# Patient Record
Sex: Male | Born: 1960 | Race: White | Hispanic: No | Smoking: Never smoker
Health system: Southern US, Community
[De-identification: ages and names within clinical notes are randomized; demographics above are authoritative.]

## PROBLEM LIST (undated history)

## (undated) DIAGNOSIS — Z6841 Body Mass Index (BMI) 40.0 and over, adult: Secondary | ICD-10-CM

## (undated) DIAGNOSIS — Z8489 Family history of other specified conditions: Secondary | ICD-10-CM

## (undated) DIAGNOSIS — Z87442 Personal history of urinary calculi: Secondary | ICD-10-CM

## (undated) DIAGNOSIS — M1990 Unspecified osteoarthritis, unspecified site: Secondary | ICD-10-CM

## (undated) DIAGNOSIS — L0390 Cellulitis, unspecified: Secondary | ICD-10-CM

## (undated) DIAGNOSIS — E119 Type 2 diabetes mellitus without complications: Secondary | ICD-10-CM

## (undated) DIAGNOSIS — I82409 Acute embolism and thrombosis of unspecified deep veins of unspecified lower extremity: Secondary | ICD-10-CM

## (undated) DIAGNOSIS — R202 Paresthesia of skin: Secondary | ICD-10-CM

## (undated) DIAGNOSIS — R011 Cardiac murmur, unspecified: Secondary | ICD-10-CM

## (undated) DIAGNOSIS — G4730 Sleep apnea, unspecified: Secondary | ICD-10-CM

## (undated) DIAGNOSIS — R200 Anesthesia of skin: Secondary | ICD-10-CM

## (undated) DIAGNOSIS — D649 Anemia, unspecified: Secondary | ICD-10-CM

## (undated) DIAGNOSIS — C801 Malignant (primary) neoplasm, unspecified: Secondary | ICD-10-CM

## (undated) DIAGNOSIS — J189 Pneumonia, unspecified organism: Secondary | ICD-10-CM

## (undated) DIAGNOSIS — M2550 Pain in unspecified joint: Secondary | ICD-10-CM

## (undated) HISTORY — PX: TONSILLECTOMY: SUR1361

## (undated) HISTORY — PX: JOINT REPLACEMENT: SHX530

## (undated) HISTORY — DX: Malignant (primary) neoplasm, unspecified: C80.1

## (undated) HISTORY — PX: BACK SURGERY: SHX140

## (undated) HISTORY — PX: INGUINAL HERNIA REPAIR: SUR1180

## (undated) HISTORY — PX: TOTAL HIP ARTHROPLASTY: SHX124

## (undated) HISTORY — PX: HERNIA REPAIR: SHX51

## (undated) HISTORY — DX: Type 2 diabetes mellitus without complications: E11.9

## (undated) HISTORY — PX: SPINE SURGERY: SHX786

## (undated) HISTORY — PX: COLONOSCOPY: SHX174

---
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
## 1998-05-01 HISTORY — PX: OTHER SURGICAL HISTORY: SHX169

## 2003-05-02 HISTORY — PX: LUMBAR LAMINECTOMY: SHX95

## 2006-11-04 ENCOUNTER — Inpatient Hospital Stay (HOSPITAL_COMMUNITY): Admission: EM | Admit: 2006-11-04 | Discharge: 2006-11-07 | Payer: Self-pay | Admitting: Emergency Medicine

## 2010-09-13 NOTE — Discharge Summary (Signed)
Henry Hinton, Henry Hinton                ACCOUNT NO.:  [id number]   MEDICAL RECORD NO.:  [id number]          PATIENT TYPE:  INP   LOCATION:  5037                         FACILITY:  [facility]   PHYSICIAN:  Hind I Elsaid, MD      DATE OF BIRTH:  01-17-1961   DATE OF ADMISSION:  11/04/2006  DATE OF DISCHARGE:  11/07/2006                               DISCHARGE SUMMARY   DISCHARGE DIAGNOSES:  1. Left leg cellulitis.  2. Status post right hip placement.  3. L4-L5 laminectomy in February of 2005.  4. Sleep apnea on CPAP.   DISCHARGE MEDICATIONS:  1. Keflex 500 mg p.o. q.12 hours.  2. Oxycodone 5 to 10 mg p.o. q.6 hours p.r.n.  3. Ferrous sulfate 325 mg p.o. b.i.d.  4. Aspirin 81 mg p.o. daily.   PROCEDURES:  None.   CONSULTATIONS:  None.   BRIEF HISTORY:  You can review the history done by Dr. Karilyn Cota at the  date of admission on November 04, 2006 where patient admitted for left leg  redness and inflammation.   HOSPITAL COURSE:  1. For left leg cellulitis, patient was admitted, started on IV      antibiotics and Lovenox.  PT was consulted and OT where they      recommend patient to continue his home physical activity.  During      hospitalization, left leg cellulitis improved significantly.  We      think patient can be discharged home with Keflex, and patient was      advised to follow up with his primary care; if further swelling,      erythema, back to the hospital.  2. Sleep apnea.  To continue his sleep CPAP machine.   VITAL SIGNS ON DISCHARGE:  Temperature 97.5, pulse rate 74, blood  pressure 118/61.   Sodium of 141, potassium of 4.3, chloride 108, bicarb of 28, BUN of 12  and creatinine of 0.5.  White blood cells of 5.3, hemoglobin of 9.3,  hematocrit 28 and platelets 232.  Hemoglobin A1c was 5.6.   We think patient is medically stable for discharge home and follow with  his primary care as needed.     [name], MD  Electronically Signed    HIE/MEDQ  D:  11/07/2006  T:   11/07/2006  Job:  [id number]

## 2010-09-13 NOTE — H&P (Signed)
NAME:  Henry Hinton, Henry Hinton NO.:  [id number]   MEDICAL RECORD NO.:  [id number]          PATIENT TYPE:  EMS   LOCATION:  MAJO                         FACILITY:  [facility]   PHYSICIAN:  Wilson Singer, M.D.DATE OF BIRTH:  February 12, 1961   DATE OF ADMISSION:  11/04/2006  DATE OF DISCHARGE:                              HISTORY & PHYSICAL   HISTORY OF PRESENT ILLNESS:  This is a very pleasant 50 year old man who  complains of left leg redness and inflammation which started today.  He  also has had a low-grade fever while he was in the emergency room.  He  denies any insect bite or anything else he noticed.  Apparently he has  had cellulitis previously on his legs.  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   PAST MEDICAL HISTORY:  No serious illnesses.  Sleep apnea for which he  has a CPAP machine.   PAST SURGICAL HISTORY:  Right total hip replacement as mentioned above.  L4, L5 laminectomy in February 2005.  Left wrist tendonectomy in 2001.  Tonsillectomy as a child.   SOCIAL HISTORY:  He is separated and lives alone.  He is currently  living with his parents apparently.  He does not smoke, and he  occasionally drinks alcohol.  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   MEDICATIONS:  Colace 100 mg b.i.d., oxycodone 5 to 10 mg approximately  twice a day as needed, ferrous sulfate 325 mg b.i.d., aspirin 81 mg  daily.   ALLERGIES:  NONE KNOWN.   FAMILY HISTORY:  Noncontributory.   REVIEW OF SYSTEMS:  Apart from the symptoms mentioned above, there are  no other symptoms referable to all systems reviewed.   PHYSICAL EXAMINATION:  VITAL SIGNS:  Temperature 100.5, blood pressure  132/78, pulse 80, respiration rate 12, saturation 100%.  GENERAL:  He is morbidly obese.  He is nontoxic.  CARDIOVASCULAR:  Heart sounds are present and normal without murmurs.  RESPIRATORY:  Lung  fields are clear.  ABDOMEN:  Soft, nontender with no hepatosplenomegaly.  NEUROLOGIC:  He is alert and oriented with no focal neurological signs.  EXTREMITIES:  Left leg appears to show a spreading type of cellulitis on  the left medial aspect of the leg around the left knee joint area.  It  is located above and below the knee joint on the medial aspect.  The  mother, who came with him, was concerned that it was spreading.   INVESTIGATIONS:  Sodium 138, potassium 3.9, chloride 106, BUN 17,  creatinine 0.8, glucose 102.  Hemoglobin 10.3, white blood cell count  7.4, platelets 283.   IMPRESSION:  1. Left leg cellulitis.  2. Status post right total hip replacement.  3. Sleep apnea.   PLAN:  1. Admit.  2. Intravenous antibiotics.  3. Lovenox and physical therapy which he had been having in      rehabilitation after his right hip surgery.  We will continue this.  4. CPAP machine for his sleep  apnea which he will obtain from home and      use while in the hospital.  5. Further recommendations will depend on the patient's hospital      progress.      Wilson Singer, M.D.  Electronically Signed     NCG/MEDQ  D:  11/04/2006  T:  11/04/2006  Job:  [id number]

## 2011-02-14 LAB — I-STAT 8, (EC8 V) (CONVERTED LAB)
BUN: 17
Bicarbonate: 26.2 — ABNORMAL HIGH
Glucose, Bld: 102 — ABNORMAL HIGH
Hemoglobin: 10.9 — ABNORMAL LOW
Sodium: 138
TCO2: 27
pCO2, Ven: 41.8 — ABNORMAL LOW
pH, Ven: 7.405 — ABNORMAL HIGH

## 2011-02-14 LAB — DIFFERENTIAL
Basophils Absolute: 0.1
Basophils Relative: 1
Eosinophils Absolute: 0.1
Eosinophils Relative: 2
Monocytes Absolute: 0.5
Monocytes Relative: 7
Neutro Abs: 4.9

## 2011-02-14 LAB — COMPREHENSIVE METABOLIC PANEL
AST: 16
Albumin: 2.4 — ABNORMAL LOW
Alkaline Phosphatase: 100
BUN: 11
CO2: 29
Chloride: 105
GFR calc non Af Amer: >60
Potassium: 4.2
Total Bilirubin: 0.4

## 2011-02-14 LAB — CBC
HCT: 26.9 — ABNORMAL LOW
HCT: 30.7 — ABNORMAL LOW
Hemoglobin: 10.3 — ABNORMAL LOW
MCHC: 33.4
MCV: 82.8
MCV: 82.8
Platelets: 222
Platelets: 232
RBC: 3.25 — ABNORMAL LOW
RBC: 3.71 — ABNORMAL LOW
RDW: 14.1 — ABNORMAL HIGH
RDW: 14.7 — ABNORMAL HIGH
WBC: 5.3
WBC: 5.4

## 2011-02-14 LAB — BASIC METABOLIC PANEL
BUN: 12
Calcium: 8.2 — ABNORMAL LOW
GFR calc non Af Amer: >60
Glucose, Bld: 111 — ABNORMAL HIGH

## 2011-02-14 LAB — POCT I-STAT CREATININE: Creatinine, Ser: 0.8

## 2011-04-19 ENCOUNTER — Ambulatory Visit (INDEPENDENT_AMBULATORY_CARE_PROVIDER_SITE_OTHER): Payer: BC Managed Care – PPO

## 2011-04-19 DIAGNOSIS — J111 Influenza due to unidentified influenza virus with other respiratory manifestations: Secondary | ICD-10-CM

## 2011-04-19 DIAGNOSIS — D649 Anemia, unspecified: Secondary | ICD-10-CM

## 2012-01-12 ENCOUNTER — Encounter: Payer: Self-pay | Admitting: Family Medicine

## 2012-01-12 ENCOUNTER — Ambulatory Visit (INDEPENDENT_AMBULATORY_CARE_PROVIDER_SITE_OTHER): Payer: BC Managed Care – PPO | Admitting: Family Medicine

## 2012-01-12 VITALS — BP 130/73 | HR 65 | Temp 97.8°F | Resp 16 | Ht 68.0 in | Wt 382.6 lb

## 2012-01-12 DIAGNOSIS — R252 Cramp and spasm: Secondary | ICD-10-CM

## 2012-01-12 DIAGNOSIS — R030 Elevated blood-pressure reading, without diagnosis of hypertension: Secondary | ICD-10-CM

## 2012-01-12 DIAGNOSIS — G4733 Obstructive sleep apnea (adult) (pediatric): Secondary | ICD-10-CM

## 2012-01-12 DIAGNOSIS — R609 Edema, unspecified: Secondary | ICD-10-CM

## 2012-01-12 DIAGNOSIS — R600 Localized edema: Secondary | ICD-10-CM

## 2012-01-12 MED ORDER — POTASSIUM CHLORIDE ER 10 MEQ PO TBCR: 10.0000 meq | EXTENDED_RELEASE_TABLET | Freq: Every day | ORAL | Status: DC

## 2012-01-12 MED ORDER — FUROSEMIDE 40 MG PO TABS: 40.0000 mg | ORAL_TABLET | Freq: Every day | ORAL | Status: DC

## 2012-01-12 NOTE — Progress Notes (Signed)
  Subjective:    Patient ID: Henry Quam., male    DOB: [birth year], 51 y.o.   MRN: [id number]  HPI Henry Hinton. is a 51 y.o. male  Takes Lasix 40mg  qd.  No known hx HTN or CHF. Takes this for swelling in legs, takes potassium due to Lasix. Treated by Dr. Sudie Bailey in [place].  Last ov in April.  Transferring care.  Just filled out release of info today. Unknown why fluid sits in legs.  Has been on lasix for 5 years. No cardiologist.   Obesity - deep water exercise in pool every day.  ATC.  Usually weighs 362.  NO shortness of breath.  Does not feel more swelling than usual.  Cramping at night - few times per week - various areas of legs - notices if not drinking enough water. Legs feel lighter with exercise.   Usually blood pressure runs lower than here today - usually 120's over low 80's.  No hx of HTN.   Hx of OSA - uses CPAP.    Father passed away 10/05/2022- prostate cancer.  ATC for [facility].    Review of Systems  Constitutional: Negative for fever and chills.  Respiratory: Negative for chest tightness and shortness of breath.   Cardiovascular: Positive for leg swelling (chronic - less now than usual.). Negative for chest pain.  Musculoskeletal: Positive for myalgias.       Objective:   Physical Exam  Constitutional: He is oriented to person, place, and time. He appears well-developed and well-nourished.       overweight  HENT:  Head: Normocephalic and atraumatic.  Eyes: EOM are normal. Pupils are equal, round, and reactive to light.  Neck: No JVD present. Carotid bruit is not present.  Cardiovascular: Normal rate, regular rhythm and normal heart sounds.   No murmur heard. Pulmonary/Chest: Effort normal and breath sounds normal. He has no wheezes. He has no rales.  Musculoskeletal: He exhibits edema (le bilaterally - nonpitting. ).  Neurological: He is alert and oriented to person, place, and time.  Skin: Skin is warm and dry.  Psychiatric: He has a normal mood and  affect. His behavior is normal.        Assessment & Plan:  Henry Hinton. is a 51 y.o. male 1. Leg edema  furosemide (LASIX) 40 MG tablet, potassium chloride (K-DUR) 10 MEQ tablet  2. Muscle cramps  Basic metabolic panel  3. OSA (obstructive sleep apnea)      Recheck BP in normal range.  Release of info completed for previous records. Refilled lasix and k dur.  episodic myalgias - check BMP.  Maintain adequate hydration.

## 2012-01-12 NOTE — Patient Instructions (Signed)
Keep a record of your blood pressures outside of the office and bring them to the next office visit. We will try to obtain your prior records to review at the next office visit. Your should receive a call or letter about your lab results within the next week to 10 days.  Follow up in the next 1 month.

## 2012-01-13 LAB — BASIC METABOLIC PANEL
CO2: 26 mEq/L (ref 19–32)
Calcium: 8.8 mg/dL (ref 8.4–10.5)
Chloride: 104 mEq/L (ref 96–112)
Glucose, Bld: 90 mg/dL (ref 70–99)
Potassium: 4.2 mEq/L (ref 3.5–5.3)
Sodium: 138 mEq/L (ref 135–145)

## 2012-02-05 ENCOUNTER — Ambulatory Visit (INDEPENDENT_AMBULATORY_CARE_PROVIDER_SITE_OTHER): Payer: BC Managed Care – PPO | Admitting: Family Medicine

## 2012-02-05 ENCOUNTER — Encounter: Payer: Self-pay | Admitting: Family Medicine

## 2012-02-05 VITALS — BP 120/84 | HR 67 | Temp 98.2°F | Resp 16 | Ht 67.0 in | Wt 378.0 lb

## 2012-02-05 DIAGNOSIS — E669 Obesity, unspecified: Secondary | ICD-10-CM

## 2012-02-05 DIAGNOSIS — G4733 Obstructive sleep apnea (adult) (pediatric): Secondary | ICD-10-CM

## 2012-02-05 DIAGNOSIS — R252 Cramp and spasm: Secondary | ICD-10-CM

## 2012-02-05 NOTE — Progress Notes (Signed)
  Subjective:    Patient ID: Henry Quam., male    DOB: 1960/07/02, 51 y.o.   MRN: [id number]  HPI Henry Kardell. is a 52 y.o. male  New pt as of 01/12/12. Prior pt of Dr. Sudie Bailey in [place]. ATC for [facility].   Hx of leg edema. Takes Lasix 40mg  qd. No known hx HTN or CHF.  takes potassium due to Lasix.  Has been on lasix for 5 years. No cardiologist. cramps in legs at last ov, but bmp in normal range. Cramping at night - few times per week - various areas of legs - notices if not drinking enough water. Legs feel lighter with exercise.  BP still in 120' over 80's.  Doing better - drinking glass of water each night. Not having cramps now.   Obesity - deep water exercise in pool every day. ATC. Usually weighs 362. NO shortness of breath. Does not feel more swelling than usual. Not eating over 1500 calories each day.  Running in pool.  Feels like clothing is looser.  Low 360's at home.  Goal of 300 next summer. Then goal of mid 200's by retirement.  May have had thyroid check few years ago.  Not cold natured.  No change in dryness of skin or hair falling out. Has looked into gastric bypass in past. Rather change eating habits now, may be amenable to this in few years.  Feels like more active now than few years ago.    OSA -- uses cpap.  Initial bp high last ov, but wnl on recheck.    Review of Systems  Constitutional: Negative for fever, chills and unexpected weight change.  Respiratory: Negative for chest tightness and shortness of breath.   Cardiovascular: Negative for chest pain (sore in muscles after workouts only. ).  Skin: Negative for color change.       Objective:   Physical Exam  Constitutional: He is oriented to person, place, and time. He appears well-developed and well-nourished.       Overweight/obese.  Neck: No thyromegaly present.  Cardiovascular: Normal rate, regular rhythm, normal heart sounds and intact distal pulses.   Pulmonary/Chest: Effort normal and breath  sounds normal. No respiratory distress. He has no wheezes. He has no rales.  Neurological: He is alert and oriented to person, place, and time.  Skin: Skin is warm and dry.  Psychiatric: He has a normal mood and affect. His behavior is normal.          Assessment & Plan:  Henry Hinton. is a 51 y.o. male 1. Obesity  TSH  2. Muscle cramps    3. OSA on CPAP      Obesity - working on diet and exercise.  Doubt hypothyroid, but with trouble with weight loss will check tsh.  Discussed nutritionist - declined at present but may be open to this after 1st of the year.   Myalgias - resolved.  Continue to maintain adequate hydration.  OSA - stable.  BP looks ok today.   Patient Instructions  Your should receive a call or letter about your lab results within the next week to 10 days.  Follow up in about 3 months Return to the clinic or go to the nearest emergency room if any of your symptoms worsen or new symptoms occur.

## 2012-02-05 NOTE — Patient Instructions (Signed)
Your should receive a call or letter about your lab results within the next week to 10 days.  Follow up in about 3 months Return to the clinic or go to the nearest emergency room if any of your symptoms worsen or new symptoms occur.

## 2012-02-06 LAB — TSH: TSH: 1.013 u[IU]/mL (ref 0.350–4.500)

## 2012-04-08 ENCOUNTER — Ambulatory Visit (INDEPENDENT_AMBULATORY_CARE_PROVIDER_SITE_OTHER): Payer: BC Managed Care – PPO | Admitting: Internal Medicine

## 2012-04-08 VITALS — BP 124/82 | HR 76 | Temp 97.7°F | Resp 20 | Ht 69.0 in | Wt 379.6 lb

## 2012-04-08 DIAGNOSIS — Z96649 Presence of unspecified artificial hip joint: Secondary | ICD-10-CM

## 2012-04-08 DIAGNOSIS — R609 Edema, unspecified: Secondary | ICD-10-CM

## 2012-04-08 DIAGNOSIS — R300 Dysuria: Secondary | ICD-10-CM

## 2012-04-08 LAB — POCT UA - MICROSCOPIC ONLY: Yeast, UA: NEGATIVE

## 2012-04-08 LAB — POCT URINALYSIS DIPSTICK
Leukocytes, UA: NEGATIVE
Nitrite, UA: NEGATIVE
Protein, UA: NEGATIVE
Urobilinogen, UA: 0.2

## 2012-04-08 MED ORDER — NITROFURANTOIN MONOHYD MACRO 100 MG PO CAPS: 100.0000 mg | ORAL_CAPSULE | Freq: Two times a day (BID) | ORAL | Status: DC

## 2012-04-08 NOTE — Progress Notes (Signed)
  Subjective:    Patient ID: Henry Quam., male    DOB: 1960-08-29, 51 y.o.   MRN: [id number]  HPI having dysuria throughout the stream For the past week/no frequency or urgency/nocturia only once/no trouble starting stream/no incomplete void/no history of prostate problems No hematuria no penile discharge/last sexual activity 3 years ago No skin rashes or penile lesions/  No fever or flank pain  Recently moved here to care for  ailing father Swims daily and runs in the water in an attempt to stay in shape  Review of Systems     Objective:   Physical Exam Morbidly obese Vital signs otherwise stable No flank tenderness No abdominal tenderness No scrotal lesions and no penile lesions/no penile discharge/no irritation at the meatus       Results for orders placed in visit on 04/08/12  POCT URINALYSIS DIPSTICK      Component Value Range   Color, UA yellow     Clarity, UA clear     Glucose, UA neg     Bilirubin, UA neg     Ketones, UA neg     Spec Grav, UA 1.020     Blood, UA neg     pH, UA 7.0     Protein, UA neg     Urobilinogen, UA 0.2     Nitrite, UA neg     Leukocytes, UA Negative    POCT UA - MICROSCOPIC ONLY      Component Value Range   WBC, Ur, HPF, POC 0-1     RBC, urine, microscopic neg     Bacteria, U Microscopic neg     Mucus, UA neg     Epithelial cells, urine per micros 0-1     Crystals, Ur, HPF, POC neg     Casts, Ur, LPF, POC neg     Yeast, UA neg      Assessment & Plan:  Problem #1 dysuria for one week Culture Start Macrobid (he's convinced  there is a low-grade infection) He agrees to further workup if his symptoms do not resolve over the next week

## 2012-04-08 NOTE — Patient Instructions (Signed)
Culture pending

## 2012-04-10 LAB — URINE CULTURE: Colony Count: 5000

## 2012-04-11 ENCOUNTER — Other Ambulatory Visit: Payer: Self-pay | Admitting: Family Medicine

## 2012-07-25 ENCOUNTER — Other Ambulatory Visit: Payer: Self-pay | Admitting: Physician Assistant

## 2012-11-02 ENCOUNTER — Other Ambulatory Visit: Payer: Self-pay | Admitting: Internal Medicine

## 2012-11-05 ENCOUNTER — Other Ambulatory Visit: Payer: Self-pay | Admitting: Internal Medicine

## 2012-11-12 ENCOUNTER — Encounter: Payer: Self-pay | Admitting: Sports Medicine

## 2012-11-12 ENCOUNTER — Ambulatory Visit (INDEPENDENT_AMBULATORY_CARE_PROVIDER_SITE_OTHER): Payer: BC Managed Care – PPO | Admitting: Sports Medicine

## 2012-11-12 ENCOUNTER — Other Ambulatory Visit: Payer: Self-pay | Admitting: Sports Medicine

## 2012-11-12 ENCOUNTER — Ambulatory Visit
Admission: RE | Admit: 2012-11-12 | Discharge: 2012-11-12 | Disposition: A | Discharge status: Home / Self Care | Payer: BC Managed Care – PPO | Source: Ambulatory Visit | Attending: Sports Medicine | Admitting: Sports Medicine

## 2012-11-12 VITALS — BP 144/99 | HR 62 | Ht 69.5 in | Wt 362.0 lb

## 2012-11-12 DIAGNOSIS — M542 Cervicalgia: Secondary | ICD-10-CM

## 2012-11-12 MED ORDER — GABAPENTIN 100 MG PO CAPS: ORAL_CAPSULE | ORAL | Status: DC

## 2012-11-12 MED ORDER — PREDNISONE 10 MG PO TABS: ORAL_TABLET | ORAL | Status: DC

## 2012-11-13 NOTE — Progress Notes (Signed)
  Subjective:    Patient ID: Henry Quam., male    DOB: [birth year], 52 y.o.   MRN: [id number]  HPI chief complaint: Right arm and neck pain  Right-hand-dominant 52 year old male comes in today complaining 2 months of right arm pain and numbness. His symptoms began acutely when while stretching his neck he felt a pop along the right side. Shortly afterwards he began to experience numbness in his fourth and fifth fingers and a diffuse ache in the right arm which he traces down the posterior arm into the forearm and hand. The numbness has now moved and involves most of his fingers. The pain is described as a constant dull ache. He is not noticed any significant weakness but does notice that the arm fatigues rather quickly with activity. He's getting some spasm and stiffness along the right side of his neck. He's tried over-the-counter anti-inflammatories without any relief. He denies problems similar to this in the past. No symptoms in the left arm. He notes that raising the arm directly overhead will help alleviate his symptoms. No prior neck surgeries. No deep-seated shoulder pain.  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 Awake alert and oriented x3. Vital signs are reviewed.  Cervical spine: Good cervical mobility in all planes. No tenderness along cervical midline. There is tenderness as well as some mild spasm of the right paraspinal musculature. Positive Spurling to the right. Patient has 4+/5 strength with resisted triceps, wrist flexion, and inner osseous muscle testing on the right compared to 5/5 on the left. No atrophy. Sensation is decreased to light touch along C7 and C8 dermatomes. Good radial and ulnar pulses bilaterally.  X-rays  of his cervical spine and clicking AP and lateral views are reviewed. There is a small anterior spur off the C2 vertebral body but otherwise there appears to be fairly well-preserved disc space. Nothing acute.       Assessment & Plan:  1. 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 6 day Sterapred Dosepak to take history. Neurontin 100 mg to take each bedtime weaning up to 300 mg each bedtime over a period of one week. I will call him with the MRI once available at which point we will delineate further treatment.

## 2012-11-17 ENCOUNTER — Ambulatory Visit
Admission: RE | Admit: 2012-11-17 | Discharge: 2012-11-17 | Disposition: A | Discharge status: Home / Self Care | Payer: BC Managed Care – PPO | Source: Ambulatory Visit | Attending: Sports Medicine | Admitting: Sports Medicine

## 2012-11-17 DIAGNOSIS — M542 Cervicalgia: Secondary | ICD-10-CM

## 2012-11-18 ENCOUNTER — Telehealth: Payer: Self-pay | Admitting: Sports Medicine

## 2012-11-18 NOTE — Telephone Encounter (Signed)
I spoke with the patient on the phone today regarding MRI findings of the cervical spine. 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 There is a caudally extruded component which also causes some mild spinal stenosis with mild spinal cord mass effect but no spinal cord signal abnormality. Based on these findings I think the patient would be best served by a neurosurgeon. I will try to get him into see Dr. Danielle Dess. I'll defer further treatment to Dr. Verlee Rossetti discretion.

## 2012-12-31 ENCOUNTER — Other Ambulatory Visit: Payer: Self-pay | Admitting: Neurological Surgery

## 2013-01-14 ENCOUNTER — Encounter (HOSPITAL_COMMUNITY): Payer: Self-pay | Admitting: Pharmacy Technician

## 2013-01-15 ENCOUNTER — Encounter (HOSPITAL_COMMUNITY): Payer: Self-pay

## 2013-01-15 ENCOUNTER — Encounter (HOSPITAL_COMMUNITY)
Admission: RE | Admit: 2013-01-15 | Discharge: 2013-01-15 | Disposition: A | Discharge status: Home / Self Care | Payer: BC Managed Care – PPO | Source: Ambulatory Visit | Attending: Neurological Surgery | Admitting: Neurological Surgery

## 2013-01-15 DIAGNOSIS — Z01812 Encounter for preprocedural laboratory examination: Secondary | ICD-10-CM | POA: Insufficient documentation

## 2013-01-15 DIAGNOSIS — Z01818 Encounter for other preprocedural examination: Secondary | ICD-10-CM | POA: Insufficient documentation

## 2013-01-15 HISTORY — DX: Cardiac murmur, unspecified: R01.1

## 2013-01-15 HISTORY — DX: Paresthesia of skin: R20.2

## 2013-01-15 HISTORY — DX: Family history of other specified conditions: Z84.89

## 2013-01-15 HISTORY — DX: Pain in unspecified joint: M25.50

## 2013-01-15 HISTORY — DX: Pneumonia, unspecified organism: J18.9

## 2013-01-15 HISTORY — DX: Anemia, unspecified: D64.9

## 2013-01-15 HISTORY — DX: Sleep apnea, unspecified: G47.30

## 2013-01-15 HISTORY — DX: Unspecified osteoarthritis, unspecified site: M19.90

## 2013-01-15 HISTORY — DX: Paresthesia of skin: R20.0

## 2013-01-15 LAB — BASIC METABOLIC PANEL
BUN: 22 mg/dL (ref 6–23)
Calcium: 9.5 mg/dL (ref 8.4–10.5)
Creatinine, Ser: 0.75 mg/dL (ref 0.50–1.35)
GFR calc Af Amer: >90 mL/min (ref >90)
GFR calc non Af Amer: >90 mL/min (ref >90)
Potassium: 4.9 mEq/L (ref 3.5–5.1)

## 2013-01-15 LAB — SURGICAL PCR SCREEN: Staphylococcus aureus: NEGATIVE

## 2013-01-15 LAB — CBC
HCT: 42.6 % (ref 39.0–52.0)
MCHC: 33.8 g/dL (ref 30.0–36.0)
MCV: 86.4 fL (ref 78.0–100.0)
RDW: 14.2 % (ref 11.5–15.5)

## 2013-01-15 NOTE — Progress Notes (Signed)
Pt denies SOB, chest pain, and being under the care of a cardiologist. Pt denies having a stress test, echo, and cardiac cath. Pt made aware to bring in CPAP mask on DOS along with CPAP settings. Pt denies having a cardiac history and cardiac valve replacement.

## 2013-01-15 NOTE — Pre-Procedure Instructions (Addendum)
Henry Hinton.  01/15/2013   Your procedure is scheduled on: Tuesday, January 21, 2013  Report to [facility] at 7:30 AM.  Call this number if you have problems the morning of surgery: [phone]   Remember:   Do not eat food or drink liquids after midnight.   Take these medicines the morning of surgery with A SIP OF WATER: NONE  Stop taking Aspirin,  Multiple Vitamins-Minerals (MENS 50+ MULTI VITAMIN/MIN) TABS   and herbal medications. Do not take any NSAIDs ie: Ibuprofen, Advil, Motrin Naproxen or any medication containing Aspirin.  Do not wear jewelry, make-up or nail polish.  Do not wear lotions, powders, or perfumes. You may wear deodorant.  Do not shave 48 hours prior to surgery. Men may shave face and neck.  Do not bring valuables to the hospital.  [facility] is not responsible for any belongings or valuables.  Contacts, dentures or bridgework may not be worn into surgery.  Leave suitcase in the car. After surgery it may be brought to your room.  For patients admitted to the hospital, checkout time is 11:00 AM the day of discharge.   Patients discharged the day of surgery will not be allowed to drive home.  Name and phone number of your driver:   Special Instructions: Shower using CHG 2 nights before surgery and the night before surgery.  If you shower the day of surgery use CHG.  Use special wash - you have one bottle of CHG for all showers.  You should use approximately 1/3 of the bottle for each shower.   Please read over the following fact sheets that you were given: Pain Booklet, Coughing and Deep Breathing, MRSA Information and Surgical Site Infection Prevention

## 2013-01-16 NOTE — Progress Notes (Addendum)
Left a message on the voicemail of Shanda Bumps, RN ( surgical scheduler for Dr. Danielle Dess) regarding pt abnormal labs ( WBC 13.4).

## 2013-01-20 MED ORDER — DEXTROSE 5 % IV SOLN
3.0000 g | INTRAVENOUS | Status: AC
  Administered 2013-01-21: 3 g via INTRAVENOUS
  Filled 2013-01-20: qty 3000

## 2013-01-21 ENCOUNTER — Encounter (HOSPITAL_COMMUNITY): Payer: Self-pay | Admitting: Anesthesiology

## 2013-01-21 ENCOUNTER — Ambulatory Visit (HOSPITAL_COMMUNITY)
Admission: RE | Admit: 2013-01-21 | Discharge: 2013-01-21 | Disposition: A | Discharge status: Home / Self Care | Payer: BC Managed Care – PPO | Source: Ambulatory Visit | Attending: Neurological Surgery | Admitting: Neurological Surgery

## 2013-01-21 ENCOUNTER — Ambulatory Visit (HOSPITAL_COMMUNITY): Payer: BC Managed Care – PPO

## 2013-01-21 ENCOUNTER — Encounter (HOSPITAL_COMMUNITY)
Admission: RE | Disposition: A | Discharge status: Home / Self Care | Payer: Self-pay | Source: Ambulatory Visit | Attending: Neurological Surgery

## 2013-01-21 ENCOUNTER — Ambulatory Visit (HOSPITAL_COMMUNITY): Payer: BC Managed Care – PPO | Admitting: Anesthesiology

## 2013-01-21 ENCOUNTER — Encounter (HOSPITAL_COMMUNITY): Payer: Self-pay | Admitting: *Deleted

## 2013-01-21 DIAGNOSIS — Z96649 Presence of unspecified artificial hip joint: Secondary | ICD-10-CM | POA: Insufficient documentation

## 2013-01-21 DIAGNOSIS — M5020 Other cervical disc displacement, unspecified cervical region: Secondary | ICD-10-CM | POA: Insufficient documentation

## 2013-01-21 HISTORY — PX: POSTERIOR CERVICAL LAMINECTOMY WITH MET- RX: SHX6035

## 2013-01-21 SURGERY — POSTERIOR CERVICAL LAMINECTOMY WITH MET- RX
Anesthesia: General | Site: Neck | Laterality: Right | Wound class: Clean

## 2013-01-21 MED ORDER — PROPOFOL 10 MG/ML IV BOLUS
INTRAVENOUS | Status: DC | PRN
  Administered 2013-01-21: 200 mg via INTRAVENOUS

## 2013-01-21 MED ORDER — PHENOL 1.4 % MT LIQD: 1.0000 | OROMUCOSAL | Status: DC | PRN

## 2013-01-21 MED ORDER — GLYCOPYRROLATE 0.2 MG/ML IJ SOLN
INTRAMUSCULAR | Status: DC | PRN
  Administered 2013-01-21: .8 mg via INTRAVENOUS

## 2013-01-21 MED ORDER — OXYCODONE-ACETAMINOPHEN 5-325 MG PO TABS: 1.0000 | ORAL_TABLET | ORAL | Status: DC | PRN

## 2013-01-21 MED ORDER — LIDOCAINE HCL (CARDIAC) 20 MG/ML IV SOLN
INTRAVENOUS | Status: DC | PRN
  Administered 2013-01-21: 100 mg via INTRAVENOUS

## 2013-01-21 MED ORDER — LIDOCAINE-EPINEPHRINE 1 %-1:100000 IJ SOLN
INTRAMUSCULAR | Status: DC | PRN
  Administered 2013-01-21: 5 mL

## 2013-01-21 MED ORDER — BUPIVACAINE HCL (PF) 0.5 % IJ SOLN
INTRAMUSCULAR | Status: DC | PRN
  Administered 2013-01-21: 5 mL

## 2013-01-21 MED ORDER — HEMOSTATIC AGENTS (NO CHARGE) OPTIME
TOPICAL | Status: DC | PRN
  Administered 2013-01-21: 1 via TOPICAL

## 2013-01-21 MED ORDER — BISACODYL 10 MG RE SUPP: 10.0000 mg | Freq: Every day | RECTAL | Status: DC | PRN

## 2013-01-21 MED ORDER — MIDAZOLAM HCL 5 MG/5ML IJ SOLN
INTRAMUSCULAR | Status: DC | PRN
  Administered 2013-01-21: 2 mg via INTRAVENOUS

## 2013-01-21 MED ORDER — THROMBIN 5000 UNITS EX SOLR
CUTANEOUS | Status: DC | PRN
  Administered 2013-01-21 (×2): 5000 [IU] via TOPICAL

## 2013-01-21 MED ORDER — NEOSTIGMINE METHYLSULFATE 1 MG/ML IJ SOLN
INTRAMUSCULAR | Status: DC | PRN
  Administered 2013-01-21: 5 mg via INTRAVENOUS

## 2013-01-21 MED ORDER — SENNA 8.6 MG PO TABS
1.0000 | ORAL_TABLET | Freq: Two times a day (BID) | ORAL | Status: DC
  Administered 2013-01-21: 8.6 mg via ORAL
  Filled 2013-01-21: qty 1

## 2013-01-21 MED ORDER — POTASSIUM CHLORIDE ER 10 MEQ PO TBCR
10.0000 meq | EXTENDED_RELEASE_TABLET | Freq: Every day | ORAL | Status: DC
Start: 2013-01-21 — End: 2013-01-21
  Administered 2013-01-21: 10 meq via ORAL
  Filled 2013-01-21: qty 1

## 2013-01-21 MED ORDER — ALUM & MAG HYDROXIDE-SIMETH 200-200-20 MG/5ML PO SUSP: 30.0000 mL | Freq: Four times a day (QID) | ORAL | Status: DC | PRN

## 2013-01-21 MED ORDER — 0.9 % SODIUM CHLORIDE (POUR BTL) OPTIME
TOPICAL | Status: DC | PRN
  Administered 2013-01-21: 1000 mL

## 2013-01-21 MED ORDER — THROMBIN 5000 UNITS EX SOLR
OROMUCOSAL | Status: DC | PRN
  Administered 2013-01-21: 09:00:00 via TOPICAL

## 2013-01-21 MED ORDER — OXYCODONE HCL 5 MG PO TABS
5.0000 mg | ORAL_TABLET | Freq: Once | ORAL | Status: AC | PRN
  Administered 2013-01-21: 5 mg via ORAL

## 2013-01-21 MED ORDER — SODIUM CHLORIDE 0.9 % IV SOLN
10.0000 mg | INTRAVENOUS | Status: DC | PRN
  Administered 2013-01-21: 30 ug/min via INTRAVENOUS

## 2013-01-21 MED ORDER — HYDROMORPHONE HCL PF 1 MG/ML IJ SOLN
INTRAMUSCULAR | Status: AC
  Filled 2013-01-21: qty 1

## 2013-01-21 MED ORDER — POLYETHYLENE GLYCOL 3350 17 G PO PACK
17.0000 g | PACK | Freq: Every day | ORAL | Status: DC | PRN
  Filled 2013-01-21: qty 1

## 2013-01-21 MED ORDER — ARTIFICIAL TEARS OP OINT
TOPICAL_OINTMENT | OPHTHALMIC | Status: DC | PRN
  Administered 2013-01-21: 1 via OPHTHALMIC

## 2013-01-21 MED ORDER — METHOCARBAMOL 500 MG PO TABS
500.0000 mg | ORAL_TABLET | Freq: Four times a day (QID) | ORAL | Status: DC | PRN
  Administered 2013-01-21: 500 mg via ORAL

## 2013-01-21 MED ORDER — PHENYLEPHRINE HCL 10 MG/ML IJ SOLN
INTRAMUSCULAR | Status: DC | PRN
  Administered 2013-01-21 (×5): 80 ug via INTRAVENOUS
  Administered 2013-01-21: 120 ug via INTRAVENOUS
  Administered 2013-01-21 (×2): 80 ug via INTRAVENOUS

## 2013-01-21 MED ORDER — MORPHINE SULFATE 2 MG/ML IJ SOLN
1.0000 mg | INTRAMUSCULAR | Status: DC | PRN
  Administered 2013-01-21: 4 mg via INTRAVENOUS
  Filled 2013-01-21: qty 2

## 2013-01-21 MED ORDER — MENTHOL 3 MG MT LOZG: 1.0000 | LOZENGE | OROMUCOSAL | Status: DC | PRN

## 2013-01-21 MED ORDER — METHOCARBAMOL 500 MG PO TABS
ORAL_TABLET | ORAL | Status: AC
  Filled 2013-01-21: qty 1

## 2013-01-21 MED ORDER — LACTATED RINGERS IV SOLN: INTRAVENOUS | Status: DC

## 2013-01-21 MED ORDER — KETOROLAC TROMETHAMINE 15 MG/ML IJ SOLN
15.0000 mg | Freq: Four times a day (QID) | INTRAMUSCULAR | Status: DC
  Administered 2013-01-21: 15 mg via INTRAVENOUS
  Filled 2013-01-21: qty 1

## 2013-01-21 MED ORDER — LACTATED RINGERS IV SOLN
INTRAVENOUS | Status: DC | PRN
  Administered 2013-01-21: 07:00:00 via INTRAVENOUS

## 2013-01-21 MED ORDER — FUROSEMIDE 40 MG PO TABS
40.0000 mg | ORAL_TABLET | Freq: Every day | ORAL | Status: DC
  Filled 2013-01-21: qty 1

## 2013-01-21 MED ORDER — FENTANYL CITRATE 0.05 MG/ML IJ SOLN
INTRAMUSCULAR | Status: DC | PRN
  Administered 2013-01-21: 100 ug via INTRAVENOUS
  Administered 2013-01-21: 50 ug via INTRAVENOUS
  Administered 2013-01-21: 100 ug via INTRAVENOUS
  Administered 2013-01-21: 50 ug via INTRAVENOUS

## 2013-01-21 MED ORDER — HYDROMORPHONE HCL PF 1 MG/ML IJ SOLN
0.2500 mg | INTRAMUSCULAR | Status: DC | PRN
  Administered 2013-01-21 (×2): 0.5 mg via INTRAVENOUS

## 2013-01-21 MED ORDER — MAGNESIUM CITRATE PO SOLN
1.0000 | Freq: Once | ORAL | Status: DC | PRN
  Filled 2013-01-21: qty 296

## 2013-01-21 MED ORDER — METHOCARBAMOL 100 MG/ML IJ SOLN
500.0000 mg | Freq: Four times a day (QID) | INTRAVENOUS | Status: DC | PRN
  Filled 2013-01-21: qty 5

## 2013-01-21 MED ORDER — SUCCINYLCHOLINE CHLORIDE 20 MG/ML IJ SOLN
INTRAMUSCULAR | Status: DC | PRN
  Administered 2013-01-21: 100 mg via INTRAVENOUS

## 2013-01-21 MED ORDER — DOCUSATE SODIUM 100 MG PO CAPS: 100.0000 mg | ORAL_CAPSULE | Freq: Every day | ORAL | Status: DC

## 2013-01-21 MED ORDER — ACETAMINOPHEN 325 MG PO TABS: 650.0000 mg | ORAL_TABLET | ORAL | Status: DC | PRN

## 2013-01-21 MED ORDER — OXYCODONE HCL 5 MG/5ML PO SOLN: 5.0000 mg | Freq: Once | ORAL | Status: AC | PRN

## 2013-01-21 MED ORDER — SODIUM CHLORIDE 0.9 % IJ SOLN: 3.0000 mL | Freq: Two times a day (BID) | INTRAMUSCULAR | Status: DC

## 2013-01-21 MED ORDER — ACETAMINOPHEN 650 MG RE SUPP: 650.0000 mg | RECTAL | Status: DC | PRN

## 2013-01-21 MED ORDER — DOCUSATE SODIUM 100 MG PO CAPS
100.0000 mg | ORAL_CAPSULE | Freq: Two times a day (BID) | ORAL | Status: DC
  Administered 2013-01-21: 100 mg via ORAL
  Filled 2013-01-21: qty 1

## 2013-01-21 MED ORDER — DEXAMETHASONE SODIUM PHOSPHATE 4 MG/ML IJ SOLN
INTRAMUSCULAR | Status: DC | PRN
  Administered 2013-01-21: 8 mg via INTRAVENOUS

## 2013-01-21 MED ORDER — SODIUM CHLORIDE 0.9 % IR SOLN
Status: DC | PRN
  Administered 2013-01-21: 09:00:00

## 2013-01-21 MED ORDER — PROMETHAZINE HCL 25 MG/ML IJ SOLN: 6.2500 mg | INTRAMUSCULAR | Status: DC | PRN

## 2013-01-21 MED ORDER — OXYCODONE HCL 5 MG PO TABS
ORAL_TABLET | ORAL | Status: AC
  Filled 2013-01-21: qty 1

## 2013-01-21 MED ORDER — SODIUM CHLORIDE 0.9 % IJ SOLN: 3.0000 mL | INTRAMUSCULAR | Status: DC | PRN

## 2013-01-21 MED ORDER — ONDANSETRON HCL 4 MG/2ML IJ SOLN
INTRAMUSCULAR | Status: DC | PRN
  Administered 2013-01-21: 4 mg via INTRAVENOUS

## 2013-01-21 MED ORDER — DIAZEPAM 5 MG PO TABS: 5.0000 mg | ORAL_TABLET | Freq: Four times a day (QID) | ORAL | Status: DC | PRN

## 2013-01-21 MED ORDER — SODIUM CHLORIDE 0.9 % IV SOLN: 250.0000 mL | INTRAVENOUS | Status: DC

## 2013-01-21 MED ORDER — ONDANSETRON HCL 4 MG/2ML IJ SOLN
4.0000 mg | INTRAMUSCULAR | Status: DC | PRN
  Administered 2013-01-21: 4 mg via INTRAVENOUS
  Filled 2013-01-21: qty 2

## 2013-01-21 MED ORDER — ROCURONIUM BROMIDE 100 MG/10ML IV SOLN
INTRAVENOUS | Status: DC | PRN
  Administered 2013-01-21: 50 mg via INTRAVENOUS
  Administered 2013-01-21: 30 mg via INTRAVENOUS

## 2013-01-21 SURGICAL SUPPLY — 50 items
BAG DECANTER FOR FLEXI CONT (MISCELLANEOUS) ×2 IMPLANT
BLADE SURG 15 STRL LF DISP TIS (BLADE) ×1 IMPLANT
BLADE SURG 15 STRL SS (BLADE) ×1
BLADE SURG ROTATE 9660 (MISCELLANEOUS) IMPLANT
CANISTER SUCTION 2500CC (MISCELLANEOUS) ×2 IMPLANT
CLOTH BEACON ORANGE TIMEOUT ST (SAFETY) ×2 IMPLANT
CONT SPEC 4OZ CLIKSEAL STRL BL (MISCELLANEOUS) ×2 IMPLANT
CORDS BIPOLAR (ELECTRODE) ×2 IMPLANT
DECANTER SPIKE VIAL GLASS SM (MISCELLANEOUS) ×2 IMPLANT
DERMABOND ADHESIVE PROPEN (GAUZE/BANDAGES/DRESSINGS) ×2
DERMABOND ADVANCED (GAUZE/BANDAGES/DRESSINGS) ×2
DERMABOND ADVANCED .7 DNX12 (GAUZE/BANDAGES/DRESSINGS) ×2 IMPLANT
DERMABOND ADVANCED .7 DNX6 (GAUZE/BANDAGES/DRESSINGS) ×2 IMPLANT
DRAPE C-ARM 42X72 X-RAY (DRAPES) IMPLANT
DRAPE LAPAROTOMY 100X72 PEDS (DRAPES) ×2 IMPLANT
DRAPE MICROSCOPE LEICA (MISCELLANEOUS) ×2 IMPLANT
DRAPE POUCH INSTRU U-SHP 10X18 (DRAPES) ×2 IMPLANT
DRAPE SURG IRRIG POUCH 19X23 (DRAPES) ×2 IMPLANT
DURAPREP 6ML APPLICATOR 50/CS (WOUND CARE) ×2 IMPLANT
ELECT BLADE 6.5 EXT (BLADE) ×2 IMPLANT
ELECT REM PT RETURN 9FT ADLT (ELECTROSURGICAL) ×2
ELECTRODE REM PT RTRN 9FT ADLT (ELECTROSURGICAL) ×1 IMPLANT
GAUZE SPONGE 4X4 16PLY XRAY LF (GAUZE/BANDAGES/DRESSINGS) IMPLANT
GLOVE BIOGEL PI IND STRL 8.5 (GLOVE) ×2 IMPLANT
GLOVE BIOGEL PI INDICATOR 8.5 (GLOVE) ×2
GLOVE ECLIPSE 8.5 STRL (GLOVE) ×2 IMPLANT
GLOVE EXAM NITRILE LRG STRL (GLOVE) IMPLANT
GLOVE EXAM NITRILE MD LF STRL (GLOVE) IMPLANT
GLOVE EXAM NITRILE XL STR (GLOVE) IMPLANT
GLOVE EXAM NITRILE XS STR PU (GLOVE) IMPLANT
GOWN BRE IMP SLV AUR LG STRL (GOWN DISPOSABLE) IMPLANT
GOWN BRE IMP SLV AUR XL STRL (GOWN DISPOSABLE) ×2 IMPLANT
GOWN STRL REIN 2XL LVL4 (GOWN DISPOSABLE) ×2 IMPLANT
KIT BASIN OR (CUSTOM PROCEDURE TRAY) ×2 IMPLANT
KIT ROOM TURNOVER OR (KITS) ×2 IMPLANT
NEEDLE HYPO 18GX1.5 BLUNT FILL (NEEDLE) IMPLANT
NEEDLE HYPO 22GX1.5 SAFETY (NEEDLE) ×2 IMPLANT
NEEDLE SPNL 20GX3.5 QUINCKE YW (NEEDLE) ×2 IMPLANT
NS IRRIG 1000ML POUR BTL (IV SOLUTION) ×2 IMPLANT
PACK LAMINECTOMY NEURO (CUSTOM PROCEDURE TRAY) ×2 IMPLANT
PAD ARMBOARD 7.5X6 YLW CONV (MISCELLANEOUS) ×6 IMPLANT
RUBBERBAND STERILE (MISCELLANEOUS) IMPLANT
SPONGE SURGIFOAM ABS GEL SZ50 (HEMOSTASIS) ×2 IMPLANT
SUT VIC AB 3-0 SH 8-18 (SUTURE) ×2 IMPLANT
SYR 20ML ECCENTRIC (SYRINGE) ×2 IMPLANT
SYR 5ML LL (SYRINGE) IMPLANT
TOWEL OR 17X24 6PK STRL BLUE (TOWEL DISPOSABLE) ×2 IMPLANT
TOWEL OR 17X26 10 PK STRL BLUE (TOWEL DISPOSABLE) ×2 IMPLANT
WATER STERILE IRR 1000ML POUR (IV SOLUTION) ×2 IMPLANT
WIRE TIP MIS 2.5MM NEURO (BURR) ×2 IMPLANT

## 2013-01-21 NOTE — Op Note (Signed)
Date of operation: September 20 13,014  Preoperative diagnosis: Herniated nucleus pulposus C6-C7 right with right cervical radiculopathy Post operative diagnosis: Herniated nucleus pulposus C6-C7 right with right cervical radiculopathy Procedure: Microdiscectomy C6-C7 right with operating microscope, Metrix operating system Surgeon: Barnett Abu M.D. Assistant: Maeola Harman M.D. Anesthesia: Gen. endotracheal Indications: Patient is a 52 year old white male whose had significant neck shoulder and right arm pain for the last several months. He was found to have a large extruded fragment of disc on the right side at C6-C7 was initially planned to do an arthroplasty however when this was approved he is now to undergo surgical discectomy via a Metrix procedure and a posterior approach in the sitting position.   Procedure: Patient was brought to the operating room supine on a stretcher having had central venous monitoring catheter in appropriate other lines placed in the preoperative holding area. After the smooth induction of general endotracheal anesthesia the patient was placed in a 3 pin headrest and then carefully placed into the seated position the back of the neck exposed. Positioning was challenging because of the patient's large size being 370 pounds and 5 foot 9 inches in height. He also had bilateral hip arthroplasties and care was taken to place him in the seated position. The head frame was placed across the chest with the arms outside frame appropriate padding was provided for all the bony prominences. Fluoroscopy imaging was brought into the cross table lateral position to view the vertebrae of the cervical spine. Back of the neck was cleansed with alcohol and DuraPrep and draped in a sterile fashion. Localizing radiographs with a small 22-gauge needle identifying the surface anatomy was used to localize the appropriate level. C6-C7 Was identified positively on the radiograph and correlated with  fluoroscopy. Skin in this area was infiltrated with 10 cc of 1% lidocaine mixed 50-50 with half percent Marcaine and 1-100,000 epinephrine. A small vertical incision was created over this area and a K wire was then passed to the interlaminar space of C6-C7. A wanting technique a series of dilators was used to create an opening down to the interlaminar space. Ultimately a 7 centimeter by 20 mm diameter cannula was placed in the wound and fixed to the operating table with a clamp. The operating microscope was brought into the field and through the aperture soft tissues above the interlaminar space of C6-C7 were clean. The inferior margin of the lamina of C6 and the superior margin of the lamina and facet complex of C7 were then removed with 2.2 mm high-speed bur. Soft tissues in this area were then cauterized and divided and this identified the path of the C7 nerve root on the right. This was carefully dissected and by working from underneath the nerve root a significant mass of tissue was identified. When this was incised with a #11 blade under moderate pressure some fragments of this extruded themselves. These were removed with a micropituitary rongeur. Further dissection yielded other fragments of disc which were similarly removed. Dissection over the shoulder of the nerve was then performed and this yielded further fragments of disc. This procedure continued from above and below the exiting nerve root until all fragments were removed. The stasis was then carefully obtained with some small pledgets of Gelfoam soaked in thrombin which were then tear gated away. When adequate decompression was completed the area was inspected for hemostasis yet again and then the endoscopic cannula was removed and the fascia and the lung was closed with 3-0 Vicryl in  interrupted fashion and the skin was closed with 3-0 Vicryl in an inverted interrupted fashion. Dermabond was placed on the skin blood loss was 50 cc.

## 2013-01-21 NOTE — Progress Notes (Signed)
Cline dced cdi, Pcxr done

## 2013-01-21 NOTE — Transfer of Care (Signed)
Immediate Anesthesia Transfer of Care Note  Patient: Henry Hinton.  Procedure(s) Performed: Procedure(s) with comments: Right Sitting C6-7 Microdiskectomy with Met-rex (Right) - Right Sitting C6-7 Microdiskectomy with Met-rex  Patient Location: PACU  Anesthesia Type:General  Level of Consciousness: awake, alert , oriented and patient cooperative  Airway & Oxygen Therapy: Patient Spontanous Breathing and Patient connected to nasal cannula oxygen  Post-op Assessment: Report given to PACU RN, Post -op Vital signs reviewed and stable and Patient moving all extremities X 4  Post vital signs: Reviewed and stable  Complications: No apparent anesthesia complications

## 2013-01-21 NOTE — Anesthesia Preprocedure Evaluation (Addendum)
Anesthesia Evaluation  Patient identified by MRN, date of birth, ID band Patient awake    Reviewed: Allergy & Precautions, H&P , NPO status , Patient's Chart, lab work & pertinent test results  History of Anesthesia Complications Negative for: history of anesthetic complications  Airway Mallampati: II TM Distance: >3 FB Neck ROM: Full    Dental  (+) Teeth Intact and Dental Advisory Given   Pulmonary sleep apnea and Continuous Positive Airway Pressure Ventilation ,    Pulmonary exam normal       Cardiovascular negative cardio ROS      Neuro/Psych negative psych ROS   GI/Hepatic negative GI ROS, Neg liver ROS,   Endo/Other  Morbid obesity  Renal/GU negative Renal ROS     Musculoskeletal   Abdominal   Peds  Hematology negative hematology ROS (+)   Anesthesia Other Findings   Reproductive/Obstetrics                          Anesthesia Physical Anesthesia Plan  ASA: III  Anesthesia Plan: General   Post-op Pain Management:    Induction: Intravenous  Airway Management Planned: Oral ETT  Additional Equipment: CVP  Intra-op Plan:   Post-operative Plan: Extubation in OR  Informed Consent: I have reviewed the patients History and Physical, chart, labs and discussed the procedure including the risks, benefits and alternatives for the proposed anesthesia with the patient or authorized representative who has indicated his/her understanding and acceptance.   Dental advisory given  Plan Discussed with: CRNA, Anesthesiologist and Surgeon  Anesthesia Plan Comments:        Anesthesia Quick Evaluation

## 2013-01-21 NOTE — Preoperative (Signed)
Beta Blockers   Reason not to administer Beta Blockers:Not Applicable 

## 2013-01-21 NOTE — Progress Notes (Signed)
Patient ID: Henry Hinton., male   DOB: August 17, 1960, 52 y.o.   MRN: [id number] Patient is awake and alert. No numbness in the right arm. He feels well.  Is been ambulatory.   incision is clean and dry the back of his neck.  Stable postop

## 2013-01-21 NOTE — Anesthesia Procedure Notes (Signed)
Procedures The patient was identified and consent obtained.  TO was performed, and full barrier precautions were used.  The skin was anesthetized with lidocaine.  Once the vein was located with the 22 ga., the wire was inserted into the vein. The insertion site was dilated and the CVL was carefully inserted and sutured in place. The patient tolerated the procedure well.  CXR was ordered for PACU.Ultrasound guidance: relevant anatomy identified, needle position confirmed, vessel patent, wire seen inside the vessel.  Images printed for the medical record.  Start: 0709 End: 0719 J. Claybon Jabs, MD

## 2013-01-21 NOTE — Anesthesia Postprocedure Evaluation (Signed)
Anesthesia Post Note  Patient: Henry Hinton.  Procedure(s) Performed: Procedure(s) (LRB): Right Sitting C6-7 Microdiskectomy with Met-rex (Right)  Anesthesia type: general  Patient location: PACU  Post pain: Pain level controlled  Post assessment: Patient's Cardiovascular Status Stable  Last Vitals:  Filed Vitals:   01/21/13 1030  BP:   Pulse: 50  Temp:   Resp: 20    Post vital signs: Reviewed and stable  Level of consciousness: sedated  Complications: No apparent anesthesia complications

## 2013-01-21 NOTE — Progress Notes (Signed)
Pt doing very well. Pt given D/C instructions with Rx's, verbal understanding given. Pt D/C'd home via walking @ 1655 per MD order. Rema Fendt, RN

## 2013-01-21 NOTE — H&P (Signed)
Henry Hinton. is an 52 y.o. male.   Chief Complaint: neck and right arm and shoulder pain and weakness HPI: 52 yo with several month hx of persistent pain and weakness of the right upper extremity. Mri shows hnp at C6-7  On right. He has failed efforts at conservative management and despite significant passage of time symptoms persist and seem to be worsening.He has been advised about surgery.  Past Medical History  Diagnosis Date  . Family history of anesthesia complication     " father having delirium after anesthesia"  . Arthritis   . Numbness and tingling     Hx: of in right arm  . Heart murmur     Hx; of as a child  . Joint pain     Hx: of periodically  . Sleep apnea   . Pneumonia   . Kidney stones     Hx; of as a child  . Anemia     Past Surgical History  Procedure Laterality Date  . Lumbar laminectomy      Hx: of 2005  . Joint replacement    . Total hip arthroplasty      Hx: of B/L hips  . Tenosynovitis      Hx: of thumb  . Tonsillectomy    . Colonoscopy      Hx: of    Family History  Problem Relation Age of Onset  . COPD Maternal Grandmother   . Heart disease Maternal Grandfather   . Cancer Paternal Grandfather    Social History:  reports that he has never smoked. He quit smokeless tobacco use about 16 years ago. His smokeless tobacco use included Chew. He reports that  drinks alcohol. He reports that he does not use illicit drugs.  Allergies: No Known Allergies  Medications Prior to Admission  Medication Sig Dispense Refill  . aspirin 325 MG tablet Take 325 mg by mouth daily.      Marland Kitchen docusate sodium (COLACE) 100 MG capsule Take 100 mg by mouth daily.      . ferrous sulfate (IRON SUPPLEMENT) 325 (65 FE) MG tablet Take 325 mg by mouth daily with breakfast.      . furosemide (LASIX) 40 MG tablet Take 40 mg by mouth daily.      Marland Kitchen ibuprofen (ADVIL,MOTRIN) 200 MG tablet Take 400 mg by mouth every 6 (six) hours as needed for pain.      . Multiple  Vitamins-Minerals (MENS 50+ MULTI VITAMIN/MIN) TABS Take 1 tablet by mouth daily.       . potassium chloride (K-DUR) 10 MEQ tablet Take 10 mEq by mouth daily.        No results found for this or any previous visit (from the past 48 hour(s)). No results found.  Review of Systems  Constitutional: Negative.   HENT: Positive for neck pain.   Eyes: Negative.   Respiratory: Negative.   Cardiovascular: Negative.   Gastrointestinal: Negative.   Genitourinary: Negative.   Skin: Negative.   Neurological: Positive for tingling and focal weakness.  Endo/Heme/Allergies: Negative.   Psychiatric/Behavioral: Negative.     Blood pressure 123/60, pulse 65, temperature 97.2 F (36.2 C), temperature source Oral, resp. rate 20, SpO2 97.00%. Physical Exam  Constitutional: He is oriented to person, place, and time. He appears well-developed and well-nourished.  HENT:  Head: Normocephalic and atraumatic.  Eyes: Conjunctivae and EOM are normal. Pupils are equal, round, and reactive to light.  Neck: Normal range of motion. Neck supple.  Cardiovascular:  Normal rate and regular rhythm.   Respiratory: Effort normal and breath sounds normal.  GI: Soft. Bowel sounds are normal.  Musculoskeletal:  Tender in scapula on right  Neurological: He is alert and oriented to person, place, and time.  Weak in tricep, w.e. And grip on right.  Skin: Skin is warm and dry.  Psychiatric: He has a normal mood and affect. His behavior is normal. Judgment and thought content normal.     Assessment/Plan HNP C6-7 on right for sitting cervical laminotomy and discectomy with met- rx.  Sonam Huelsmann J 01/21/2013, 7:41 AM

## 2013-01-23 ENCOUNTER — Encounter (HOSPITAL_COMMUNITY): Payer: Self-pay | Admitting: Neurological Surgery

## 2013-02-15 ENCOUNTER — Ambulatory Visit (INDEPENDENT_AMBULATORY_CARE_PROVIDER_SITE_OTHER): Payer: BC Managed Care – PPO | Admitting: Family Medicine

## 2013-02-15 VITALS — BP 126/80 | HR 70 | Temp 97.5°F | Resp 18 | Ht 68.0 in | Wt 384.0 lb

## 2013-02-15 DIAGNOSIS — M5010 Cervical disc disorder with radiculopathy, unspecified cervical region: Secondary | ICD-10-CM

## 2013-02-15 DIAGNOSIS — J209 Acute bronchitis, unspecified: Secondary | ICD-10-CM

## 2013-02-15 DIAGNOSIS — R490 Dysphonia: Secondary | ICD-10-CM

## 2013-02-15 DIAGNOSIS — M729 Fibroblastic disorder, unspecified: Secondary | ICD-10-CM

## 2013-02-15 MED ORDER — METHYLPREDNISOLONE (PAK) 4 MG PO TABS: ORAL_TABLET | ORAL | Status: DC

## 2013-02-15 MED ORDER — AZITHROMYCIN 250 MG PO TABS: ORAL_TABLET | ORAL | Status: DC

## 2013-02-15 NOTE — Progress Notes (Signed)
Subjective:  This chart was scribed for Elvina Sidle, MD by Arlan Organ, ED Scribe. This patient was seen in room Room 12 and the patient's care was started 1:04 PM.   Patient ID: Henry Frame., male    DOB: 03-Nov-1960, 52 y.o.   MRN: [id number]  HPI HPI Comments: Henry Anacker. is a 52 y.o. male who presents to [facility] complaining of chest congestrion that started about 3 weeks ago. Pt also reports associated wheezing and chest tightness. He states bending over worsens the chest tightness and pain. Pt denies sinus congestion or fever. He says he had surgery 4 weeks ago for a laminectomy on c6-c7. Pt plans to follow up with Dr. Meredith Staggers in 2 weeks. Pt is currently a Runner, broadcasting/film/video and an Event organiser. Pt is currently divorced, and now lives with his Mother.  Pt denies any discomfort from his varicose veins.   Review of Systems  Constitutional: Negative for fever.  HENT: Positive for congestion.   Respiratory: Positive for chest tightness. Negative for cough.     Past Medical History  Diagnosis Date   Family history of anesthesia complication     " father having delirium after anesthesia"   Arthritis    Numbness and tingling     Hx: of in right arm   Heart murmur     Hx; of as a child   Joint pain     Hx: of periodically   Sleep apnea    Pneumonia    Kidney stones     Hx; of as a child   Anemia     Past Surgical History  Procedure Laterality Date   Lumbar laminectomy      Hx: of 2005   Joint replacement     Total hip arthroplasty      Hx: of B/L hips   Tenosynovitis      Hx: of thumb   Tonsillectomy     Colonoscopy      Hx: of   Posterior cervical laminectomy with met- rx Right 01/21/2013    Procedure: Right Sitting C6-7 Microdiskectomy with Met-rex;  Surgeon: Barnett Abu, MD;  Location: [facility];  Service: Neurosurgery;  Laterality: Right;  Right Sitting C6-7 Microdiskectomy with Met-rex    History   Social History   Marital  Status: Divorced    Spouse Name: N/A    Number of Children: N/A   Years of Education: N/A   Occupational History   Not on file.   Social History Main Topics   Smoking status: Never Smoker    Smokeless tobacco: Former Neurosurgeon    Types: Chew    Quit date: 05/01/1996   Alcohol Use: Yes     Comment: [employer]QU[employer]   Drug Use: No   Sexual Activity: Not on file   Other Topics Concern   Not on file   Social History Narrative   No narrative on file       Objective:   Physical Exam  Nursing note and vitals reviewed. Constitutional: He is oriented to person, place, and time. He appears well-developed and well-nourished.  HENT:  Head: Normocephalic and atraumatic.  Eyes: EOM are normal.  Neck: Normal range of motion.  Cardiovascular: Normal rate.   Pulmonary/Chest: Effort normal. He has wheezes.  Musculoskeletal: Normal range of motion.  Neurological: He is alert and oriented to person, place, and time.  Skin: Skin is warm and dry.  Psychiatric: He has a normal mood and affect. His  behavior is normal.          Assessment & Plan:  Hoarseness - Plan: methylPREDNIsolone (MEDROL DOSPACK) 4 MG tablet, azithromycin (ZITHROMAX Z-PAK) 250 MG tablet  Acute bronchitis - Plan: methylPREDNIsolone (MEDROL DOSPACK) 4 MG tablet, azithromycin (ZITHROMAX Z-PAK) 250 MG tablet  Cervical disc disorder with radiculopathy of cervical region  Morbid obesity  Signed, Elvina Sidle, MD

## 2013-02-15 NOTE — Patient Instructions (Signed)
If not better in 48 hours, let me know

## 2013-02-18 ENCOUNTER — Ambulatory Visit (INDEPENDENT_AMBULATORY_CARE_PROVIDER_SITE_OTHER): Payer: BC Managed Care – PPO | Admitting: Family Medicine

## 2013-02-18 ENCOUNTER — Telehealth: Payer: Self-pay

## 2013-02-18 ENCOUNTER — Ambulatory Visit: Payer: BC Managed Care – PPO

## 2013-02-18 VITALS — BP 142/96 | HR 72 | Temp 98.5°F | Resp 18 | Ht 68.0 in | Wt 379.0 lb

## 2013-02-18 DIAGNOSIS — R079 Chest pain, unspecified: Secondary | ICD-10-CM

## 2013-02-18 DIAGNOSIS — R0602 Shortness of breath: Secondary | ICD-10-CM

## 2013-02-18 DIAGNOSIS — M791 Myalgia, unspecified site: Secondary | ICD-10-CM

## 2013-02-18 DIAGNOSIS — D649 Anemia, unspecified: Secondary | ICD-10-CM

## 2013-02-18 LAB — COMPREHENSIVE METABOLIC PANEL WITH GFR
ALT: 20 U/L (ref 0–53)
Albumin: 4.2 g/dL (ref 3.5–5.2)
CO2: 27 meq/L (ref 19–32)
Calcium: 9.1 mg/dL (ref 8.4–10.5)
Chloride: 102 meq/L (ref 96–112)
Potassium: 4.3 meq/L (ref 3.5–5.3)
Sodium: 138 meq/L (ref 135–145)
Total Protein: 7.1 g/dL (ref 6.0–8.3)

## 2013-02-18 LAB — POCT CBC
Granulocyte percent: 45.8 % (ref 37–80)
HCT, POC: 34.2 % — AB (ref 43.5–53.7)
Hemoglobin: 10.6 g/dL — AB (ref 14.1–18.1)
Lymph, poc: 7.1 — AB (ref 0.6–3.4)
MCH, POC: 28 pg (ref 27–31.2)
MCHC: 31 g/dL — AB (ref 31.8–35.4)
MCV: 90.3 fL (ref 80–97)
MID (cbc): 0.6 (ref 0–0.9)
MPV: 9.4 fL (ref 0–99.8)
POC Granulocyte: 6.5 (ref 2–6.9)
POC LYMPH PERCENT: 49.9 %L (ref 10–50)
POC MID %: 4.3 %M (ref 0–12)
Platelet Count, POC: 182 10*3/uL (ref 142–424)
RBC: 3.79 M/uL — AB (ref 4.69–6.13)
RDW, POC: 14.7 %
WBC: 14.2 10*3/uL — AB (ref 4.6–10.2)

## 2013-02-18 LAB — D-DIMER, QUANTITATIVE: D-Dimer, Quant: 0.42 ug{FEU}/mL (ref 0.00–0.48)

## 2013-02-18 LAB — COMPREHENSIVE METABOLIC PANEL
AST: 15 U/L (ref 0–37)
Alkaline Phosphatase: 84 U/L (ref 39–117)
BUN: 21 mg/dL (ref 6–23)
Creat: 0.73 mg/dL (ref 0.50–1.35)
Glucose, Bld: 90 mg/dL (ref 70–99)
Total Bilirubin: 0.4 mg/dL (ref 0.3–1.2)

## 2013-02-18 LAB — IFOBT (OCCULT BLOOD): IFOBT: NEGATIVE

## 2013-02-18 MED ORDER — HYDROCODONE-ACETAMINOPHEN 5-325 MG PO TABS: 1.0000 | ORAL_TABLET | Freq: Four times a day (QID) | ORAL | Status: DC | PRN

## 2013-02-18 MED ORDER — LEVOFLOXACIN 500 MG PO TABS: 500.0000 mg | ORAL_TABLET | Freq: Every day | ORAL | Status: DC

## 2013-02-18 NOTE — Telephone Encounter (Signed)
Called left message for him to call me back.  

## 2013-02-18 NOTE — Telephone Encounter (Signed)
PT STATES HE WAS SEEN AND TOLD TO CALL BACK ON AN UPDATE, IS STILL HAVING CHEST PAINS AND PT WAS STRONGLY ADVISED TO COME BACK IN, BUT STATED HE HAD TO WORK SINCE  BEING OUT A WHILE AGO FOR SURGERY. PLEASE CALL [phone]

## 2013-02-18 NOTE — Progress Notes (Signed)
[facility]:  Office Visit  Chief Complaint:  Chief Complaint  Patient presents with  . Chest Pain    HPI: Henry Hinton. is a 52 y.o. male who is here for  Recheck of sxs from Saturday OV.  Neck pain with bialteral arm paresthesia resolved with prednisone, still has some CP.  He continues to have CP with bending over in the AM. 4 weeks ago has neck surgery, c6-7 posterior laminectomy and decompression without fusion  by Dr Danielle Dess 2 weeks after that started coughing and wheezing, no prior h/o asthma or allergies. He still has Chest pain , not a sharp pain, just a spasm pain 7/10 pain when he is bending over and he has soreness when he is just sitting, reaching down and moving makes it worse.  He was coughing for a good week. 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  Denies DM, HTN, CAD, smoker. Last labs done in  [place], [place]  1 year ago were normal He denies any risk factors for DVT/PE except recent syrgery He deneis leg pain but does have  Large varicose venins and tortuous bilateral veins in legs.  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   MRI from 11/11/2012 prior to surgery: IMPRESSION:  1. 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  2. Mild cervical spine degenerative changes elsewhere.   Past Medical History  Diagnosis Date  . Family history of anesthesia complication     " father having delirium after anesthesia"  . Arthritis   . Numbness and tingling     Hx: of in right arm  . Heart murmur     Hx; of as a child  . Joint pain     Hx: of periodically  . Sleep apnea   . Pneumonia   . Kidney stones     Hx; of as a child  . Anemia    Past Surgical History  Procedure Laterality Date  . Lumbar laminectomy      Hx: of 2005  . Joint replacement    . Total hip arthroplasty      Hx: of B/L hips  . Tenosynovitis      Hx: of thumb  . Tonsillectomy    . Colonoscopy      Hx: of  . Posterior cervical laminectomy with met- rx Right 01/21/2013    Procedure: Right Sitting C6-7 Microdiskectomy with  Met-rex;  Surgeon: Barnett Abu, MD;  Location: [facility];  Service: Neurosurgery;  Laterality: Right;  Right Sitting C6-7 Microdiskectomy with Met-rex   History   Social History  . Marital Status: Divorced    Spouse Name: N/A    Number of Children: N/A  . Years of Education: N/A   Social History Main Topics  . Smoking status: Never Smoker   . Smokeless tobacco: Former Neurosurgeon    Types: Chew    Quit date: 05/01/1996  . 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 Drug Use: No  . Sexual Activity: None   Other Topics Concern  . None   Social History Narrative  . None   Family History  Problem Relation Age of Onset  . COPD Maternal Grandmother   . Heart disease Maternal Grandfather   . Cancer Paternal Grandfather   . Cancer Father    No Known Allergies Prior to Admission medications   Medication Sig Start Date End Date Taking? Authorizing Provider   aspirin 325 MG tablet Take 325 mg by mouth daily.    Historical Provider, MD  azithromycin (ZITHROMAX Z-PAK) 250 MG tablet Take as directed on pack 02/15/13   Elvina Sidle, MD  docusate sodium (COLACE) 100 MG capsule Take 100 mg by mouth daily.    Historical Provider, MD  ferrous sulfate (IRON SUPPLEMENT) 325 (65 FE) MG tablet Take 325 mg by mouth daily with breakfast.    Historical Provider, MD  furosemide (LASIX) 40 MG tablet Take 40 mg by mouth daily.    Historical Provider, MD  ibuprofen (ADVIL,MOTRIN) 200 MG tablet Take 400 mg by mouth every 6 (six) hours as needed for pain.    Historical Provider, MD  methylPREDNIsolone (MEDROL DOSPACK) 4 MG tablet follow package directions 02/15/13   Elvina Sidle, MD  Multiple Vitamins-Minerals (MENS 50+ MULTI VITAMIN/MIN) TABS Take 1 tablet by mouth daily.     Historical Provider, MD  potassium chloride (K-DUR) 10 MEQ tablet Take 10 mEq by mouth daily.    Historical Provider, MD     ROS: The patient denies fevers, chills, night sweats, unintentional weight loss, chest pain, palpitations, wheezing, dyspnea on exertion, nausea, vomiting, abdominal pain, dysuria, hematuria, melena, numbness, weakness, or tingling.   All other systems have been reviewed and were otherwise negative with the exception of those mentioned in the HPI and as above.    PHYSICAL EXAM: Filed Vitals:   02/18/13 1531  BP: 142/96  Pulse: 72  Temp: 98.5 F (36.9 C)  Resp: 18  spo2  96% Filed Vitals:   02/18/13 1531  Height: 5\' 8"  (1.727 m)  Weight: 379 lb (171.913 kg)   Body mass index is 57.64 kg/(m^2).  General: Alert, no acute distress, morbidly obese  HEENT:  Normocephalic, atraumatic, oropharynx patent. EOMI, PERRLA Cardiovascular:  Regular rate and rhythm, no rubs murmurs or gallops.  No Carotid bruits, radial pulse intact. No pedal edema. + tender on deep palpation Respiratory: Clear to auscultation bilaterally.  No wheezes, rales, or rhonchi.  No cyanosis,  no use of accessory musculature GI: No organomegaly, abdomen is soft and non-tender, positive bowel sounds.  No masses. Skin: No rashes. Neurologic: Facial musculature symmetric. Psychiatric: Patient is appropriate throughout our interaction. Lymphatic: No cervical lymphadenopathy Musculoskeletal: Gait intact. + engorged veins and varicosities bilaterally, no assymetry or warmth Neck exam-there is no e/o infection at site of c spine surgery, nontender, wound is clean, nondraining  LABS: Results for orders placed  in visit on 02/18/13  POCT CBC      Result Value Range   WBC 14.2 (*) 4.6 - 10.2 K/uL   Lymph, poc 7.1 (*) 0.6 - 3.4   POC LYMPH PERCENT 49.9  10 - 50 %L   MID (cbc) 0.6  0 - 0.9   POC MID % 4.3  0 - 12 %M   POC Granulocyte 6.5  2 - 6.9   Granulocyte percent 45.8  37 - 80 %G   RBC 3.79 (*) 4.69 - 6.13 M/uL   Hemoglobin 10.6 (*) 14.1 - 18.1 g/dL   HCT, POC 41.3 (*) 24.4 - 53.7 %   MCV 90.3  80 - 97 fL   MCH, POC 28.0  27 - 31.2 pg   MCHC 31.0 (*) 31.8 - 35.4 g/dL   RDW, POC 01.0     Platelet Count, POC 182  142 - 424 K/uL   MPV 9.4  0 - 99.8 fL  IFOBT (OCCULT BLOOD)      Result Value Range   IFOBT Negative       EKG/XRAY:   Primary read interpreted by Dr. Conley Rolls at [facility]. ? Left upper bronchial thickening/markings vs infiltrate   ASSESSMENT/PLAN: Encounter Diagnoses  Name Primary?  . Chest pain Yes  . SOB (shortness of breath)   . Anemia    Pleasant morbidly obese white 52 year male with a 4 week history of intermittent CP with movement in Am , especially bednign over. He os 4 weeks post op c6-7 laminectomy with decomrpesson without fusion by Dr Danielle Dess. He is on ASA 325 mg daily.   Leukocytosis from prednisone vs infection, he does have slight lymphocytosis on diff EKG was NSR with ? mutliform P waves related to pulm issues ie OSA CXR mostly likely bronchitic changes, He will be changed from Azithromycin to Levaquin 500 mg daily x 7 days Hemosure negative,  monitor, decrease NSAID use to just ASA , take  NORCO prn for pain. Continue with iron supplement Go to ER prn worsening sxs Labs pending: D dimer and also CMP If sxs are not relieved then will refer to cardiology, Dr Jacinto Halim F/u prn Gross sideeffects, risk and benefits, and alternatives of medications d/w patient. Patient is aware that all medications have potential sideeffects and we are unable to predict every sideeffect or drug-drug interaction that may occur.  Naveena Eyman PHUONG, DO 02/18/2013 5:58 PM   Called patient with negative D Dimer last night 02/18/2013 around 9:15 pm.

## 2013-02-18 NOTE — Telephone Encounter (Signed)
Pt was seen in office.

## 2013-02-19 ENCOUNTER — Telehealth: Payer: Self-pay | Admitting: Family Medicine

## 2013-02-19 NOTE — Telephone Encounter (Signed)
LM about labs and xray

## 2013-03-03 ENCOUNTER — Other Ambulatory Visit: Payer: Self-pay | Admitting: Physician Assistant

## 2013-03-04 ENCOUNTER — Other Ambulatory Visit: Payer: Self-pay | Admitting: Physician Assistant

## 2013-03-07 ENCOUNTER — Telehealth: Payer: Self-pay

## 2013-03-07 MED ORDER — FUROSEMIDE 40 MG PO TABS: 40.0000 mg | ORAL_TABLET | Freq: Every day | ORAL | Status: DC

## 2013-03-07 NOTE — Telephone Encounter (Signed)
Pended please advise.  

## 2013-03-07 NOTE — Telephone Encounter (Signed)
[place] - Pt. Picked up his last refill of lasix, which is a 30-day supply.  He says it usually is a 90-day supply.  He knows he is due for a physical and scheduled this for Jan. 5 with you.  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  He said the Jan. 5 visit would be the best because its a teacher workday.  Can we please refill the lasix until then?  [phone]

## 2013-03-07 NOTE — Telephone Encounter (Signed)
Yes.  Meds ordered this encounter  Medications  . furosemide (LASIX) 40 MG tablet    Sig: Take 1 tablet (40 mg total) by mouth daily. PATIENT NEEDS OFFICE VISIT FOR ADDITIONAL REFILLS    Dispense:  90 tablet    Refill:  0

## 2013-04-08 ENCOUNTER — Telehealth: Payer: Self-pay

## 2013-04-08 NOTE — Telephone Encounter (Signed)
Dr. Conley Rolls,  Patient wants to let you know that left upper chest pain has came back, when breathing deeply also his sternum is very pronounced . Blood pressure normal not ill or no fever,  Returning her phone call if symptoms come back with no cause, its been a month and half ago. Just came on yesterday. Patient advised he may need another office visit and that Dr. Conley Rolls or her assistant will call him back to instruct him on what to do.   [phone]

## 2013-04-09 ENCOUNTER — Other Ambulatory Visit: Payer: Self-pay | Admitting: Family Medicine

## 2013-04-09 DIAGNOSIS — R079 Chest pain, unspecified: Secondary | ICD-10-CM

## 2013-04-09 NOTE — Progress Notes (Signed)
LM for him to return to get evaluated, go to ER for worsening sxs. If it is the same muscle pain  with just bending over like he did last time then can monitor but if anything different then need to go to ER. I have referred him to Dr Verl Dicker office for evaluation ASAP.

## 2013-05-05 ENCOUNTER — Ambulatory Visit (INDEPENDENT_AMBULATORY_CARE_PROVIDER_SITE_OTHER): Payer: BC Managed Care – PPO | Admitting: Family Medicine

## 2013-05-05 ENCOUNTER — Encounter: Payer: Self-pay | Admitting: Family Medicine

## 2013-05-05 VITALS — BP 129/78 | HR 70 | Temp 98.1°F | Resp 18 | Ht 67.75 in | Wt 387.4 lb

## 2013-05-05 DIAGNOSIS — R609 Edema, unspecified: Secondary | ICD-10-CM

## 2013-05-05 DIAGNOSIS — D649 Anemia, unspecified: Secondary | ICD-10-CM

## 2013-05-05 DIAGNOSIS — Z125 Encounter for screening for malignant neoplasm of prostate: Secondary | ICD-10-CM

## 2013-05-05 DIAGNOSIS — Z8042 Family history of malignant neoplasm of prostate: Secondary | ICD-10-CM

## 2013-05-05 DIAGNOSIS — Z0000 Encounter for general adult medical examination without abnormal findings: Secondary | ICD-10-CM

## 2013-05-05 DIAGNOSIS — R490 Dysphonia: Secondary | ICD-10-CM

## 2013-05-05 DIAGNOSIS — Z1211 Encounter for screening for malignant neoplasm of colon: Secondary | ICD-10-CM

## 2013-05-05 DIAGNOSIS — R600 Localized edema: Secondary | ICD-10-CM

## 2013-05-05 DIAGNOSIS — R079 Chest pain, unspecified: Secondary | ICD-10-CM

## 2013-05-05 MED ORDER — FUROSEMIDE 40 MG PO TABS: 40.0000 mg | ORAL_TABLET | Freq: Every day | ORAL | Status: DC

## 2013-05-05 MED ORDER — POTASSIUM CHLORIDE CRYS ER 10 MEQ PO TBCR: EXTENDED_RELEASE_TABLET | ORAL | Status: DC

## 2013-05-05 NOTE — Progress Notes (Signed)
   Subjective:    Patient ID: Henry Hinton., male    DOB: 03-04-1961, 53 y.o.   MRN: [id number]  HPI    Review of Systems  Constitutional: Negative.   HENT: Positive for voice change.   Eyes: Negative.   Respiratory: Positive for chest tightness and shortness of breath.   Cardiovascular: Positive for chest pain.  Endocrine: Negative.   Genitourinary: Negative.   Musculoskeletal: Positive for arthralgias.  Skin: Negative.   Allergic/Immunologic: Negative.   Neurological: Negative.   Hematological: Negative.   Psychiatric/Behavioral: Negative.        Objective:   Physical Exam        Assessment & Plan:

## 2013-05-05 NOTE — Progress Notes (Signed)
Subjective:    Patient ID: Henry Hinton., male    DOB: Jul 11, 1960, 53 y.o.   MRN: [id number]  HPI Henry Hinton. is a 53 y.o. male Here for CPE. Last seen by me in October 2013. Prior pt of Dr. Estell Harpin in [place]. Colonoscopy: 2004.  Prostate cancer screening/last PSA: dec 2013 - normal.  FH of prostate ca - dad diagnosed at 72yo.  Tetanus/Tdap: last 6 years.  Flu vaccine : refused, has not had.  Dentist: last appt in August.  Optho/eye care eval: appt annually.   Hx LE edema - on lasix 34m qd for some time (5-6 years), and kdurr 157m qd. No recent changes in leg swelling.    Chemistry      Component Value Date/Time   NA 138 02/18/2013 1709   K 4.3 02/18/2013 1709   CL 102 02/18/2013 1709   CO2 27 02/18/2013 1709   BUN 21 02/18/2013 1709   CREATININE 0.73 02/18/2013 1709   CREATININE 0.75 01/15/2013 1623      Component Value Date/Time   CALCIUM 9.1 02/18/2013 1709   ALKPHOS 84 02/18/2013 1709   AST 15 02/18/2013 1709   ALT 20 02/18/2013 1709   BILITOT 0.4 02/18/2013 1709      Chest pain - seen by Dr. LeMarin Comment0/21/14 -  Per her note - 4 weeks post op c6-7 laminectomy with decomrpesson without fusion by Dr ElEllene Routet that time. Noted to have leukocytosis from prednisone vs infection, with slight lymphocytosis on diff. Also noted anemia. EKG was NSR with ? mutliform P waves related to pulm issues ie OSA, CXR mostly likely bronchitic changes, changed from Azithromycin to Levaquin 500 mg daily x 7 days, chest pain resolved. D dimer was negative. See 12/10 phone note - had been moving furniture to storage area. Notice pain in chest few days later into next week, then resolved.  Sore with movement. No associated dyspena, n/v/diaphoresis.  referred to cardiologist for eval. Has appt scheduled with Dr. GaEinar Gipn next few weeks.  No recent chest pain. No new calf pain or swelling.  Hx HNP - cervical s/p surgery 12/2012 - Dr. ElEllene RouteHoarseness since surgery - plan on watching this 6  months, then if not improved to see ENT. feels like wheezing in morning "throaty" breathing. Shortness of breath with chest pains in October only.  Last OV with Dr. ElEllene Routen November. Has had hoarseness after intubation for prior knee surgery.no known trouble breathing. Swimming every day - not short of breath with this.   Anemia - Hemosure negative at 10/14 ov.,advised to decrease NSAID use to just ASA , take NORCO prn for pain, and continue with iron supplement. No dark/tarry stools. No hematuria or other known bleeding areas. No heartburn, no abd pain. Anemia in 2004. Thinks has had normal CBC unless infection.  Lab Results  Component Value Date   WBC 14.2* 02/18/2013   HGB 10.6* 02/18/2013   HCT 34.2* 02/18/2013   MCV 90.3 02/18/2013   PLT 245 01/15/2013    Obesity - swimming for exercise each day - 50 minutes. No recent diet changes - ate more at Christmas.    Patient Active Problem List   Diagnosis Date Noted  . HNP (herniated nucleus pulposus), cervical 01/21/2013  . bilat hip replacement 04/08/2012  . Morbid obesity 04/08/2012  . Edema 04/08/2012   Past Medical History  Diagnosis Date  . Family history of anesthesia complication     " father having delirium  after anesthesia"  . Arthritis   . Numbness and tingling     Hx: of in right arm  . Heart murmur     Hx; of as a child  . Joint pain     Hx: of periodically  . Sleep apnea   . Pneumonia   . Kidney stones     Hx; of as a child  . Anemia    Past Surgical History  Procedure Laterality Date  . Lumbar laminectomy      Hx: of 2005  . Joint replacement    . Total hip arthroplasty      Hx: of B/L hips  . Tenosynovitis      Hx: of thumb  . Tonsillectomy    . Colonoscopy      Hx: of  . Posterior cervical laminectomy with met- rx Right 01/21/2013    Procedure: Right Sitting C6-7 Microdiskectomy with Met-rex;  Surgeon: Kristeen Miss, MD;  Location: [facility];  Service: Neurosurgery;  Laterality: Right;  Right  Sitting C6-7 Microdiskectomy with Met-rex  . Spine surgery     No Known Allergies Prior to Admission medications   Medication Sig Start Date End Date Taking? Authorizing Provider  aspirin 325 MG tablet Take 325 mg by mouth daily.   Yes Historical Provider, MD  docusate sodium (COLACE) 100 MG capsule Take 100 mg by mouth daily.   Yes Historical Provider, MD  ferrous sulfate (IRON SUPPLEMENT) 325 (65 FE) MG tablet Take 325 mg by mouth daily with breakfast.   Yes Historical Provider, MD  furosemide (LASIX) 40 MG tablet Take 1 tablet (40 mg total) by mouth daily. PATIENT NEEDS OFFICE VISIT FOR ADDITIONAL REFILLS 03/07/13  Yes Chelle S Jeffery, PA-C  HYDROcodone-acetaminophen (NORCO) 5-325 MG per tablet Take 1 tablet by mouth every 6 (six) hours as needed for pain. 02/18/13  Yes Thao P Le, DO  ibuprofen (ADVIL,MOTRIN) 200 MG tablet Take 400 mg by mouth every 6 (six) hours as needed for pain.   Yes Historical Provider, MD  Multiple Vitamins-Minerals (MENS 50+ MULTI VITAMIN/MIN) TABS Take 1 tablet by mouth daily.    Yes Historical Provider, MD  potassium chloride (K-DUR) 10 MEQ tablet Take 10 mEq by mouth daily.   Yes Historical Provider, MD  levofloxacin (LEVAQUIN) 500 MG tablet Take 1 tablet (500 mg total) by mouth daily. 02/18/13   Thao P Le, DO  methylPREDNIsolone (MEDROL DOSPACK) 4 MG tablet follow package directions 02/15/13   Robyn Haber, MD  potassium chloride (K-DUR,KLOR-CON) 10 MEQ tablet TAKE 1 TABLET BY MOUTH DAILY 03/04/13   Thao P Le, DO   History   Social History  . Marital Status: Divorced    Spouse Name: N/A    Number of Children: N/A  . Years of Education: N/A   Occupational History  . 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 Smoking status: Never Smoker   . Smokeless tobacco: Former Systems developer    Types: [place] date: 05/01/1993  . Alcohol Use: 0.5 oz/week    1 drink(s) per week     Comment: [employer]  . Drug Use: No  .  Sexual Activity: No   Other Topics Concern  . Not on file   Social History Narrative  . No narrative on file      Review of Systems 13 point review of systems per patient health survey noted.  Negative other than as indicated on reviewed nursing note or  above.      Objective:   Physical Exam  Vitals reviewed. Constitutional: He is oriented to person, place, and time. He appears well-developed and well-nourished.  Overweight/obese  HENT:  Head: Normocephalic and atraumatic.  Right Ear: External ear normal.  Left Ear: External ear normal.  Mouth/Throat: Oropharynx is clear and moist.  Eyes: Conjunctivae and EOM are normal. Pupils are equal, round, and reactive to light.  Neck: Normal range of motion. Neck supple. No thyromegaly present.  Cardiovascular: Normal rate, regular rhythm, normal heart sounds and intact distal pulses.  Exam reveals no friction rub.   No murmur heard. Pulmonary/Chest: Effort normal and breath sounds normal. No respiratory distress. He has no wheezes.  Abdominal: Soft. He exhibits no distension. There is no tenderness. Hernia confirmed negative in the right inguinal area and confirmed negative in the left inguinal area.  Genitourinary: Prostate normal.  Musculoskeletal: Normal range of motion. He exhibits edema (le bilaterally, without pitting. ). He exhibits no tenderness.  Lymphadenopathy:    He has no cervical adenopathy.  Neurological: He is alert and oriented to person, place, and time.  Skin: Skin is warm and dry.  Psychiatric: He has a normal mood and affect. His behavior is normal.      Filed Vitals:   05/05/13 1601  BP: 129/78  Pulse: 70  Temp: 98.1 F (36.7 C)  TempSrc: Oral  Resp: 18  Height: 5' 7.75" (1.721 m)  Weight: 387 lb 6.4 oz (175.723 kg)  SpO2: 96%   EKG: sr, nonspecific t wave in V2-3 but no acute findings otherwise.      Assessment & Plan:   Shlok Raz. is a 53 y.o. male Annual physical exam - Plan: EKG  12-Lead, CBC with Differential, PSA, TSH, COMPLETE METABOLIC PANEL WITH GFR, Lipid panel - anticipatory guidance below and labs obtained.   Screening for colon cancer - Plan: Ambulatory referral to Gastroenterology. Prior hemosure negative few months ago, so not repeated today.   Screening for prostate cancer, Family history of prostate cancer - Plan: PSA obtained.   Leg edema - Plan: potassium chloride (K-DUR,KLOR-CON) 10 MEQ tablet, furosemide (LASIX) 40 MG tablet refilled. Stable.   Chest pain - Plan: EKG 12-Lead, COMPLETE METABOLIC PANEL WITH GFR - now resolved. Has appt pending with cardiology, er/911 cp precautions.   Hoarseness of voice - postoperatively.  Hx of same in past postoperatively, but offered ENT eval. He would like to wait for another 2 months for improvement, then see ent if not resolved. rtc precautions discussed.   Anemia - recheck CBC.   Meds ordered this encounter  Medications  . potassium chloride (K-DUR,KLOR-CON) 10 MEQ tablet    Sig: TAKE 1 TABLET BY MOUTH DAILY    Dispense:  90 tablet    Refill:  1  . furosemide (LASIX) 40 MG tablet    Sig: Take 1 tablet (40 mg total) by mouth daily.    Dispense:  90 tablet    Refill:  1   Patient Instructions  You should receive a call or letter about your lab results within the next week to 10 days.  Keep follow up with cardiologist. Return to the clinic or go to the nearest emergency room if any of your symptoms worsen or new symptoms occur. If hoarseness not improving - I can refer you to ENT. We will refer you for colonoscopy.   Keeping you healthy  Get these tests  Blood pressure- Have your blood pressure checked once a year  by your healthcare provider.  Normal blood pressure is 120/80  Weight- Have your body mass index (BMI) calculated to screen for obesity.  BMI is a measure of body fat based on height and weight. You can also calculate your own BMI at [email or web address].  Cholesterol- Have your  cholesterol checked every year.  Diabetes- Have your blood sugar checked regularly if you have high blood pressure, high cholesterol, have a family history of diabetes or if you are overweight.  Screening for Colon Cancer- Colonoscopy starting at age 57.  Screening may begin sooner depending on your family history and other health conditions. Follow up colonoscopy as directed by your Gastroenterologist.  Screening for Prostate Cancer- Both blood work (PSA) and a rectal exam help screen for Prostate Cancer.  Screening begins at age 25 with African-American men and at age 71 with Caucasian men.  Screening may begin sooner depending on your family history.  Take these medicines  Aspirin- One aspirin daily can help prevent Heart disease and Stroke.  Flu shot- Every fall.  Tetanus- Every 10 years.  Zostavax- Once after the age of 7 to prevent Shingles.  Pneumonia shot- Once after the age of 15; if you are younger than 47, ask your healthcare provider if you need a Pneumonia shot.  Take these steps  Don't smoke- If you do smoke, talk to your doctor about quitting.  For tips on how to quit, go to www.smokefree.gov or call 1-800-QUIT-NOW.  Be physically active- Exercise 5 days a week for at least 30 minutes.  If you are not already physically active start slow and gradually work up to 30 minutes of moderate physical activity.  Examples of moderate activity include walking briskly, mowing the yard, dancing, swimming, bicycling, etc.  Eat a healthy diet- Eat a variety of healthy food such as fruits, vegetables, low fat milk, low fat cheese, yogurt, lean meant, poultry, fish, beans, tofu, etc. For more information go to www.thenutritionsource.org  Drink alcohol in moderation- Limit alcohol intake to less than two drinks a day. Never drink and drive.  Dentist- Brush and floss twice daily; visit your dentist twice a year.  Depression- Your emotional health is as important as your physical health.  If you're feeling down, or losing interest in things you would normally enjoy please talk to your healthcare provider.  Eye exam- Visit your eye doctor every year.  Safe sex- If you may be exposed to a sexually transmitted infection, use a condom.  Seat belts- Seat belts can save your life; always wear one.  Smoke/Carbon Monoxide detectors- These detectors need to be installed on the appropriate level of your home.  Replace batteries at least once a year.  Skin cancer- When out in the sun, cover up and use sunscreen 15 SPF or higher.  Violence- If anyone is threatening you, please tell your healthcare provider.  Living Will/ Health care power of attorney- Speak with your healthcare provider and family.

## 2013-05-05 NOTE — Patient Instructions (Signed)
You should receive a call or letter about your lab results within the next week to 10 days.  Keep follow up with cardiologist. Return to the clinic or go to the nearest emergency room if any of your symptoms worsen or new symptoms occur. If hoarseness not improving - I can refer you to ENT. We will refer you for colonoscopy.   Keeping you healthy  Get these tests  Blood pressure- Have your blood pressure checked once a year by your healthcare provider.  Normal blood pressure is 120/80  Weight- Have your body mass index (BMI) calculated to screen for obesity.  BMI is a measure of body fat based on height and weight. You can also calculate your own BMI at [email or web address].  Cholesterol- Have your cholesterol checked every year.  Diabetes- Have your blood sugar checked regularly if you have high blood pressure, high cholesterol, have a family history of diabetes or if you are overweight.  Screening for Colon Cancer- Colonoscopy starting at age 32.  Screening may begin sooner depending on your family history and other health conditions. Follow up colonoscopy as directed by your Gastroenterologist.  Screening for Prostate Cancer- Both blood work (PSA) and a rectal exam help screen for Prostate Cancer.  Screening begins at age 14 with African-American men and at age 52 with Caucasian men.  Screening may begin sooner depending on your family history.  Take these medicines  Aspirin- One aspirin daily can help prevent Heart disease and Stroke.  Flu shot- Every fall.  Tetanus- Every 10 years.  Zostavax- Once after the age of 55 to prevent Shingles.  Pneumonia shot- Once after the age of 65; if you are younger than 64, ask your healthcare provider if you need a Pneumonia shot.  Take these steps  Don't smoke- If you do smoke, talk to your doctor about quitting.  For tips on how to quit, go to www.smokefree.gov or call 1-800-QUIT-NOW.  Be physically active- Exercise 5 days a week for  at least 30 minutes.  If you are not already physically active start slow and gradually work up to 30 minutes of moderate physical activity.  Examples of moderate activity include walking briskly, mowing the yard, dancing, swimming, bicycling, etc.  Eat a healthy diet- Eat a variety of healthy food such as fruits, vegetables, low fat milk, low fat cheese, yogurt, lean meant, poultry, fish, beans, tofu, etc. For more information go to www.thenutritionsource.org  Drink alcohol in moderation- Limit alcohol intake to less than two drinks a day. Never drink and drive.  Dentist- Brush and floss twice daily; visit your dentist twice a year.  Depression- Your emotional health is as important as your physical health. If you're feeling down, or losing interest in things you would normally enjoy please talk to your healthcare provider.  Eye exam- Visit your eye doctor every year.  Safe sex- If you may be exposed to a sexually transmitted infection, use a condom.  Seat belts- Seat belts can save your life; always wear one.  Smoke/Carbon Monoxide detectors- These detectors need to be installed on the appropriate level of your home.  Replace batteries at least once a year.  Skin cancer- When out in the sun, cover up and use sunscreen 15 SPF or higher.  Violence- If anyone is threatening you, please tell your healthcare provider.  Living Will/ Health care power of attorney- Speak with your healthcare provider and family.

## 2013-05-06 LAB — COMPLETE METABOLIC PANEL WITH GFR
ALT: 27 U/L (ref 0–53)
AST: 24 U/L (ref 0–37)
Albumin: 4.1 g/dL (ref 3.5–5.2)
Alkaline Phosphatase: 100 U/L (ref 39–117)
BUN: 17 mg/dL (ref 6–23)
CALCIUM: 8.7 mg/dL (ref 8.4–10.5)
CHLORIDE: 103 meq/L (ref 96–112)
CO2: 22 meq/L (ref 19–32)
CREATININE: 0.63 mg/dL (ref 0.50–1.35)
GFR, Est Non African American: >89 mL/min
Glucose, Bld: 87 mg/dL (ref 70–99)
Potassium: 4 mEq/L (ref 3.5–5.3)
Sodium: 139 mEq/L (ref 135–145)
Total Bilirubin: 0.5 mg/dL (ref 0.3–1.2)
Total Protein: 6.9 g/dL (ref 6.0–8.3)

## 2013-05-06 LAB — LIPID PANEL
CHOLESTEROL: 218 mg/dL — AB (ref 0–200)
HDL: 58 mg/dL (ref >39)
LDL CALC: 138 mg/dL — AB (ref 0–99)
Total CHOL/HDL Ratio: 3.8 Ratio
Triglycerides: 111 mg/dL (ref <150)
VLDL: 22 mg/dL (ref 0–40)

## 2013-05-06 LAB — PSA: PSA: 0.39 ng/mL (ref <=4.00)

## 2013-05-06 LAB — TSH: TSH: 2.414 u[IU]/mL (ref 0.350–4.500)

## 2013-05-07 ENCOUNTER — Telehealth: Payer: Self-pay | Admitting: *Deleted

## 2013-05-07 DIAGNOSIS — Z0000 Encounter for general adult medical examination without abnormal findings: Secondary | ICD-10-CM

## 2013-05-07 LAB — CBC WITH DIFFERENTIAL/PLATELET

## 2013-05-07 NOTE — Telephone Encounter (Signed)
Yes  - if possible I would like CBC redrawn. Thanks.

## 2013-05-07 NOTE — Telephone Encounter (Signed)
Spoke with patient he will return to clinic for redraw.

## 2013-05-07 NOTE — Telephone Encounter (Signed)
Received a phone call from [place]. Order sent for CBC to [facility] but the correct tube was not sent for testing.   Patient was here for a physical 05/05/13. Do you want [place] to have pt rtc to have redrawn?

## 2013-05-07 NOTE — Telephone Encounter (Signed)
Left message on machine for patient to call back.  Explained that there was a lab issue and we need to redraw CBC.  There is no charge for this, it was our error.

## 2013-05-08 ENCOUNTER — Other Ambulatory Visit (INDEPENDENT_AMBULATORY_CARE_PROVIDER_SITE_OTHER): Payer: BC Managed Care – PPO

## 2013-05-08 DIAGNOSIS — Z0000 Encounter for general adult medical examination without abnormal findings: Secondary | ICD-10-CM

## 2013-05-08 LAB — POCT CBC
Granulocyte percent: 34 %G — AB (ref 37–80)
HEMATOCRIT: 43.3 % — AB (ref 43.5–53.7)
HEMOGLOBIN: 13.5 g/dL — AB (ref 14.1–18.1)
LYMPH, POC: 7.8 — AB (ref 0.6–3.4)
MCH: 28.1 pg (ref 27–31.2)
MCHC: 31.2 g/dL — AB (ref 31.8–35.4)
MCV: 90.2 fL (ref 80–97)
MID (cbc): 0.6 (ref 0–0.9)
MPV: 10.5 fL (ref 0–99.8)
POC GRANULOCYTE: 4.4 (ref 2–6.9)
POC LYMPH %: 61.2 % — AB (ref 10–50)
POC MID %: 4.8 %M (ref 0–12)
Platelet Count, POC: 185 10*3/uL (ref 142–424)
RBC: 4.8 M/uL (ref 4.69–6.13)
RDW, POC: 14.7 %
WBC: 12.8 10*3/uL — AB (ref 4.6–10.2)

## 2013-05-08 NOTE — Progress Notes (Signed)
Patient here for labs only.  No charge, patients CBC not sent

## 2013-05-14 ENCOUNTER — Other Ambulatory Visit: Payer: Self-pay | Admitting: Family Medicine

## 2013-05-14 DIAGNOSIS — D72829 Elevated white blood cell count, unspecified: Secondary | ICD-10-CM

## 2013-07-16 ENCOUNTER — Encounter: Payer: Self-pay | Admitting: Family Medicine

## 2013-07-21 ENCOUNTER — Encounter: Payer: Self-pay | Admitting: Family Medicine

## 2013-11-25 ENCOUNTER — Ambulatory Visit (INDEPENDENT_AMBULATORY_CARE_PROVIDER_SITE_OTHER): Payer: BC Managed Care – PPO | Admitting: Family Medicine

## 2013-11-25 VITALS — BP 140/80 | HR 74 | Temp 98.2°F | Resp 20 | Ht 68.0 in | Wt 389.8 lb

## 2013-11-25 DIAGNOSIS — N390 Urinary tract infection, site not specified: Secondary | ICD-10-CM

## 2013-11-25 DIAGNOSIS — R350 Frequency of micturition: Secondary | ICD-10-CM

## 2013-11-25 DIAGNOSIS — R300 Dysuria: Secondary | ICD-10-CM

## 2013-11-25 DIAGNOSIS — G5711 Meralgia paresthetica, right lower limb: Secondary | ICD-10-CM

## 2013-11-25 DIAGNOSIS — R1031 Right lower quadrant pain: Secondary | ICD-10-CM

## 2013-11-25 DIAGNOSIS — R109 Unspecified abdominal pain: Secondary | ICD-10-CM

## 2013-11-25 DIAGNOSIS — G5710 Meralgia paresthetica, unspecified lower limb: Secondary | ICD-10-CM

## 2013-11-25 LAB — POCT URINALYSIS DIPSTICK
Bilirubin, UA: NEGATIVE
GLUCOSE UA: NEGATIVE
KETONES UA: NEGATIVE
Nitrite, UA: NEGATIVE
Protein, UA: NEGATIVE
SPEC GRAV UA: 1.015
Urobilinogen, UA: 0.2
pH, UA: 5.5

## 2013-11-25 LAB — POCT UA - MICROSCOPIC ONLY
Casts, Ur, LPF, POC: NEGATIVE
Crystals, Ur, HPF, POC: NEGATIVE
Mucus, UA: NEGATIVE
Yeast, UA: NEGATIVE

## 2013-11-25 MED ORDER — CIPROFLOXACIN HCL 500 MG PO TABS: 500.0000 mg | ORAL_TABLET | Freq: Two times a day (BID) | ORAL | Status: DC

## 2013-11-25 NOTE — Progress Notes (Addendum)
Subjective:    Patient ID: Henry Miles., male    DOB: 1960-10-25, 53 y.o.   MRN: [id number]  This chart was scribed for [name], MD by Erling Conte, Medical Scribe. This patient was seen in Room 13 and the patient's care was started at 5:42 PM.  Chief Complaint  Patient presents with  . Burning with urination    X 4 days     HPI HPI Comments: Henry Naramore. is a 53 y.o. male with a h/o cervical HNP, morbid obesity and edema who presents to the [facility] complaining of "burning" dysuria for the past 4 days. Patient believes he may have a possible UTI. He states he has been having associated urgency and frequency. States his last UTI was around 30 years ago. Denies any new sexual contacts. Patient denies any h/o of STI's. He denies any penile discharge, penile rash, hematuria, flank pain, fever, chills, or difficulty voiding urine  He also mentions that he has been having a burning pain in groin area. He denies any h/o of inguinal hernias.   Primary patient of Dr. Carlota Raspberry. Last seen for physical in January 2015, per note his PSA was normal at 0.39  Patient Active Problem List   Diagnosis Date Noted  . HNP (herniated nucleus pulposus), cervical 01/21/2013  . bilat hip replacement 04/08/2012  . Morbid obesity 04/08/2012  . Edema 04/08/2012   Past Medical History  Diagnosis Date  . Family history of anesthesia complication     " father having delirium after anesthesia"  . Arthritis   . Numbness and tingling     Hx: of in right arm  . Heart murmur     Hx; of as a child  . Joint pain     Hx: of periodically  . Sleep apnea   . Pneumonia   . Kidney stones     Hx; of as a child  . Anemia    Past Surgical History  Procedure Laterality Date  . Lumbar laminectomy      Hx: of 2005  . Joint replacement    . Total hip arthroplasty      Hx: of B/L hips  . Tenosynovitis      Hx: of thumb  . Tonsillectomy    . Colonoscopy      Hx: of  .  Posterior cervical laminectomy with met- rx Right 01/21/2013    Procedure: Right Sitting C6-7 Microdiskectomy with Met-rex;  Surgeon: Kristeen Miss, MD;  Location: [facility];  Service: Neurosurgery;  Laterality: Right;  Right Sitting C6-7 Microdiskectomy with Met-rex  . Spine surgery     No Known Allergies Prior to Admission medications   Medication Sig Start Date End Date Taking? Authorizing Provider  aspirin 325 MG tablet Take 325 mg by mouth daily.   Yes Historical Provider, MD  docusate sodium (COLACE) 100 MG capsule Take 100 mg by mouth daily.   Yes Historical Provider, MD  ferrous sulfate (IRON SUPPLEMENT) 325 (65 FE) MG tablet Take 325 mg by mouth daily with breakfast.   Yes Historical Provider, MD  furosemide (LASIX) 40 MG tablet Take 1 tablet (40 mg total) by mouth daily. 05/05/13  Yes [name], MD  HYDROcodone-acetaminophen (NORCO) 5-325 MG per tablet Take 1 tablet by mouth every 6 (six) hours as needed for pain. 02/18/13  Yes Thao P Le, DO  ibuprofen (ADVIL,MOTRIN) 200 MG tablet Take 400 mg by mouth every 6 (six) hours as  needed for pain.   Yes Historical Provider, MD  Multiple Vitamins-Minerals (MENS 50+ MULTI VITAMIN/MIN) TABS Take 1 tablet by mouth daily.    Yes Historical Provider, MD  potassium chloride (K-DUR) 10 MEQ tablet Take 10 mEq by mouth daily.   Yes Historical Provider, MD  potassium chloride (K-DUR,KLOR-CON) 10 MEQ tablet TAKE 1 TABLET BY MOUTH DAILY 05/05/13   [name], MD   History   Social History  . Marital Status: Divorced    Spouse Name: N/A    Number of Children: N/A  . Years of Education: N/A   Occupational History  . 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 Smoking status: Never Smoker   . Smokeless tobacco: Former Systems developer    Types: [place] date: 05/01/1993  . Alcohol Use: 0.5 oz/week    1 drink(s) per week     Comment: BEER AND LIQUOR  . Drug Use: No  . Sexual Activity: No   Other Topics  Concern  . Not on file   Social History Narrative  . No narrative on file     Review of Systems  Constitutional: Negative for fever and chills.  Genitourinary: Positive for dysuria (4 days), urgency and frequency. Negative for hematuria, flank pain, decreased urine volume and discharge.       "burning pain in groin area"  All other systems reviewed and are negative.      Objective:   Physical Exam  Nursing note and vitals reviewed. Constitutional: He is oriented to person, place, and time. He appears well-developed and well-nourished. No distress.  HENT:  Head: Normocephalic and atraumatic.  Eyes: Conjunctivae and EOM are normal.  Neck: Neck supple. No tracheal deviation present.  Cardiovascular: Normal rate.   Pulmonary/Chest: Effort normal. No respiratory distress.  Genitourinary:  No external penile rash or discharge No hernia palpated but difficult with body habitus   Musculoskeletal: Normal range of motion.  Neurological: He is alert and oriented to person, place, and time.  Skin: Skin is warm and dry.  Psychiatric: He has a normal mood and affect. His behavior is normal.    Filed Vitals:   11/25/13 1617  BP: 140/80  Pulse: 74  Temp: 98.2 F (36.8 C)  TempSrc: Oral  Resp: 20  Height: _0  (1.727 m)  Weight: 389 lb 12.8 oz (176.812 kg)  SpO2: 94%   Results for orders placed in visit on 11/25/13  POCT UA - MICROSCOPIC ONLY      Result Value Ref Range   WBC, Ur, HPF, POC 20-25     RBC, urine, microscopic 1-3     Bacteria, U Microscopic trace     Mucus, UA neg     Epithelial cells, urine per micros 1-3     Crystals, Ur, HPF, POC neg     Casts, Ur, LPF, POC neg     Yeast, UA neg    POCT URINALYSIS DIPSTICK      Result Value Ref Range   Color, UA yellow     Clarity, UA cloudy     Glucose, UA neg     Bilirubin, UA neg     Ketones, UA neg     Spec Grav, UA 1.015     Blood, UA trace     pH, UA 5.5     Protein, UA neg     Urobilinogen, UA 0.2      Nitrite, UA neg     Leukocytes,  UA small (1+)            Assessment & Plan:   Henry Newsham. is a 53 y.o. male Burning with urination , Increased frequency of urination - Plan: POCT UA - Microscopic Only, POCT urinalysis dipstick, Urine culture, PSA Urinary tract infection, site not specified - Plan: ciprofloxacin (CIPRO) 500 MG tablet  - UTI, complicated as in male. Will check PSA, but no specific prostatic sx's at present.   Urine culture, and psa pending.    Groin pain, right, Meralgia paraesthetica, right  - suspected lateral cutaneous nn. Syndrome., no apparent hernia, but difficult exam. Less likely groin strain.  If not improving with less tight waistband, recheck. rtc precautions.    Meds ordered this encounter  Medications  . ciprofloxacin (CIPRO) 500 MG tablet    Sig: Take 1 tablet (500 mg total) by mouth 2 (two) times daily.    Dispense:  20 tablet    Refill:  0   Patient Instructions  You should receive a call or letter about your lab results within the next week to 10 days. Start antibiotic.  If prostate test elevated, we may need to extend this to two weeks.  Return to the clinic or go to the nearest emergency room if any of your symptoms worsen or new symptoms occur.   Try to avoid tight waistbands, as pain on leg may be "Meralgia Parasthetica" or "lateral femoral cutaneous nerve syndrome".  Recheck for groin pain in next few weeks if not improving - sooner if worse.      Urinary Tract Infection Urinary tract infections (UTIs) can develop anywhere along your urinary tract. Your urinary tract is your body's drainage system for removing wastes and extra water. Your urinary tract includes two kidneys, two ureters, a bladder, and a urethra. Your kidneys are a pair of bean-shaped organs. Each kidney is about the size of your fist. They are located below your ribs, one on each side of your spine. CAUSES Infections are caused by microbes, which are microscopic  organisms, including fungi, viruses, and bacteria. These organisms are so small that they can only be seen through a microscope. Bacteria are the microbes that most commonly cause UTIs. SYMPTOMS  Symptoms of UTIs may vary by age and gender of the patient and by the location of the infection. Symptoms in young women typically include a frequent and intense urge to urinate and a painful, burning feeling in the bladder or urethra during urination. Older women and men are more likely to be tired, shaky, and weak and have muscle aches and abdominal pain. A fever may mean the infection is in your kidneys. Other symptoms of a kidney infection include pain in your back or sides below the ribs, nausea, and vomiting. DIAGNOSIS To diagnose a UTI, your caregiver will ask you about your symptoms. Your caregiver also will ask to provide a urine sample. The urine sample will be tested for bacteria and white blood cells. White blood cells are made by your body to help fight infection. TREATMENT  Typically, UTIs can be treated with medication. Because most UTIs are caused by a bacterial infection, they usually can be treated with the use of antibiotics. The choice of antibiotic and length of treatment depend on your symptoms and the type of bacteria causing your infection. HOME CARE INSTRUCTIONS  If you were prescribed antibiotics, take them exactly as your caregiver instructs you. Finish the medication even if you feel better after you have only  taken some of the medication.  Drink enough water and fluids to keep your urine clear or pale yellow.  Avoid caffeine, tea, and carbonated beverages. They tend to irritate your bladder.  Empty your bladder often. Avoid holding urine for long periods of time.  Empty your bladder before and after sexual intercourse.  After a bowel movement, women should cleanse from front to back. Use each tissue only once. SEEK MEDICAL CARE IF:   You have back pain.  You develop a  fever.  Your symptoms do not begin to resolve within 3 days. SEEK IMMEDIATE MEDICAL CARE IF:   You have severe back pain or lower abdominal pain.  You develop chills.  You have nausea or vomiting.  You have continued burning or discomfort with urination. MAKE SURE YOU:   Understand these instructions.  Will watch your condition.  Will get help right away if you are not doing well or get worse. Document Released: 01/25/2005 Document Revised: 10/17/2011 Document Reviewed: 05/26/2011 [facility] Patient Information 2015 [place], [place]. This information is not intended to replace advice given to you by your health care provider. Make sure you discuss any questions you have with your health care provider.     I personally performed the services described in this documentation, which was scribed in my presence. The recorded information has been reviewed and considered, and addended by me as needed.

## 2013-11-25 NOTE — Patient Instructions (Addendum)
You should receive a call or letter about your lab results within the next week to 10 days. Start antibiotic.  If prostate test elevated, we may need to extend this to two weeks.  Return to the clinic or go to the nearest emergency room if any of your symptoms worsen or new symptoms occur.   Try to avoid tight waistbands, as pain on leg may be "Meralgia Parasthetica" or "lateral femoral cutaneous nerve syndrome".  Recheck for groin pain in next few weeks if not improving - sooner if worse.      Urinary Tract Infection Urinary tract infections (UTIs) can develop anywhere along your urinary tract. Your urinary tract is your body's drainage system for removing wastes and extra water. Your urinary tract includes two kidneys, two ureters, a bladder, and a urethra. Your kidneys are a pair of bean-shaped organs. Each kidney is about the size of your fist. They are located below your ribs, one on each side of your spine. CAUSES Infections are caused by microbes, which are microscopic organisms, including fungi, viruses, and bacteria. These organisms are so small that they can only be seen through a microscope. Bacteria are the microbes that most commonly cause UTIs. SYMPTOMS  Symptoms of UTIs may vary by age and gender of the patient and by the location of the infection. Symptoms in young women typically include a frequent and intense urge to urinate and a painful, burning feeling in the bladder or urethra during urination. Older women and men are more likely to be tired, shaky, and weak and have muscle aches and abdominal pain. A fever may mean the infection is in your kidneys. Other symptoms of a kidney infection include pain in your back or sides below the ribs, nausea, and vomiting. DIAGNOSIS To diagnose a UTI, your caregiver will ask you about your symptoms. Your caregiver also will ask to provide a urine sample. The urine sample will be tested for bacteria and white blood cells. White blood cells are  made by your body to help fight infection. TREATMENT  Typically, UTIs can be treated with medication. Because most UTIs are caused by a bacterial infection, they usually can be treated with the use of antibiotics. The choice of antibiotic and length of treatment depend on your symptoms and the type of bacteria causing your infection. HOME CARE INSTRUCTIONS  If you were prescribed antibiotics, take them exactly as your caregiver instructs you. Finish the medication even if you feel better after you have only taken some of the medication.  Drink enough water and fluids to keep your urine clear or pale yellow.  Avoid caffeine, tea, and carbonated beverages. They tend to irritate your bladder.  Empty your bladder often. Avoid holding urine for long periods of time.  Empty your bladder before and after sexual intercourse.  After a bowel movement, women should cleanse from front to back. Use each tissue only once. SEEK MEDICAL CARE IF:   You have back pain.  You develop a fever.  Your symptoms do not begin to resolve within 3 days. SEEK IMMEDIATE MEDICAL CARE IF:   You have severe back pain or lower abdominal pain.  You develop chills.  You have nausea or vomiting.  You have continued burning or discomfort with urination. MAKE SURE YOU:   Understand these instructions.  Will watch your condition.  Will get help right away if you are not doing well or get worse. Document Released: 01/25/2005 Document Revised: 10/17/2011 Document Reviewed: 05/26/2011 [employer] Patient Information 2015  [employer]. This information is not intended to replace advice given to you by your health care provider. Make sure you discuss any questions you have with your health care provider.

## 2013-11-27 LAB — PSA: PSA: 0.39 ng/mL (ref <=4.00)

## 2013-11-27 LAB — URINE CULTURE

## 2013-12-06 ENCOUNTER — Ambulatory Visit (INDEPENDENT_AMBULATORY_CARE_PROVIDER_SITE_OTHER): Payer: BC Managed Care – PPO | Admitting: Family Medicine

## 2013-12-06 VITALS — BP 125/77 | HR 63 | Temp 98.5°F | Resp 16 | Ht 68.0 in | Wt 392.5 lb

## 2013-12-06 DIAGNOSIS — R300 Dysuria: Secondary | ICD-10-CM

## 2013-12-06 DIAGNOSIS — R1031 Right lower quadrant pain: Secondary | ICD-10-CM

## 2013-12-06 DIAGNOSIS — R109 Unspecified abdominal pain: Secondary | ICD-10-CM

## 2013-12-06 LAB — POCT URINALYSIS DIPSTICK
BILIRUBIN UA: NEGATIVE
Glucose, UA: NEGATIVE
KETONES UA: NEGATIVE
Leukocytes, UA: NEGATIVE
Nitrite, UA: NEGATIVE
Protein, UA: NEGATIVE
Spec Grav, UA: 1.02
UROBILINOGEN UA: 0.2
pH, UA: 5.5

## 2013-12-06 LAB — POCT UA - MICROSCOPIC ONLY
BACTERIA, U MICROSCOPIC: NEGATIVE
CRYSTALS, UR, HPF, POC: NEGATIVE
Casts, Ur, LPF, POC: NEGATIVE
MUCUS UA: NEGATIVE
WBC, UR, HPF, POC: NEGATIVE
Yeast, UA: NEGATIVE

## 2013-12-06 NOTE — Progress Notes (Signed)
Subjective:    Patient ID: Henry Hinton., male    DOB: [birth year], 53 y.o.   MRN: [id number]  HPI Henry Hinton. is a 53 y.o. male  Here for follow up of dysuria.  Seen 11/25/13 with dysuria for 4 days ,with associated frequency/urgency.  Denied any new sexual contacts, nor h/o of STI's. denied any penile discharge, penile rash, hematuria, flank pain, fever, chills, or difficulty voiding urine. Treated with Cipro 523m BID for 10 days.  PSA ok and multiple bacterial types on urine culture. Here for recheck. Last dose of Cipro yesterday.  No side effects on this. Burning with urination resolved about 4-5 days ago.  No fever, no n/v. Denies urgency/frequncy now.   Still some groin pain - R sided.  Suspected Meralgia Parasthetica vs groin strain.  Notes soreness 1st thing in morning when getting up only. Tightness in R groin, feels groin strain in the morning - lower abdomen into groin. Not every day, but off and on over past month.  No pain as day goes on, no pain with swimming or walking. No specific treatments other than stretch occasionally. No tight waistbands. Not sure if sleeping on that side. Has had total hip placements bilaterally - right June '08, left - April '09.   Patient Active Problem List   Diagnosis Date Noted  . HNP (herniated nucleus pulposus), cervical 01/21/2013  . bilat hip replacement 04/08/2012  . Morbid obesity 04/08/2012  . Edema 04/08/2012   Past Medical History  Diagnosis Date  . Family history of anesthesia complication     " father having delirium after anesthesia"  . Arthritis   . Numbness and tingling     Hx: of in right arm  . Heart murmur     Hx; of as a child  . Joint pain     Hx: of periodically  . Sleep apnea   . Pneumonia   . Kidney stones     Hx; of as a child  . Anemia    Past Surgical History  Procedure Laterality Date  . Lumbar laminectomy      Hx: of 2005  . Joint replacement    . Total hip arthroplasty      Hx: of B/L hips    . Tenosynovitis      Hx: of thumb  . Tonsillectomy    . Colonoscopy      Hx: of  . Posterior cervical laminectomy with met- rx Right 01/21/2013    Procedure: Right Sitting C6-7 Microdiskectomy with Met-rex;  Surgeon: HKristeen Miss MD;  [facility];  Service: Neurosurgery;  Laterality: Right;  Right Sitting C6-7 Microdiskectomy with Met-rex  . Spine surgery     No Known Allergies Prior to Admission medications   Medication Sig Start Date End Date Taking? Authorizing Provider  aspirin 325 MG tablet Take 325 mg by mouth daily.   Yes Historical Provider, MD  docusate sodium (COLACE) 100 MG capsule Take 100 mg by mouth daily.   Yes Historical Provider, MD  ferrous sulfate (IRON SUPPLEMENT) 325 (65 FE) MG tablet Take 325 mg by mouth daily with breakfast.   Yes Historical Provider, MD  furosemide (LASIX) 40 MG tablet Take 1 tablet (40 mg total) by mouth daily. 05/05/13  Yes JWendie Agreste MD  HYDROcodone-acetaminophen (NORCO) 5-325 MG per tablet Take 1 tablet by mouth every 6 (six) hours as needed for pain. 02/18/13  Yes Thao P Le, DO  ibuprofen (ADVIL,MOTRIN) 200 MG  tablet Take 400 mg by mouth every 6 (six) hours as needed for pain.   Yes Historical Provider, MD  Multiple Vitamins-Minerals (MENS 50+ MULTI VITAMIN/MIN) TABS Take 1 tablet by mouth daily.    Yes Historical Provider, MD  potassium chloride (K-DUR) 10 MEQ tablet Take 10 mEq by mouth daily.   Yes Historical Provider, MD  potassium chloride (K-DUR,KLOR-CON) 10 MEQ tablet TAKE 1 TABLET BY MOUTH DAILY 05/05/13  Yes [name], MD   History   Social History  . Marital Status: Divorced    Spouse Name: N/A    Number of Children: N/A  . Years of Education: N/A   Occupational History  . 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 Smokeless tobacco: Former Systems developer    Types: [place] date: 05/01/1993  . Alcohol Use: 0.5 oz/week    1 drink(s) per  week     Comment: BE[employer]QU[employer]  . Drug Use: No  . Sexual Activity: No   Other Topics Concern  . Not on file   Social History Narrative  . No narrative on file      Review of Systems  Constitutional: Negative for fever and chills.  Gastrointestinal: Negative for abdominal pain.  Genitourinary: Negative for dysuria, urgency, frequency and difficulty urinating.  Musculoskeletal: Positive for arthralgias.       Objective:   Physical Exam  Vitals reviewed. Constitutional: He is oriented to person, place, and time. He appears well-developed and well-nourished. No distress.  HENT:  Head: Normocephalic and atraumatic.  Pulmonary/Chest: Effort normal.  Abdominal: Soft. Bowel sounds are normal. There is no tenderness.  Musculoskeletal:       Right hip: He exhibits normal range of motion, normal strength, no tenderness (but some soreness/stiffness with resisted flex and abduction. no click. ) and no bony tenderness.  Neurological: He is alert and oriented to person, place, and time.  Skin: Skin is warm and dry.   Filed Vitals:   12/06/13 1426  BP: 125/77  Pulse: 63  Temp: 98.5 F (36.9 C)  TempSrc: Oral  Resp: 16  Height: '5\' 8"'  (1.727 m)  Weight: 392 lb 8 oz (178.037 kg)  SpO2: 97%      Results for orders placed in visit on 12/06/13  POCT URINALYSIS DIPSTICK      Result Value Ref Range   Color, UA yellow     Clarity, UA clear     Glucose, UA neg     Bilirubin, UA neg     Ketones, UA neg     Spec Grav, UA 1.020     Blood, UA trace     pH, UA 5.5     Protein, UA neg     Urobilinogen, UA 0.2     Nitrite, UA neg     Leukocytes, UA Negative    POCT UA - MICROSCOPIC ONLY      Result Value Ref Range   WBC, Ur, HPF, POC neg     RBC, urine, microscopic 0-1     Bacteria, U Microscopic neg     Mucus, UA neg     Epithelial cells, urine per micros 0-2     Crystals, Ur, HPF, POC neg     Casts, Ur, LPF, POC neg     Yeast, UA neg         Assessment & Plan:    Alon Mazor. is  a 53 y.o. male Dysuria - Plan: POCT urinalysis dipstick, POCT UA - Microscopic Only  - resolved. If recurs, recollect U/A and cx and may need urology eval.    Groin pain, right  - groin strain vs less likely meralgia parasthetica. Sx care stretches, rtc precautions.   No orders of the defined types were placed in this encounter.   Patient Instructions  If any return of urinary symptoms, or if groin symptoms not improving in next few weeks - recheck.

## 2013-12-06 NOTE — Patient Instructions (Signed)
If any return of urinary symptoms, or if groin symptoms not improving in next few weeks - recheck.

## 2014-01-12 ENCOUNTER — Other Ambulatory Visit: Payer: Self-pay | Admitting: *Deleted

## 2014-01-12 ENCOUNTER — Telehealth: Payer: Self-pay | Admitting: Sports Medicine

## 2014-01-12 DIAGNOSIS — M25562 Pain in left knee: Principal | ICD-10-CM

## 2014-01-12 DIAGNOSIS — M25561 Pain in right knee: Secondary | ICD-10-CM

## 2014-01-12 NOTE — Telephone Encounter (Signed)
I spoke with the patient over the weekend. He is struggling with bilateral knee pain. Pain is diffuse and worse with activity. I suggested that we start with some plain x-rays of his knees and go from there. I will followup with him after I've  reviewed those x-rays.

## 2014-01-16 ENCOUNTER — Ambulatory Visit
Admission: RE | Admit: 2014-01-16 | Discharge: 2014-01-16 | Disposition: A | Discharge status: Home / Self Care | Payer: BC Managed Care – PPO | Source: Ambulatory Visit | Attending: Sports Medicine | Admitting: Sports Medicine

## 2014-01-16 DIAGNOSIS — M25561 Pain in right knee: Secondary | ICD-10-CM

## 2014-01-16 DIAGNOSIS — M25562 Pain in left knee: Principal | ICD-10-CM

## 2014-01-23 ENCOUNTER — Telehealth: Payer: Self-pay | Admitting: *Deleted

## 2014-01-23 ENCOUNTER — Telehealth: Payer: Self-pay | Admitting: Sports Medicine

## 2014-01-23 NOTE — Telephone Encounter (Signed)
cld pt about referral to Dr. Noemi Chapel for his knee.  The appt is 03/09/14 @ 4pm

## 2014-01-23 NOTE — Telephone Encounter (Signed)
I spoke with the patient after reviewing the x-rays of both knees. He has degenerative changes in both knees particularly in the patellofemoral joint. His main complaint is locking and catching in the left knee. The left knee x-ray shows what may potentially be a loose body. He is getting what sounds like true mechanical symptoms. Therefore, I will refer him to Dr. Noemi Chapel for further evaluation and treatment. If he truly has a loose body he may benefit from arthroscopy.

## 2014-04-13 ENCOUNTER — Ambulatory Visit (INDEPENDENT_AMBULATORY_CARE_PROVIDER_SITE_OTHER): Payer: BC Managed Care – PPO | Admitting: Internal Medicine

## 2014-04-13 ENCOUNTER — Telehealth: Payer: Self-pay

## 2014-04-13 VITALS — BP 142/72 | HR 80 | Temp 98.4°F | Resp 16 | Ht 68.0 in | Wt 388.0 lb

## 2014-04-13 DIAGNOSIS — L03311 Cellulitis of abdominal wall: Secondary | ICD-10-CM

## 2014-04-13 MED ORDER — SULFAMETHOXAZOLE-TRIMETHOPRIM 800-160 MG PO TABS: 1.0000 | ORAL_TABLET | Freq: Two times a day (BID) | ORAL | Status: DC

## 2014-04-13 NOTE — Telephone Encounter (Signed)
Pt of Dr. Carlota Raspberry is havving surgery on Wednesday, and has developed a skin lesion and thinks he may need to be on antibiotic. Please advise

## 2014-04-13 NOTE — Telephone Encounter (Signed)
LM for pt to RTC for abx prescription

## 2014-04-14 NOTE — Progress Notes (Signed)
   Subjective:    Patient ID: Henry Hinton., male    DOB: Dec 27, 1960, 53 y.o.   MRN: [id number]  HPI  Chief Complaint  Patient presents with  . Mass    right hip, x 3 days   tender swelling R lower abdominal wall anteriorly 2-3 days Inflamed Drained pus from this yesterday Past history of various staph infections Schedule for arthroscopy on Wednesday--left knee--Dr. Percell Miller     Review of Systems No fever or chills No other symptoms of illness    Objective:   Physical Exam Morbidly obese BP 142/72 mmHg  Pulse 80  Temp(Src) 98.4 F (36.9 C)  Resp 16  Ht 5\' 8"  (1.727 m)  Wt 388 lb (175.996 kg)  BMI 59.01 kg/m2  SpO2 96% Right lower quadrant area with a 1.5 cm erythematous area lacking induration but with an open sore that is scabbed and has scant pus. This is mildly tender and there is no evidence of a deep abscess.         Assessment & Plan:  Mild cellulitis Cultured  Meds ordered this encounter  Medications  . sulfamethoxazole-trimethoprim (BACTRIM DS,SEPTRA DS) 800-160 MG per tablet    Sig: Take 1 tablet by mouth 2 (two) times daily.    Dispense:  20 tablet    Refill:  0

## 2014-04-25 ENCOUNTER — Ambulatory Visit (INDEPENDENT_AMBULATORY_CARE_PROVIDER_SITE_OTHER): Payer: BC Managed Care – PPO

## 2014-04-25 ENCOUNTER — Ambulatory Visit (INDEPENDENT_AMBULATORY_CARE_PROVIDER_SITE_OTHER): Payer: BC Managed Care – PPO | Admitting: Physician Assistant

## 2014-04-25 VITALS — BP 138/92 | HR 75 | Temp 97.4°F | Resp 18 | Ht 69.0 in | Wt 389.4 lb

## 2014-04-25 DIAGNOSIS — R0989 Other specified symptoms and signs involving the circulatory and respiratory systems: Secondary | ICD-10-CM

## 2014-04-25 DIAGNOSIS — R059 Cough, unspecified: Secondary | ICD-10-CM

## 2014-04-25 DIAGNOSIS — R05 Cough: Secondary | ICD-10-CM

## 2014-04-25 DIAGNOSIS — R079 Chest pain, unspecified: Secondary | ICD-10-CM

## 2014-04-25 DIAGNOSIS — R0981 Nasal congestion: Secondary | ICD-10-CM

## 2014-04-25 MED ORDER — AMOXICILLIN-POT CLAVULANATE 875-125 MG PO TABS: 1.0000 | ORAL_TABLET | Freq: Two times a day (BID) | ORAL | Status: DC

## 2014-04-25 MED ORDER — BENZONATATE 100 MG PO CAPS: 100.0000 mg | ORAL_CAPSULE | Freq: Three times a day (TID) | ORAL | Status: DC | PRN

## 2014-04-25 NOTE — Patient Instructions (Addendum)
Your chest xray looked normal compared to a 2014 chest xray per the radiologist.  Since you had such bad sinus pressure we are treating you for sinusitis. Please take the augmentin twice daily for 10 days.  If you do not feel better in the next 4-5 days please return to clinic.

## 2014-04-25 NOTE — Progress Notes (Signed)
Subjective:    Patient ID: Henry Hinton, male    DOB: 02/14/1961, 53 y.o.   MRN: [id number]  PCP: [name], MD  Chief Complaint  Patient presents with  . Cough    thick yellow sputum x1 day  . Chest Pain  . Headache   Patient Active Problem List   Diagnosis Date Noted  . HNP (herniated nucleus pulposus), cervical 01/21/2013  . bilat hip replacement 04/08/2012  . Morbid obesity 04/08/2012  . Edema 04/08/2012   Prior to Admission medications   Medication Sig Start Date End Date Taking? Authorizing Provider  aspirin 325 MG tablet Take 325 mg by mouth daily.   Yes Historical Provider, MD  ferrous sulfate (IRON SUPPLEMENT) 325 (65 FE) MG tablet Take 325 mg by mouth daily with breakfast.   Yes Historical Provider, MD  ibuprofen (ADVIL,MOTRIN) 200 MG tablet Take 400 mg by mouth every 6 (six) hours as needed for pain.   Yes Historical Provider, MD  Multiple Vitamins-Minerals (MENS 50+ MULTI VITAMIN/MIN) TABS Take 1 tablet by mouth daily.    Yes Historical Provider, MD  potassium chloride (K-DUR,KLOR-CON) 10 MEQ tablet TAKE 1 TABLET BY MOUTH DAILY 05/05/13  Yes [name], MD  docusate sodium (COLACE) 100 MG capsule Take 100 mg by mouth daily.    Historical Provider, MD  furosemide (LASIX) 40 MG tablet Take 1 tablet (40 mg total) by mouth daily. Patient not taking: Reported on 04/25/2014 05/05/13   [name], MD  HYDROcodone-acetaminophen [facility]) 5-325 MG per tablet Take 1 tablet by mouth every 6 (six) hours as needed for pain. Patient not taking: Reported on 04/25/2014 02/18/13   Thao P Le, DO   Medications, allergies, past medical history, surgical history, family history, social history and problem list reviewed and updated.  HPI  53 yo obese male with no pertinent pmh presents with cough, ha, and cp that started yest.   Was around his mother last couple days who was on abx for uri. Yest the pt started having non prod cough along with frontal ha, head and nasal  congestion. The sx persisted through the night and kept him up. He continues to cough today and has been prod with yellow sputum. Head congestion has worsened today and is incredibly painful for him. Denies abd pain, n/v, diarrhea, otalgia, sore throat.   He also mentions CP that started this am after long night of coughing. This is located across his chest and comes on when he coughs. He was seen here 14 months ago for cp. Was referred to Dr Einar Gip and per pt had negative echo and stress test. Pt did not have any more cp after that until it came on again this am after the coughing. He swims daily without cp.   Denies any recent immobilization, leg pain or swelling. No hemoptysis or pleurisy.   He was seen here for abd wall cellulitis recently, tx with bactrim, resolved.   BP slightly elevated today, has been slightly elevated at prior appts. Pt attributes this to sickness.   Review of Systems No fever, chills, dysuria.     Objective:   Physical Exam  Constitutional: He is oriented to person, place, and time. He appears well-developed and well-nourished.  Non-toxic appearance. He does not have a sickly appearance. He appears ill. No distress.  BP 138/92 mmHg  Pulse 75  Temp(Src) 97.4 F (36.3 C) (Oral)  Resp 18  Ht 5\' 9"  (1.753 m)  Wt 389 lb 6.4 oz (  176.631 kg)  BMI 57.48 kg/m2  SpO2 95%   HENT:  Right Ear: Tympanic membrane normal.  Left Ear: Tympanic membrane normal.  Nose: Mucosal edema and rhinorrhea present. Right sinus exhibits maxillary sinus tenderness. Right sinus exhibits no frontal sinus tenderness. Left sinus exhibits maxillary sinus tenderness. Left sinus exhibits no frontal sinus tenderness.  Mouth/Throat: Uvula is midline, oropharynx is clear and moist and mucous membranes are normal. No oropharyngeal exudate, posterior oropharyngeal edema, posterior oropharyngeal erythema or tonsillar abscesses.  Maxillary TTP bilaterally, severe on left.   Cardiovascular: Normal rate,  regular rhythm and normal heart sounds.  Exam reveals no gallop.   No murmur heard. Pulmonary/Chest: Effort normal. No tachypnea. He has no decreased breath sounds. He has wheezes in the left middle field. He has rhonchi in the right upper field, the right lower field, the left upper field and the left middle field. He has no rales.  Lymphadenopathy:       Head (right side): No submental, no submandibular and no tonsillar adenopathy present.       Head (left side): No submental, no submandibular and no tonsillar adenopathy present.    He has no cervical adenopathy.  Neurological: He is alert and oriented to person, place, and time.  Psychiatric: He has a normal mood and affect. His speech is normal.   [facility] reading (PRIMARY) by  Dr. Everlene Farrier. Findings: Right infrahilar infiltrate with elevated right diaphragm.  Stat read requested by Dr Everlene Farrier.   Radiology read: CLINICAL DATA: 53 year old male with abnormal lung sounds, cough, shortness of Breath. Initial encounter.  EXAM: CHEST 2 VIEW  COMPARISON: 02/18/2013 and earlier.  FINDINGS: Stable lung volumes, within normal limits. Normal cardiac size and mediastinal contours. Visualized tracheal air column is within normal limits. No pneumothorax or pulmonary edema. No pleural effusion or consolidation. No acute or confluent pulmonary opacity. Flowing osteophytes or syndesmophytes in the spine. No acute osseous abnormality identified.  IMPRESSION: Stable radiographic appearance of the chest since 2014. No acute cardiopulmonary abnormality.   Electronically Signed  By: Lars Pinks M.D.  On: 04/25/2014 15:56     Assessment & Plan:   53 yo obese male with no pertinent pmh presents with cough, ha, and cp that started yest.   Abnormal lung sounds - Plan: DG Chest 2 View Cough - Plan: DG Chest 2 View --crackles and occasional wheeze on exam --cxr unchanged from 2014 per radiology read --tessalon  Chest pain, unspecified chest  pain type --not likely cardiac related as started this am after coughing last night --no exertional cp past year, no abnormal doe --negative ischemic workup 14 months ago  Head congestion - Plan: amoxicillin-clavulanate (AUGMENTIN) 875-125 MG per tablet --severe ttp over left maxillary sinus --augmentin 10 days --mucinex, fluids, rest  Julieta Gutting, PA-C Physician Assistant-Certified [facility]  04/25/2014 4:08 PM

## 2014-06-03 ENCOUNTER — Other Ambulatory Visit: Payer: Self-pay | Admitting: Family Medicine

## 2014-06-04 ENCOUNTER — Other Ambulatory Visit: Payer: Self-pay

## 2014-07-04 ENCOUNTER — Other Ambulatory Visit: Payer: Self-pay | Admitting: Family Medicine

## 2014-07-19 ENCOUNTER — Other Ambulatory Visit: Payer: Self-pay | Admitting: Family Medicine

## 2014-07-21 HISTORY — PX: KNEE ARTHROSCOPY: SHX127

## 2014-08-27 ENCOUNTER — Ambulatory Visit (INDEPENDENT_AMBULATORY_CARE_PROVIDER_SITE_OTHER): Payer: BC Managed Care – PPO | Admitting: Family Medicine

## 2014-08-27 VITALS — BP 150/80 | HR 62 | Temp 98.0°F | Resp 20 | Ht 69.0 in | Wt 383.0 lb

## 2014-08-27 DIAGNOSIS — D72820 Lymphocytosis (symptomatic): Secondary | ICD-10-CM

## 2014-08-27 DIAGNOSIS — R600 Localized edema: Secondary | ICD-10-CM | POA: Diagnosis not present

## 2014-08-27 DIAGNOSIS — D72829 Elevated white blood cell count, unspecified: Secondary | ICD-10-CM | POA: Diagnosis not present

## 2014-08-27 DIAGNOSIS — Z8042 Family history of malignant neoplasm of prostate: Secondary | ICD-10-CM

## 2014-08-27 DIAGNOSIS — E876 Hypokalemia: Secondary | ICD-10-CM | POA: Diagnosis not present

## 2014-08-27 DIAGNOSIS — E669 Obesity, unspecified: Secondary | ICD-10-CM | POA: Diagnosis not present

## 2014-08-27 DIAGNOSIS — Z125 Encounter for screening for malignant neoplasm of prostate: Secondary | ICD-10-CM | POA: Diagnosis not present

## 2014-08-27 LAB — POCT CBC
Granulocyte percent: 24.7 %G — AB (ref 37–80)
HEMATOCRIT: 41.9 % — AB (ref 43.5–53.7)
HEMOGLOBIN: 13.4 g/dL — AB (ref 14.1–18.1)
LYMPH, POC: 16.3 — AB (ref 0.6–3.4)
MCH, POC: 27.4 pg (ref 27–31.2)
MCHC: 31.9 g/dL (ref 31.8–35.4)
MCV: 85.7 fL (ref 80–97)
MID (CBC): 0.6 (ref 0–0.9)
MPV: 8.8 fL (ref 0–99.8)
POC GRANULOCYTE: 5.6 (ref 2–6.9)
POC LYMPH %: 72.5 % — AB (ref 10–50)
POC MID %: 2.8 %M (ref 0–12)
Platelet Count, POC: 162 10*3/uL (ref 142–424)
RBC: 4.89 M/uL (ref 4.69–6.13)
RDW, POC: 16.8 %
WBC: 22.5 10*3/uL — AB (ref 4.6–10.2)

## 2014-08-27 MED ORDER — FUROSEMIDE 40 MG PO TABS: 40.0000 mg | ORAL_TABLET | Freq: Every day | ORAL | Status: DC

## 2014-08-27 MED ORDER — POTASSIUM CHLORIDE ER 10 MEQ PO TBCR: EXTENDED_RELEASE_TABLET | ORAL | Status: DC

## 2014-08-27 NOTE — Progress Notes (Addendum)
Subjective:    Patient ID: Henry Hinton, male    DOB: 1960-11-16, 53 y.o.   MRN: [id number] This chart was scribed for [name], MD by Zola Button, Medical Scribe. This patient was seen in Room 12 and the patient's care was started at 4:45 PM.    HPI HPI Comments: Henry Hinton is a 54 y.o. male who presents to the [facility] for a medication refill.   Last seen by me in August, 2015. He has a hx of lower extremity edema with overlying obesity. Weight is down from 392 lbs in August, 2015 to 383 lbs today. Takes Lasix 40 mg a day and potassium supplementation due to use of Lasix. Last CMP was in January of 2015, was normal.   Leukocytosis: In January, 2015, WBC count was 12.8, and 13.4 in 12/2012. Had future order for repeat CBC  - has not had this done. Denies fever, no recent illness.  No cough or chest pains. No dysuria or frequency. No difficulty with urinary stream. No new rash - only darkened area on front of left leg, but no redness or warmth.  No unexplained wt loss or night sweats. No FH of blood malignancy/leukemia.  No stool changes, but has BM Q 3 days - constipation at times.   He had a left knee arthroscopy 1 month ago done by Dr. Noemi Chapel for degenerative changes and plica. He came off of Lasix in August last year because it caused leg cramping. He has had more leg swelling since coming off the Lasix, but he restarted Lasix about 1-2 months ago and has been taking it every day. He states he has not had leg cramping since restarting the Lasix. Patient reports having a hardened area of skin on his right leg since this past December, 4 months ago. He denies fever. He changed his diet to include more protein, and he has been monitoring his diet. Patient has been working with a nutritionist on his diet. He has not been to the pool since his surgery 1 month ago, but should be cleared to return soon. FMHx includes prostate cancer in his father.  Patient Active  Problem List   Diagnosis Date Noted  . HNP (herniated nucleus pulposus), cervical 01/21/2013  . bilat hip replacement 04/08/2012  . Morbid obesity 04/08/2012  . Edema 04/08/2012   Past Medical History  Diagnosis Date  . Family history of anesthesia complication     " father having delirium after anesthesia"  . Arthritis   . Numbness and tingling     Hx: of in right arm  . Heart murmur     Hx; of as a child  . Joint pain     Hx: of periodically  . Sleep apnea   . Pneumonia   . Kidney stones     Hx; of as a child  . Anemia    Past Surgical History  Procedure Laterality Date  . Lumbar laminectomy      Hx: of 2005  . Joint replacement    . Total hip arthroplasty      Hx: of B/L hips  . Tenosynovitis      Hx: of thumb  . Tonsillectomy    . Colonoscopy      Hx: of  . Posterior cervical laminectomy with met- rx Right 01/21/2013    Procedure: Right Sitting C6-7 Microdiskectomy with Met-rex;  Surgeon: Kristeen Miss, MD;  Location: [facility];  Service: Neurosurgery;  Laterality:  Right;  Right Sitting C6-7 Microdiskectomy with Met-rex  . Spine surgery    . Knee surgery Left 07/21/2014    Dr. Noemi Chapel   No Known Allergies Prior to Admission medications   Medication Sig Start Date End Date Taking? Authorizing Provider  amoxicillin-clavulanate (AUGMENTIN) 875-125 MG per tablet Take 1 tablet by mouth 2 (two) times daily. 04/25/14   Merlinda Frederick McVeigh, PA  aspirin 325 MG tablet Take 325 mg by mouth daily.    Historical Provider, MD  benzonatate (TESSALON) 100 MG capsule Take 1-2 capsules (100-200 mg total) by mouth 3 (three) times daily as needed for cough. 04/25/14   Merlinda Frederick McVeigh, PA  docusate sodium (COLACE) 100 MG capsule Take 100 mg by mouth daily.    Historical Provider, MD  ferrous sulfate (IRON SUPPLEMENT) 325 (65 FE) MG tablet Take 325 mg by mouth daily with breakfast.    Historical Provider, MD  furosemide (LASIX) 40 MG tablet Take 1 tablet (40 mg total) by mouth daily.  PATIENT NEEDS OFFICE VISIT FOR ADDITIONAL REFILLS 07/21/14   Robyn Haber, MD  HYDROcodone-acetaminophen [facility]) 5-325 MG per tablet Take 1 tablet by mouth every 6 (six) hours as needed for pain. Patient not taking: Reported on 04/25/2014 02/18/13   Thao P Le, DO  ibuprofen (ADVIL,MOTRIN) 200 MG tablet Take 400 mg by mouth every 6 (six) hours as needed for pain.    Historical Provider, MD  Multiple Vitamins-Minerals (MENS 50+ MULTI VITAMIN/MIN) TABS Take 1 tablet by mouth daily.     Historical Provider, MD  potassium chloride (K-DUR,KLOR-CON) 10 MEQ tablet TAKE 1 TABLET BY MOUTH DAILY 05/05/13   [name], MD  potassium chloride (KLOR-CON 10) 10 MEQ tablet TAKE 1 TABLET (10 MEQ TOTAL) BY MOUTH DAILY.  "NO MORE REFILLS WITHOUT OV/LAB" 07/05/14   Fara Chute, PA-C   History   Social History  . Marital Status: Divorced    Spouse Name: N/A  . Number of Children: N/A  . Years of Education: N/A   Occupational History  . Teacher/Athletic Trainer [employer] History Main Topics  . Smoking status: Never Smoker   . Smokeless tobacco: Former Systems developer    Types: [place] date: 05/01/1993  . Alcohol Use: 0.6 oz/week    1 Standard drinks or equivalent per week     Comment: [employer]  . Drug Use: No  . Sexual Activity: No   Other Topics Concern  . Not on file   Social History Narrative     Review of Systems  Constitutional: Negative for fever.       Objective:   Physical Exam  Constitutional: He is oriented to person, place, and time. He appears well-developed and well-nourished. No distress.  HENT:  Head: Normocephalic and atraumatic.  Mouth/Throat: Oropharynx is clear and moist. No oropharyngeal exudate.  Eyes: Pupils are equal, round, and reactive to light.  Neck: Neck supple.  Cardiovascular: Normal rate.   Pulses:      Dorsalis pedis pulses are 2+ on the right side, and 2+ on the left side.  Pulmonary/Chest: Effort normal.  Genitourinary:   Difficult exam due to body habitus. No prostate tenderness or nodules.  Musculoskeletal: He exhibits edema.  2+ edema lower extremities bilaterally with some thickened, hyperpigmented area anterior lower right leg without significant erythema or warmth. Toes are warm.  Neurological: He is alert and oriented to person, place, and time. No cranial nerve deficit.  Skin: Skin is  warm and dry. No rash noted. No erythema.  Psychiatric: He has a normal mood and affect. His behavior is normal.  Vitals reviewed.     Filed Vitals:   08/27/14 1614  BP: 150/80  Pulse: 62  Temp: 98 F (36.7 C)  TempSrc: Oral  Resp: 20  Height: _0  (1.753 m)  Weight: 383 lb (173.728 kg)  SpO2: 96%    Results for orders placed or performed in visit on 08/27/14  POCT CBC  Result Value Ref Range   WBC 22.5 (A) 4.6 - 10.2 K/uL   Lymph, poc 16.3 (A) 0.6 - 3.4   POC LYMPH PERCENT 72.5 (A) 10 - 50 %L   MID (cbc) 0.6 0 - 0.9   POC MID % 2.8 0 - 12 %M   POC Granulocyte 5.6 2 - 6.9   Granulocyte percent 24.7 (A) 37 - 80 %G   RBC 4.89 4.69 - 6.13 M/uL   Hemoglobin 13.4 (A) 14.1 - 18.1 g/dL   HCT, POC 41.9 (A) 43.5 - 53.7 %   MCV 85.7 80 - 97 fL   MCH, POC 27.4 27 - 31.2 pg   MCHC 31.9 31.8 - 35.4 g/dL   RDW, POC 16.8 %   Platelet Count, POC 162 142 - 424 K/uL   MPV 8.8 0 - 99.8 fL      Assessment & Plan:   Henry Hinton is a 54 y.o. male Lower leg edema - Plan: furosemide (LASIX) 40 MG tablet, potassium chloride (KLOR-CON 10) 10 MEQ tablet, COMPLETE METABOLIC PANEL WITH GFR  -cont lasix, elevate legs when seated. Weight loss should help this - continue to work on this.   -CMP pending.   -no apparent sign of cellulitis on exam - suspect statis hyperpigmentation.  If redness, warmth or other worsening - RTC for recheck.   Obesity  -working on wt loss with nutritionist.  Commended on these attempts.   Family history of prostate cancer, Screening for prostate cancer  - no concerns apparent on DRE  (limited some by body habitus), not yet due for repeat PSA. Plan on CPE in few months.   Leukocytosis with Lymphocytosis- Plan: POCT CBC, Sedimentation Rate, Pathologist smear review, Ambulatory referral to Hematology  -afebrile, no apparent infection.  Elevation from prior concerning.  Path review, check ESR, and refer to hematology. rtc if any fever or acute illness.    Hypokalemia - Plan: COMPLETE METABOLIC PANEL WITH GFR pending.    Meds ordered this encounter  Medications  . aspirin 81 MG tablet    Sig: Take 81 mg by mouth daily.  . furosemide (LASIX) 40 MG tablet    Sig: Take 1 tablet (40 mg total) by mouth daily.    Dispense:  90 tablet    Refill:  1  . potassium chloride (KLOR-CON 10) 10 MEQ tablet    Sig: TAKE 1 TABLET (10 MEQ TOTAL) BY MOUTH DAILY.    Dispense:  90 tablet    Refill:  1   Patient Instructions  Too soon for PSA- can chek this at physical in late July or August.  You should receive a call or letter about your lab results within the next week to 10 days.  Elevate legs at night and when seated as able. Low sodium diet as much as possible. If any redness or increased warmth of firm area on front of right leg - return as this may be infection.  Keep up the good work with the weight loss.  I will check a few other tests for the elevated blood count, but will also refer you to hematologist to determine other workup.  If any fever, new rash, cough, or urinary symptoms - return to have other testing sooner.   Return to the clinic or go to the nearest emergency room if any of your symptoms worsen or new symptoms occur.   Leukocytosis Leukocytosis means you have more white blood cells than normal. White blood cells are made in your bone marrow. The main job of white blood cells is to fight infection. Having too many white blood cells is a common condition. It can develop as a result of many types of medical problems. CAUSES  In some cases, your bone marrow may be  normal, but it is still making too many white blood cells. This could be the result of:  Infection.  Injury.  Physical stress.  Emotional stress.  Surgery.  Allergic reactions.  Tumors that do not start in the blood or bone marrow.  An inherited disease.  Certain medicines.  Pregnancy and labor. In other cases, you may have a bone marrow disorder that is causing your body to make too many white blood cells. Bone marrow disorders include:  Leukemia. This is a type of blood cancer.  Myeloproliferative disorders. These disorders cause blood cells to grow abnormally. SYMPTOMS  Some people have no symptoms. Others have symptoms due to the medical problem that is causing their leukocytosis. These symptoms may include:  Bleeding.  Bruising.  Fever.  Night sweats.  Repeated infections.  Weakness.  Weight loss. DIAGNOSIS  Leukocytosis is often found during blood tests that are done as part of a normal physical exam. Your caregiver will probably order other tests to help determine why you have too many white blood cells. These tests may include:  A complete blood count (CBC). This test measures all the types of blood cells in your body.  Chest X-rays, urine tests (urinalysis), or other tests to look for signs of infection.  Bone marrow aspiration. For this test, a needle is put into your bone. Cells from the bone marrow are removed through the needle. The cells are then examined under a microscope. TREATMENT  Treatment is usually not needed for leukocytosis. However, if a disorder is causing your leukocytosis, it will need to be treated. Treatment may include:  Antibiotic medicines if you have a bacterial infection.  Bone marrow transplant. Your diseased bone marrow is replaced with healthy cells that will grow new bone marrow.  Chemotherapy. This is the use of drugs to kill cancer cells. HOME CARE INSTRUCTIONS  Only take over-the-counter or prescription medicines as  directed by your caregiver.  Maintain a healthy weight. Ask your caregiver what weight is best for you.  Eat foods that are low in saturated fats and high in fiber. Eat plenty of fruits and vegetables.  Drink enough fluids to keep your urine clear or pale yellow.  Get 30 minutes of exercise at least 5 times a week. Check with your caregiver before starting a new exercise routine.  Limit caffeine and alcohol.  Do not smoke.  Keep all follow-up appointments as directed by your caregiver. SEEK MEDICAL CARE IF:  You feel weak or more tired than usual.  You develop chills, a cough, or nasal congestion.  You lose weight without trying.  You have night sweats.  You bruise easily. SEEK IMMEDIATE MEDICAL CARE IF:  You bleed more than normal.  You have chest pain.  You  have trouble breathing.  You have a fever.  You have uncontrolled nausea or vomiting.  You feel dizzy or lightheaded. MAKE SURE YOU:  Understand these instructions.  Will watch your condition.  Will get help right away if you are not doing well or get worse. Document Released: 04/06/2011 Document Revised: 07/10/2011 Document Reviewed: 04/06/2011 [facility] Patient Information 2015 [place], [place]. This information is not intended to replace advice given to you by your health care provider. Make sure you discuss any questions you have with your health care provider.     I personally performed the services described in this documentation, which was scribed in my presence. The recorded information has been reviewed and considered, and addended by me as needed.

## 2014-08-27 NOTE — Patient Instructions (Addendum)
Too soon for PSA- can chek this at physical in late July or August.  You should receive a call or letter about your lab results within the next week to 10 days.  Elevate legs at night and when seated as able. Low sodium diet as much as possible. If any redness or increased warmth of firm area on front of right leg - return as this may be infection.  Keep up the good work with the weight loss.   I will check a few other tests for the elevated blood count, but will also refer you to hematologist to determine other workup.  If any fever, new rash, cough, or urinary symptoms - return to have other testing sooner.   Return to the clinic or go to the nearest emergency room if any of your symptoms worsen or new symptoms occur.   Leukocytosis Leukocytosis means you have more white blood cells than normal. White blood cells are made in your bone marrow. The main job of white blood cells is to fight infection. Having too many white blood cells is a common condition. It can develop as a result of many types of medical problems. CAUSES  In some cases, your bone marrow may be normal, but it is still making too many white blood cells. This could be the result of:  Infection.  Injury.  Physical stress.  Emotional stress.  Surgery.  Allergic reactions.  Tumors that do not start in the blood or bone marrow.  An inherited disease.  Certain medicines.  Pregnancy and labor. In other cases, you may have a bone marrow disorder that is causing your body to make too many white blood cells. Bone marrow disorders include:  Leukemia. This is a type of blood cancer.  Myeloproliferative disorders. These disorders cause blood cells to grow abnormally. SYMPTOMS  Some people have no symptoms. Others have symptoms due to the medical problem that is causing their leukocytosis. These symptoms may include:  Bleeding.  Bruising.  Fever.  Night sweats.  Repeated infections.  Weakness.  Weight  loss. DIAGNOSIS  Leukocytosis is often found during blood tests that are done as part of a normal physical exam. Your caregiver will probably order other tests to help determine why you have too many white blood cells. These tests may include:  A complete blood count (CBC). This test measures all the types of blood cells in your body.  Chest X-rays, urine tests (urinalysis), or other tests to look for signs of infection.  Bone marrow aspiration. For this test, a needle is put into your bone. Cells from the bone marrow are removed through the needle. The cells are then examined under a microscope. TREATMENT  Treatment is usually not needed for leukocytosis. However, if a disorder is causing your leukocytosis, it will need to be treated. Treatment may include:  Antibiotic medicines if you have a bacterial infection.  Bone marrow transplant. Your diseased bone marrow is replaced with healthy cells that will grow new bone marrow.  Chemotherapy. This is the use of drugs to kill cancer cells. HOME CARE INSTRUCTIONS  Only take over-the-counter or prescription medicines as directed by your caregiver.  Maintain a healthy weight. Ask your caregiver what weight is best for you.  Eat foods that are low in saturated fats and high in fiber. Eat plenty of fruits and vegetables.  Drink enough fluids to keep your urine clear or pale yellow.  Get 30 minutes of exercise at least 5 times a week. Check with  your caregiver before starting a new exercise routine.  Limit caffeine and alcohol.  Do not smoke.  Keep all follow-up appointments as directed by your caregiver. SEEK MEDICAL CARE IF:  You feel weak or more tired than usual.  You develop chills, a cough, or nasal congestion.  You lose weight without trying.  You have night sweats.  You bruise easily. SEEK IMMEDIATE MEDICAL CARE IF:  You bleed more than normal.  You have chest pain.  You have trouble breathing.  You have a  fever.  You have uncontrolled nausea or vomiting.  You feel dizzy or lightheaded. MAKE SURE YOU:  Understand these instructions.  Will watch your condition.  Will get help right away if you are not doing well or get worse. Document Released: 04/06/2011 Document Revised: 07/10/2011 Document Reviewed: 04/06/2011 [facility] Patient Information 2015 [place], [place]. This information is not intended to replace advice given to you by your health care provider. Make sure you discuss any questions you have with your health care provider.

## 2014-08-28 LAB — COMPLETE METABOLIC PANEL WITH GFR
ALBUMIN: 4.1 g/dL (ref 3.5–5.2)
ALK PHOS: 91 U/L (ref 39–117)
ALT: 32 U/L (ref 0–53)
AST: 24 U/L (ref 0–37)
BILIRUBIN TOTAL: 0.5 mg/dL (ref 0.2–1.2)
BUN: 19 mg/dL (ref 6–23)
CALCIUM: 8.6 mg/dL (ref 8.4–10.5)
CO2: 25 mEq/L (ref 19–32)
CREATININE: 0.69 mg/dL (ref 0.50–1.35)
Chloride: 105 mEq/L (ref 96–112)
GFR, Est African American: >89 mL/min
GLUCOSE: 91 mg/dL (ref 70–99)
Potassium: 4.3 mEq/L (ref 3.5–5.3)
Sodium: 140 mEq/L (ref 135–145)
TOTAL PROTEIN: 6.7 g/dL (ref 6.0–8.3)

## 2014-08-28 LAB — SEDIMENTATION RATE: SED RATE: 7 mm/h (ref 0–20)

## 2014-08-29 DIAGNOSIS — D72829 Elevated white blood cell count, unspecified: Secondary | ICD-10-CM | POA: Insufficient documentation

## 2014-08-31 LAB — PATHOLOGIST SMEAR REVIEW

## 2014-09-30 DIAGNOSIS — I82409 Acute embolism and thrombosis of unspecified deep veins of unspecified lower extremity: Secondary | ICD-10-CM

## 2014-09-30 HISTORY — DX: Acute embolism and thrombosis of unspecified deep veins of unspecified lower extremity: I82.409

## 2014-10-23 HISTORY — PX: STENT PLACEMENT RT URETER (ARMC HX): HXRAD1255

## 2014-10-24 ENCOUNTER — Telehealth: Payer: Self-pay | Admitting: Radiology

## 2014-10-24 NOTE — Telephone Encounter (Signed)
Pt called 6/25. He is in hospital in [place], [place] with 10mm kidney stone. They did surgery, stent. He says there were also other multiple stones in kidneys. They wouldn't let him go from there due to his leukocytosis. Wbc count there was 22. he called here to ask what his last few cbc's and I gave him that info. He states that he never went to see hematology because "they never called him". I read notes in referrals saying that they had tried to contact him, but no answer and no cb. I urged pt it was important for him to see hematology since he has been having this ongoing leukocytosis. Pt expressed understanding. He also wanted me to send you a note letting you know he is in the hospital in [place].

## 2014-10-26 ENCOUNTER — Telehealth: Payer: Self-pay

## 2014-10-26 ENCOUNTER — Ambulatory Visit (HOSPITAL_COMMUNITY)
Admission: RE | Admit: 2014-10-26 | Discharge: 2014-10-26 | Disposition: A | Discharge status: Home / Self Care | Payer: BC Managed Care – PPO | Source: Ambulatory Visit | Attending: Family Medicine | Admitting: Family Medicine

## 2014-10-26 ENCOUNTER — Telehealth: Payer: Self-pay | Admitting: *Deleted

## 2014-10-26 ENCOUNTER — Ambulatory Visit (INDEPENDENT_AMBULATORY_CARE_PROVIDER_SITE_OTHER): Payer: BC Managed Care – PPO | Admitting: Family Medicine

## 2014-10-26 VITALS — BP 156/94 | HR 61 | Temp 98.0°F | Resp 18 | Ht 68.0 in | Wt 380.0 lb

## 2014-10-26 DIAGNOSIS — N10 Acute tubulo-interstitial nephritis: Secondary | ICD-10-CM

## 2014-10-26 DIAGNOSIS — N200 Calculus of kidney: Secondary | ICD-10-CM

## 2014-10-26 DIAGNOSIS — J029 Acute pharyngitis, unspecified: Secondary | ICD-10-CM | POA: Diagnosis not present

## 2014-10-26 DIAGNOSIS — R1011 Right upper quadrant pain: Secondary | ICD-10-CM | POA: Diagnosis present

## 2014-10-26 DIAGNOSIS — D72829 Elevated white blood cell count, unspecified: Secondary | ICD-10-CM | POA: Diagnosis not present

## 2014-10-26 DIAGNOSIS — M25511 Pain in right shoulder: Secondary | ICD-10-CM

## 2014-10-26 DIAGNOSIS — K469 Unspecified abdominal hernia without obstruction or gangrene: Secondary | ICD-10-CM | POA: Diagnosis not present

## 2014-10-26 LAB — POCT CBC
Granulocyte percent: 23.6 %G — AB (ref 37–80)
HCT, POC: 41.8 % — AB (ref 43.5–53.7)
Hemoglobin: 12.7 g/dL — AB (ref 14.1–18.1)
LYMPH, POC: 18.6 — AB (ref 0.6–3.4)
MCH: 26.4 pg — AB (ref 27–31.2)
MCHC: 30.4 g/dL — AB (ref 31.8–35.4)
MCV: 86.6 fL (ref 80–97)
MID (CBC): 0.8 (ref 0–0.9)
MPV: 7.7 fL (ref 0–99.8)
PLATELET COUNT, POC: 187 10*3/uL (ref 142–424)
POC Granulocyte: 6 (ref 2–6.9)
POC LYMPH PERCENT: 73.1 %L — AB (ref 10–50)
POC MID %: 3.3 % (ref 0–12)
RBC: 4.83 M/uL (ref 4.69–6.13)
RDW, POC: 17.1 %
WBC: 25.5 10*3/uL — AB (ref 4.6–10.2)

## 2014-10-26 NOTE — Progress Notes (Addendum)
Subjective:  This chart was scribed for [name] MD, by Tamsen Roers, at [facility].  This patient was seen in room 12 and the patient's care was started at 12:43 PM.    Patient ID: Henry [place], male    DOB: Oct 03, 1960, 54 y.o.   MRN: [id number]  HPI  HPI Comments: Henry [place] is a 54 y.o. male who presents to the [facility] for a hospital follow up, see most recent visit with me in April.   Leukocytosis:  He was noted to have  Persistent elevated WBC count and higher than in the past.  A pathology smear was sent that April 28th indicated absolute leukocytosis and atypical leukocytes. He was referred to hemo-onc but had not had success in scheduling this. See lab notes encouraging follow up with hemo-onc as planned.    Hospital Follow-up:  He was admitted in [place] three days ago with a 7 mm kidney stone and discharged four days ago. He had surgery and a stent placed (three days ago) and there were multiple other stones in his kidneys.  Per telephone on June 25 his WBC in the hospital was 22.  ---- Patient believes that his stones are still present  and was told that he had an infection going up from his ureter to his kidneys.  There was a complete blockage which is the reason his stent was put in.  Patient is able to feel the drainage when he urinates.  Patient was given Septra 400-80 mg and Flomax .4 mg. They told him to follow up with his PCP and a urologist.  They told him that the stent must be removed within three months.  Patient states that he has penile pain and has had pain going up to his shoulder blade the past 12 hours.  He describes the pain as someone "punching" him.     Sore throat : unchanged sore throat with pain during swallowing onset four days after the anesthesia before his surgery.    Patient Active Problem List   Diagnosis Date Noted  . Leukocytosis (leucocytosis) 08/29/2014  . HNP (herniated  nucleus pulposus), cervical 01/21/2013  . bilat hip replacement 04/08/2012  . Morbid obesity 04/08/2012  . Edema 04/08/2012   Past Medical History  Diagnosis Date  . Family history of anesthesia complication     " father having delirium after anesthesia"  . Arthritis   . Numbness and tingling     Hx: of in right arm  . Heart murmur     Hx; of as a child  . Joint pain     Hx: of periodically  . Sleep apnea   . Pneumonia   . Kidney stones     Hx; of as a child  . Anemia    Past Surgical History  Procedure Laterality Date  . Lumbar laminectomy      Hx: of 2005  . Joint replacement    . Total hip arthroplasty      Hx: of B/L hips  . Tenosynovitis      Hx: of thumb  . Tonsillectomy    . Colonoscopy      Hx: of  . Posterior cervical laminectomy with met- rx Right 01/21/2013    Procedure: Right Sitting C6-7 Microdiskectomy with Met-rex;  Surgeon: Kristeen Miss, [place];  Location: [facility];  Service: Neurosurgery;  Laterality: Right;  Right Sitting C6-7 Microdiskectomy with Met-rex  . Spine surgery    .  Knee surgery Left 07/21/2014    Dr. Noemi Chapel   No Known Allergies Prior to Admission medications   Medication Sig Start Date End Date Taking? Authorizing Provider  aspirin 81 MG tablet Take 81 mg by mouth daily.   Yes Historical Provider, MD  docusate sodium (COLACE) 100 MG capsule Take 100 mg by mouth 2 (two) times daily.    Yes Historical Provider, MD  ferrous sulfate (IRON SUPPLEMENT) 325 (65 FE) MG tablet Take 325 mg by mouth daily with breakfast.   Yes Historical Provider, MD  furosemide (LASIX) 40 MG tablet Take 1 tablet (40 mg total) by mouth daily. 08/27/14  Yes [name], MD  HYDROcodone-acetaminophen (NORCO) 5-325 MG per tablet Take 1 tablet by mouth every 6 (six) hours as needed for pain. 02/18/13  Yes Thao P Le, DO  ibuprofen (ADVIL,MOTRIN) 200 MG tablet Take 400 mg by mouth every 6 (six) hours as needed for pain.   Yes Historical Provider, MD  Multiple  Vitamins-Minerals (MENS 50+ MULTI VITAMIN/MIN) TABS Take 1 tablet by mouth daily.    Yes Historical Provider, MD  potassium chloride (KLOR-CON 10) 10 MEQ tablet TAKE 1 TABLET (10 MEQ TOTAL) BY MOUTH DAILY. 08/27/14  Yes [name], MD  sulfamethoxazole-trimethoprim (BACTRIM,SEPTRA) 400-80 MG per tablet Take 1 tablet by mouth 2 (two) times daily.   Yes Historical Provider, MD  tamsulosin (FLOMAX) 0.4 MG CAPS capsule Take 0.4 mg by mouth.   Yes Historical Provider, [place]   History   Social History  . Marital Status: Divorced    Spouse Name: N/A  . Number of Children: N/A  . Years of Education: N/A   Occupational History  . 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 Smoking status: Never Smoker   . Smokeless tobacco: Former Systems developer    Types: [place] date: 05/01/1993  . Alcohol Use: 0.6 oz/week    1 Standard drinks or equivalent per week     Comment: BEER AND LIQUOR  . Drug Use: No  . Sexual Activity: No   Other Topics Concern  . Not on file   Social History Narrative        Review of Systems  Constitutional: Negative for fever and chills.  HENT: Positive for sore throat.   Eyes: Negative for pain, discharge and itching.  Gastrointestinal: Positive for abdominal pain. Negative for nausea and vomiting.  Genitourinary: Positive for difficulty urinating.  Musculoskeletal: Positive for myalgias. Negative for neck pain and neck stiffness.       Objective:   Physical Exam  HENT:  Mouth/Throat: Oropharynx is clear and moist. No oropharyngeal exudate.  Moist oral mucosa, no abrasions or bleeding of posterior oropharynx.   Cardiovascular: Normal rate, regular rhythm and normal heart sounds.  Exam reveals no gallop and no friction rub.   No murmur heard. Abdominal: Soft. Bowel sounds are normal. There is no tenderness.   sensation to urinate He experiences upper abdominal pain with sitting up.  Right upper quadrant tenderness.       Musculoskeletal:  slight discomfort right CVA.     Filed Vitals:   10/26/14 1216  BP: 156/94  Pulse: 61  Temp: 98 F (36.7 C)  TempSrc: Oral  Resp: 18  Height: '5\' 8"'  (1.727 m)  Weight: 380 lb (172.367 kg)  SpO2: 97%   Results for orders placed or performed in visit on 10/26/14  POCT CBC  Result Value Ref Range   WBC 25.5 (  A) 4.6 - 10.2 K/uL   Lymph, poc 18.6 (A) 0.6 - 3.4   POC LYMPH PERCENT 73.1 (A) 10 - 50 %L   MID (cbc) 0.8 0 - 0.9   POC MID % 3.3 0 - 12 %M   POC Granulocyte 6.0 2 - 6.9   Granulocyte percent 23.6 (A) 37 - 80 %G   RBC 4.83 4.69 - 6.13 M/uL   Hemoglobin 12.7 (A) 14.1 - 18.1 g/dL   HCT, POC 41.8 (A) 43.5 - 53.7 %   MCV 86.6 80 - 97 fL   MCH, POC 26.4 (A) 27 - 31.2 pg   MCHC 30.4 (A) 31.8 - 35.4 g/dL   RDW, POC 17.1 %   Platelet Count, POC 187 142 - 424 K/uL   MPV 7.7 0 - 99.8 fL   CT abd pelvis: IMPRESSION: 1. Left double-J internal ureteral stent crosses a 6 mm stone at the right UPJ/proximal right ureter. No evidence for a right perinephric fluid collection to suggest abscess on this uninfused exam. No evidence for hydronephrosis. 2. Bilateral nonobstructing renal stones. 3. Left groin hernia contains a short segment of proximal sigmoid colon without obstruction. 4. Probable venous collateralization in the inguinal regions bilaterally, but this is difficult to assess given the streak artifact and lack of intravenous contrast material. Mild bilateral inguinal lymphadenopathy cannot be entirely excluded.      Assessment & Plan:   Henry [place] is a 54 y.o. male Nephrolithiasis,with infection/Acute pyelonephritis  per history from recent hospitalization and stent placement, Right shoulder pain - Plan: Ambulatory referral to Urology, POCT CBC, CT Abdomen Pelvis Wo Contrast  - no apparent perinephric abscess on CT today.  Initial concern of diaphragm irritation with referred R shoulder pain.   -will continue Septra for now, but urine  cx sent to make sure treating underlying infection.   -referral placed to urologist locally for follow up in next few days.  -repeat exam and CBC in next 48 hours.    L inguinal hernia  -asymptomatic.  Can discuss mgt options at follow up .   Leukocytosis - Plan: POCT CBC, CT Abdomen Pelvis Wo Contrast  - thought to be in part with infection found when stent placed for nephrolith, but noted to be in 20's at last OV along with path review indicating absolute leukocytosis and atypical leukocytes.   - we have been trying to get him into Heme onc, and will coordinate referral there this week if he has not heard from them.   Sore throat  -may be due to irritation/abrasion from intubation at time of surgery. If not improving in next few days, consider ENT eval.  RTC/ER sooner if worse.   Meds ordered this encounter  Medications  . sulfamethoxazole-trimethoprim (BACTRIM,SEPTRA) 400-80 MG per tablet    Sig: Take 1 tablet by mouth 2 (two) times daily.  . tamsulosin (FLOMAX) 0.4 MG CAPS capsule    Sig: Take 0.4 mg by mouth.   Patient Instructions  Go to [facility] and register at radiology for [place].   I will receive results of CT scan today, and repeating urine culture. I referred you to urology locally, but may need inpatient treatment based on CT scan.  I will call you later today with update.     I personally performed the services described in this documentation, which was scribed in my presence. The recorded information has been reviewed and considered, and addended by me as needed.

## 2014-10-26 NOTE — Telephone Encounter (Signed)
Pt states he had an episode this weekend and wanted to discuss with Dr. Carlota Raspberry. Please call [phone]

## 2014-10-26 NOTE — Telephone Encounter (Signed)
Left message for pt to call back  °

## 2014-10-26 NOTE — Patient Instructions (Addendum)
Go to [facility] and register at radiology for [place].   I will receive results of CT scan today, and repeating urine culture. I referred you to urology locally, but may need inpatient treatment based on CT scan.  I will call you later today with update.

## 2014-10-26 NOTE — Telephone Encounter (Signed)
Pt called requesting his CT results.  Per Dr. Carlota Raspberry he was given the message that Dr. Carlota Raspberry left.  Pt understood everything.

## 2014-10-27 ENCOUNTER — Ambulatory Visit (INDEPENDENT_AMBULATORY_CARE_PROVIDER_SITE_OTHER): Payer: BC Managed Care – PPO | Admitting: Family Medicine

## 2014-10-27 VITALS — BP 148/92 | HR 104 | Temp 98.8°F | Resp 20 | Ht 68.0 in | Wt 380.0 lb

## 2014-10-27 DIAGNOSIS — K4090 Unilateral inguinal hernia, without obstruction or gangrene, not specified as recurrent: Secondary | ICD-10-CM

## 2014-10-27 DIAGNOSIS — N200 Calculus of kidney: Secondary | ICD-10-CM | POA: Insufficient documentation

## 2014-10-27 DIAGNOSIS — M79604 Pain in right leg: Secondary | ICD-10-CM | POA: Diagnosis not present

## 2014-10-27 DIAGNOSIS — D72829 Elevated white blood cell count, unspecified: Secondary | ICD-10-CM

## 2014-10-27 DIAGNOSIS — M79661 Pain in right lower leg: Secondary | ICD-10-CM

## 2014-10-27 DIAGNOSIS — N201 Calculus of ureter: Secondary | ICD-10-CM | POA: Insufficient documentation

## 2014-10-27 DIAGNOSIS — E669 Obesity, unspecified: Secondary | ICD-10-CM | POA: Diagnosis not present

## 2014-10-27 DIAGNOSIS — M7989 Other specified soft tissue disorders: Secondary | ICD-10-CM | POA: Diagnosis not present

## 2014-10-27 DIAGNOSIS — L03115 Cellulitis of right lower limb: Secondary | ICD-10-CM | POA: Diagnosis not present

## 2014-10-27 LAB — URINE CULTURE
Colony Count: NO GROWTH
Organism ID, Bacteria: NO GROWTH

## 2014-10-27 NOTE — Patient Instructions (Signed)
[facility] - you may need to be admitted through the emergency room depending on their workup.  Follow up after either the ER or if you are admitted and we can determine next steps.

## 2014-10-27 NOTE — Telephone Encounter (Signed)
White count increased 3000 overnight.   Patient will come in tonight if Dr. Carlota Raspberry wants him to come in.   Scheduled for Thursday with urologist.   Needs urgent referral.    [phone]

## 2014-10-27 NOTE — Progress Notes (Addendum)
Subjective:  This chart was scribed for Henry Ray, MD by Henry [place], medical scribe at [facility].The patient was seen in exam room 13 and the patient's care was started at 5:45 PM.   Patient ID: Henry [place], male    DOB: May 10, 1960, 54 y.o.   MRN: [id number] Chief Complaint  Patient presents with  . Follow-up    elevated WBC--follow up with Dr. Noel Christmas by urology----reddness to right leg--painful in 3 areas.    HPI HPI Comments: Henry H[place] is a 54 y.o. male who presents to [facility] here for follow up for multiple concerns. See notes   Nephrolithiasis,with infection and acute pyelonephritis:  Admitted in [place], [place] for 7 mm kidney stone he had a stent placed. Multiple stones noted and an infection present Started on Septra 400-80 mg and Flomax .4 mg. He complained of shoulder pain prior 12 hours and pain radiating towards the penis as well as persistent hematuria. CBC yesterday WBC 25.5 which is up three from two months ago (see below on persistent leukocytosis) he was afebrile but sent for CT scan to rule out abscess. On CT scan the stent was in place in the right ureter no evidence of a right abscess and no evidence of hydronephrosis. They did note bilateral obstructive renal stones and a left groin hernia without obstruction. Also possible inguinal lymphadenopathy vs venous collateralization. Repeat urine culture was drawn and pending. Urgent referral ordered to Dr. Willaim Bane PA Henry [place] for this morning at 10:00 AM. They are with [facility].  Today, Pt states he feels drained, like "someone has been beating on him". He has an appointment scheduled with Dr. Venia Minks. He will do a laser, remove the stent and place a stent with a string in place to allow the infection to flow out. X-[place] in his office showed multiple kidney stones. Dr. Venia Minks was concerned about elevated lymphocytes. He is trying to get  the patient into hematology this week. Dr. Venia Minks changed him from septra to Levaquin. Three days from now he will have a follow up for repeat CBC to see if the infection has gone down. He has been on antibiotics for five days total, and Septra for three days. History of Anemia in 2009, a blood smear was done because he was going through phlebitis.   Leukocytosis: See prior visits and telephone notes. Pathology smear on April 28 th indicated absolute leukocytosis with atypical leukocytes. Referred to hematology but there has been difficulty with scheduling up to this time. On review of his past few CBC he was borderline anemic two months ago and anemic yesterday with hemoglobin of 12.7. 05/15/2014 WBC was 12.8 07/2014 WBC was 22.5. No fever since yesterday, heart rate increased due to anxiety.   Results for orders placed or performed in visit on 10/26/14  POCT CBC  Result Value Ref Range   WBC 25.5 (A) 4.6 - 10.2 K/uL   Lymph, poc 18.6 (A) 0.6 - 3.4   POC LYMPH PERCENT 73.1 (A) 10 - 50 %L   MID (cbc) 0.8 0 - 0.9   POC MID % 3.3 0 - 12 %M   POC Granulocyte 6.0 2 - 6.9   Granulocyte percent 23.6 (A) 37 - 80 %G   RBC 4.83 4.69 - 6.13 M/uL   Hemoglobin 12.7 (A) 14.1 - 18.1 g/dL   HCT, POC 41.8 (A) 43.5 - 53.7 %   MCV 86.6 80 - 97 fL  MCH, POC 26.4 (A) 27 - 31.2 pg   MCHC 30.4 (A) 31.8 - 35.4 g/dL   RDW, POC 17.1 %   Platelet Count, POC 187 142 - 424 K/uL   MPV 7.7 0 - 99.8 fL   Additionally he complains of redness in his right leg today with pain in three areas: He first noticed some soreness in his lower right leg yesterday afternoon. Over night the redness has traveled up the right leg. He currently has pain in his upper right thigh, and back of his right calf, with associated tightness in his calf. He does have a history cellulitis. No history of DVT but superficial phlebitis in 2004 after back surgery. Recent surgery and [place] visit left him immobile for and extended period. He has  taken pain medicine for pain once every fours as needed. No leg elevation, heat or ice. This is a recurrent issue, 5 th time this has happened. He denies chest pain, shortness of breath, fever, and chills.  Left inguinal hernia:  He is not experiencing pain, see prior CT scan.  Patient Active Problem List   Diagnosis Date Noted  . Leukocytosis (leucocytosis) 08/29/2014  . HNP (herniated nucleus pulposus), cervical 01/21/2013  . bilat hip replacement 04/08/2012  . Morbid obesity 04/08/2012  . Edema 04/08/2012   Past Medical History  Diagnosis Date  . Family history of anesthesia complication     " father having delirium after anesthesia"  . Arthritis   . Numbness and tingling     Hx: of in right arm  . Heart murmur     Hx; of as a child  . Joint pain     Hx: of periodically  . Sleep apnea   . Pneumonia   . Kidney stones     Hx; of as a child  . Anemia    Past Surgical History  Procedure Laterality Date  . Lumbar laminectomy      Hx: of 2005  . Joint replacement    . Total hip arthroplasty      Hx: of B/L hips  . Tenosynovitis      Hx: of thumb  . Tonsillectomy    . Colonoscopy      Hx: of  . Posterior cervical laminectomy with met- rx Right 01/21/2013    Procedure: Right Sitting C6-7 Microdiskectomy with Met-rex;  Surgeon: Kristeen Miss, [place];  Location: [facility];  Service: Neurosurgery;  Laterality: Right;  Right Sitting C6-7 Microdiskectomy with Met-rex  . Spine surgery    . Knee surgery Left 07/21/2014    Dr. Noemi Chapel   No Known Allergies Prior to Admission medications   Medication Sig Start Date End Date Taking? Authorizing Provider  aspirin 81 MG tablet Take 81 mg by mouth daily.   Yes Historical Provider, [place]  docusate sodium (COLACE) 100 MG capsule Take 100 mg by mouth 2 (two) times daily.    Yes Historical Provider, [place]  ferrous sulfate (IRON SUPPLEMENT) 325 (65 FE) MG tablet Take 325 mg by mouth daily with breakfast.   Yes Historical Provider, [place]  furosemide  (LASIX) 40 MG tablet Take 1 tablet (40 mg total) by mouth daily. 08/27/14  Yes [name], MD  HYDROcodone-acetaminophen (NORCO) 5-325 MG per tablet Take 1 tablet by mouth every 6 (six) hours as needed for pain. 02/18/13  Yes Thao P Le, DO  ibuprofen (ADVIL,MOTRIN) 200 MG tablet Take 400 mg by mouth every 6 (six) hours as needed for pain.   Yes Historical  Provider, [place]  Multiple Vitamins-Minerals (MENS 50+ MULTI VITAMIN/MIN) TABS Take 1 tablet by mouth daily.    Yes Historical Provider, MD  potassium chloride (KLOR-CON 10) 10 MEQ tablet TAKE 1 TABLET (10 MEQ TOTAL) BY MOUTH DAILY. 08/27/14  Yes [name], MD  tamsulosin (FLOMAX) 0.4 MG CAPS capsule Take 0.4 mg by mouth.   Yes Historical Provider, MD  sulfamethoxazole-trimethoprim (BACTRIM,SEPTRA) 400-80 MG per tablet Take 1 tablet by mouth 2 (two) times daily.    Historical Provider, MD   History   Social History  . Marital Status: Divorced    Spouse Name: N/A  . Number of Children: N/A  . Years of Education: N/A   Occupational History  . 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 Smokeless tobacco: Former Systems developer    Types: [place] date: 05/01/1993  . Alcohol Use: 0.6 oz/week    1 Standard drinks or equivalent per week     Comment: BEER AND LIQUOR  . Drug Use: No  . Sexual Activity: No   Other Topics Concern  . Not on file   Social History Narrative   Review of Systems  Constitutional: Negative for fever and chills.  Respiratory: Negative for shortness of breath.   Cardiovascular: Positive for leg swelling. Negative for chest pain.  Genitourinary: Positive for penile pain.      Objective:  BP 148/92 mmHg  Pulse 104  Temp(Src) 98.8 F (37.1 C) (Oral)  Resp 20  Ht '5\' 8"'  (1.727 m)  Wt 380 lb (172.367 kg)  BMI 57.79 kg/m2  SpO2 98% Physical Exam  Constitutional: He is oriented to person, place, and time. He appears well-developed and  well-nourished. No distress.  HENT:  Head: Normocephalic and atraumatic.  Eyes: Pupils are equal, round, and reactive to light.  Neck: Normal range of motion.  Cardiovascular: Normal rate, regular rhythm and normal heart sounds.  Exam reveals no gallop and no friction rub.   No murmur heard. Pulmonary/Chest: Effort normal and breath sounds normal. No respiratory distress. He has no wheezes.  Breath sounds are normal but distant.  Abdominal: Soft. He exhibits no distension. There is no tenderness.  Musculoskeletal: Normal range of motion.  Edema bilateral lower extremity, but more predominant with tightness in the right lower extremity. Induration with warmth anterior and medial. Erythema along the saphenous vein. Tender nodular hyperpigmented area just below the medical surface of the knee. Erythema extends up approximately 10 cm proximal the patella on medial leg. Positive Homan's as well as pain on the distal calf to palpation.   Neurological: He is alert and oriented to person, place, and time.  Skin: Skin is warm and dry.  Psychiatric: He has a normal mood and affect. His behavior is normal.  Nursing note and vitals reviewed.     Assessment & Plan:   Rafferty Postlewait is a 54 y.o. male Right nephrolithiasis, with pyelonephritis.   - [place], initially on IV antibiotics 5 days ago, now on day #4 of Septra. Evaluated by urology in [facility] this morning.  WBC had increased from 25 yesterday to 28 this morning. Planned to change from Septra to Levaquin., but has not yet started this.   -no apparent abscess on CT yesterday.  Leukocytosis  - with anemia, concerning for lymphoproliferative process such as CLL, but also in setting of infection may be contributing  to elevation.  Increased WBC overnight concerning for worsening infection - possibly cellulitis of leg contributing to this.   Right leg pain, Pain and swelling of lower leg, right,  Cellulitis of leg, right  -Hx of LE edema with underlying obesity, now with acute onset R medial lower leg pain, redness, pain and swelling that has quickly moved up distribution of saphenous. Concerning for saphenous extension of cellulitis, vs. superficial thrombophlebitis, but with recent surgery, immobilization in [place] - ddx includes DVT.   -will have evaluated in ER at [facility] (ED provider advised), as possible admission, then can be followed by urologist if needed.   Left inguinal hernia  - asymptomatic. Will need eval by surgeon, but can follow up after above acute issues first.  -ER precautions discussed if symptomatic.   Meds ordered this encounter            Patient Instructions  [facility] ER - you may need to be admitted through the emergency room depending on their workup.  Follow up after either the ER or if you are admitted and we can determine next steps.        I personally performed the services described in this documentation, which was scribed in my presence. The recorded information has been reviewed and considered, and addended by me as needed.

## 2014-10-27 NOTE — Telephone Encounter (Signed)
Left message for pt to call back  °

## 2014-10-28 ENCOUNTER — Emergency Department (HOSPITAL_BASED_OUTPATIENT_CLINIC_OR_DEPARTMENT_OTHER)
Admit: 2014-10-28 | Discharge: 2014-10-28 | Disposition: A | Discharge status: Home / Self Care | Payer: BC Managed Care – PPO | Attending: Emergency Medicine | Admitting: Emergency Medicine

## 2014-10-28 ENCOUNTER — Emergency Department (HOSPITAL_COMMUNITY)
Admission: EM | Admit: 2014-10-28 | Discharge: 2014-10-28 | Disposition: A | Discharge status: Home / Self Care | Payer: BC Managed Care – PPO | Attending: Emergency Medicine | Admitting: Emergency Medicine

## 2014-10-28 ENCOUNTER — Encounter (HOSPITAL_COMMUNITY): Payer: Self-pay

## 2014-10-28 ENCOUNTER — Telehealth: Payer: Self-pay

## 2014-10-28 DIAGNOSIS — M1990 Unspecified osteoarthritis, unspecified site: Secondary | ICD-10-CM | POA: Insufficient documentation

## 2014-10-28 DIAGNOSIS — R011 Cardiac murmur, unspecified: Secondary | ICD-10-CM | POA: Insufficient documentation

## 2014-10-28 DIAGNOSIS — R5383 Other fatigue: Secondary | ICD-10-CM | POA: Insufficient documentation

## 2014-10-28 DIAGNOSIS — Z8669 Personal history of other diseases of the nervous system and sense organs: Secondary | ICD-10-CM | POA: Insufficient documentation

## 2014-10-28 DIAGNOSIS — Z7982 Long term (current) use of aspirin: Secondary | ICD-10-CM | POA: Diagnosis not present

## 2014-10-28 DIAGNOSIS — Z79899 Other long term (current) drug therapy: Secondary | ICD-10-CM | POA: Insufficient documentation

## 2014-10-28 DIAGNOSIS — Z87442 Personal history of urinary calculi: Secondary | ICD-10-CM | POA: Diagnosis not present

## 2014-10-28 DIAGNOSIS — M79604 Pain in right leg: Secondary | ICD-10-CM

## 2014-10-28 DIAGNOSIS — R319 Hematuria, unspecified: Secondary | ICD-10-CM | POA: Insufficient documentation

## 2014-10-28 DIAGNOSIS — R300 Dysuria: Secondary | ICD-10-CM | POA: Diagnosis not present

## 2014-10-28 DIAGNOSIS — Z8701 Personal history of pneumonia (recurrent): Secondary | ICD-10-CM | POA: Diagnosis not present

## 2014-10-28 DIAGNOSIS — I824Z1 Acute embolism and thrombosis of unspecified deep veins of right distal lower extremity: Secondary | ICD-10-CM | POA: Diagnosis not present

## 2014-10-28 DIAGNOSIS — D649 Anemia, unspecified: Secondary | ICD-10-CM | POA: Diagnosis not present

## 2014-10-28 DIAGNOSIS — I82401 Acute embolism and thrombosis of unspecified deep veins of right lower extremity: Secondary | ICD-10-CM

## 2014-10-28 LAB — CBC WITH DIFFERENTIAL/PLATELET
BASOS ABS: 0 10*3/uL (ref 0.0–0.1)
Basophils Relative: 0 % (ref 0–1)
Eosinophils Absolute: 0.2 10*3/uL (ref 0.0–0.7)
Eosinophils Relative: 1 % (ref 0–5)
HCT: 39 % (ref 39.0–52.0)
Hemoglobin: 12.4 g/dL — ABNORMAL LOW (ref 13.0–17.0)
LYMPHS ABS: 15.7 10*3/uL — AB (ref 0.7–4.0)
LYMPHS PCT: 69 % — AB (ref 12–46)
MCH: 28.1 pg (ref 26.0–34.0)
MCHC: 31.8 g/dL (ref 30.0–36.0)
MCV: 88.2 fL (ref 78.0–100.0)
MONOS PCT: 3 % (ref 3–12)
Monocytes Absolute: 0.7 10*3/uL (ref 0.1–1.0)
Neutro Abs: 6.2 10*3/uL (ref 1.7–7.7)
Neutrophils Relative %: 27 % — ABNORMAL LOW (ref 43–77)
PLATELETS: 162 10*3/uL (ref 150–400)
RBC: 4.42 MIL/uL (ref 4.22–5.81)
RDW: 14.7 % (ref 11.5–15.5)
WBC: 22.8 10*3/uL — AB (ref 4.0–10.5)

## 2014-10-28 LAB — COMPREHENSIVE METABOLIC PANEL
ALK PHOS: 102 U/L (ref 38–126)
ALT: 42 U/L (ref 17–63)
ANION GAP: 8 (ref 5–15)
AST: 22 U/L (ref 15–41)
Albumin: 3.4 g/dL — ABNORMAL LOW (ref 3.5–5.0)
BILIRUBIN TOTAL: 0.7 mg/dL (ref 0.3–1.2)
BUN: 17 mg/dL (ref 6–20)
CHLORIDE: 105 mmol/L (ref 101–111)
CO2: 27 mmol/L (ref 22–32)
Calcium: 8.4 mg/dL — ABNORMAL LOW (ref 8.9–10.3)
Creatinine, Ser: 0.9 mg/dL (ref 0.61–1.24)
GFR calc non Af Amer: >60 mL/min (ref >60)
GLUCOSE: 139 mg/dL — AB (ref 65–99)
Potassium: 4.7 mmol/L (ref 3.5–5.1)
Sodium: 140 mmol/L (ref 135–145)
TOTAL PROTEIN: 6.8 g/dL (ref 6.5–8.1)

## 2014-10-28 LAB — URINALYSIS, ROUTINE W REFLEX MICROSCOPIC
BILIRUBIN URINE: NEGATIVE
GLUCOSE, UA: NEGATIVE mg/dL
KETONES UR: NEGATIVE mg/dL
NITRITE: NEGATIVE
PH: 6 (ref 5.0–8.0)
Protein, ur: 100 mg/dL — AB
Specific Gravity, Urine: 1.023 (ref 1.005–1.030)
Urobilinogen, UA: 0.2 mg/dL (ref 0.0–1.0)

## 2014-10-28 LAB — URINE MICROSCOPIC-ADD ON

## 2014-10-28 LAB — PATHOLOGIST SMEAR REVIEW

## 2014-10-28 MED ORDER — RIVAROXABAN 15 MG PO TABS
15.0000 mg | ORAL_TABLET | Freq: Once | ORAL | Status: AC
  Administered 2014-10-28: 15 mg via ORAL
  Filled 2014-10-28: qty 1

## 2014-10-28 MED ORDER — XARELTO VTE STARTER PACK 15 & 20 MG PO TBPK: 15.0000 mg | ORAL_TABLET | ORAL | Status: DC

## 2014-10-28 MED ORDER — RIVAROXABAN (XARELTO) EDUCATION KIT FOR DVT/PE PATIENTS
PACK | Freq: Once | Status: AC
  Administered 2014-10-28: 12:00:00
  Filled 2014-10-28: qty 1

## 2014-10-28 MED ORDER — OXYCODONE-ACETAMINOPHEN 5-325 MG PO TABS
1.0000 | ORAL_TABLET | Freq: Once | ORAL | Status: AC
  Administered 2014-10-28: 1 via ORAL
  Filled 2014-10-28: qty 1

## 2014-10-28 NOTE — ED Notes (Signed)
Pt c/o RLE pain, redness, swelling x 3days.  Pt states he was seen by PMD and had elevated WBC 2days ago.  Pt was sent to another ER last night for IV abx but left prior to receiving them. Pt states redness is now streaking up to right knee.

## 2014-10-28 NOTE — Progress Notes (Signed)
*  Preliminary Results* Right lower extremity venous duplex completed. Right lower extremity is positive for superficial and deep vein thrombosis involving the right greater saphenous vein and right gastrocnemius veins. There is no evidence of right Baker's cyst.  Preliminary results discussed with Dr. Aline Brochure.  10/28/2014 9:46 AM  Maudry Mayhew, RVT, RDCS, RDMS

## 2014-10-28 NOTE — ED Provider Notes (Signed)
CSN: [id number]     Arrival date & time 10/28/14  0705 History   First MD Initiated Contact with Patient 10/28/14 0720     Chief Complaint  Patient presents with  . Leg Pain    RLE pain, swelling, redness x several days     (Consider location/radiation/quality/duration/timing/severity/associated sxs/prior Treatment) Patient is a 54 y.o. male presenting with leg pain. The history is provided by the patient.  Leg Pain Location:  Leg Time since incident:  6 days Injury: no   Leg location:  R leg Pain details:    Quality:  Aching   Radiates to:  Does not radiate   Severity:  Moderate   Onset quality:  Gradual   Duration:  6 days   Timing:  Constant   Progression:  Worsening Chronicity:  Recurrent Dislocation: no   Foreign body present:  No foreign bodies Prior injury to area:  No Relieved by:  Nothing Worsened by:  Nothing tried Ineffective treatments:  None tried Associated symptoms: fatigue   Associated symptoms: no fever and no neck pain     Past Medical History  Diagnosis Date  . Family history of anesthesia complication     " father having delirium after anesthesia"  . Arthritis   . Numbness and tingling     Hx: of in right arm  . Heart murmur     Hx; of as a child  . Joint pain     Hx: of periodically  . Sleep apnea   . Pneumonia   . Kidney stones     Hx; of as a child  . Anemia    Past Surgical History  Procedure Laterality Date  . Lumbar laminectomy      Hx: of 2005  . Joint replacement    . Total hip arthroplasty      Hx: of B/L hips  . Tenosynovitis      Hx: of thumb  . Tonsillectomy    . Colonoscopy      Hx: of  . Posterior cervical laminectomy with met- rx Right 01/21/2013    Procedure: Right Sitting C6-7 Microdiskectomy with Met-rex;  Surgeon: Kristeen Miss, MD;  Location: [facility];  Service: Neurosurgery;  Laterality: Right;  Right Sitting C6-7 Microdiskectomy with Met-rex  . Spine surgery    . Knee surgery Left 07/21/2014    Dr. Noemi Chapel    Family History  Problem Relation Age of Onset  . COPD Maternal Grandmother   . Heart disease Maternal Grandfather   . Cancer Paternal Grandfather   . Cancer Father   . Diabetes Father    History  Substance Use Topics  . Smoking status: Never Smoker   . Smokeless tobacco: Former Systems developer    Types: [place] date: 05/01/1993  . Alcohol Use: 0.6 oz/week    1 Standard drinks or equivalent per week     Comment: BEER AND LIQUOR    Review of Systems  Constitutional: Positive for fatigue. Negative for fever.  HENT: Negative for drooling and rhinorrhea.   Eyes: Negative for pain.  Respiratory: Negative for cough and shortness of breath.   Cardiovascular: Negative for chest pain and leg swelling.  Gastrointestinal: Negative for nausea, vomiting, abdominal pain and diarrhea.  Genitourinary: Positive for dysuria and hematuria.  Musculoskeletal: Negative for gait problem and neck pain.  Skin: Negative for color change.  Neurological: Negative for numbness and headaches.  Hematological: Negative for adenopathy.  Psychiatric/Behavioral: Negative for behavioral problems.  All other  systems reviewed and are negative.     Allergies  Review of patient's allergies indicates no known allergies.  Home Medications   Prior to Admission medications   Medication Sig Start Date End Date Taking? Authorizing Provider  aspirin 81 MG tablet Take 81 mg by mouth daily.   Yes Historical Provider, MD  docusate sodium (COLACE) 100 MG capsule Take 100 mg by mouth 2 (two) times daily.    Yes Historical Provider, MD  ferrous sulfate (IRON SUPPLEMENT) 325 (65 FE) MG tablet Take 325 mg by mouth daily with breakfast.   Yes Historical Provider, MD  furosemide (LASIX) 40 MG tablet Take 1 tablet (40 mg total) by mouth daily. 08/27/14  Yes [name], MD  ibuprofen (ADVIL,MOTRIN) 200 MG tablet Take 800-1,000 mg by mouth every 6 (six) hours as needed for moderate pain.    Yes Historical Provider, MD    levofloxacin (LEVAQUIN) 500 MG tablet Take 500 mg by mouth daily. For 5 days 10/27/14 11/01/14 Yes Historical Provider, MD  Multiple Vitamins-Minerals (MENS 50+ MULTI VITAMIN/MIN) TABS Take 1 tablet by mouth daily.    Yes Historical Provider, MD  oxyCODONE-acetaminophen (PERCOCET/ROXICET) 5-325 MG per tablet Take 1 tablet by mouth every 4 (four) hours as needed for severe pain.   Yes Historical Provider, MD  potassium chloride (KLOR-CON 10) 10 MEQ tablet TAKE 1 TABLET (10 MEQ TOTAL) BY MOUTH DAILY. 08/27/14  Yes [name], MD  tamsulosin (FLOMAX) 0.4 MG CAPS capsule Take 0.4 mg by mouth daily.    Yes Historical Provider, MD   BP 134/66 mmHg  Pulse 85  Temp(Src) 99.4 F (37.4 C) (Oral)  Resp 20  Ht '5\' 9"'  (1.753 m)  Wt 374 lb (169.645 kg)  BMI 55.20 kg/m2  SpO2 94% Physical Exam  Constitutional: He is oriented to person, place, and time. He appears well-developed and well-nourished.  HENT:  Head: Normocephalic and atraumatic.  Right Ear: External ear normal.  Left Ear: External ear normal.  Nose: Nose normal.  Mouth/Throat: Oropharynx is clear and moist. No oropharyngeal exudate.  Eyes: Conjunctivae and EOM are normal. Pupils are equal, round, and reactive to light.  Neck: Normal range of motion. Neck supple.  Cardiovascular: Normal rate, regular rhythm, normal heart sounds and intact distal pulses.  Exam reveals no gallop and no friction rub.   No murmur heard. Pulmonary/Chest: Effort normal and breath sounds normal. No respiratory distress. He has no wheezes.  Abdominal: Soft. Bowel sounds are normal. He exhibits no distension. There is no tenderness. There is no rebound and no guarding.  Musculoskeletal: Normal range of motion. He exhibits no edema or tenderness.  Faint erythematous streaking noted up the anterior right lower extremity. Possibly some mild palpable cords are felt which are tender to palpation.  Right lower extremity appears slightly larger than the left.  2+  distal DP pulse on the right lower extremity.  Neurological: He is alert and oriented to person, place, and time.  Skin: Skin is warm and dry.  Psychiatric: He has a normal mood and affect. His behavior is normal.  Nursing note and vitals reviewed.   ED Course  Procedures (including critical care time) Labs Review Labs Reviewed  CBC WITH DIFFERENTIAL/PLATELET  COMPREHENSIVE METABOLIC PANEL  URINALYSIS, ROUTINE W REFLEX MICROSCOPIC (NOT AT [facility])    Imaging Review Ct Abdomen Pelvis Wo Contrast  10/26/2014   CLINICAL DATA:  Initial encounter for radiating right shoulder and right upper quadrant pain. Patient had a right renal stent placed  3 days ago in [place].  EXAM: CT ABDOMEN AND PELVIS WITHOUT CONTRAST  TECHNIQUE: Multidetector CT imaging of the abdomen and pelvis was performed following the standard protocol without IV contrast.  COMPARISON:  None.  FINDINGS: Lower chest:  Unremarkable.  Hepatobiliary: No focal abnormality in the liver on this study without intravenous contrast. No evidence for hepatomegaly. There is no evidence for gallstones, gallbladder wall thickening, or pericholecystic fluid. No intrahepatic or extrahepatic biliary dilation.  Pancreas: No focal mass lesion. No dilatation of the main duct. No intraparenchymal cyst. No peripancreatic edema.  Spleen: No splenomegaly. No focal mass lesion.  Adrenals/Urinary Tract: No adrenal nodule or mass. 1 mm stone is seen in the upper pole of the right kidney. 2 mm stone is seen in the interpolar right kidney. Proximal loop of a double-J internal ureteral stent is formed in the right renal pelvis and crosses a 5-6 mm stone identified at the right UPJ. The distal loop of the right ureteral stent is formed in the posterior bladder lumen.  1 mm nonobstructing stone is seen in the interpolar left kidney. No evidence for left ureteral stones.  Bladder is not well evaluated secondary to the substantial streak artifact emanating from the  bilateral hip replacements.  Stomach/Bowel: Stomach is nondistended. No gastric wall thickening. No evidence of outlet obstruction. Duodenum is normally positioned as is the ligament of Treitz. No small bowel wall thickening. No small bowel dilatation. Terminal ileum is normal. The appendix is normal. Date segment of the proximal sigmoid colon is contained within a left lower quadrant abdominal wall hernia.  Vascular/Lymphatic: There is abdominal aortic atherosclerosis without aneurysm. No lymphadenopathy in the abdomen. No pelvic sidewall lymphadenopathy is evident although streak artifact hinders assessment. Soft tissue attenuating structures are seen in both inguinal regions which may be related to venous anatomy, but mild inguinal lymphadenopathy cannot be excluded.  Reproductive: Obscured by beam hardening artifact.  Other: No gross intraperitoneal free fluid. Small umbilical hernia noted. Small right inguinal hernia contains fat in a trace amount of fluid. The large left groin hernia contains a loop of sigmoid colon.  Musculoskeletal: Status post bilateral hip replacement. Bone windows reveal no worrisome lytic or sclerotic osseous lesions.  IMPRESSION: 1. Left double-J internal ureteral stent crosses a 6 mm stone at the right UPJ/proximal right ureter. No evidence for a right perinephric fluid collection to suggest abscess on this uninfused exam. No evidence for hydronephrosis. 2. Bilateral nonobstructing renal stones. 3. Left groin hernia contains a short segment of proximal sigmoid colon without obstruction. 4. Probable venous collateralization in the inguinal regions bilaterally, but this is difficult to assess given the streak artifact and lack of intravenous contrast material. Mild bilateral inguinal lymphadenopathy cannot be entirely excluded.   Electronically Signed   By: Misty Stanley M.D.   On: 10/26/2014 15:19     EKG Interpretation None        MDM   Final diagnoses:  Right leg pain    DVT (deep venous thrombosis), right    8:16 AM 54 y.o. male w hx of kidney stones s/p recent stent placement, phlebitis/cellulitis of RLE who presents with right leg redness and swelling. He notes that he has had about 4 previous episodes of this in the past. He has no history of DVT and states that in the past has been diagnosed as cellulitis versus phlebitis. He denies any fevers, vomiting, or diarrhea. He has felt fatigued. He is afebrile and vital signs are unremarkable here.  Of note,  he was in [place] last Friday for a conference and developed a kidney stone. He was seen at a hospital in [place] and had a ureteral stent placed. He has followed up with urology at [facility] and has plans for laser treatment of the stone next week. He has been on Bactrim since last Friday (5 days). He was placed on Levaquin by his urologist yesterday. He notes progression of the redness and swelling in the right lower extremity despite taking the Abx.  Plan for [place] to r/o DVT, labs, pain control. Pt well appearing, in no acute distress. Will workup prior to abx administration.   11:11 AM Pt found to have DVT. I tried to call the patient's PCP, Dr. Nyoka Cowden, but he was unavailable. I discussed the case with the patient/wife. Given the fact that he is afebrile, decreasing white blood cell count, and otherwise well-appearing on exam, I think it would be reasonable to treat the patient for the DVT as an outpatient on xarelto.  Family ok with this. Unlikely concurrent cellulitis, I think the redness/swelling is d/t the superficial and dvt. Pt informed to watch out for worsening hematuria. Pt denies sob/cp. VSS, no tachycardia, doubt PE.   11:13 AM:  I have discussed the diagnosis/risks/treatment options with the patient and family and believe the pt to be eligible for discharge home to follow-up with his pcp in 1-2 days. We also discussed returning to the ED immediately if new or worsening sx occur. We discussed the sx  which are most concerning (e.g., fever, spreading redness, worsening pain, sx of PE discussed w/ pt - sob/cp) that necessitate immediate return. Medications administered to the patient during their visit and any new prescriptions provided to the patient are listed below.  Medications given during this visit Medications  Rivaroxaban (XARELTO) tablet 15 mg (not administered)  rivaroxaban (XARELTO) Education Kit for DVT/PE patients (not administered)  oxyCODONE-acetaminophen (PERCOCET/ROXICET) 5-325 MG per tablet 1 tablet (1 tablet Oral Given 10/28/14 0842)    New Prescriptions   XARELTO STARTER PACK 15 & 20 MG TBPK    Take 15-20 mg by mouth as directed. Take as directed on package: Start with one 60m tablet by mouth twice a day with food. On Day 22, switch to one 236mtablet once a day with food.     FoPamella PertMD 10/28/14 1114

## 2014-10-28 NOTE — Progress Notes (Signed)
Pharmacy Education: Xarelto for DVT  Unable to document education for Xarelto, spoke with patient and mother regarding dosing, adverse effects, interacting meds, etc  Delivered patient discharge kit with first dose Xarelto to ED  Patient understands ramifications of Xarelto use  Minda Ditto PharmD Pager [phone] 10/28/2014, 11:18 AM

## 2014-10-28 NOTE — Telephone Encounter (Signed)
Pt wants Dr. Carlota Raspberry to know he was seen in the ER earlier today. He has a stent in his ureter. He is peeing blood. He just wants Dr. Carlota Raspberry to give him a CB. Please advise at  [phone]

## 2014-10-28 NOTE — Discharge Instructions (Signed)
Information on my medicine - XARELTO (rivaroxaban)  This medication education was reviewed with me or my healthcare representative as part of my discharge preparation.  The pharmacist that spoke with me during my hospital stay was:  Minda Ditto, Alicia? Xarelto was prescribed to treat blood clots that may have been found in the veins of your legs (deep vein thrombosis) or in your lungs (pulmonary embolism) and to reduce the risk of them occurring again.  What do you need to know about Xarelto? The starting dose is one 15 mg tablet taken TWICE daily with food for the FIRST 21 DAYS then on (enter date) 7/20  the dose is changed to one 20 mg tablet taken ONCE A DAY with your evening meal.  DO NOT stop taking Xarelto without talking to the health care provider who prescribed the medication.  Refill your prescription for 20 mg tablets before you run out.  After discharge, you should have regular check-up appointments with your healthcare provider that is prescribing your Xarelto.  In the future your dose may need to be changed if your kidney function changes by a significant amount.  What do you do if you miss a dose? If you are taking Xarelto TWICE DAILY and you miss a dose, take it as soon as you remember. You may take two 15 mg tablets (total 30 mg) at the same time then resume your regularly scheduled 15 mg twice daily the next day.  If you are taking Xarelto ONCE DAILY and you miss a dose, take it as soon as you remember on the same day then continue your regularly scheduled once daily regimen the next day. Do not take two doses of Xarelto at the same time.   Important Safety Information Xarelto is a blood thinner medicine that can cause bleeding. You should call your healthcare provider right away if you experience any of the following: ? Bleeding from an injury or your nose that does not stop. ? Unusual colored urine (red or dark brown) or unusual  colored stools (red or black). ? Unusual bruising for unknown reasons. ? A serious fall or if you hit your head (even if there is no bleeding).  Some medicines may interact with Xarelto and might increase your risk of bleeding while on Xarelto. To help avoid this, consult your healthcare provider or pharmacist prior to using any new prescription or non-prescription medications, including herbals, vitamins, non-steroidal anti-inflammatory drugs (NSAIDs) and supplements.  This website has more information on Xarelto: [email or web address]

## 2014-10-29 LAB — URINE CULTURE: Culture: NO GROWTH

## 2014-10-29 NOTE — Telephone Encounter (Signed)
Noted recent development of PE. Xarelto was started yesterday,  R leg is feeling better - less tightness.  Still some bleeding in urine, but no clots. Has follow up with urology tomorrow. I discussed with his urologist Dr. Venia Minks yesterday regarding recent developments. Concern of lithotripsy next week with large DVT and superficial thrombus as will need to be in lithotomy position for this procedure. Call placed to hematology to discuss this with them and to arrange for eval at their office as unsuccessful in prior referral.

## 2014-10-29 NOTE — Addendum Note (Signed)
Addended by: [name] R on: 10/29/2014 03:57 PM   Modules accepted: Orders

## 2014-11-02 ENCOUNTER — Telehealth: Payer: Self-pay | Admitting: Family Medicine

## 2014-11-02 NOTE — Telephone Encounter (Signed)
Called to check status. Leg pain has improved, discolored areas  Still has been having some blood in urine - off and on, but not passing clots. Had blood count drawn at nephrologist office 3 days ago.  Still waiting on results. Did have normal urine culture at their office as well.  Discussed with him my conversation with hematologist, Dr. Alvy Bimler regarding timing of laser lithotripsy. Ideally would recommend against any procedure within fist 30 days of new DVT, but if procedure needed, may need to be changed to Lovenox, or heparin inpatient then held for procedure. Can coordinate this once we discuss recommendations and determine acuity of this procedure with nephrologist. Referral placed to Dr. Alvy Bimler anyway for evaluation of leukocytosis.   August 1st is busy time for him with work, and would ideally need to have procedure done before that time.  Will discuss plan further this week woth Dr. Thomasene Mohair or Dr. Venia Minks, and may need to coordinate their plan with hematology in setting of DVT and anticoagulation.

## 2014-11-04 ENCOUNTER — Telehealth: Payer: Self-pay | Admitting: Family Medicine

## 2014-11-04 NOTE — Telephone Encounter (Signed)
Patient called about about several issues. Did not understand what he was saying although I did catch the words urologist and hematologist. Please call patient back for more information.   [phone]

## 2014-11-05 NOTE — Telephone Encounter (Signed)
Spoke with pt, he states Dr. Carlota Raspberry took care of this already.

## 2014-11-11 ENCOUNTER — Telehealth: Payer: Self-pay

## 2014-11-11 NOTE — Telephone Encounter (Signed)
PATIENT WOULD LIKE DR. GREENE TO KNOW THAT HE SAW HEMATOLOGY ON Tuesday. HIS WHITE BLOOD COUNT WAS 30,000 AND HIS LYMPS. WERE 75%. THEY SENT OFF BLOOD WORK FOR FURTHER CONSULT. BEST PHONE [phone] (CELL) [place]

## 2014-11-17 NOTE — Telephone Encounter (Signed)
Patient states that he has a urology appointment tomorrow and he is having laser surgery next week. Please call him at [phone]

## 2014-11-18 ENCOUNTER — Telehealth: Payer: Self-pay

## 2014-11-18 NOTE — Telephone Encounter (Signed)
Henry Hinton from [facility] in [name] states they need clearance for surgery from Dr. Carlota Raspberry. Does the patient have to be seen or can this be approved over the phone? Henry Hinton states she can send a fax on their letter head as well. Surgery is Monday so they need clearance ASAP. Cb# [phone].

## 2014-11-18 NOTE — Telephone Encounter (Signed)
FYI Dr Greene °

## 2014-11-18 NOTE — Telephone Encounter (Signed)
Pt is returning dr Carlota Raspberry call and wanted to let him know that dr Venia Minks is scheduing for surgery on Monday to remove kidney stone

## 2014-11-19 ENCOUNTER — Ambulatory Visit (INDEPENDENT_AMBULATORY_CARE_PROVIDER_SITE_OTHER): Payer: BC Managed Care – PPO | Admitting: Family Medicine

## 2014-11-19 VITALS — BP 106/71 | HR 92 | Temp 97.8°F | Resp 18 | Ht 69.5 in | Wt 375.2 lb

## 2014-11-19 DIAGNOSIS — D72829 Elevated white blood cell count, unspecified: Secondary | ICD-10-CM | POA: Diagnosis not present

## 2014-11-19 DIAGNOSIS — N200 Calculus of kidney: Secondary | ICD-10-CM

## 2014-11-19 DIAGNOSIS — Z01818 Encounter for other preprocedural examination: Secondary | ICD-10-CM

## 2014-11-19 DIAGNOSIS — R319 Hematuria, unspecified: Secondary | ICD-10-CM

## 2014-11-19 DIAGNOSIS — I82401 Acute embolism and thrombosis of unspecified deep veins of right lower extremity: Secondary | ICD-10-CM

## 2014-11-19 LAB — POCT CBC
Granulocyte percent: 24.9 %G — AB (ref 37–80)
HCT, POC: 38.9 % — AB (ref 43.5–53.7)
Hemoglobin: 12.4 g/dL — AB (ref 14.1–18.1)
LYMPH, POC: 19.6 — AB (ref 0.6–3.4)
MCH, POC: 27.1 pg (ref 27–31.2)
MCHC: 32 g/dL (ref 31.8–35.4)
MCV: 84.7 fL (ref 80–97)
MID (cbc): 0.9 (ref 0–0.9)
MPV: 7.9 fL (ref 0–99.8)
PLATELET COUNT, POC: 230 10*3/uL (ref 142–424)
POC Granulocyte: 6.8 (ref 2–6.9)
POC LYMPH PERCENT: 71.8 %L — AB (ref 10–50)
POC MID %: 3.3 % (ref 0–12)
RBC: 4.59 M/uL — AB (ref 4.69–6.13)
RDW, POC: 15.8 %
WBC: 27.3 10*3/uL — AB (ref 4.6–10.2)

## 2014-11-19 MED ORDER — RIVAROXABAN 20 MG PO TABS: 20.0000 mg | ORAL_TABLET | Freq: Every day | ORAL | Status: DC

## 2014-11-19 NOTE — Progress Notes (Addendum)
Subjective:  This chart was scribed for Corliss Parish, MD by Leandra Kern, Medical Scribe. This patient was seen in Room 3 and the patient's care was started at 8:07 PM.   Patient ID: Henry Hinton, male    DOB: [birth year], 54 y.o.   MRN: [id number]  HPI HPI Comments: Henry Hinton is a 54 y.o. male who presents to [facility] for a consult for surgical release.   He is here for evaluation on surgery on Monday. See previous notes. He is most recently seen June 28th, needed to discuss his kidney stone and travel to [place] last month, with possible secondary infection. He had a 7 mm kidney stone with ureteral stent place. He has followed by Dr. Venia Minks in [facility]. Initially was scheduled to have surgery a few weeks ago with cystoscopic manipulation of stone with laser assist.  He was found to have a DVT on June 29th which involves the right gastrocnemius vein and acute superficial vein thrombosis of right frater saphenous vein. Additionally he has an elevated white blood cell count most recently at 30,000. Seen by hematology 9 days ago, and he has been evaluated for this abnormal blood count and for the work-up of atypical lymphocytes. I discussed possible urologic surgery with Dr. Alvy Bimler on July 4th, he recommended against the procedure with the first 30 days with DVT. Request received from urology office to clear him for surgery 4 days ago.   Today, pt notes that he saw his hematologist Dr. [name] at [facility] last Tuesday. The provider was aware of the elevated white blood count. Pt has an appointment with him again in Aug 1st. Pt notes that he will procedures done in three days where he will have a breakup of the stones, as well as the ones in the kidney, in addition to the removal of other stents and putting a new stent in. Pt will be in general [facility] for the surgery that will probably last for about an hour and a half. Pt notes that he usually gets a hoarse voice  as a side effect from [place], however no other major problems. Pt was last put under [facility] on June 21st. . Most recent hemoglobin was 13.1, WBC was 30, and platelets were 323 on July 12th. Most recent creatinine was normal at 0.7 on July 1st.   Today Pt denies leg pain, shortness of breath, or chest pain with exercise or activity. Pt is requesting a medication refill for xarelto. Pt notes that he has never had any blood sugar or blood pressure problems. Pt reports no history of cardiac complications. He had a Stress Echocardiogram in Jan 2015 with  EF 65%, low risk study, no signs of ischemia. Pt reports that he has not been doing much activity this past month other than swimming about 3-4 times a week. He denies experiencing chest pain or shortness of breath while doing this activity. Additionally, pt reports that he ges ear ache while yawning.     Patient Active Problem List   Diagnosis Date Noted  . Leukocytosis (leucocytosis) 08/29/2014  . HNP (herniated nucleus pulposus), cervical 01/21/2013  . bilat hip replacement 04/08/2012  . Morbid obesity 04/08/2012  . Edema 04/08/2012   Past Medical History  Diagnosis Date  . Family history of anesthesia complication     " father having delirium after anesthesia"  . Arthritis   . Numbness and tingling     Hx: of in right arm  .  Heart murmur     Hx; of as a child  . Joint pain     Hx: of periodically  . Sleep apnea   . Pneumonia   . Kidney stones     Hx; of as a child  . Anemia    Past Surgical History  Procedure Laterality Date  . Lumbar laminectomy      Hx: of 2005  . Joint replacement    . Total hip arthroplasty      Hx: of B/L hips  . Tenosynovitis      Hx: of thumb  . Tonsillectomy    . Colonoscopy      Hx: of  . Posterior cervical laminectomy with met- rx Right 01/21/2013    Procedure: Right Sitting C6-7 Microdiskectomy with Met-rex;  Surgeon: Kristeen Miss, MD;  Location: [place] [facility];  Service: Neurosurgery;   Laterality: Right;  Right Sitting C6-7 Microdiskectomy with Met-rex  . Spine surgery    . Knee surgery Left 07/21/2014    Dr. Noemi Chapel  . Stent placement rt ureter ([facility] hx)  10/23/2014   No Known Allergies Prior to Admission medications   Medication Sig Start Date End Date Taking? Authorizing Provider  docusate sodium (COLACE) 100 MG capsule Take 100 mg by mouth 2 (two) times daily.    Yes Historical Provider, MD  ferrous sulfate (IRON SUPPLEMENT) 325 (65 FE) MG tablet Take 325 mg by mouth daily with breakfast.   Yes Historical Provider, MD  furosemide (LASIX) 40 MG tablet Take 1 tablet (40 mg total) by mouth daily. 08/27/14  Yes [name], MD  oxybutynin (DITROPAN) 5 MG tablet Take 5 mg by mouth 3 (three) times daily.   Yes Historical Provider, MD  oxyCODONE-acetaminophen (PERCOCET/ROXICET) 5-325 MG per tablet Take 1 tablet by mouth every 4 (four) hours as needed for severe pain.   Yes Historical Provider, MD  phenazopyridine (PYRIDIUM) 200 MG tablet Take 200 mg by mouth 3 (three) times daily as needed for pain.   Yes Historical Provider, MD  potassium chloride (KLOR-CON 10) 10 MEQ tablet TAKE 1 TABLET (10 MEQ TOTAL) BY MOUTH DAILY. 08/27/14  Yes [name], MD  tamsulosin (FLOMAX) 0.4 MG CAPS capsule Take 0.4 mg by mouth daily.    Yes Historical Provider, MD  XARELTO STARTER PACK 15 & 20 MG TBPK Take 15-20 mg by mouth as directed. Take as directed on package: Start with one 3m tablet by mouth twice a day with food. On Day 22, switch to one 257mtablet once a day with food. 10/28/14  Yes FoPamella PertMD  aspirin 81 MG tablet Take 81 mg by mouth daily.    Historical Provider, MD  ibuprofen (ADVIL,MOTRIN) 200 MG tablet Take 800-1,000 mg by mouth every 6 (six) hours as needed for moderate pain.     Historical Provider, MD  Multiple Vitamins-Minerals (MENS 50+ MULTI VITAMIN/MIN) TABS Take 1 tablet by mouth daily.     Historical Provider, MD   History   Social History  . Marital  Status: Divorced    Spouse Name: N/A  . Number of Children: N/A  . Years of Education: N/A   Occupational History  . 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 Smoking status: Never Smoker   . Smokeless tobacco: Former [employer] developer  Types: [place]: 05/01/1993  . Alcohol Use: 0.6 oz/week    1 Standard drinks or equivalent per week  Comment: BEER AND LIQUOR  . Drug Use: No  . Sexual Activity: No   Other Topics Concern  . Not on file   Social History Narrative    Review of Systems  HENT: Positive for ear pain.   Respiratory: Negative for shortness of breath.   Cardiovascular: Negative for chest pain.  Musculoskeletal: Negative for myalgias.      Objective:   Physical Exam  Constitutional: He is oriented to person, place, and time. He appears well-developed and well-nourished. No distress.  HENT:  Head: Normocephalic and atraumatic.  Right Ear: External ear normal.  Left Ear: External ear normal.  Eyes: EOM are normal. Pupils are equal, round, and reactive to light.  Neck: Neck supple. No JVD present. Carotid bruit is not present.  Cardiovascular: Normal rate, regular rhythm and normal heart sounds.   No murmur heard. Pulmonary/Chest: Effort normal and breath sounds normal. He has no rales.  Musculoskeletal: He exhibits no edema.  Neurological: He is alert and oriented to person, place, and time. No cranial nerve deficit.  Skin: Skin is warm and dry.  Trace pedal edema with some stasis changes on the skin.   Psychiatric: He has a normal mood and affect. His behavior is normal.  Nursing note and vitals reviewed.  Results for orders placed or performed in visit on 11/19/14  POCT CBC  Result Value Ref Range   WBC 27.3 (A) 4.6 - 10.2 K/uL   Lymph, poc 19.6 (A) 0.6 - 3.4   POC LYMPH PERCENT 71.8 (A) 10 - 50 %L   MID (cbc) 0.9 0 - 0.9   POC MID % 3.3 0 - 12 %M   POC Granulocyte 6.8 2 - 6.9   Granulocyte percent 24.9 (A) 37  - 80 %G   RBC 4.59 (A) 4.69 - 6.13 M/uL   Hemoglobin 12.4 (A) 14.1 - 18.1 g/dL   HCT, POC 38.9 (A) 43.5 - 53.7 %   MCV 84.7 80 - 97 fL   MCH, POC 27.1 27 - 31.2 pg   MCHC 32.0 31.8 - 35.4 g/dL   RDW, POC 15.8 %   Platelet Count, POC 230 142 - 424 K/uL   MPV 7.9 0 - 99.8 fL   EKG: SR, no acute findings.      Assessment & Plan:   Henry Hinton is a 54 y.o. male Preoperative clearance - Plan: EKG 76-HMCN, Basic metabolic panel  -See above and let are provided for his specialist. He appears to be absent note increased risk of major adverse cardiac events from anesthesia. He had similar procedure approximately one month ago and tolerated without difficulty. Only changes new onset of DVT since that time, and now on Xarelto. Discussed increased risk of bleeding perioperatively and noted this on his form. Stress testing last year reassuring and no exertional chest pain or difficulty with swimming for exercise.  Nephrolith - Plan: Basic metabolic panel  -Planning on laser assisted lithotripsy. Will check BMP to make sure renal function is still stable, and proceed with nephrologist  DVT (deep venous thrombosis), right - Plan: rivaroxaban (XARELTO) 20 MG TABS tablet  -Continue on Xarelto now at 20 mg daily. As above discuss bleeding risk for upcoming procedure. Asymptomatic in his lower leg. Could consider repeat ultrasound for resolution of DVT in the next few weeks.  Leukocytosis - Plan: POCT CBC  -Persistent, with atypical lymphocytes.  Now under the care of a hematologist, still considering CLL in differential. Still elevated, but stable with white blood  cells down 3 points from his last test. To follow-up with hematology.  Hematuria - Plan: Basic metabolic panel  -Planning on lithotripsy as above, BMP pending for renal function  Plan on follow-up after he has finished his lithotripsy procedure and evaluation with hematology.   Meds ordered this encounter  Medications  .  phenazopyridine (PYRIDIUM) 200 MG tablet    Sig: Take 200 mg by mouth 3 (three) times daily as needed for pain.  Marland Kitchen oxybutynin (DITROPAN) 5 MG tablet    Sig: Take 5 mg by mouth 3 (three) times daily.  . rivaroxaban (XARELTO) 20 MG TABS tablet    Sig: Take 1 tablet (20 mg total) by mouth daily with supper.    Dispense:  30 tablet    Refill:  1   Patient Instructions  I refilled Xarelto, and provided letter for your surgeon.  If they need other info - let me know.     I personally performed the services described in this documentation, which was scribed in my presence. The recorded information has been reviewed and considered, and addended by me as needed.

## 2014-11-19 NOTE — Telephone Encounter (Signed)
Planning for stone manipulation with laser surgery this Monday.  Recommended he follow up with me in office to discuss clearance for surgery - may need EKG or at least electrolytes to determine stability.

## 2014-11-19 NOTE — Telephone Encounter (Signed)
Courtney at [facility]  Trying to reach Dr. Carlota Raspberry to ok surgery for patient on Monday 11/23/14 with DR Rosario Adie. Their office is needing to speak with Dr Carlota Raspberry to obtain verbal or written authorization to preform surgery. Please contact as soon as possible [phone]

## 2014-11-19 NOTE — Patient Instructions (Signed)
I refilled Xarelto, and provided letter for your surgeon.  If they need other info - let me know.

## 2014-11-20 LAB — BASIC METABOLIC PANEL
BUN: 19 mg/dL (ref 6–23)
CO2: 27 meq/L (ref 19–32)
Calcium: 8.7 mg/dL (ref 8.4–10.5)
Chloride: 106 mEq/L (ref 96–112)
Creat: 0.78 mg/dL (ref 0.50–1.35)
Glucose, Bld: 105 mg/dL — ABNORMAL HIGH (ref 70–99)
Potassium: 4.1 mEq/L (ref 3.5–5.3)
SODIUM: 141 meq/L (ref 135–145)

## 2014-11-20 NOTE — Telephone Encounter (Signed)
Courtney from [facility] was advised of this and will contact patient.

## 2014-11-21 ENCOUNTER — Encounter: Payer: Self-pay | Admitting: Family Medicine

## 2014-11-30 DIAGNOSIS — C801 Malignant (primary) neoplasm, unspecified: Secondary | ICD-10-CM

## 2014-11-30 HISTORY — DX: Malignant (primary) neoplasm, unspecified: C80.1

## 2014-12-03 ENCOUNTER — Telehealth: Payer: Self-pay

## 2014-12-03 DIAGNOSIS — R252 Cramp and spasm: Secondary | ICD-10-CM

## 2014-12-03 NOTE — Telephone Encounter (Signed)
Kidney stones gone,  Still working with Hemotologist blood results.   Needs to converse on side effects of rivaroxaban (XARELTO) 20 MG TABS tablet   Please call back [phone]

## 2014-12-04 NOTE — Telephone Encounter (Signed)
Left message for pt to call back  °

## 2014-12-07 NOTE — Telephone Encounter (Signed)
Spoke with pt, he states the medication is making him cramp. He has even started taking it at night and he still wakes up with the cramps. He is hoping he will get off this medication at the end of this month. He wanted to let Dr. Carlota Raspberry know he had a 88mm kidney stoned lasered and removed 2 weeks ago and he still has a 49mm in his right kidney. He has an appointment with his Hematologist Monday and his last blood tests were WBC 22 thousand and Lymph 70%. He states his diagnosis is either CLL or Mantels Lymphoma. Pt states Dr. Carlota Raspberry can call him if he wants.

## 2014-12-09 NOTE — Telephone Encounter (Signed)
Has been experiencing cramping in feet or legs, throughout day, thinks it is from [place].  tried taking dose in evening - now cramping only at night. Tried otc calcium tablet.   Min relief.  He is still taking Lasix and  Potassium supplement. Will place order for BMP, magnesium level lab only visit.   If cramping persist and these are both normal, will need to look at other medications or possible CPK level.   Sooner if worse.  In regards to his DVT treatment, discussed I would not want to take him off Xarelto just quite yet, we may need to look at a repeat ultrasound to make sure that DVT has cleared, then consider taking him off of Xarelto but would most likely want to coordinate this also with his hematologist.

## 2015-01-17 ENCOUNTER — Other Ambulatory Visit: Payer: Self-pay | Admitting: Family Medicine

## 2015-02-15 ENCOUNTER — Ambulatory Visit (INDEPENDENT_AMBULATORY_CARE_PROVIDER_SITE_OTHER): Payer: BC Managed Care – PPO | Admitting: Family Medicine

## 2015-02-15 ENCOUNTER — Encounter: Payer: Self-pay | Admitting: Family Medicine

## 2015-02-15 VITALS — BP 137/84 | HR 64 | Temp 98.5°F | Resp 16 | Ht 68.0 in | Wt 388.8 lb

## 2015-02-15 DIAGNOSIS — C9110 Chronic lymphocytic leukemia of B-cell type not having achieved remission: Secondary | ICD-10-CM

## 2015-02-15 DIAGNOSIS — R600 Localized edema: Secondary | ICD-10-CM

## 2015-02-15 DIAGNOSIS — K4090 Unilateral inguinal hernia, without obstruction or gangrene, not specified as recurrent: Secondary | ICD-10-CM

## 2015-02-15 DIAGNOSIS — E785 Hyperlipidemia, unspecified: Secondary | ICD-10-CM | POA: Diagnosis not present

## 2015-02-15 DIAGNOSIS — Z86718 Personal history of other venous thrombosis and embolism: Secondary | ICD-10-CM

## 2015-02-15 DIAGNOSIS — E669 Obesity, unspecified: Secondary | ICD-10-CM | POA: Diagnosis not present

## 2015-02-15 DIAGNOSIS — Z0000 Encounter for general adult medical examination without abnormal findings: Secondary | ICD-10-CM

## 2015-02-15 DIAGNOSIS — Z125 Encounter for screening for malignant neoplasm of prostate: Secondary | ICD-10-CM | POA: Diagnosis not present

## 2015-02-15 DIAGNOSIS — N200 Calculus of kidney: Secondary | ICD-10-CM

## 2015-02-15 LAB — COMPLETE METABOLIC PANEL WITH GFR
ALBUMIN: 3.9 g/dL (ref 3.6–5.1)
ALK PHOS: 97 U/L (ref 40–115)
ALT: 19 U/L (ref 9–46)
AST: 19 U/L (ref 10–35)
BUN: 14 mg/dL (ref 7–25)
CALCIUM: 8.5 mg/dL — AB (ref 8.6–10.3)
CO2: 25 mmol/L (ref 20–31)
CREATININE: 0.62 mg/dL — AB (ref 0.70–1.33)
Chloride: 105 mmol/L (ref 98–110)
Glucose, Bld: 91 mg/dL (ref 65–99)
Potassium: 4.1 mmol/L (ref 3.5–5.3)
Sodium: 140 mmol/L (ref 135–146)
TOTAL PROTEIN: 7 g/dL (ref 6.1–8.1)
Total Bilirubin: 0.5 mg/dL (ref 0.2–1.2)

## 2015-02-15 LAB — LIPID PANEL
CHOLESTEROL: 175 mg/dL (ref 125–200)
HDL: 50 mg/dL (ref >=40)
LDL Cholesterol: 99 mg/dL (ref <130)
TRIGLYCERIDES: 128 mg/dL (ref <150)
Total CHOL/HDL Ratio: 3.5 Ratio (ref <=5.0)
VLDL: 26 mg/dL (ref <30)

## 2015-02-15 MED ORDER — FUROSEMIDE 40 MG PO TABS: 40.0000 mg | ORAL_TABLET | Freq: Every day | ORAL | Status: DC

## 2015-02-15 MED ORDER — POTASSIUM CHLORIDE ER 10 MEQ PO TBCR: EXTENDED_RELEASE_TABLET | ORAL | Status: DC

## 2015-02-15 NOTE — Patient Instructions (Addendum)
Decrease portion sizes and sodium as much as possible. Smaller frequent meals throughout the day are better than few large meals.  Avoid late night eating, and liquid calories as much as possible.    I would recommend flu vaccine and pneumonia vaccination with the [facility]. See information below and call me or email if you want to have this done as I can order without an office visit.   No change in meds for now.   Keeping you healthy  Get these tests  Blood pressure- Have your blood pressure checked once a year by your healthcare provider.  Normal blood pressure is 120/80  Weight- Have your body mass index (BMI) calculated to screen for obesity.  BMI is a measure of body fat based on height and weight. You can also calculate your own BMI at [email or web address].  Cholesterol- Have your cholesterol checked every year.  Diabetes- Have your blood sugar checked regularly if you have high blood pressure, high cholesterol, have a family history of diabetes or if you are overweight.  Screening for Colon Cancer- Colonoscopy starting at age 51.  Screening may begin sooner depending on your family history and other health conditions. Follow up colonoscopy as directed by your Gastroenterologist.  Screening for Prostate Cancer- Both blood work (PSA) and a rectal exam help screen for Prostate Cancer.  Screening begins at age 49 with African-American men and at age 1 with Caucasian men.  Screening may begin sooner depending on your family history.  Take these medicines  Aspirin- One aspirin daily can help prevent Heart disease and Stroke.  Flu shot- Every fall.  Tetanus- Every 10 years.  Zostavax- Once after the age of 33 to prevent Shingles.  Pneumonia shot- Once after the age of 36; if you are younger than 88, ask your healthcare provider if you need a Pneumonia shot.  Take these steps  Don't smoke- If you do smoke, talk to your doctor about quitting.  For tips on how to quit, go to  www.smokefree.gov or call 1-800-QUIT-NOW.  Be physically active- Exercise 5 days a week for at least 30 minutes.  If you are not already physically active start slow and gradually work up to 30 minutes of moderate physical activity.  Examples of moderate activity include walking briskly, mowing the yard, dancing, swimming, bicycling, etc.  Eat a healthy diet- Eat a variety of healthy food such as fruits, vegetables, low fat milk, low fat cheese, yogurt, lean meant, poultry, fish, beans, tofu, etc. For more information go to www.thenutritionsource.org  Drink alcohol in moderation- Limit alcohol intake to less than two drinks a day. Never drink and drive.  Dentist- Brush and floss twice daily; visit your dentist twice a year.  Depression- Your emotional health is as important as your physical health. If you're feeling down, or losing interest in things you would normally enjoy please talk to your healthcare provider.  Eye exam- Visit your eye doctor every year.  Safe sex- If you may be exposed to a sexually transmitted infection, use a condom.  Seat belts- Seat belts can save your life; always wear one.  Smoke/Carbon Monoxide detectors- These detectors need to be installed on the appropriate level of your home.  Replace batteries at least once a year.  Skin cancer- When out in the sun, cover up and use sunscreen 15 SPF or higher.  Violence- If anyone is threatening you, please tell your healthcare provider.  Living Will/ Health care power of attorney- Speak with your healthcare provider and  family.  Low-Sodium Eating Plan Sodium raises blood pressure and causes water to be held in the body. Getting less sodium from food will help lower your blood pressure, reduce any swelling, and protect your heart, liver, and kidneys. We get sodium by adding salt (sodium chloride) to food. Most of our sodium comes from canned, boxed, and frozen foods. Restaurant foods, fast foods, and pizza are also very  high in sodium. Even if you take medicine to lower your blood pressure or to reduce fluid in your body, getting less sodium from your food is important. WHAT IS MY PLAN? Most people should limit their sodium intake to 2,300 mg a day. Your health care provider recommends that you limit your sodium intake to __________ a day.  WHAT DO I NEED TO KNOW ABOUT THIS EATING PLAN? For the low-sodium eating plan, you will follow these general guidelines: 7. Choose foods with a % Daily Value for sodium of less than 5% (as listed on the food label).  8. Use salt-free seasonings or herbs instead of table salt or sea salt.  9. Check with your health care provider or pharmacist before using salt substitutes.  10. Eat fresh foods. 11. Eat more vegetables and fruits. 12. Limit canned vegetables. If you do use them, rinse them well to decrease the sodium.  13. Limit cheese to 1 oz (28 g) per day.  14. Eat lower-sodium products, often labeled as "lower sodium" or "no salt added." 15. Avoid foods that contain monosodium glutamate (MSG). MSG is sometimes added to [place] food and some canned foods. 16. Check food labels (Nutrition Facts labels) on foods to learn how much sodium is in one serving. 7. Eat more home-cooked food and less restaurant, buffet, and fast food. 18. When eating at a restaurant, ask that your food be prepared with less salt, or no salt if possible.  HOW DO I READ FOOD LABELS FOR SODIUM INFORMATION? The Nutrition Facts label lists the amount of sodium in one serving of the food. If you eat more than one serving, you must multiply the listed amount of sodium by the number of servings. Food labels may also identify foods as: 6. Sodium free--Less than 5 mg in a serving. 7. Very low sodium--35 mg or less in a serving. 8. Low sodium--140 mg or less in a serving. 9. Light in sodium--50% less sodium in a serving. For example, if a food that usually has 300 mg of sodium is changed to become  light in sodium, it will have 150 mg of sodium. 10. Reduced sodium--25% less sodium in a serving. For example, if a food that usually has 400 mg of sodium is changed to reduced sodium, it will have 300 mg of sodium. WHAT FOODS CAN I EAT? Grains Low-sodium cereals, including oats, puffed wheat and rice, and shredded wheat cereals. Low-sodium crackers. Unsalted rice and pasta. Lower-sodium bread.  Vegetables Frozen or fresh vegetables. Low-sodium or reduced-sodium canned vegetables. Low-sodium or reduced-sodium tomato sauce and paste. Low-sodium or reduced-sodium tomato and vegetable juices.  Fruits Fresh, frozen, and canned fruit. Fruit juice.  Meat and Other Protein Products Low-sodium canned tuna and salmon. Fresh or frozen meat, poultry, seafood, and fish. Lamb. Unsalted nuts. Dried beans, peas, and lentils without added salt. Unsalted canned beans. Homemade soups without salt. Eggs.  Dairy Milk. Soy milk. Ricotta cheese. Low-sodium or reduced-sodium cheeses. Yogurt.  Condiments Fresh and dried herbs and spices. Salt-free seasonings. Onion and garlic powders. Low-sodium varieties of mustard and ketchup. Fresh or  refrigerated horseradish. Lemon juice.  Fats and Oils Reduced-sodium salad dressings. Unsalted butter.  Other Unsalted popcorn and pretzels.  The items listed above may not be a complete list of recommended foods or beverages. Contact your dietitian for more options. WHAT FOODS ARE NOT RECOMMENDED? Grains Instant hot cereals. Bread stuffing, pancake, and biscuit mixes. Croutons. Seasoned rice or pasta mixes. Noodle soup cups. Boxed or frozen macaroni and cheese. Self-rising flour. Regular salted crackers. Vegetables Regular canned vegetables. Regular canned tomato sauce and paste. Regular tomato and vegetable juices. Frozen vegetables in sauces. Salted [place] fries. Olives. Angie Fava. Relishes. Sauerkraut. Salsa. Meat and Other Protein Products Salted, canned, smoked,  spiced, or pickled meats, seafood, or fish. Bacon, ham, sausage, hot dogs, corned beef, chipped beef, and packaged luncheon meats. Salt pork. Jerky. Pickled herring. Anchovies, regular canned tuna, and sardines. Salted nuts. Dairy Processed cheese and cheese spreads. Cheese curds. Blue cheese and cottage cheese. Buttermilk.  Condiments Onion and garlic salt, seasoned salt, table salt, and sea salt. Canned and packaged gravies. Worcestershire sauce. Tartar sauce. Barbecue sauce. Teriyaki sauce. Soy sauce, including reduced sodium. Steak sauce. Fish sauce. Oyster sauce. Cocktail sauce. Horseradish that you find on the shelf. Regular ketchup and mustard. Meat flavorings and tenderizers. Bouillon cubes. Hot sauce. Tabasco sauce. Marinades. Taco seasonings. Relishes. Fats and Oils Regular salad dressings. Salted butter. Margarine. Ghee. Bacon fat.  Other Potato and tortilla chips. Corn chips and puffs. Salted popcorn and pretzels. Canned or dried soups. Pizza. Frozen entrees and pot pies.  The items listed above may not be a complete list of foods and beverages to avoid. Contact your dietitian for more information.   This information is not intended to replace advice given to you by your health care provider. Make sure you discuss any questions you have with your health care provider.   Document Released: 10/07/2001 Document Revised: 05/08/2014 Document Reviewed: 02/19/2013 Elsevier Interactive Patient Education 2016 [place].  Pneumococcal Vaccine, Polyvalent suspension for injection What is this medicine? PNEUMOCOCCAL VACCINE (NEU mo KOK al vak SEEN) is a vaccine used to prevent pneumococcus bacterial infections. These bacteria can cause serious infections like pneumonia, meningitis, and blood infections. This vaccine will lower your chance of getting pneumonia. If you do get pneumonia, it can make your symptoms milder and your illness shorter. This vaccine will not treat an infection and  will not cause infection. This vaccine is recommended for infants and young children, adults with certain medical conditions, and adults 51 years or older. This medicine may be used for other purposes; ask your health care provider or pharmacist if you have questions. What should I tell my health care provider before I take this medicine? They need to know if you have any of these conditions: -bleeding problems -fever -immune system problems -an unusual or allergic reaction to pneumococcal vaccine, diphtheria toxoid, other vaccines, latex, other medicines, foods, dyes, or preservatives -pregnant or trying to get pregnant -breast-feeding How should I use this medicine? This vaccine is for injection into a muscle. It is given by a health care professional. A copy of Vaccine Information Statements will be given before each vaccination. Read this sheet carefully each time. The sheet may change frequently. Talk to your pediatrician regarding the use of this medicine in children. While this drug may be prescribed for children as young as 56 weeks old for selected conditions, precautions do apply. Overdosage: If you think you have taken too much of this medicine contact a poison control center or emergency room at  once. NOTE: This medicine is only for you. Do not share this medicine with others. What if I miss a dose? It is important not to miss your dose. Call your doctor or health care professional if you are unable to keep an appointment. What may interact with this medicine? -medicines for cancer chemotherapy -medicines that suppress your immune function -steroid medicines like prednisone or cortisone This list may not describe all possible interactions. Give your health care provider a list of all the medicines, herbs, non-prescription drugs, or dietary supplements you use. Also tell them if you smoke, drink alcohol, or use illegal drugs. Some items may interact with your medicine. What should I  watch for while using this medicine? Mild fever and pain should go away in 3 days or less. Report any unusual symptoms to your doctor or health care professional. What side effects may I notice from receiving this medicine? Side effects that you should report to your doctor or health care professional as soon as possible: -allergic reactions like skin rash, itching or hives, swelling of the face, lips, or tongue -breathing problems -confused -fast or irregular heartbeat -fever over 102 degrees F -seizures -unusual bleeding or bruising -unusual muscle weakness Side effects that usually do not require medical attention (report to your doctor or health care professional if they continue or are bothersome): -aches and pains -diarrhea -fever of 102 degrees F or less -headache -irritable -loss of appetite -pain, tender at site where injected -trouble sleeping This list may not describe all possible side effects. Call your doctor for medical advice about side effects. You may report side effects to FDA at 1-800-FDA-1088. Where should I keep my medicine? This does not apply. This vaccine is given in a clinic, pharmacy, doctor's office, or other health care setting and will not be stored at home. NOTE: This sheet is a summary. It may not cover all possible information. If you have questions about this medicine, talk to your doctor, pharmacist, or health care provider.    2016, Elsevier/[employer] Standard. (2014-01-22 10:27:27)

## 2015-02-15 NOTE — Progress Notes (Signed)
Subjective:  This chart was scribed for Henry Ray, MD by Henry Hinton, Medical Scribe. This patient was seen in room 24 and the patient's care was started 3:19 PM.    Patient ID: Henry Hinton, male    DOB: 06-01-1960, 54 y.o.   MRN: [id number]  HPI Henry Hinton is a 54 y.o. male Here for physical. H/o obesity, peripheral edema, nephrolithiasis, right leg DVT in June of this year after travel as well as a persisted leukocytosis with suspected CLL.   Nephrolithiasis See prior visits, he's followed by Dr. Venia Minks in [facility]. Procedure to remove kidney stones on Aug 1st with laser assisted lithotripsy. 36m stone removed, multiple stents right kidney, remaining 326mstone. His Ua done there was high in sodium content.   DVT Diagnosed June 28th. Started on xarelto, possible cramps with xarelto for phone message. Plan to coordinate xarelto with his hematologist. I did place a lab on order for BMP and magnesium, but these were not done. He denies anymore cramping from xarelto.   Leukocytosis In Aug, diagnosis was either CLL or mantles lymphoma. [facility] He states he has CLL.   Obesity with lower extremity edema Lasix 40 mg QD and potassium 10 MEQ QD Wt Readings from Last 3 Encounters:  02/15/15 388 lb 12.8 oz (176.359 kg)  11/19/14 375 lb 3.2 oz (170.19 kg)  10/28/14 374 lb (169.645 kg)  He feels the swelling is the same. He can't wear the compressive socks; he's tried in the past, but it becomes too tight. He has a problem controlling his portion intake, and describes himself as a "volume eater". He eats about 4 meals a day consists of: salad, protein shake after swim, protein soy bar. He drinks beer after school games. He has 3-4 beers on weekends and bourbon. He denies drinking carbonated sodas.   Hernia He had CT abdomen pelvis in October 26, 2014.  Left groin hernia: contains short segment of little sigmoid colon and venous  collateralization in the inguinal regions bilaterally  HLD Lab Results  Component Value Date   CHOL 218* 05/05/2013   HDL 58 05/05/2013   LDLCALC 138* 05/05/2013   TRIG 111 05/05/2013   CHOLHDL 3.8 05/05/2013   Recommended weight loss with diet changes and exercises.   Cancer Screening As above, [facility], normal.  Prostate screening: done today. Urologist said prostate was clean.  Lab Results  Component Value Date   PSA 0.39 11/25/2013   PSA 0.39 05/05/2013   Immunizations Immunization History  Administered Date(s) Administered  . Tdap 07/31/2006   Pneumonia: He'll look into it first.  Flu: He was recommended but declined flu shot.   Dep[facility] 02/15/2015 11/19/2014 10/27/2014 10/26/2014 08/27/2014  Decreased Interest 0 0 0 0 0  Down, Depressed, Hopeless 0 0 0 0 0  PHQ - 2 Score 0 0 0 0 0   Vision  Visual Acuity Screening   Right eye Left eye Both eyes  Without correction:     With correction: 20/20 20/20 20/20  He has seen ophthalmologist within past year.   Dentist He has seen a dentist past year.   Exercise/Activity He swims 3-5 times a week, for 45 minutes. He denies smoking.   Family  His mother is in the hospital with pneumonia and he's concerned.    Patient Active Problem List   Diagnosis Date Noted  .  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 Leukocytosis (leucocytosis) 08/29/2014  . HNP (herniated nucleus pulposus), cervical 01/21/2013  . bilat hip replacement 04/08/2012  . Morbid obesity ([place]) 04/08/2012  . Edema 04/08/2012   Past Medical History  Diagnosis Date  . Family history of anesthesia complication     " father having delirium after anesthesia"  . Arthritis   . Numbness and tingling     Hx: of in right arm  . Heart murmur     Hx; of as a child  . Joint pain     Hx: of periodically  . Sleep apnea   . Pneumonia   . Kidney stones     Hx; of as a child  . Anemia   . Cancer  [facility])     CLL - 11/30/2014   Past Surgical History  Procedure Laterality Date  . Lumbar laminectomy      Hx: of 2005  . Joint replacement    . Total hip arthroplasty      Hx: of B/L hips  . Tenosynovitis      Hx: of thumb  . Tonsillectomy    . Colonoscopy      Hx: of  . Posterior cervical laminectomy with met- rx Right 01/21/2013    Procedure: Right Sitting C6-7 Microdiskectomy with Met-rex;  Surgeon: Kristeen Miss, MD;  Location: [place] [facility];  Service: Neurosurgery;  Laterality: Right;  Right Sitting C6-7 Microdiskectomy with Met-rex  . Spine surgery    . Knee surgery Left 07/21/2014    Dr. Noemi Chapel  . Stent placement rt ureter ([facility] hx)  10/23/2014   Allergies  Allergen Reactions  . Adhesive [Tape] Other (See Comments)    Blisters and rash at site   Prior to Admission medications   Medication Sig Start Date End Date Taking? Authorizing Provider  docusate sodium (COLACE) 100 MG capsule Take 100 mg by mouth 2 (two) times daily.     Historical Provider, MD  ferrous sulfate (IRON SUPPLEMENT) 325 (65 FE) MG tablet Take 325 mg by mouth daily with breakfast.    Historical Provider, MD  furosemide (LASIX) 40 MG tablet Take 1 tablet (40 mg total) by mouth daily. 08/27/14   [name], MD  Multiple Vitamins-Minerals (MENS 50+ MULTI VITAMIN/MIN) TABS Take 1 tablet by mouth daily.     Historical Provider, MD  oxybutynin (DITROPAN) 5 MG tablet Take 5 mg by mouth 3 (three) times daily.    Historical Provider, MD  oxyCODONE-acetaminophen (PERCOCET/ROXICET) 5-325 MG per tablet Take 1 tablet by mouth every 4 (four) hours as needed for severe pain.    Historical Provider, MD  phenazopyridine (PYRIDIUM) 200 MG tablet Take 200 mg by mouth 3 (three) times daily as needed for pain.    Historical Provider, MD  potassium chloride (KLOR-CON 10) 10 MEQ tablet TAKE 1 TABLET (10 MEQ TOTAL) BY MOUTH DAILY. 08/27/14   [name], MD  tamsulosin (FLOMAX) 0.4 MG CAPS capsule Take 0.4 mg by mouth daily.      Historical Provider, MD  XARELTO 20 MG TABS tablet TAKE 1 TABLET (20 MG TOTAL) BY MOUTH DAILY WITH SUPPER. 01/18/15   [name], MD   Social History   Social History  . Marital Status: Divorced    Spouse Name: N/A  . Number of Children: N/A  . Years of Education: N/A   Occupational History  . 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 Smoking status: Never Smoker   .  Smokeless tobacco: Former Systems developer    Types: [place] date: 05/01/1993  . Alcohol Use: 0.6 oz/week    1 Standard drinks or equivalent per week     Comment: BE[employer]QU[employer]  . Drug Use: No  . Sexual Activity: No   Other Topics Concern  . Not on file   Social History Narrative   Exercise swimming 3-5 times/week for 45 minutes    Review of Systems  Cardiovascular: Positive for leg swelling.   Positive for leg swelling, joint pain, possible hernias.     Objective:   Physical Exam  Constitutional: He is oriented to person, place, and time. He appears well-developed and well-nourished. No distress.  HENT:  Head: Normocephalic and atraumatic.  Right Ear: External ear normal.  Left Ear: External ear normal.  Mouth/Throat: Oropharynx is clear and moist.  Eyes: Conjunctivae and EOM are normal. Pupils are equal, round, and reactive to light.  Neck: Normal range of motion. Neck supple. No thyromegaly present.  Cardiovascular: Normal rate, regular rhythm, normal heart sounds and intact distal pulses.   Pulmonary/Chest: Effort normal and breath sounds normal. No respiratory distress. He has no wheezes.  Abdominal: Soft. He exhibits no distension. There is no tenderness. Hernia confirmed negative in the right inguinal area and confirmed negative in the left inguinal area.  Genitourinary:  No appreciable hernia, but limited somewhat by body habitus. nontender exam.   Musculoskeletal: Normal range of motion. He exhibits edema. He exhibits no tenderness.  Lower extremities:  lower status changes, non pitting edema.   Lymphadenopathy:    He has no cervical adenopathy.  Neurological: He is alert and oriented to person, place, and time. He has normal reflexes.  Skin: Skin is warm and dry.  Lower leg stasis changes.   Psychiatric: He has a normal mood and affect. His behavior is normal.  Vitals reviewed.   Filed Vitals:   02/15/15 1445  BP: 137/84  Pulse: 64  Temp: 98.5 F (36.9 C)  TempSrc: Oral  Resp: 16  Height: 5' 8" (1.727 m)  Weight: 388 lb 12.8 oz (176.359 kg)       Assessment & Plan:   Tirth Cothron is a 54 y.o. male Annual physical exam  --anticipatory guidance as below in AVS, screening labs above. Health maintenance items as above in HPI discussed/recommended as applicable.   Left inguinal hernia - Plan: Ambulatory referral to General Surgery  -noted by prior CT. Thought to have bowel in hernia at that time. Will have eval by surgeon, as due for colonoscopy, and to determine if repair needed prior to that study.   Obesity  -food choices and portions discussed, and to limit liquid calories. Continue exercise with swimming.   Bilateral edema of lower extremityLower leg edema - Plan: furosemide (LASIX) 40 MG tablet, potassium chloride (KLOR-CON 10) 10 MEQ tablet  -chronic. Continue lasix and potassium at same doses.   CLL (chronic lymphocytic leukemia) ([place])  -stable by report for now.  Keep follow up with hematologist. Denies depressive symptoms.   Nephrolithiasis  -asx presently. RTC precautions, continue routine urology follow up.   Screening for prostate cancer - Plan: PSA  We discussed pros and cons of prostate cancer screening, and after this discussion, he chose to have screening done. PSA obtained, DRE deferred as recently had this done by urology.   Hyperlipidemia - Plan: COMPLETE METABOLIC PANEL WITH GFR, Lipid panel pending.   History of DVT (deep vein thrombosis)  -continue Xarelto, plan  for total 6 months - should be  finishing in December.     Meds ordered this encounter  Medications  . furosemide (LASIX) 40 MG tablet    Sig: Take 1 tablet (40 mg total) by mouth daily.    Dispense:  90 tablet    Refill:  2  . potassium chloride (KLOR-CON 10) 10 MEQ tablet    Sig: TAKE 1 TABLET (10 MEQ TOTAL) BY MOUTH DAILY.    Dispense:  90 tablet    Refill:  2   Patient Instructions   Decrease portion sizes and sodium as much as possible. Smaller frequent meals throughout the day are better than few large meals.  Avoid late night eating, and liquid calories as much as possible.    I would recommend flu vaccine and pneumonia vaccination with the CLL. See information below and call me or email if you want to have this done as I can order without an office visit.   No change in meds for now.   Keeping you healthy  Get these tests  Hinton pressure- Have your Hinton pressure checked once a year by your healthcare provider.  Normal Hinton pressure is 120/80  Weight- Have your body mass index (BMI) calculated to screen for obesity.  BMI is a measure of body fat based on height and weight. You can also calculate your own BMI at ViewBanking.si.  Cholesterol- Have your cholesterol checked every year.  Diabetes- Have your Hinton sugar checked regularly if you have high Hinton pressure, high cholesterol, have a family history of diabetes or if you are overweight.  Screening for Colon Cancer- Colonoscopy starting at age 52.  Screening may begin sooner depending on your family history and other health conditions. Follow up colonoscopy as directed by your Gastroenterologist.  Screening for Prostate Cancer- Both Hinton work (PSA) and a rectal exam help screen for Prostate Cancer.  Screening begins at age 66 with African-American men and at age 36 with Caucasian men.  Screening may begin sooner depending on your family history.  Take these medicines  Aspirin- One aspirin daily can help prevent Heart disease and  Stroke.  Flu shot- Every fall.  Tetanus- Every 10 years.  Zostavax- Once after the age of 65 to prevent Shingles.  Pneumonia shot- Once after the age of 19; if you are younger than 13, ask your healthcare provider if you need a Pneumonia shot.  Take these steps  Don't smoke- If you do smoke, talk to your doctor about quitting.  For tips on how to quit, go to www.smokefree.gov or call 1-800-QUIT-NOW.  Be physically active- Exercise 5 days a week for at least 30 minutes.  If you are not already physically active start slow and gradually work up to 30 minutes of moderate physical activity.  Examples of moderate activity include walking briskly, mowing the yard, dancing, swimming, bicycling, etc.  Eat a healthy diet- Eat a variety of healthy food such as fruits, vegetables, low fat milk, low fat cheese, yogurt, lean meant, poultry, fish, beans, tofu, etc. For more information go to www.thenutritionsource.org  Drink alcohol in moderation- Limit alcohol intake to less than two drinks a day. Never drink and drive.  Dentist- Brush and floss twice daily; visit your dentist twice a year.  Depression- Your emotional health is as important as your physical health. If you're feeling down, or losing interest in things you would normally enjoy please talk to your healthcare provider.  Eye exam- Visit your eye doctor every year.  Safe sex- If you may be exposed to a sexually transmitted infection, use a condom.  Seat belts- Seat belts can save your life; always wear one.  Smoke/Carbon Monoxide detectors- These detectors need to be installed on the appropriate level of your home.  Replace batteries at least once a year.  Skin cancer- When out in the sun, cover up and use sunscreen 15 SPF or higher.  Violence- If anyone is threatening you, please tell your healthcare provider.  Living Will/ Health care power of attorney- Speak with your healthcare provider and family.  Low-Sodium Eating  Plan Sodium raises Hinton pressure and causes water to be held in the body. Getting less sodium from food will help lower your Hinton pressure, reduce any swelling, and protect your heart, liver, and kidneys. We get sodium by adding salt (sodium chloride) to food. Most of our sodium comes from canned, boxed, and frozen foods. Restaurant foods, fast foods, and pizza are also very high in sodium. Even if you take medicine to lower your Hinton pressure or to reduce fluid in your body, getting less sodium from your food is important. WHAT IS MY PLAN? Most people should limit their sodium intake to 2,300 mg a day. Your health care provider recommends that you limit your sodium intake to __________ a day.  WHAT DO I NEED TO KNOW ABOUT THIS EATING PLAN? For the low-sodium eating plan, you will follow these general guidelines: 7. Choose foods with a % Daily Value for sodium of less than 5% (as listed on the food label).  8. Use salt-free seasonings or herbs instead of table salt or sea salt.  9. Check with your health care provider or pharmacist before using salt substitutes.  10. Eat fresh foods. 11. Eat more vegetables and fruits. 12. Limit canned vegetables. If you do use them, rinse them well to decrease the sodium.  13. Limit cheese to 1 oz (28 g) per day.  14. Eat lower-sodium products, often labeled as "lower sodium" or "no salt added." 15. Avoid foods that contain monosodium glutamate (MSG). MSG is sometimes added to [place] food and some canned foods. 16. Check food labels (Nutrition Facts labels) on foods to learn how much sodium is in one serving. 34. Eat more home-cooked food and less restaurant, buffet, and fast food. 18. When eating at a restaurant, ask that your food be prepared with less salt, or no salt if possible.  HOW DO I READ FOOD LABELS FOR SODIUM INFORMATION? The Nutrition Facts label lists the amount of sodium in one serving of the food. If you eat more than one serving,  you must multiply the listed amount of sodium by the number of servings. Food labels may also identify foods as: 6. Sodium free--Less than 5 mg in a serving. 7. Very low sodium--35 mg or less in a serving. 8. Low sodium--140 mg or less in a serving. 9. Light in sodium--50% less sodium in a serving. For example, if a food that usually has 300 mg of sodium is changed to become light in sodium, it will have 150 mg of sodium. 10. Reduced sodium--25% less sodium in a serving. For example, if a food that usually has 400 mg of sodium is changed to reduced sodium, it will have 300 mg of sodium. WHAT FOODS CAN I EAT? Grains Low-sodium cereals, including oats, puffed wheat and rice, and shredded wheat cereals. Low-sodium crackers. Unsalted rice and pasta. Lower-sodium bread.  Vegetables Frozen or fresh vegetables. Low-sodium or reduced-sodium canned vegetables.  Low-sodium or reduced-sodium tomato sauce and paste. Low-sodium or reduced-sodium tomato and vegetable juices.  Fruits Fresh, frozen, and canned fruit. Fruit juice.  Meat and Other Protein Products Low-sodium canned tuna and salmon. Fresh or frozen meat, poultry, seafood, and fish. Lamb. Unsalted nuts. Dried beans, peas, and lentils without added salt. Unsalted canned beans. Homemade soups without salt. Eggs.  Dairy Milk. Soy milk. Ricotta cheese. Low-sodium or reduced-sodium cheeses. Yogurt.  Condiments Fresh and dried herbs and spices. Salt-free seasonings. Onion and garlic powders. Low-sodium varieties of mustard and ketchup. Fresh or refrigerated horseradish. Lemon juice.  Fats and Oils Reduced-sodium salad dressings. Unsalted butter.  Other Unsalted popcorn and pretzels.  The items listed above may not be a complete list of recommended foods or beverages. Contact your dietitian for more options. WHAT FOODS ARE NOT RECOMMENDED? Grains Instant hot cereals. Bread stuffing, pancake, and biscuit mixes. Croutons. Seasoned rice or  pasta mixes. Noodle soup cups. Boxed or frozen macaroni and cheese. Self-rising flour. Regular salted crackers. Vegetables Regular canned vegetables. Regular canned tomato sauce and paste. Regular tomato and vegetable juices. Frozen vegetables in sauces. Salted [place] fries. Olives. Angie Fava. Relishes. Sauerkraut. Salsa. Meat and Other Protein Products Salted, canned, smoked, spiced, or pickled meats, seafood, or fish. Bacon, ham, sausage, hot dogs, corned beef, chipped beef, and packaged luncheon meats. Salt pork. Jerky. Pickled herring. Anchovies, regular canned tuna, and sardines. Salted nuts. Dairy Processed cheese and cheese spreads. Cheese curds. Blue cheese and cottage cheese. Buttermilk.  Condiments Onion and garlic salt, seasoned salt, table salt, and sea salt. Canned and packaged gravies. Worcestershire sauce. Tartar sauce. Barbecue sauce. Teriyaki sauce. Soy sauce, including reduced sodium. Steak sauce. Fish sauce. Oyster sauce. Cocktail sauce. Horseradish that you find on the shelf. Regular ketchup and mustard. Meat flavorings and tenderizers. Bouillon cubes. Hot sauce. Tabasco sauce. Marinades. Taco seasonings. Relishes. Fats and Oils Regular salad dressings. Salted butter. Margarine. Ghee. Bacon fat.  Other Potato and tortilla chips. Corn chips and puffs. Salted popcorn and pretzels. Canned or dried soups. Pizza. Frozen entrees and pot pies.  The items listed above may not be a complete list of foods and beverages to avoid. Contact your dietitian for more information.   This information is not intended to replace advice given to you by your health care provider. Make sure you discuss any questions you have with your health care provider.   Document Released: 10/07/2001 Document Revised: 05/08/2014 Document Reviewed: 02/19/2013 Elsevier Interactive Patient Education 2016 [place].  Pneumococcal Vaccine, Polyvalent suspension for injection What is this  medicine? PNEUMOCOCCAL VACCINE (NEU mo KOK al vak SEEN) is a vaccine used to prevent pneumococcus bacterial infections. These bacteria can cause serious infections like pneumonia, meningitis, and Hinton infections. This vaccine will lower your chance of getting pneumonia. If you do get pneumonia, it can make your symptoms milder and your illness shorter. This vaccine will not treat an infection and will not cause infection. This vaccine is recommended for infants and young children, adults with certain medical conditions, and adults 88 years or older. This medicine may be used for other purposes; ask your health care provider or pharmacist if you have questions. What should I tell my health care provider before I take this medicine? They need to know if you have any of these conditions: -bleeding problems -fever -immune system problems -an unusual or allergic reaction to pneumococcal vaccine, diphtheria toxoid, other vaccines, latex, other medicines, foods, dyes, or preservatives -pregnant or trying to get pregnant -breast-feeding How should I  use this medicine? This vaccine is for injection into a muscle. It is given by a health care professional. A copy of Vaccine Information Statements will be given before each vaccination. Read this sheet carefully each time. The sheet may change frequently. Talk to your pediatrician regarding the use of this medicine in children. While this drug may be prescribed for children as young as 87 weeks old for selected conditions, precautions do apply. Overdosage: If you think you have taken too much of this medicine contact a poison control center or emergency room at once. NOTE: This medicine is only for you. Do not share this medicine with others. What if I miss a dose? It is important not to miss your dose. Call your doctor or health care professional if you are unable to keep an appointment. What may interact with this medicine? -medicines for cancer  chemotherapy -medicines that suppress your immune function -steroid medicines like prednisone or cortisone This list may not describe all possible interactions. Give your health care provider a list of all the medicines, herbs, non-prescription drugs, or dietary supplements you use. Also tell them if you smoke, drink alcohol, or use illegal drugs. Some items may interact with your medicine. What should I watch for while using this medicine? Mild fever and pain should go away in 3 days or less. Report any unusual symptoms to your doctor or health care professional. What side effects may I notice from receiving this medicine? Side effects that you should report to your doctor or health care professional as soon as possible: -allergic reactions like skin rash, itching or hives, swelling of the face, lips, or tongue -breathing problems -confused -fast or irregular heartbeat -fever over 102 degrees F -seizures -unusual bleeding or bruising -unusual muscle weakness Side effects that usually do not require medical attention (report to your doctor or health care professional if they continue or are bothersome): -aches and pains -diarrhea -fever of 102 degrees F or less -headache -irritable -loss of appetite -pain, tender at site where injected -trouble sleeping This list may not describe all possible side effects. Call your doctor for medical advice about side effects. You may report side effects to FDA at 1-800-FDA-1088. Where should I keep my medicine? This does not apply. This vaccine is given in a clinic, pharmacy, doctor's office, or other health care setting and will not be stored at home. NOTE: This sheet is a summary. It may not cover all possible information. If you have questions about this medicine, talk to your doctor, pharmacist, or health care provider.    2016, Elsevier/Gold Standard. (2014-01-22 10:27:27)      By signing my name below, I, Henry Hinton, attest that this  documentation has been prepared under the direction and in the presence of Henry Ray, MD. Electronically Signed: Moises Hinton, Thomas. 02/15/2015 , 3:19 PM .  I personally performed the services described in this documentation, which was scribed in my presence. The recorded information has been reviewed and considered, and addended by me as needed.

## 2015-02-16 LAB — PSA: PSA: 0.38 ng/mL (ref <=4.00)

## 2015-02-20 ENCOUNTER — Other Ambulatory Visit: Payer: Self-pay | Admitting: Family Medicine

## 2015-03-10 ENCOUNTER — Encounter: Payer: Self-pay | Admitting: Family Medicine

## 2015-03-10 DIAGNOSIS — Z1211 Encounter for screening for malignant neoplasm of colon: Secondary | ICD-10-CM

## 2015-03-18 NOTE — Telephone Encounter (Signed)
Will refer for colonoscopy

## 2015-03-19 ENCOUNTER — Encounter: Payer: Self-pay | Admitting: Internal Medicine

## 2015-03-27 ENCOUNTER — Other Ambulatory Visit: Payer: Self-pay | Admitting: Family Medicine

## 2015-04-26 ENCOUNTER — Encounter (HOSPITAL_COMMUNITY): Payer: Self-pay

## 2015-04-26 ENCOUNTER — Emergency Department (HOSPITAL_COMMUNITY): Payer: BC Managed Care – PPO

## 2015-04-26 ENCOUNTER — Emergency Department (HOSPITAL_COMMUNITY)
Admission: EM | Admit: 2015-04-26 | Discharge: 2015-04-26 | Disposition: A | Discharge status: Home / Self Care | Payer: BC Managed Care – PPO | Attending: Emergency Medicine | Admitting: Emergency Medicine

## 2015-04-26 ENCOUNTER — Other Ambulatory Visit: Payer: Self-pay | Admitting: Family Medicine

## 2015-04-26 DIAGNOSIS — Z87442 Personal history of urinary calculi: Secondary | ICD-10-CM | POA: Diagnosis not present

## 2015-04-26 DIAGNOSIS — M1990 Unspecified osteoarthritis, unspecified site: Secondary | ICD-10-CM | POA: Diagnosis not present

## 2015-04-26 DIAGNOSIS — R1084 Generalized abdominal pain: Secondary | ICD-10-CM | POA: Insufficient documentation

## 2015-04-26 DIAGNOSIS — Z79899 Other long term (current) drug therapy: Secondary | ICD-10-CM | POA: Diagnosis not present

## 2015-04-26 DIAGNOSIS — Z859 Personal history of malignant neoplasm, unspecified: Secondary | ICD-10-CM | POA: Insufficient documentation

## 2015-04-26 DIAGNOSIS — D649 Anemia, unspecified: Secondary | ICD-10-CM | POA: Insufficient documentation

## 2015-04-26 DIAGNOSIS — Z8701 Personal history of pneumonia (recurrent): Secondary | ICD-10-CM | POA: Diagnosis not present

## 2015-04-26 DIAGNOSIS — R109 Unspecified abdominal pain: Secondary | ICD-10-CM

## 2015-04-26 DIAGNOSIS — R011 Cardiac murmur, unspecified: Secondary | ICD-10-CM | POA: Insufficient documentation

## 2015-04-26 DIAGNOSIS — R110 Nausea: Secondary | ICD-10-CM | POA: Diagnosis not present

## 2015-04-26 LAB — BASIC METABOLIC PANEL
Anion gap: 8 (ref 5–15)
BUN: 13 mg/dL (ref 6–20)
CHLORIDE: 108 mmol/L (ref 101–111)
CO2: 24 mmol/L (ref 22–32)
Calcium: 8.2 mg/dL — ABNORMAL LOW (ref 8.9–10.3)
Creatinine, Ser: 0.71 mg/dL (ref 0.61–1.24)
GFR calc Af Amer: >60 mL/min (ref >60)
GFR calc non Af Amer: >60 mL/min (ref >60)
GLUCOSE: 141 mg/dL — AB (ref 65–99)
POTASSIUM: 4.4 mmol/L (ref 3.5–5.1)
Sodium: 140 mmol/L (ref 135–145)

## 2015-04-26 LAB — URINALYSIS, ROUTINE W REFLEX MICROSCOPIC
Bilirubin Urine: NEGATIVE
Glucose, UA: NEGATIVE mg/dL
Hgb urine dipstick: NEGATIVE
Ketones, ur: NEGATIVE mg/dL
LEUKOCYTES UA: NEGATIVE
Nitrite: NEGATIVE
PH: 6 (ref 5.0–8.0)
Protein, ur: NEGATIVE mg/dL
Specific Gravity, Urine: 1.02 (ref 1.005–1.030)

## 2015-04-26 LAB — CBC WITH DIFFERENTIAL/PLATELET
Basophils Absolute: 0 10*3/uL (ref 0.0–0.1)
Basophils Relative: 0 %
Eosinophils Absolute: 0.3 10*3/uL (ref 0.0–0.7)
Eosinophils Relative: 1 %
HEMATOCRIT: 38.5 % — AB (ref 39.0–52.0)
HEMOGLOBIN: 12 g/dL — AB (ref 13.0–17.0)
LYMPHS ABS: 20.1 10*3/uL — AB (ref 0.7–4.0)
LYMPHS PCT: 81 %
MCH: 27 pg (ref 26.0–34.0)
MCHC: 31.2 g/dL (ref 30.0–36.0)
MCV: 86.5 fL (ref 78.0–100.0)
MONOS PCT: 2 %
Monocytes Absolute: 0.5 10*3/uL (ref 0.1–1.0)
Neutro Abs: 4 10*3/uL (ref 1.7–7.7)
Neutrophils Relative %: 16 %
Platelets: 155 10*3/uL (ref 150–400)
RBC: 4.45 MIL/uL (ref 4.22–5.81)
RDW: 16 % — ABNORMAL HIGH (ref 11.5–15.5)
WBC: 24.9 10*3/uL — ABNORMAL HIGH (ref 4.0–10.5)

## 2015-04-26 MED ORDER — HYDROCODONE-ACETAMINOPHEN 5-325 MG PO TABS: 1.0000 | ORAL_TABLET | Freq: Four times a day (QID) | ORAL | Status: DC | PRN

## 2015-04-26 MED ORDER — HYDROMORPHONE HCL 1 MG/ML IJ SOLN
1.0000 mg | Freq: Once | INTRAMUSCULAR | Status: AC
  Administered 2015-04-26: 1 mg via INTRAVENOUS
  Filled 2015-04-26: qty 1

## 2015-04-26 MED ORDER — ONDANSETRON HCL 4 MG/2ML IJ SOLN
4.0000 mg | Freq: Once | INTRAMUSCULAR | Status: AC
  Administered 2015-04-26: 4 mg via INTRAVENOUS
  Filled 2015-04-26: qty 2

## 2015-04-26 NOTE — ED Notes (Addendum)
Pt given urinal, attempting to give UA. Informed that this NT and Jarrett Soho, RN will have to in & out cath if not successful.

## 2015-04-26 NOTE — ED Provider Notes (Signed)
CSN: [id number]     Arrival date & time 04/26/15  4196 History   First MD Initiated Contact with Patient 04/26/15 0735     Chief Complaint  Patient presents with  . Flank Pain     (Consider location/radiation/quality/duration/timing/severity/associated sxs/prior Treatment) HPI Comments: Has had some constipation the last 3 days but had a normal bm this morning. He states that he has history of stone and a sigmoid hernia. Denies fever.  Patient is a 54 y.o. male presenting with flank pain. The history is provided by the patient. No language interpreter was used.  Flank Pain This is a recurrent problem. The current episode started today. The problem occurs constantly. The problem has been unchanged. Associated symptoms include abdominal pain and nausea. Pertinent negatives include no congestion, fever or vomiting. Nothing aggravates the symptoms. He has tried nothing for the symptoms.    Past Medical History  Diagnosis Date  . Family history of anesthesia complication     " father having delirium after anesthesia"  . Arthritis   . Numbness and tingling     Hx: of in right arm  . Heart murmur     Hx; of as a child  . Joint pain     Hx: of periodically  . Sleep apnea   . Pneumonia   . Kidney stones     Hx; of as a child  . Anemia   . 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 Lumbar laminectomy      Hx: of 2005  . Joint replacement    . Total hip arthroplasty      Hx: of B/L hips  . Tenosynovitis      Hx: of thumb  . Tonsillectomy    . Colonoscopy      Hx: of  . Posterior cervical laminectomy with met- rx Right 01/21/2013    Procedure: Right Sitting C6-7 Microdiskectomy with Met-rex;  Surgeon: Kristeen Miss, MD;  Location: [place] [facility]URO ORS;  Service: Neurosurgery;  Laterality: Right;  Right Sitting C6-7 Microdiskectomy with Met-rex  . Spine surgery    . Knee surgery Left 07/21/2014    Dr. Noemi Chapel  . Stent placement rt ureter ([facility]  hx)  10/23/2014   Family History  Problem Relation Age of Onset  . COPD Maternal Grandmother   . Heart disease Maternal Grandfather   . Cancer Paternal Grandfather     LUNG  . Diabetes Father   . Cancer Father     PROSTATE   Social History  Substance Use Topics  . Smoking status: Never Smoker   . Smokeless tobacco: Former Systems developer    Types: [place] date: 05/01/1993  . Alcohol Use: 0.6 oz/week    1 Standard drinks or equivalent per week     Comment: BEER AND LIQUOR    Review of Systems  Constitutional: Negative for fever.  HENT: Negative for congestion.   Gastrointestinal: Positive for nausea and abdominal pain. Negative for vomiting.  Genitourinary: Positive for flank pain.  All other systems reviewed and are negative.     Allergies  Adhesive  Home Medications   Prior to Admission medications   Medication Sig Start Date End Date Taking? Authorizing Provider  docusate sodium (COLACE) 100 MG capsule Take 100 mg by mouth 2 (two) times daily.     Historical Provider, MD  ferrous sulfate (IRON SUPPLEMENT) 325 (65 FE) MG tablet Take 325 mg by  mouth daily with breakfast.    Historical Provider, MD  furosemide (LASIX) 40 MG tablet Take 1 tablet (40 mg total) by mouth daily. 02/15/15   [name], MD  Multiple Vitamins-Minerals (MENS 50+ MULTI VITAMIN/MIN) TABS Take 1 tablet by mouth daily.     Historical Provider, MD  potassium chloride (KLOR-CON 10) 10 MEQ tablet TAKE 1 TABLET (10 MEQ TOTAL) BY MOUTH DAILY. 02/15/15   [name], MD  XARELTO 20 MG TABS tablet TAKE 1 TABLET (20 MG TOTAL) BY MOUTH DAILY WITH SUPPER. 01/18/15   [name], MD   BP 132/65 mmHg  Pulse 67  Temp(Src) 98.2 F (36.8 C) (Oral)  Resp 16  Ht '5\' 9"'  (1.753 m)  Wt 174.635 kg  BMI 56.83 kg/m2  SpO2 97% Physical Exam  Constitutional: He is oriented to person, place, and time. He appears well-developed and well-nourished.  Cardiovascular: Normal rate and regular rhythm.    Pulmonary/Chest: Effort normal and breath sounds normal.  Abdominal: Soft. Bowel sounds are normal.  Generalized tenderness to the lower abdomne  Genitourinary: Penis normal.  Musculoskeletal: Normal range of motion.  Neurological: He is oriented to person, place, and time. Coordination normal.  Skin: Skin is warm and dry.  Psychiatric: He has a normal mood and affect.  Nursing note and vitals reviewed.   ED Course  Procedures (including critical care time) Labs Review Labs Reviewed  BASIC METABOLIC PANEL - Abnormal; Notable for the following:    Glucose, Bld 141 (*)    Calcium 8.2 (*)    All other components within normal limits  CBC WITH DIFFERENTIAL/PLATELET - Abnormal; Notable for the following:    WBC 24.9 (*)    Hemoglobin 12.0 (*)    HCT 38.5 (*)    RDW 16.0 (*)    Lymphs Abs 20.1 (*)    All other components within normal limits  URINALYSIS, ROUTINE W REFLEX MICROSCOPIC (NOT AT [facility])    Imaging Review Ct Abdomen Pelvis Wo Contrast  04/26/2015  CLINICAL DATA:  Right flank pain since Friday EXAM: CT ABDOMEN AND PELVIS WITHOUT CONTRAST TECHNIQUE: Multidetector CT imaging of the abdomen and pelvis was performed following the standard protocol without IV contrast. COMPARISON:  October 26, 2014 FINDINGS: The liver, spleen, pancreas, gallbladder, adrenal glands are normal. There is atherosclerosis of the abdominal aorta without aneurysmal dilatation. There is no abdominal lymphadenopathy. There is small umbilical herniation of mesenteric fat. There is nephrolithiasis of bilateral kidneys with largest stone measuring approximately 1-2 mm. There is no hydronephrosis bilaterally. There is no small bowel obstruction or diverticulitis. The appendix is normal. Evaluation of bladder is limited due to metallic artifact from bilateral hip replacements. Stable soft tissue is identified in the right inguinal canal unchanged. The lung bases are clear. Degenerative joint changes of the spine are  noted. IMPRESSION: Bilateral nephrolithiasis without hydronephrosis. No bowel obstruction.  Normal appendix. Electronically Signed   By: Abelardo Diesel M.D.   On: 04/26/2015 08:42   I have personally reviewed and evaluated these images and lab results as part of my medical decision-making.   EKG Interpretation None      MDM   Final diagnoses:  Flank pain    Pt is more comfortable at this time. No acute process noted. Will send pt home with hydrocodone for pain. Discussed return precautions. Labs consistent with previous visit    Glendell Docker, NP 04/26/15 [place], MD 04/26/15 [phone]

## 2015-04-26 NOTE — ED Notes (Signed)
Pt returned from CT °

## 2015-04-26 NOTE — ED Notes (Signed)
Per Pt, Pt has HX of Sepsis, Kidney Stone with Stent placement, and Hernia of the sigmoid colon. 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 Pt reports right sided burning flank pain that has been worsening for three days. Pt reports constipation with bowel movement finally yesterday. Pt reports nausea with no vomiting.

## 2015-04-26 NOTE — ED Notes (Signed)
Phlebotomy at the bedside  

## 2015-04-26 NOTE — ED Notes (Signed)
NP at the bedside

## 2015-04-26 NOTE — Discharge Instructions (Signed)
Flank Pain °Flank pain refers to pain that is located on the side of the body between the upper abdomen and the back. The pain may occur over a short period of time (acute) or may be long-term or reoccurring (chronic). It may be mild or severe. Flank pain can be caused by many things. °CAUSES  °Some of the more common causes of flank pain include: °· Muscle strains.   °· Muscle spasms.   °· A disease of your spine (vertebral disk disease).   °· A lung infection (pneumonia).   °· Fluid around your lungs (pulmonary edema).   °· A kidney infection.   °· Kidney stones.   °· A very painful skin rash caused by the chickenpox virus (shingles).   °· Gallbladder disease.   °HOME CARE INSTRUCTIONS  °Home care will depend on the cause of your pain. In general, °· Rest as directed by your caregiver. °· Drink enough fluids to keep your urine clear or pale yellow. °· Only take over-the-counter or prescription medicines as directed by your caregiver. Some medicines may help relieve the pain. °· Tell your caregiver about any changes in your pain. °· Follow up with your caregiver as directed. °SEEK IMMEDIATE MEDICAL CARE IF:  °· Your pain is not controlled with medicine.   °· You have new or worsening symptoms. °· Your pain increases.   °· You have abdominal pain.   °· You have shortness of breath.   °· You have persistent nausea or vomiting.   °· You have swelling in your abdomen.   °· You feel faint or pass out.   °· You have blood in your urine. °· You have a fever or persistent symptoms for more than 2-3 days. °· You have a fever and your symptoms suddenly get worse. °MAKE SURE YOU:  °· Understand these instructions. °· Will watch your condition. °· Will get help right away if you are not doing well or get worse. °  °This information is not intended to replace advice given to you by your health care provider. Make sure you discuss any questions you have with your health care provider. °  °Document Released: 06/08/2005 Document  Revised: 01/10/2012 Document Reviewed: 11/30/2011 °Elsevier Interactive Patient Education ©2016 Elsevier Inc. ° °

## 2015-04-26 NOTE — ED Notes (Signed)
NP [place] at the bedside

## 2015-04-28 ENCOUNTER — Encounter: Payer: Self-pay | Admitting: Family Medicine

## 2015-04-29 NOTE — Telephone Encounter (Signed)
Plan on 6 month treatment form June. Planning on plane trips next few weeks. He would like to continue one more month, then stop.  Refilled one month.

## 2015-04-29 NOTE — Telephone Encounter (Signed)
Dr Carlota Raspberry, it looks like pt sent you an email about whether he should continue this medication. I will send this to you for review.

## 2015-05-09 ENCOUNTER — Encounter: Payer: Self-pay | Admitting: Family Medicine

## 2015-05-24 ENCOUNTER — Telehealth: Payer: Self-pay

## 2015-05-24 ENCOUNTER — Encounter: Payer: Self-pay | Admitting: Internal Medicine

## 2015-05-24 ENCOUNTER — Ambulatory Visit (INDEPENDENT_AMBULATORY_CARE_PROVIDER_SITE_OTHER): Payer: BC Managed Care – PPO | Admitting: Internal Medicine

## 2015-05-24 ENCOUNTER — Encounter: Payer: Self-pay | Admitting: Family Medicine

## 2015-05-24 VITALS — BP 126/76 | HR 76 | Ht 69.0 in | Wt >= 6400 oz

## 2015-05-24 DIAGNOSIS — Z1211 Encounter for screening for malignant neoplasm of colon: Secondary | ICD-10-CM | POA: Diagnosis not present

## 2015-05-24 DIAGNOSIS — K4001 Bilateral inguinal hernia, with obstruction, without gangrene, recurrent: Secondary | ICD-10-CM

## 2015-05-24 NOTE — Progress Notes (Signed)
HISTORY OF PRESENT ILLNESS:  Henry Hinton is a 55 y.o. male with multiple significant medical problems including morbid obesity with a BMI of almost 60, CLL, symptomatic kidney stones requiring intervention, and history of DVT for which he recently completed Xarelto therapy. Patient is sent today by his primary care provider Henry Hinton regarding routine screening colonoscopy. Patient tells me that he was hospitalized in [place] in 2004 with cellulitis. He was found to be anemic. Thus, he underwent colonoscopy which he reports as unremarkable. He has no lower GI complaints except for inguinal pain on the right secondary to known inguinal hernia. Review of outside CT scan also shows large inguinal hernia on the left with sigmoid colon present without obstruction in June. Patient understands that he needs to see a general surgeon regarding this. Outside blood work reviewed. Most recent hemoglobin 12.0 04/26/2015. No family history of colon cancer  REVIEW OF SYSTEMS:  All non-GI ROS negative except for fatigue, muscle cramps, extremity edema  Past Medical History  Diagnosis Date  . Family history of anesthesia complication     " father having delirium after anesthesia"  . Arthritis   . Numbness and tingling     Hx: of in right arm  . Heart murmur     Hx; of as a child  . Joint pain     Hx: of periodically  . Sleep apnea   . Pneumonia   . Kidney stones     Hx; of as a child  . Anemia   . 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 Lumbar laminectomy      Hx: of 2005  . Joint replacement    . Total hip arthroplasty      Hx: of B/L hips  . Tenosynovitis      Hx: of thumb  . Tonsillectomy    . Colonoscopy      Hx: of  . Posterior cervical laminectomy with met- rx Right 01/21/2013    Procedure: Right Sitting C6-7 Microdiskectomy with Met-rex;  Surgeon: Henry Miss, MD;  Location: [place] [facility] OR[facility];  Service: Neurosurgery;  Laterality: Right;   Right Sitting C6-7 Microdiskectomy with Met-rex  . Spine surgery    . Knee surgery Left 07/21/2014    Dr. Noemi Chapel  . Stent placement rt ureter ([facility] hx)  10/23/2014    Social History Henry Hinton  reports that he has never smoked. He quit smokeless tobacco use about 22 years ago. His smokeless tobacco use included Chew. He reports that he drinks about 0.6 oz of alcohol per week. He reports that he does not use illicit drugs.  family history includes COPD in his maternal grandmother; Diabetes in his father; Heart disease in his maternal grandfather; Lung cancer in his paternal grandfather; Prostate cancer in his father.  Allergies  Allergen Reactions  . Adhesive [Tape] Other (See Comments)    Blisters and rash at site       PHYSICAL EXAMINATION: Vital signs: BP 126/76 mmHg  Pulse 76  Ht '5\' 9"'  (1.753 m)  Wt 401 lb 8 oz (182.119 kg)  BMI 59.26 kg/m2  Constitutional: Markedly obese, unhealthy appearing, no acute distress Psychiatric: alert and oriented x3, cooperative Eyes: extraocular movements intact, anicteric, conjunctiva pink Mouth: oral pharynx moist, no lesions Neck: Thick, supple no lymphadenopathy Cardiovascular: heart regular rate and rhythm, no murmur Lungs: clear to auscultation bilaterally Abdomen: soft, markedly obese, nontender, nondistended, no obvious  ascites, no peritoneal signs, normal bowel sounds, no organomegaly. Umbilical hernia Groin: Tender right inguinal hernia. Difficult to examine due to obesity Rectal: Omitted Extremities: no clubbing or cyanosis. Chronic stasis changes with woody edema bilaterally Skin: no lesions on visible extremities Neuro: No focal deficits. No asterixis.   ASSESSMENT:  #1. Colon cancer screening. Patient is not appropriate for colonoscopy given sigmoid colon in left groin. Also, extremely high risk for sedation related complications given his body habitus. We discussed other strategies #2. Symptomatic inguinal hernias #3.  Morbid obesity  PLAN:  #1. Recommended cologard as a screening tool for colorectal cancer in this patient #2. Cologard positive would need to have hernias repaired in order to proceed with colonoscopy #3. Instructed to see general surgeon regarding hernia repair #4. Discussed importance of weight loss #5. Return to Henry Hinton for general medical care   A copy of this consultation has been sent to Henry Hinton

## 2015-05-24 NOTE — Telephone Encounter (Signed)
Left message for pt to call back  °

## 2015-05-24 NOTE — Telephone Encounter (Signed)
Pt is wanting to talk with dr Carlota Raspberry about clearance for surgery and that he has stopped his zarelto Saturday because of the pain and having a feeling he would need emergency surgery  Best number [phone]

## 2015-05-24 NOTE — Patient Instructions (Signed)
You will receive a call from [employer] to help you start the Cologuard process

## 2015-05-25 NOTE — Telephone Encounter (Signed)
My chart email sent

## 2015-05-26 ENCOUNTER — Telehealth: Payer: Self-pay

## 2015-05-26 DIAGNOSIS — C9110 Chronic lymphocytic leukemia of B-cell type not having achieved remission: Secondary | ICD-10-CM

## 2015-05-26 NOTE — Telephone Encounter (Signed)
Sharyn Lull from [facility] is needing to have dr Carlota Raspberry document in the chart that patient is cleared for surgery and ok to stop his xarelto what is in the chart needs to be modified   Please call [phone]

## 2015-05-27 ENCOUNTER — Other Ambulatory Visit: Payer: Self-pay | Admitting: General Surgery

## 2015-05-27 DIAGNOSIS — C9110 Chronic lymphocytic leukemia of B-cell type not having achieved remission: Secondary | ICD-10-CM | POA: Insufficient documentation

## 2015-05-27 NOTE — Telephone Encounter (Signed)
Per surgeon's office - planning on L inguinal hernia repair next week.  Needs note documented in chart cleared for surgery and if ok to discontinue Xarelto for surgery.  Of note, talking to patient he has been having problems with the sides and was under the impression he was getting a bilateral hernia repair. Advised him to call [facility] to discuss this and make sure everybody is on the same page.  History of preop evaluation in July 2016. At that time -no known hx of cardiac disease or reaction to anesthesia. Stress echo in 1/15 with EF 65% and low risk study, no signs of ischemia. Has been able to swim with exercise without chest pain or dyspnea.  He denies any changes in health recently, and continues to deny any chest pain or shortness of breath with exercise.   Diagnosed with CLL and followed by Dr. Lelon Huh at [facility] (Stable WBC of 24.9 one month ago), without apparent restrictions.   Hx of DVT with plan of Xarelto for 6 month duration  from June 28th,2016.  stopped 6 days ago, so should be able to remain off Xarelto at this point. Would recommend perioperative/postoperative DVT prophylaxis and cautioned on signs/sx's of DVT recurrence.    Did note his most recent BMP and normal renal function (0.71 on 04/26/15), but elevated glucose. Can follow up outpatient for evaluation for prediabetes/diabetes with obesity, but recommend perioperative glycemic measurement.

## 2015-05-28 ENCOUNTER — Encounter (HOSPITAL_COMMUNITY): Payer: Self-pay | Admitting: *Deleted

## 2015-05-28 NOTE — Progress Notes (Signed)
05/24/2015-noted office visit in [place] with labs from Dr. [name]. 06/09/2013-Bilateral Carotid Study Report from [facility] on chart.Marland Kitchen 06/12/2013-2 D Echo from [facility] on chart. 05/19/2013-EKG from [facility] on chart. 05/26/2013-HgA1C results on chart from [facility]. 05/26/2013-Stress test from [facility] Cardiology on chart. 06/25/2013-office visit from Dr. Einar Gip on chart.

## 2015-05-28 NOTE — Telephone Encounter (Signed)
Called [facility] and advised clearance is in the office note.

## 2015-05-31 MED ORDER — DEXTROSE 5 % IV SOLN
3.0000 g | INTRAVENOUS | Status: AC
  Administered 2015-06-01: 3 g via INTRAVENOUS
  Filled 2015-05-31 (×2): qty 3000

## 2015-06-01 ENCOUNTER — Ambulatory Visit (HOSPITAL_COMMUNITY): Payer: BC Managed Care – PPO | Admitting: Anesthesiology

## 2015-06-01 ENCOUNTER — Encounter (HOSPITAL_COMMUNITY)
Admission: RE | Disposition: A | Discharge status: Home / Self Care | Payer: Self-pay | Source: Ambulatory Visit | Attending: General Surgery

## 2015-06-01 ENCOUNTER — Ambulatory Visit (HOSPITAL_COMMUNITY)
Admission: RE | Admit: 2015-06-01 | Discharge: 2015-06-01 | Disposition: A | Discharge status: Home / Self Care | Payer: BC Managed Care – PPO | Source: Ambulatory Visit | Attending: General Surgery | Admitting: General Surgery

## 2015-06-01 ENCOUNTER — Encounter (HOSPITAL_COMMUNITY): Payer: Self-pay | Admitting: *Deleted

## 2015-06-01 DIAGNOSIS — G4730 Sleep apnea, unspecified: Secondary | ICD-10-CM | POA: Insufficient documentation

## 2015-06-01 DIAGNOSIS — Z79899 Other long term (current) drug therapy: Secondary | ICD-10-CM | POA: Insufficient documentation

## 2015-06-01 DIAGNOSIS — Z86711 Personal history of pulmonary embolism: Secondary | ICD-10-CM | POA: Diagnosis not present

## 2015-06-01 DIAGNOSIS — M1990 Unspecified osteoarthritis, unspecified site: Secondary | ICD-10-CM | POA: Insufficient documentation

## 2015-06-01 DIAGNOSIS — Z86718 Personal history of other venous thrombosis and embolism: Secondary | ICD-10-CM | POA: Insufficient documentation

## 2015-06-01 DIAGNOSIS — Z6841 Body Mass Index (BMI) 40.0 and over, adult: Secondary | ICD-10-CM | POA: Insufficient documentation

## 2015-06-01 DIAGNOSIS — K4090 Unilateral inguinal hernia, without obstruction or gangrene, not specified as recurrent: Secondary | ICD-10-CM | POA: Diagnosis not present

## 2015-06-01 DIAGNOSIS — Z7901 Long term (current) use of anticoagulants: Secondary | ICD-10-CM | POA: Insufficient documentation

## 2015-06-01 HISTORY — PX: INSERTION OF MESH: SHX5868

## 2015-06-01 HISTORY — PX: INGUINAL HERNIA REPAIR: SHX194

## 2015-06-01 HISTORY — DX: Acute embolism and thrombosis of unspecified deep veins of unspecified lower extremity: I82.409

## 2015-06-01 LAB — BASIC METABOLIC PANEL
ANION GAP: 6 (ref 5–15)
BUN: 23 mg/dL — ABNORMAL HIGH (ref 6–20)
CHLORIDE: 109 mmol/L (ref 101–111)
CO2: 25 mmol/L (ref 22–32)
CREATININE: 0.76 mg/dL (ref 0.61–1.24)
Calcium: 8.4 mg/dL — ABNORMAL LOW (ref 8.9–10.3)
GFR calc non Af Amer: >60 mL/min (ref >60)
Glucose, Bld: 148 mg/dL — ABNORMAL HIGH (ref 65–99)
POTASSIUM: 4.8 mmol/L (ref 3.5–5.1)
Sodium: 140 mmol/L (ref 135–145)

## 2015-06-01 LAB — CBC
HEMATOCRIT: 39.6 % (ref 39.0–52.0)
HEMOGLOBIN: 12.5 g/dL — AB (ref 13.0–17.0)
MCH: 27.5 pg (ref 26.0–34.0)
MCHC: 31.6 g/dL (ref 30.0–36.0)
MCV: 87.2 fL (ref 78.0–100.0)
Platelets: 174 10*3/uL (ref 150–400)
RBC: 4.54 MIL/uL (ref 4.22–5.81)
RDW: 15.6 % — ABNORMAL HIGH (ref 11.5–15.5)
WBC: 28 10*3/uL — AB (ref 4.0–10.5)

## 2015-06-01 SURGERY — REPAIR, HERNIA, INGUINAL, ADULT
Anesthesia: General | Laterality: Left

## 2015-06-01 MED ORDER — HYDROMORPHONE HCL 2 MG/ML IJ SOLN
INTRAMUSCULAR | Status: AC
  Filled 2015-06-01: qty 1

## 2015-06-01 MED ORDER — SODIUM CHLORIDE 0.9 % IJ SOLN
INTRAMUSCULAR | Status: AC
Start: 2015-06-01 — End: 2015-06-01
  Filled 2015-06-01: qty 10

## 2015-06-01 MED ORDER — ONDANSETRON HCL 4 MG/2ML IJ SOLN: 4.0000 mg | Freq: Once | INTRAMUSCULAR | Status: DC | PRN

## 2015-06-01 MED ORDER — BUPIVACAINE-EPINEPHRINE 0.25% -1:200000 IJ SOLN
INTRAMUSCULAR | Status: DC | PRN
  Administered 2015-06-01: 30 mL

## 2015-06-01 MED ORDER — SUGAMMADEX SODIUM 200 MG/2ML IV SOLN
INTRAVENOUS | Status: DC | PRN
  Administered 2015-06-01: 400 mg via INTRAVENOUS

## 2015-06-01 MED ORDER — LACTATED RINGERS IV SOLN
INTRAVENOUS | Status: DC | PRN
  Administered 2015-06-01 (×2): via INTRAVENOUS

## 2015-06-01 MED ORDER — PROPOFOL 10 MG/ML IV BOLUS
INTRAVENOUS | Status: AC
  Filled 2015-06-01: qty 20

## 2015-06-01 MED ORDER — ROCURONIUM BROMIDE 100 MG/10ML IV SOLN
INTRAVENOUS | Status: AC
  Filled 2015-06-01: qty 1

## 2015-06-01 MED ORDER — HYDROMORPHONE HCL 1 MG/ML IJ SOLN: 0.2500 mg | INTRAMUSCULAR | Status: DC | PRN

## 2015-06-01 MED ORDER — LACTATED RINGERS IV SOLN
INTRAVENOUS | Status: DC
  Administered 2015-06-01 (×2): 1000 mL via INTRAVENOUS

## 2015-06-01 MED ORDER — MIDAZOLAM HCL 2 MG/2ML IJ SOLN
INTRAMUSCULAR | Status: AC
  Filled 2015-06-01: qty 2

## 2015-06-01 MED ORDER — PROPOFOL 10 MG/ML IV BOLUS
INTRAVENOUS | Status: AC
  Filled 2015-06-01: qty 40

## 2015-06-01 MED ORDER — ROCURONIUM BROMIDE 100 MG/10ML IV SOLN
INTRAVENOUS | Status: DC | PRN
  Administered 2015-06-01: 10 mg via INTRAVENOUS
  Administered 2015-06-01: 45 mg via INTRAVENOUS
  Administered 2015-06-01: 20 mg via INTRAVENOUS
  Administered 2015-06-01: 5 mg via INTRAVENOUS

## 2015-06-01 MED ORDER — FENTANYL CITRATE (PF) 100 MCG/2ML IJ SOLN: 25.0000 ug | INTRAMUSCULAR | Status: DC | PRN

## 2015-06-01 MED ORDER — OXYCODONE-ACETAMINOPHEN 5-325 MG PO TABS
1.0000 | ORAL_TABLET | ORAL | Status: DC | PRN
  Administered 2015-06-01 (×2): 1 via ORAL
  Filled 2015-06-01 (×2): qty 1

## 2015-06-01 MED ORDER — ONDANSETRON HCL 4 MG/2ML IJ SOLN
INTRAMUSCULAR | Status: AC
  Filled 2015-06-01: qty 2

## 2015-06-01 MED ORDER — SUFENTANIL CITRATE 50 MCG/ML IV SOLN
INTRAVENOUS | Status: DC | PRN
  Administered 2015-06-01 (×2): 10 ug via INTRAVENOUS
  Administered 2015-06-01 (×2): 5 ug via INTRAVENOUS
  Administered 2015-06-01 (×2): 10 ug via INTRAVENOUS

## 2015-06-01 MED ORDER — CHLORHEXIDINE GLUCONATE 4 % EX LIQD: 1.0000 "application " | Freq: Once | CUTANEOUS | Status: DC

## 2015-06-01 MED ORDER — DEXAMETHASONE SODIUM PHOSPHATE 10 MG/ML IJ SOLN
INTRAMUSCULAR | Status: AC
  Filled 2015-06-01: qty 1

## 2015-06-01 MED ORDER — ACETAMINOPHEN 10 MG/ML IV SOLN
INTRAVENOUS | Status: AC
  Filled 2015-06-01: qty 100

## 2015-06-01 MED ORDER — SUGAMMADEX SODIUM 500 MG/5ML IV SOLN
INTRAVENOUS | Status: AC
  Filled 2015-06-01: qty 5

## 2015-06-01 MED ORDER — LIDOCAINE HCL (CARDIAC) 20 MG/ML IV SOLN
INTRAVENOUS | Status: DC | PRN
  Administered 2015-06-01: 100 mg via INTRAVENOUS
  Administered 2015-06-01: 25 mg via INTRAVENOUS

## 2015-06-01 MED ORDER — MIDAZOLAM HCL 5 MG/5ML IJ SOLN
INTRAMUSCULAR | Status: DC | PRN
  Administered 2015-06-01: 2 mg via INTRAVENOUS

## 2015-06-01 MED ORDER — DEXAMETHASONE SODIUM PHOSPHATE 10 MG/ML IJ SOLN
INTRAMUSCULAR | Status: DC | PRN
  Administered 2015-06-01: 10 mg via INTRAVENOUS

## 2015-06-01 MED ORDER — 0.9 % SODIUM CHLORIDE (POUR BTL) OPTIME
TOPICAL | Status: DC | PRN
  Administered 2015-06-01: 1000 mL

## 2015-06-01 MED ORDER — OXYCODONE-ACETAMINOPHEN 5-325 MG PO TABS: 1.0000 | ORAL_TABLET | ORAL | Status: DC | PRN

## 2015-06-01 MED ORDER — LIDOCAINE HCL (CARDIAC) 20 MG/ML IV SOLN
INTRAVENOUS | Status: AC
  Filled 2015-06-01: qty 5

## 2015-06-01 MED ORDER — ACETAMINOPHEN 10 MG/ML IV SOLN
INTRAVENOUS | Status: DC | PRN
  Administered 2015-06-01: 1000 mg via INTRAVENOUS

## 2015-06-01 MED ORDER — BUPIVACAINE-EPINEPHRINE 0.5% -1:200000 IJ SOLN
INTRAMUSCULAR | Status: AC
Start: 2015-06-01 — End: 2015-06-01
  Filled 2015-06-01: qty 1

## 2015-06-01 MED ORDER — KETOROLAC TROMETHAMINE 30 MG/ML IJ SOLN
INTRAMUSCULAR | Status: AC
  Filled 2015-06-01: qty 1

## 2015-06-01 MED ORDER — SUCCINYLCHOLINE CHLORIDE 20 MG/ML IJ SOLN
INTRAMUSCULAR | Status: DC | PRN
  Administered 2015-06-01: 160 mg via INTRAVENOUS

## 2015-06-01 MED ORDER — ONDANSETRON HCL 4 MG/2ML IJ SOLN
INTRAMUSCULAR | Status: DC | PRN
  Administered 2015-06-01: 4 mg via INTRAVENOUS

## 2015-06-01 MED ORDER — PROPOFOL 10 MG/ML IV BOLUS
INTRAVENOUS | Status: DC | PRN
  Administered 2015-06-01: 50 mg via INTRAVENOUS
  Administered 2015-06-01: 300 mg via INTRAVENOUS

## 2015-06-01 MED ORDER — SUFENTANIL CITRATE 50 MCG/ML IV SOLN
INTRAVENOUS | Status: AC
  Filled 2015-06-01: qty 1

## 2015-06-01 MED ORDER — HYDROMORPHONE HCL 1 MG/ML IJ SOLN
INTRAMUSCULAR | Status: DC | PRN
  Administered 2015-06-01 (×2): 1 mg via INTRAVENOUS

## 2015-06-01 MED ORDER — PROPOFOL 500 MG/50ML IV EMUL
INTRAVENOUS | Status: DC | PRN
  Administered 2015-06-01: 100 ug/kg/min via INTRAVENOUS

## 2015-06-01 SURGICAL SUPPLY — 42 items
BENZOIN TINCTURE PRP APPL 2/3 (GAUZE/BANDAGES/DRESSINGS) IMPLANT
BLADE HEX COATED 2.75 (ELECTRODE) IMPLANT
BLADE SURG 15 STRL LF DISP TIS (BLADE) ×1 IMPLANT
BLADE SURG 15 STRL SS (BLADE) ×2
CLOSURE WOUND 1/2 X4 (GAUZE/BANDAGES/DRESSINGS)
COVER SURGICAL LIGHT HANDLE (MISCELLANEOUS) ×3 IMPLANT
DECANTER SPIKE VIAL GLASS SM (MISCELLANEOUS) ×3 IMPLANT
DRAIN PENROSE 18X1/2 LTX STRL (DRAIN) ×3 IMPLANT
DRAPE LAPAROSCOPIC ABDOMINAL (DRAPES) ×3 IMPLANT
ELECT PENCIL ROCKER SW 15FT (MISCELLANEOUS) ×3 IMPLANT
ELECT REM PT RETURN 9FT ADLT (ELECTROSURGICAL) ×3
ELECTRODE REM PT RTRN 9FT ADLT (ELECTROSURGICAL) ×1 IMPLANT
GAUZE SPONGE 4X4 12PLY STRL (GAUZE/BANDAGES/DRESSINGS) IMPLANT
GLOVE BIO SURGEON STRL SZ7.5 (GLOVE) ×6 IMPLANT
GLOVE BIOGEL PI IND STRL 7.0 (GLOVE) ×1 IMPLANT
GLOVE BIOGEL PI INDICATOR 7.0 (GLOVE) ×2
GOWN STRL REUS W/ TWL XL LVL3 (GOWN DISPOSABLE) ×2 IMPLANT
GOWN STRL REUS W/TWL LRG LVL3 (GOWN DISPOSABLE) ×3 IMPLANT
GOWN STRL REUS W/TWL XL LVL3 (GOWN DISPOSABLE) ×4 IMPLANT
HOVERMATT SINGLE USE (MISCELLANEOUS) ×3 IMPLANT
KIT BASIN OR (CUSTOM PROCEDURE TRAY) ×3 IMPLANT
LIQUID BAND (GAUZE/BANDAGES/DRESSINGS) ×3 IMPLANT
MESH ULTRAPRO 3X6 7.6X15CM (Mesh General) ×3 IMPLANT
NEEDLE HYPO 25X1 1.5 SAFETY (NEEDLE) ×3 IMPLANT
NS IRRIG 1000ML POUR BTL (IV SOLUTION) ×3 IMPLANT
PACK BASIC VI WITH GOWN DISP (CUSTOM PROCEDURE TRAY) ×3 IMPLANT
SPONGE LAP 18X18 X RAY DECT (DISPOSABLE) ×3 IMPLANT
STRIP CLOSURE SKIN 1/2X4 (GAUZE/BANDAGES/DRESSINGS) IMPLANT
SUT MNCRL AB 4-0 PS2 18 (SUTURE) ×3 IMPLANT
SUT PROLENE 2 0 SH DA (SUTURE) ×6 IMPLANT
SUT SILK 2 0 SH (SUTURE) IMPLANT
SUT SILK 3 0 (SUTURE) ×2
SUT SILK 3-0 18XBRD TIE 12 (SUTURE) ×1 IMPLANT
SUT VIC AB 1 CT1 36 (SUTURE) ×12 IMPLANT
SUT VIC AB 2-0 SH 27 (SUTURE) ×2
SUT VIC AB 2-0 SH 27X BRD (SUTURE) ×1 IMPLANT
SUT VIC AB 3-0 SH 27 (SUTURE) ×2
SUT VIC AB 3-0 SH 27XBRD (SUTURE) ×1 IMPLANT
SYR BULB IRRIGATION 50ML (SYRINGE) ×3 IMPLANT
SYR CONTROL 10ML LL (SYRINGE) ×3 IMPLANT
TOWEL OR 17X26 10 PK STRL BLUE (TOWEL DISPOSABLE) ×3 IMPLANT
YANKAUER SUCT BULB TIP 10FT TU (MISCELLANEOUS) ×3 IMPLANT

## 2015-06-01 NOTE — Op Note (Signed)
06/01/2015  11:55 AM  PATIENT:  Henry Hinton  55 y.o. male  PRE-OPERATIVE DIAGNOSIS:  LEFT INGUINAL HERNIA  POST-OPERATIVE DIAGNOSIS:  LEFT INGUINAL HERNIA  PROCEDURE:  Procedure(s): LEFT INGUINAL HERNIA REPAIR WITH MESH (Left) INSERTION OF MESH (Left)  SURGEON:  Surgeon(s) and Role:    * Jovita Kussmaul, MD - Primary  PHYSICIAN ASSISTANT:   ASSISTANTS: none   ANESTHESIA:   general  EBL:  Total I/O In: 1000 [I.V.:1000] Out: 50 [Blood:50]  BLOOD ADMINISTERED:none  DRAINS: none   LOCAL MEDICATIONS USED:  MARCAINE     SPECIMEN:  No Specimen  DISPOSITION OF SPECIMEN:  N/A  COUNTS:  YES  TOURNIQUET:  * No tourniquets in log *  DICTATION: .Dragon Dictation   After informed consent was obtained the patient was brought to the operating room and placed in the supine position on the operating table. After adequate induction of general anesthesia the patient's abdomen and left groin were prepped with ChloraPrep, allowed to dry, and draped in usual sterile manner. The left groin area was then infiltrated with quarter percent Marcaine. An incision was made from the edge of the pubic tubercle on the left towards the anterior superior iliac spine. The incision was carried through the skin and subcutaneous tannish tissue sharply with the electrocautery until the fascia of the external oblique was encountered. Several small bridging veins were clamped with hemostats divided and ligated with 3-0 silk ties. Because of his size a Presenter, broadcasting was used. The external oblique fascia was opened along its fibers towards the apex of the external ring with a 15 blade knife and Metzenbaum scissors. The patient had a large hernia coming through the floor of the canal which we were able to gradually reduce with gentle pressure. The floor of the canal was then repaired with interrupted 0 Vicryl stitches. The cord structures were identified and surrounded between 2 fingers. A half-inch Penrose drain  was placed around the cord structures for retraction purposes. A 3 x 6 piece of ultra Pro mesh was then chosen and cut to fit. The mesh was sewed inferiorly to the shelving edge of the inguinal ligament with a running 2-0 Prolene stitch. Superiorly the mesh was sewed to the muscular aponeurotic strength of the transversalis with interrupted 2-0 Prolene vertical mattress stitches. Tails were cut laterally and the mesh and the tails were wrapped around the cord structures. The tails of the mesh were anchored to the shelving edge of the inguinal ligament with an interrupted 2-0 Prolene stitch. Once this was accomplished the hernia appeared to be well repaired and the mesh was in good position. The wound was irrigated with copious amounts of saline. The external oblique fascia was then closed with a running 2-0 Vicryl stitch. The wound was then infiltrated with more quarter percent Marcaine. The subcutaneous fascia was then closed with a running 3-0 Vicryl stitch. The skin was then closed with a running 4-0 Monocryl subcuticular stitch. Dermabond dressings were applied. The patient tolerated the procedure well. At the end of the case all needle sponge and instrument counts were correct. The patient was then awakened and taken to recovery in stable condition.  PLAN OF CARE: Discharge to home after PACU  PATIENT DISPOSITION:  PACU - hemodynamically stable.   Delay start of Pharmacological VTE agent (>24hrs) due to surgical blood loss or risk of bleeding: not applicable

## 2015-06-01 NOTE — Anesthesia Preprocedure Evaluation (Signed)
Anesthesia Evaluation  Patient identified by MRN, date of birth, ID band Patient awake    Reviewed: Allergy & Precautions, NPO status , Patient's Chart, lab work & pertinent test results  History of Anesthesia Complications (+) Family history of anesthesia reactionNegative for: history of anesthetic complications ("father had delerium after anesthesia")  Airway Mallampati: III  TM Distance: >3 FB Neck ROM: Full    Dental no notable dental hx. (+) Dental Advisory Given, Poor Dentition   Pulmonary sleep apnea and Continuous Positive Airway Pressure Ventilation ,    Pulmonary exam normal breath sounds clear to auscultation       Cardiovascular negative cardio ROS Normal cardiovascular exam Rhythm:Regular Rate:Normal  Stress test in 2015: low risk, no ischemia No symptoms   Neuro/Psych negative neurological ROS  negative psych ROS   GI/Hepatic negative GI ROS, Neg liver ROS,   Endo/Other  Morbid obesity  Renal/GU negative Renal ROS  negative genitourinary   Musculoskeletal  (+) Arthritis ,   Abdominal (+) + obese,   Peds negative pediatric ROS (+)  Hematology negative hematology ROS (+)   Anesthesia Other Findings   Reproductive/Obstetrics negative OB ROS                             Anesthesia Physical Anesthesia Plan  ASA: III  Anesthesia Plan: General   Post-op Pain Management:    Induction: Intravenous  Airway Management Planned: Oral ETT  Additional Equipment:   Intra-op Plan:   Post-operative Plan: Extubation in OR  Informed Consent: I have reviewed the patients History and Physical, chart, labs and discussed the procedure including the risks, benefits and alternatives for the proposed anesthesia with the patient or authorized representative who has indicated his/her understanding and acceptance.   Dental advisory given  Plan Discussed with: CRNA  Anesthesia Plan  Comments: (Offered TAP block to help with postoperative pain and decrease opioid use. Patient does not like needles and reports that he handles pain well and pain medication. He refuses the block. )        Anesthesia Quick Evaluation

## 2015-06-01 NOTE — Interval H&P Note (Signed)
History and Physical Interval Note:  06/01/2015 8:08 AM  Marc Morgans Alyson Reedy  has presented today for surgery, with the diagnosis of LEFT INGUINAL HERNIA  The various methods of treatment have been discussed with the patient and family. After consideration of risks, benefits and other options for treatment, the patient has consented to  Procedure(s): LEFT INGUINAL HERNIA REPAIR WITH MESH (Left) INSERTION OF MESH (Left) as a surgical intervention .  The patient's history has been reviewed, patient examined, no change in status, stable for surgery.  I have reviewed the patient's chart and labs.  Questions were answered to the patient's satisfaction.     TOTH III,Jacob Cicero S

## 2015-06-01 NOTE — Anesthesia Postprocedure Evaluation (Signed)
Anesthesia Post Note  Patient: Henry Hinton  Procedure(s) Performed: Procedure(s) (LRB): LEFT INGUINAL HERNIA REPAIR WITH MESH (Left) INSERTION OF MESH (Left)  Patient location during evaluation: PACU Anesthesia Type: General Level of consciousness: awake and alert Pain management: pain level controlled Vital Signs Assessment: post-procedure vital signs reviewed and stable Respiratory status: spontaneous breathing, nonlabored ventilation, respiratory function stable and patient connected to nasal cannula oxygen Cardiovascular status: blood pressure returned to baseline and stable Postop Assessment: no signs of nausea or vomiting Anesthetic complications: no    Last Vitals:  Filed Vitals:   06/01/15 1343 06/01/15 1413  BP: 134/57 118/63  Pulse: 67 69  Temp: 36.7 C   Resp: 14 16    Last Pain:  Filed Vitals:   06/01/15 1415  PainSc: 6                  Lyfe Reihl JENNETTE

## 2015-06-01 NOTE — Transfer of Care (Signed)
Immediate Anesthesia Transfer of Care Note  Patient: Henry Hinton  Procedure(s) Performed: Procedure(s): LEFT INGUINAL HERNIA REPAIR WITH MESH (Left) INSERTION OF MESH (Left)  Patient Location: PACU  Anesthesia Type:General  Level of Consciousness: awake, alert , oriented and patient cooperative  Airway & Oxygen Therapy: Patient Spontanous Breathing and Patient connected to face mask oxygen  Post-op Assessment: Report given to RN, Post -op Vital signs reviewed and stable and Patient moving all extremities X 4  Post vital signs: stable  Last Vitals:  Filed Vitals:   06/01/15 0651  BP: 153/90  Pulse: 67  Temp: 36.4 C  Resp: 20    Complications: No apparent anesthesia complications

## 2015-06-01 NOTE — H&P (Signed)
Henry Hinton  Location: [facility] Patient #: [id number] DOB: 10/15/1960 Divorced / Language: Henry Hinton / Race: White Male   History of Present Illness  The patient is a 55 year old male who presents with abdominal swelling. We are asked to see the patient in consultation by Dr. Shea Stakes to evaluate him for a left inguinal hernia. The patient is a 55 year old white male who underwent a CT scan earlier this year as part of a kidney stone protocol. At that time he was found to have a left inguinal hernia that did involve a loop of possible sigmoid colon. There was no evidence of obstruction. The patient has no abdominal pain. His appetite is good and his bowels move regularly. His not noticed a bulge in either groin.   Other Problems  Cancer Kidney Stone Pulmonary Embolism / Blood Clot in Legs Sleep Apnea  Past Surgical History  Hip Surgery Bilateral. Knee Surgery Left. Oral Surgery Spinal Surgery - Lower Back Spinal Surgery - Neck Tonsillectomy  Diagnostic Studies History Colonoscopy >10 years ago  Allergies No Known Drug Allergies11/12/2014  Medication History  Lasix (40MG  Tablet, Oral) Active. Iron (325 (65 Fe)MG Tablet, Oral) Active. Potassium Chloride (10MEQ Tablet ER, Oral) Active. Xarelto (10MG  Tablet, Oral) Active. Colace (100MG  Capsule, Oral) Active. Multiple Vitamin (Oral) Active. Medications Reconciled  Social History Alcohol use Occasional alcohol use. Caffeine use Carbonated beverages, Coffee, Tea. No drug use Tobacco use Never smoker.  Family History Heart disease in male family member before age 47 Heart disease in male family member before age 77 Hypertension Mother. Prostate Cancer Father.    Review of Systems General Not Present- Appetite Loss, Chills, Fatigue, Fever, Night Sweats, Weight Gain and Weight Loss. Skin Not Present- Change in Wart/Mole, Dryness, Hives, Jaundice, New Lesions, Non-Healing  Wounds, Rash and Ulcer. HEENT Not Present- Earache, Hearing Loss, Hoarseness, Nose Bleed, Oral Ulcers, Ringing in the Ears, Seasonal Allergies, Sinus Pain, Sore Throat, Visual Disturbances, Wears glasses/contact lenses and Yellow Eyes. Respiratory Not Present- Bloody sputum, Chronic Cough, Difficulty Breathing, Snoring and Wheezing. Cardiovascular Present- Leg Cramps. Not Present- Chest Pain, Difficulty Breathing Lying Down, Palpitations, Rapid Heart Rate, Shortness of Breath and Swelling of Extremities. Gastrointestinal Present- Excessive gas. Not Present- Abdominal Pain, Bloating, Bloody Stool, Change in Bowel Habits, Chronic diarrhea, Constipation, Difficulty Swallowing, Gets full quickly at meals, Hemorrhoids, Indigestion, Nausea, Rectal Pain and Vomiting. Male Genitourinary Not Present- Blood in Urine, Change in Urinary Stream, Frequency, Impotence, Nocturia, Painful Urination, Urgency and Urine Leakage. Musculoskeletal Present- Joint Pain. Not Present- Back Pain, Joint Stiffness, Muscle Pain, Muscle Weakness and Swelling of Extremities. Neurological Not Present- Decreased Memory, Fainting, Headaches, Numbness, Seizures, Tingling, Tremor, Trouble walking and Weakness. Psychiatric Not Present- Anxiety, Bipolar, Change in Sleep Pattern, Depression, Fearful and Frequent crying. Endocrine Not Present- Cold Intolerance, Excessive Hunger, Hair Changes, Heat Intolerance, Hot flashes and New Diabetes. Hematology Not Present- Easy Bruising, Excessive bleeding, Gland problems, HIV and Persistent Infections.  Vitals Weight: 390.6 lb Height: 69in Body Surface Area: 2.75 m Body Mass Index: 57.68 kg/m  Temp.: 97.13F(Temporal)  Pulse: 84 (Regular)  BP: 142/86 (Sitting, Left Arm, Standard)       Physical Exam  General Mental Status-Alert. General Appearance-Consistent with stated age. Hydration-Well hydrated. Voice-Normal. Note: He is morbidly obese   Head and  Neck Head-normocephalic, atraumatic with no lesions or palpable masses. Trachea-midline. Thyroid Gland Characteristics - normal size and consistency.  Eye Eyeball - Bilateral-Extraocular movements intact. Sclera/Conjunctiva - Bilateral-No scleral icterus.  Chest and Lung Exam Chest  and lung exam reveals -quiet, even and easy respiratory effort with no use of accessory muscles and on auscultation, normal breath sounds, no adventitious sounds and normal vocal resonance. Inspection Chest Wall - Normal. Back - normal.  Cardiovascular Cardiovascular examination reveals -normal heart sounds, regular rate and rhythm with no murmurs and normal pedal pulses bilaterally.  Abdomen Inspection Inspection of the abdomen reveals - No Hernias. Skin - Scar - no surgical scars. Palpation/Percussion Palpation and Percussion of the abdomen reveal - Soft, Non Tender, No Rebound tenderness, No Rigidity (guarding) and No hepatosplenomegaly. Auscultation Auscultation of the abdomen reveals - Bowel sounds normal.  Male Genitourinary Note: There is no palpable bulge or impulse was straining in either groin. His size does make it difficult to examine the area   Neurologic Neurologic evaluation reveals -alert and oriented x 3 with no impairment of recent or remote memory. Mental Status-Normal.  Musculoskeletal Normal Exam - Left-Upper Extremity Strength Normal and Lower Extremity Strength Normal. Normal Exam - Right-Upper Extremity Strength Normal and Lower Extremity Strength Normal.  Lymphatic Head & Neck  General Head & Neck Lymphatics: Bilateral - Description - Normal. Axillary  General Axillary Region: Bilateral - Description - Normal. Tenderness - Non Tender. Femoral & Inguinal  Generalized Femoral & Inguinal Lymphatics: Bilateral - Description - Normal. Tenderness - Non Tender.    Assessment & Plan  INGUINAL HERNIA OF LEFT SIDE WITHOUT OBSTRUCTION OR GANGRENE  (K40.90) Impression: The patient appears to have a asymptomatic left inguinal hernia that was noticed incidentally on a CT scan for a kidney stone. Because of the risk of incarceration and strangulation he may benefit from having the hernia fixed. He does have some increased risk of surgery given his size. At this point he would like to undergo an upcoming colonoscopy and think about the hernia repair before planning any sort of surgical intervention. He will let us know when he is ready Current Plans Follow up as needed   Signed by Luella Cook, MD

## 2015-06-01 NOTE — Anesthesia Procedure Notes (Signed)
Procedure Name: Intubation Date/Time: 06/01/2015 9:48 AM Performed by: Lissa Morales Pre-anesthesia Checklist: Patient identified, Emergency Drugs available, Suction available and Patient being monitored Patient Re-evaluated:Patient Re-evaluated prior to inductionOxygen Delivery Method: Circle System Utilized Preoxygenation: Pre-oxygenation with 100% oxygen Intubation Type: IV induction Ventilation: Mask ventilation without difficulty Laryngoscope Size: Glidescope, 4 and Mac Grade View: Grade III Tube type: Oral Tube size: 8.0 mm Number of attempts: 1 Airway Equipment and Method: Stylet,  Oral airway and Video-laryngoscopy Placement Confirmation: ETT inserted through vocal cords under direct vision,  positive ETCO2 and breath sounds checked- equal and bilateral Secured at: 23 cm Tube secured with: Tape Dental Injury: Teeth and Oropharynx as per pre-operative assessment  Difficulty Due To: Difficulty was anticipated, Difficult Airway- due to large tongue, Difficult Airway- due to reduced neck mobility, Difficult Airway- due to anterior larynx, Difficult Airway-  due to edematous airway and Difficult Airway- due to limited oral opening Future Recommendations: Recommend- induction with short-acting agent, and alternative techniques readily available Comments: Pt's positioning in good sniffing position  With blankets and pads and preoxygenated for at least 3 minutes prior to induction . Needing oral airway for mask airway with  Some difficulty. Elective glidesope with laryngeal structions  a little edematous and larynx  Anterior.  Big hockey stick for  ETT.Marland Kitchen

## 2015-06-01 NOTE — Progress Notes (Signed)
Report to Susan

## 2015-06-01 NOTE — Discharge Instructions (Signed)
Cal your doctors office for any problems or questions or problems. Call for follow up appointment in 2 weeks. Light diet for 24 -48 hours. May shower in 48 hours. Milk or Mag or Miralax if needed for constipation. Ice pack for 48 hours then as needed Continue deep breathing, cough and foot pumps. Get up and walk about the house every 1 1/2 to 2 hours.   Open Hernia Repair, Care After Refer to this sheet in the next few weeks. These instructions provide you with information on caring for yourself after your procedure. Your health care provider may also give you more specific instructions. Your treatment has been planned according to current medical practices, but problems sometimes occur. Call your health care provider if you have any problems or questions after your procedure. WHAT TO EXPECT AFTER THE PROCEDURE After your procedure, it is typical to have the following:  Pain in your abdomen, especially along your incision. You will be given pain medicines to control the pain.  Constipation. You may be given a stool softener to help prevent this. HOME CARE INSTRUCTIONS  Only take over-the-counter or prescription medicines as directed by your health care provider.  Keep the incision area dry and clean. You may wash the incision area gently with soap and water 48 hours after surgery. Gently blot or dab the incision area dry. Do not take baths, use swimming pools, or use hot tubs for 10 days or until your health care provider approves.  Change bandages (dressings) as directed by your health care provider.  Continue your normal diet as directed by your health care provider. Eat plenty of fruits and vegetables to help prevent constipation.  Drink enough fluids to keep your urine clear or pale yellow. This also helps prevent constipation.  Do not drive until your health care provider says it is okay.  Do not lift anything heavier than 10 lb (4.5 kg) or play contact sports for 4 weeks or until your  health care provider approves.  Follow up with your health care provider as directed. Ask your health care provider when to make an appointment to have your stitches (sutures) or staples removed. SEEK MEDICAL CARE IF:  You have increased bleeding coming from the incision site.  You have blood in your stool.  You have increasing pain in the incision area.  You see redness or swelling in the incision area.  You have fluid (pus) coming from the incision.  You have a fever.  You notice a bad smell coming from the incision area or dressing. SEEK IMMEDIATE MEDICAL CARE IF:  You develop a rash.  You have chest pain or shortness of breath.  You feel lightheaded or feel faint.   This information is not intended to replace advice given to you by your health care provider. Make sure you discuss any questions you have with your health care provider.   Document Released: 11/04/2004 Document Revised: 05/08/2014 Document Reviewed: 11/27/2012 Elsevier Interactive Patient Education [employer].

## 2015-06-02 LAB — HEMOGLOBIN A1C
Hgb A1c MFr Bld: 6.9 % — ABNORMAL HIGH (ref 4.8–5.6)
Mean Plasma Glucose: 151 mg/dL

## 2015-06-03 ENCOUNTER — Encounter: Payer: Self-pay | Admitting: Family Medicine

## 2015-06-08 ENCOUNTER — Encounter: Payer: Self-pay | Admitting: Family Medicine

## 2015-06-17 ENCOUNTER — Ambulatory Visit (INDEPENDENT_AMBULATORY_CARE_PROVIDER_SITE_OTHER): Payer: BC Managed Care – PPO | Admitting: Family Medicine

## 2015-06-17 ENCOUNTER — Ambulatory Visit (HOSPITAL_BASED_OUTPATIENT_CLINIC_OR_DEPARTMENT_OTHER)
Admission: RE | Admit: 2015-06-17 | Discharge: 2015-06-17 | Disposition: A | Discharge status: Home / Self Care | Payer: BC Managed Care – PPO | Source: Ambulatory Visit | Attending: Family Medicine | Admitting: Family Medicine

## 2015-06-17 VITALS — BP 134/70 | HR 95 | Temp 102.3°F | Resp 16 | Ht 69.0 in | Wt >= 6400 oz

## 2015-06-17 DIAGNOSIS — Z9889 Other specified postprocedural states: Secondary | ICD-10-CM

## 2015-06-17 DIAGNOSIS — R509 Fever, unspecified: Secondary | ICD-10-CM | POA: Diagnosis not present

## 2015-06-17 DIAGNOSIS — Z86718 Personal history of other venous thrombosis and embolism: Secondary | ICD-10-CM | POA: Diagnosis not present

## 2015-06-17 DIAGNOSIS — R6889 Other general symptoms and signs: Secondary | ICD-10-CM

## 2015-06-17 DIAGNOSIS — I82812 Embolism and thrombosis of superficial veins of left lower extremities: Secondary | ICD-10-CM | POA: Insufficient documentation

## 2015-06-17 DIAGNOSIS — M79662 Pain in left lower leg: Secondary | ICD-10-CM | POA: Diagnosis present

## 2015-06-17 DIAGNOSIS — R059 Cough, unspecified: Secondary | ICD-10-CM

## 2015-06-17 DIAGNOSIS — R05 Cough: Secondary | ICD-10-CM

## 2015-06-17 DIAGNOSIS — Z8719 Personal history of other diseases of the digestive system: Secondary | ICD-10-CM

## 2015-06-17 DIAGNOSIS — I824Z2 Acute embolism and thrombosis of unspecified deep veins of left distal lower extremity: Secondary | ICD-10-CM | POA: Diagnosis not present

## 2015-06-17 LAB — POCT INFLUENZA A/B
INFLUENZA A, POC: NEGATIVE
Influenza B, POC: NEGATIVE

## 2015-06-17 MED ORDER — HYDROCODONE-HOMATROPINE 5-1.5 MG/5ML PO SYRP: 5.0000 mL | ORAL_SOLUTION | ORAL | Status: DC | PRN

## 2015-06-17 MED ORDER — OSELTAMIVIR PHOSPHATE 75 MG PO CAPS: 75.0000 mg | ORAL_CAPSULE | Freq: Two times a day (BID) | ORAL | Status: DC

## 2015-06-17 MED ORDER — RIVAROXABAN 20 MG PO TABS: 20.0000 mg | ORAL_TABLET | Freq: Every day | ORAL | Status: DC

## 2015-06-17 MED ORDER — IBUPROFEN 200 MG PO TABS
400.0000 mg | ORAL_TABLET | Freq: Once | ORAL | Status: AC
  Administered 2015-06-17: 400 mg via ORAL

## 2015-06-17 NOTE — Patient Instructions (Addendum)
Go to [place] for scheduled U/S at 8:30 pm. You will register at the Emergency Department for OUTPATIENT U/S. DO NOT REGISTER AS ED PATIENT.  [facility]  [place] [phone]       You're being sent for a Doppler ultrasound of your left calf to check on possible clot.  I am giving you a printed copy for Xarelto. If we tell you you do have a clot, then you need to start on it. If you do not have a clot please destroy the prescription.  Take the Hycodan cough syrup 1 teaspoon every 4-6 hours as needed for cough  Take the Tamiflu one twice daily to influenza. Even though your flu test was negative your symptoms are fairly classic. We are seeing a lot of influenza A, and the flu test does not detect all of it.  Drink plenty of fluids and get enough rest  Return to see Dr. Carlota Raspberry early next week.

## 2015-06-17 NOTE — Progress Notes (Signed)
Patient ID: Henry Hinton, male    DOB: April 20, 1961  Age: 55 y.o. MRN: [id number]  Chief Complaint  Patient presents with  . Headache    x 1 day   . Fever    x 1 day  . Cough  . chest congestion  . Ear Problem    rt. ear, this morning, pt says it feels hot inside ear     Subjective:   Patient has been ill since yesterday with flulike symptoms. He has a fever, body ache, cough with some darkish mucus at times. He has had a little blood in his nose. His body aches all over.  He just come back to work this week having been out for a left inguinal herniorrhaphy. He had a history of DVT last year treated with several toe. He is been off of it for about 3 weeks. Over the past week he has been having some pain in his left upper thigh and some in his left calf. He says the swelling actually is going down and is doing better.  Current allergies, medications, problem list, past/family and social histories reviewed.  Objective:  BP 134/70 mmHg  Pulse 95  Temp(Src) 102.3 F (39.1 C) (Oral)  Resp 16  Ht 5\' 9"  (1.753 m)  Wt 401 lb (181.892 kg)  BMI 59.19 kg/m2  SpO2 95%  Morbidly obese, obviously doesn't feel well. Pale looking. TMs normal. Flu swab taken. Nose congested. Throat not erythematous. Neck supple without significant nodes. Chest is clear to auscultation. Heart regular, soft systolic ejection murmur. Left inguinal area has a little old blood oozed from the incision. He still has a lot of tissue induration and probable hematoma in the left groin region. He is mildly tender in the left groin. Also tender in the left upper calf slightly medially. Negative Homans sign. No major edema.  Assessment & Plan:   Assessment: 1. Fever, unspecified   2. Flu-like symptoms   3. Calf pain, left   4. History of DVT (deep vein thrombosis)   5. History of inguinal hernia repair   6. Cough       Plan: Definite risk of DVT, needs Doppler. Flu swab.  Orders Placed This Encounter  Procedures   . US Venous Img Lower Unilateral Left    Hold pt and call report to Dr. Linna Darner at [phone]    Standing Status: Future     Number of Occurrences:      Standing Expiration Date: 08/14/2016    Order Specific Question:  Reason for Exam (SYMPTOM  OR DIAGNOSIS REQUIRED)    Answer:  L calf pain    Order Specific Question:  Preferred imaging location?    Answer:  [facility]  . POCT Influenza A/B    Meds ordered this encounter  Medications  . ibuprofen (ADVIL,MOTRIN) tablet 400 mg    Sig:   . oseltamivir (TAMIFLU) 75 MG capsule    Sig: Take 1 capsule (75 mg total) by mouth 2 (two) times daily.    Dispense:  10 capsule    Refill:  0  . HYDROcodone-homatropine (HYCODAN) 5-1.5 MG/5ML syrup    Sig: Take 5 mLs by mouth every 4 (four) hours as needed.    Dispense:  120 mL    Refill:  0  . rivaroxaban (XARELTO) 20 MG TABS tablet    Sig: Take 1 tablet (20 mg total) by mouth daily with supper. Reported on 06/17/2015    Dispense:  30 tablet  Refill:  0    Results for orders placed or performed in visit on 06/17/15  POCT Influenza A/B  Result Value Ref Range   Influenza A, POC Negative Negative   Influenza B, POC Negative Negative        Patient Instructions  Go to [place] for scheduled U/S at 8:30 pm. You will register at the Emergency Department for OUTPATIENT U/S. DO NOT REGISTER AS ED PATIENT.  [facility]  [place] [phone]       You're being sent for a Doppler ultrasound of your left calf to check on possible clot.  I am giving you a printed copy for Xarelto. If we tell you you do have a clot, then you need to start on it. If you do not have a clot please destroy the prescription.  Take the Hycodan cough syrup 1 teaspoon every 4-6 hours as needed for cough  Take the Tamiflu one twice daily to influenza. Even though your flu test was negative your symptoms are fairly classic. We are seeing a lot of  influenza A, and the flu test does not detect all of it.  Drink plenty of fluids and get enough rest  Return to see Dr. Carlota Raspberry early next week.      Return in about 5 days (around 06/22/2015).   HOPPER,DAVID, MD 06/17/2015

## 2015-06-21 ENCOUNTER — Encounter: Payer: Self-pay | Admitting: Family Medicine

## 2015-06-22 NOTE — Telephone Encounter (Signed)
See patient message. Call him and check status Wednesday am.  Specifically if short of breath or low pulse ox - needs to be seen right away in office or in ER.

## 2015-06-25 ENCOUNTER — Telehealth: Payer: Self-pay

## 2015-06-25 NOTE — Telephone Encounter (Signed)
Called and spoke to pt. Pt finished Tamiflu and having continued low grade fever. Pt still out of work because wound being re-opened. Pt is still having bleeding from wound site.  Pt getting ready to go out of work long term until they get a handle on his wound. Pt's says surgeon is possibly suggesting wound center.  Pt having continued cough and not feeling well in general. I suggested patient come to the clinic for re-evaluation today. Pt will come after his appointment with surgeon at 2:15.  Pulse ox has been staying between 93-96.

## 2015-08-25 ENCOUNTER — Telehealth: Payer: Self-pay | Admitting: Internal Medicine

## 2015-08-25 ENCOUNTER — Other Ambulatory Visit: Payer: Self-pay

## 2015-08-25 LAB — COLOGUARD: Cologuard: POSITIVE

## 2015-08-25 NOTE — Telephone Encounter (Signed)
Henry Hinton have we received these results? See notes below.

## 2015-08-25 NOTE — Telephone Encounter (Signed)
See result note.  

## 2015-08-25 NOTE — Telephone Encounter (Signed)
Spoke with [facility] to see if they needed anything else from me and they explained it was just a courtesy call making sure we got the positive result.  Vivien Rota sent the result to Dr. [name].  After he has reviewed it he will advise what to do next.

## 2015-09-01 ENCOUNTER — Other Ambulatory Visit: Payer: Self-pay

## 2015-09-01 DIAGNOSIS — Z1211 Encounter for screening for malignant neoplasm of colon: Secondary | ICD-10-CM

## 2015-09-02 ENCOUNTER — Other Ambulatory Visit: Payer: Self-pay

## 2015-09-02 DIAGNOSIS — Z1211 Encounter for screening for malignant neoplasm of colon: Secondary | ICD-10-CM

## 2015-09-02 MED ORDER — NA SULFATE-K SULFATE-MG SULF 17.5-3.13-1.6 GM/177ML PO SOLN: 1.0000 | Freq: Once | ORAL | Status: AC

## 2015-09-07 ENCOUNTER — Encounter: Payer: Self-pay | Admitting: Internal Medicine

## 2015-10-09 ENCOUNTER — Encounter (HOSPITAL_COMMUNITY): Payer: Self-pay | Admitting: Emergency Medicine

## 2015-10-09 ENCOUNTER — Inpatient Hospital Stay (HOSPITAL_COMMUNITY)
Admission: EM | Admit: 2015-10-09 | Discharge: 2015-10-14 | DRG: 603 | Disposition: A | Discharge status: Home / Self Care | Payer: BC Managed Care – PPO | Attending: Family Medicine | Admitting: Family Medicine

## 2015-10-09 ENCOUNTER — Emergency Department (HOSPITAL_COMMUNITY): Payer: BC Managed Care – PPO

## 2015-10-09 DIAGNOSIS — Z96643 Presence of artificial hip joint, bilateral: Secondary | ICD-10-CM | POA: Diagnosis present

## 2015-10-09 DIAGNOSIS — M5020 Other cervical disc displacement, unspecified cervical region: Secondary | ICD-10-CM | POA: Diagnosis present

## 2015-10-09 DIAGNOSIS — A419 Sepsis, unspecified organism: Secondary | ICD-10-CM

## 2015-10-09 DIAGNOSIS — Z96649 Presence of unspecified artificial hip joint: Secondary | ICD-10-CM | POA: Diagnosis present

## 2015-10-09 DIAGNOSIS — Z6841 Body Mass Index (BMI) 40.0 and over, adult: Secondary | ICD-10-CM | POA: Diagnosis not present

## 2015-10-09 DIAGNOSIS — M1990 Unspecified osteoarthritis, unspecified site: Secondary | ICD-10-CM | POA: Diagnosis present

## 2015-10-09 DIAGNOSIS — R51 Headache: Secondary | ICD-10-CM | POA: Diagnosis present

## 2015-10-09 DIAGNOSIS — Z86718 Personal history of other venous thrombosis and embolism: Secondary | ICD-10-CM | POA: Diagnosis not present

## 2015-10-09 DIAGNOSIS — K5900 Constipation, unspecified: Secondary | ICD-10-CM | POA: Diagnosis present

## 2015-10-09 DIAGNOSIS — Z888 Allergy status to other drugs, medicaments and biological substances status: Secondary | ICD-10-CM | POA: Diagnosis not present

## 2015-10-09 DIAGNOSIS — Z79899 Other long term (current) drug therapy: Secondary | ICD-10-CM

## 2015-10-09 DIAGNOSIS — C9110 Chronic lymphocytic leukemia of B-cell type not having achieved remission: Secondary | ICD-10-CM | POA: Diagnosis present

## 2015-10-09 DIAGNOSIS — G4733 Obstructive sleep apnea (adult) (pediatric): Secondary | ICD-10-CM | POA: Diagnosis present

## 2015-10-09 DIAGNOSIS — I878 Other specified disorders of veins: Secondary | ICD-10-CM | POA: Insufficient documentation

## 2015-10-09 DIAGNOSIS — L03311 Cellulitis of abdominal wall: Principal | ICD-10-CM | POA: Diagnosis present

## 2015-10-09 DIAGNOSIS — M793 Panniculitis, unspecified: Secondary | ICD-10-CM | POA: Diagnosis present

## 2015-10-09 LAB — URINE MICROSCOPIC-ADD ON: BACTERIA UA: NONE SEEN

## 2015-10-09 LAB — COMPREHENSIVE METABOLIC PANEL
ALK PHOS: 91 U/L (ref 38–126)
ALT: 18 U/L (ref 17–63)
ANION GAP: 4 — AB (ref 5–15)
AST: 20 U/L (ref 15–41)
Albumin: 3.2 g/dL — ABNORMAL LOW (ref 3.5–5.0)
BILIRUBIN TOTAL: 0.6 mg/dL (ref 0.3–1.2)
BUN: 13 mg/dL (ref 6–20)
CALCIUM: 7.9 mg/dL — AB (ref 8.9–10.3)
CO2: 24 mmol/L (ref 22–32)
CREATININE: 0.69 mg/dL (ref 0.61–1.24)
Chloride: 109 mmol/L (ref 101–111)
GFR calc non Af Amer: >60 mL/min (ref >60)
Glucose, Bld: 158 mg/dL — ABNORMAL HIGH (ref 65–99)
Potassium: 4.3 mmol/L (ref 3.5–5.1)
Sodium: 137 mmol/L (ref 135–145)
TOTAL PROTEIN: 6.5 g/dL (ref 6.5–8.1)

## 2015-10-09 LAB — I-STAT CHEM 8, ED
BUN: 15 mg/dL (ref 6–20)
CALCIUM ION: 1.04 mmol/L — AB (ref 1.12–1.23)
CREATININE: 0.6 mg/dL — AB (ref 0.61–1.24)
Chloride: 105 mmol/L (ref 101–111)
GLUCOSE: 153 mg/dL — AB (ref 65–99)
HCT: 38 % — ABNORMAL LOW (ref 39.0–52.0)
HEMOGLOBIN: 12.9 g/dL — AB (ref 13.0–17.0)
POTASSIUM: 4.2 mmol/L (ref 3.5–5.1)
Sodium: 141 mmol/L (ref 135–145)
TCO2: 24 mmol/L (ref 0–100)

## 2015-10-09 LAB — PROTIME-INR
INR: 1.24 (ref 0.00–1.49)
PROTHROMBIN TIME: 15.7 s — AB (ref 11.6–15.2)

## 2015-10-09 LAB — APTT: aPTT: 27 seconds (ref 24–37)

## 2015-10-09 LAB — CBC WITH DIFFERENTIAL/PLATELET
BASOS ABS: 0 10*3/uL (ref 0.0–0.1)
Basophils Relative: 0 %
EOS ABS: 0 10*3/uL (ref 0.0–0.7)
Eosinophils Relative: 0 %
HCT: 37.6 % — ABNORMAL LOW (ref 39.0–52.0)
Hemoglobin: 11.6 g/dL — ABNORMAL LOW (ref 13.0–17.0)
LYMPHS ABS: 22.4 10*3/uL — AB (ref 0.7–4.0)
Lymphocytes Relative: 69 %
MCH: 25.2 pg — AB (ref 26.0–34.0)
MCHC: 30.9 g/dL (ref 30.0–36.0)
MCV: 81.7 fL (ref 78.0–100.0)
MONOS PCT: 2 %
Monocytes Absolute: 0.6 10*3/uL (ref 0.1–1.0)
Neutro Abs: 9.4 10*3/uL — ABNORMAL HIGH (ref 1.7–7.7)
Neutrophils Relative %: 29 %
PLATELETS: 165 10*3/uL (ref 150–400)
RBC: 4.6 MIL/uL (ref 4.22–5.81)
RDW: 20.3 % — AB (ref 11.5–15.5)
WBC: 32.4 10*3/uL — AB (ref 4.0–10.5)

## 2015-10-09 LAB — URINALYSIS, ROUTINE W REFLEX MICROSCOPIC
BILIRUBIN URINE: NEGATIVE
Glucose, UA: NEGATIVE mg/dL
KETONES UR: NEGATIVE mg/dL
LEUKOCYTES UA: NEGATIVE
NITRITE: NEGATIVE
Protein, ur: NEGATIVE mg/dL
Specific Gravity, Urine: 1.026 (ref 1.005–1.030)
pH: 7.5 (ref 5.0–8.0)

## 2015-10-09 LAB — CK: CK TOTAL: 95 U/L (ref 49–397)

## 2015-10-09 LAB — I-STAT CG4 LACTIC ACID, ED: Lactic Acid, Venous: 1.22 mmol/L (ref 0.5–2.0)

## 2015-10-09 LAB — SEDIMENTATION RATE: SED RATE: 12 mm/h (ref 0–16)

## 2015-10-09 LAB — C-REACTIVE PROTEIN: CRP: 5.1 mg/dL — AB (ref <1.0)

## 2015-10-09 MED ORDER — SODIUM CHLORIDE 0.9 % IV SOLN
INTRAVENOUS | Status: DC
Start: 2015-10-09 — End: 2015-10-10
  Administered 2015-10-09: 23:00:00 via INTRAVENOUS

## 2015-10-09 MED ORDER — VANCOMYCIN HCL IN DEXTROSE 1-5 GM/200ML-% IV SOLN: 1000.0000 mg | Freq: Once | INTRAVENOUS | Status: DC

## 2015-10-09 MED ORDER — SODIUM CHLORIDE 0.9 % IV BOLUS (SEPSIS)
1000.0000 mL | Freq: Once | INTRAVENOUS | Status: AC
  Administered 2015-10-09: 1000 mL via INTRAVENOUS

## 2015-10-09 MED ORDER — SODIUM CHLORIDE 0.9% FLUSH
3.0000 mL | Freq: Two times a day (BID) | INTRAVENOUS | Status: DC
Start: 2015-10-09 — End: 2015-10-14
  Administered 2015-10-09 – 2015-10-14 (×6): 3 mL via INTRAVENOUS

## 2015-10-09 MED ORDER — ONDANSETRON HCL 4 MG/2ML IJ SOLN
4.0000 mg | INTRAMUSCULAR | Status: DC | PRN
  Administered 2015-10-09: 4 mg via INTRAVENOUS
  Filled 2015-10-09: qty 2

## 2015-10-09 MED ORDER — OXYCODONE HCL 5 MG PO TABS: 5.0000 mg | ORAL_TABLET | ORAL | Status: DC | PRN

## 2015-10-09 MED ORDER — LACTATED RINGERS IV BOLUS (SEPSIS)
1000.0000 mL | Freq: Once | INTRAVENOUS | Status: AC
  Administered 2015-10-09: 1000 mL via INTRAVENOUS

## 2015-10-09 MED ORDER — ENOXAPARIN SODIUM 40 MG/0.4ML ~~LOC~~ SOLN: 40.0000 mg | SUBCUTANEOUS | Status: DC

## 2015-10-09 MED ORDER — PIPERACILLIN-TAZOBACTAM 3.375 G IVPB
3.3750 g | Freq: Three times a day (TID) | INTRAVENOUS | Status: DC
  Administered 2015-10-09 – 2015-10-12 (×7): 3.375 g via INTRAVENOUS
  Filled 2015-10-09 (×10): qty 50

## 2015-10-09 MED ORDER — VANCOMYCIN HCL 10 G IV SOLR
1500.0000 mg | Freq: Two times a day (BID) | INTRAVENOUS | Status: DC
  Administered 2015-10-09 – 2015-10-11 (×5): 1500 mg via INTRAVENOUS
  Filled 2015-10-09 (×5): qty 1500

## 2015-10-09 MED ORDER — ACETAMINOPHEN 650 MG RE SUPP
650.0000 mg | Freq: Four times a day (QID) | RECTAL | Status: DC | PRN
Start: 2015-10-09 — End: 2015-10-14

## 2015-10-09 MED ORDER — VANCOMYCIN HCL 10 G IV SOLR
2500.0000 mg | Freq: Once | INTRAVENOUS | Status: AC
  Administered 2015-10-09: 2500 mg via INTRAVENOUS
  Filled 2015-10-09: qty 2500

## 2015-10-09 MED ORDER — ASPIRIN EC 81 MG PO TBEC
81.0000 mg | DELAYED_RELEASE_TABLET | Freq: Every day | ORAL | Status: DC
  Administered 2015-10-10 – 2015-10-14 (×5): 81 mg via ORAL
  Filled 2015-10-09 (×5): qty 1

## 2015-10-09 MED ORDER — ACETAMINOPHEN 325 MG PO TABS
650.0000 mg | ORAL_TABLET | Freq: Four times a day (QID) | ORAL | Status: DC | PRN
  Administered 2015-10-09: 650 mg via ORAL
  Filled 2015-10-09: qty 2

## 2015-10-09 MED ORDER — FENTANYL CITRATE (PF) 100 MCG/2ML IJ SOLN
50.0000 ug | INTRAMUSCULAR | Status: DC | PRN
  Administered 2015-10-09 (×2): 50 ug via INTRAVENOUS
  Filled 2015-10-09 (×2): qty 2

## 2015-10-09 MED ORDER — PIPERACILLIN-TAZOBACTAM 3.375 G IVPB 30 MIN
3.3750 g | Freq: Once | INTRAVENOUS | Status: AC
  Administered 2015-10-09: 3.375 g via INTRAVENOUS
  Filled 2015-10-09: qty 50

## 2015-10-09 MED ORDER — DEXTROSE-NACL 5-0.9 % IV SOLN
INTRAVENOUS | Status: DC
  Administered 2015-10-09: 18:00:00 via INTRAVENOUS

## 2015-10-09 MED ORDER — IOPAMIDOL (ISOVUE-300) INJECTION 61%
INTRAVENOUS | Status: AC
  Administered 2015-10-09: 100 mL
  Filled 2015-10-09: qty 100

## 2015-10-09 MED ORDER — ACETAMINOPHEN 325 MG PO TABS
650.0000 mg | ORAL_TABLET | Freq: Four times a day (QID) | ORAL | Status: DC | PRN
  Administered 2015-10-09 – 2015-10-11 (×3): 650 mg via ORAL
  Filled 2015-10-09 (×3): qty 2

## 2015-10-09 MED ORDER — SODIUM CHLORIDE 0.9 % IV BOLUS (SEPSIS)
1000.0000 mL | Freq: Once | INTRAVENOUS | Status: AC
Start: 2015-10-09 — End: 2015-10-09
  Administered 2015-10-09: 1000 mL via INTRAVENOUS

## 2015-10-09 NOTE — H&P (Signed)
[facility] Admission History and Physical Service Pager: [phone]  Patient name: Henry Hinton Medical record number: [id number] Date of birth: May 19, 1960 Age: 55 y.o. Gender: male  Primary Care Provider: [name], MD Consultants: Surgery  Code Status: Full   Chief Complaint: abdominal pain and redness  Assessment and Plan: Giomar Gusler is a 55 y.o. male presenting with abdominal pain. PMH is significant for morbid obesity, CLL, symptomatic kidney stones, and history of DVT (completed Xarelto therapy), and left inguinal hernia repair with complication of incisional hematoma.   Abdominal Cellulitis: Called code sepsis upon admission for tachycardia to 103 and fever to 102.9 F on admission. Fever improved with tylenol. qSOFA of 1. CRP 5.1. LA not elevated at 1.22. S/p 2L fluid boluses. Patient appears non-toxic. WBC 32.4, though near baseline with CLL. Vancomycin and zosyn started in the ED. Area of involvement outlined in marker. Surgery consulted in ED for concern for necrotizing fasciitis. CT was reassuring and was consistent with a cellulitis/panniculitis. If does not improve with antibiotics, could consider subcutaneous T-cell lymphoma.  - Admit to telemetry, Attending Dr. Nori Riis - Continue IV vancomycin and zosyn, per pharmacy consult. Transition to PO antibiotic with Strep coverage once patient shows clinical improvement.  - Blood cultures x 2 obtained - Urine culture obtained - q4h vital signs - Monitor for spread.  - Trend fevers - Surgery will continue to follow. Appreciate recommendations.  - Tylenol prn for mild/moderate pain; oxycodone IR 5 mg prn for severe pain.  - IVFs NS @ 125 mL/hr  CLL: WBC baseline is 22-28. Goes to [facility] and last draw was May. Gets labs drawn every three months.   - Will monitor CBCs while hospitalized.   OSA: Uses CPAP on nightly basis  - A friend of his mother will bring the machine to the hospital.    Healing Left Inguinal Hernia Wound: Had been using wound vac until April. Now doing dry dressings. Is not involved in periumbilical erythema.  - Consult WOC for recommendations  Nephrolithiasis: goes to [facility] and was last treated last year. Has been told his trigger is salt. CT abdomen showed punctate stone in the right kidney and 2 mm stone in left kidney but no hydronephrosis or stranding. Small blood on UA.   FEN/GI: Heart healthy diet Prophylaxis: Lovenox  Disposition: admitted to [facility] for cellulitis/paniculitis.   History of Present Illness:  Henry Hinton is a 55 y.o. male presenting with abdominal pain and erythema. The pain began with soreness yesterday around 5 pm. He noticed redness last night over his right abdomen below his umbilicus that has gotten worse and spread across his abdomen today. The pain is now burning and throbbing in nature. He took ibuprofen last night without relief. It is worsened with any position. He also reports some back pain but attributes this to the hospital bed and having to lay flat for the CT. He denies fevers but has had some chills this morning and now. Denies nausea and vomiting. No trouble with breathing or dysuria. He denies any drainage other than the normal moisture at his left inguinal hernia incision site, which he has been managing with dry dressing changes since the middle of April. He follows with Dr. Marlou Starks, who was his surgeon for the left inguinal hernia repair.   Review Of Systems: Per HPI with the following additions: See HPI  Otherwise the remainder of the systems were negative.  Patient Active Problem List   Diagnosis Date Noted  .  Sepsis ([place]) 10/09/2015  . CLL (chronic lymphocytic leukemia) ([place]) 05/27/2015  . 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 Leukocytosis (leucocytosis) 08/29/2014  . HNP (herniated nucleus pulposus), cervical 01/21/2013  . bilat hip replacement 04/08/2012  . Morbid obesity ([employer] [place] [employer]) 04/08/2012  . Edema 04/08/2012     Past Medical History: Past Medical History  Diagnosis Date  . Arthritis   . Numbness and tingling     Hx: of in right arm  . Heart murmur     Hx; of as a child  . Joint pain     Hx: of periodically  . Sleep apnea   . Pneumonia   . Kidney stones     Hx; of as a child  . Anemia   . cll 11-2014    CLL - 11/30/2014  . Family history of anesthesia complication     " father having delirium after anesthesia"  . DVT (deep venous thrombosis) ([place]) 6/16    Rt calf  took xarelto for 6 months    Past Surgical History: Past Surgical History  Procedure Laterality Date  . Lumbar laminectomy      Hx: of 2005  . Joint replacement    . Total hip arthroplasty      Hx: of B/L hips  . Tenosynovitis      Hx: of thumb  . Tonsillectomy    . Colonoscopy      Hx: of  . Posterior cervical laminectomy with met- rx Right 01/21/2013    Procedure: Right Sitting C6-7 Microdiskectomy with Met-rex;  Surgeon: Kristeen Miss, MD;  Location: [facility];  Service: Neurosurgery;  Laterality: Right;  Right Sitting C6-7 Microdiskectomy with Met-rex  . Spine surgery    . Knee surgery Left 07/21/2014    Dr. Noemi Chapel  . Stent placement rt ureter ([facility] hx)  10/23/2014  . Wisdom tooth extraction  1974    all 4  . Inguinal hernia repair Left 06/01/2015    Procedure: LEFT INGUINAL HERNIA REPAIR WITH MESH;  Surgeon: Autumn Messing III, MD;  Location: [facility];  Service: General;  Laterality: Left;  . Insertion of mesh Left 06/01/2015    Procedure: INSERTION OF MESH;  Surgeon: Autumn Messing III, MD;  Location: [facility];  Service: General;  Laterality: Left;    Social History: Social History  Substance Use Topics  . Smoking status: Never Smoker   . Smokeless tobacco: Former Systems developer    Types: [place] date: 05/01/1993  . Alcohol Use: 0.6 oz/week    1 Standard drinks or equivalent per week     Comment: BEER AND LIQUOR   Additional social history: no tobacco or alcohol use. Lives with mother.  Please also refer to relevant  sections of EMR.  Family History: Family History  Problem Relation Age of Onset  . COPD Maternal Grandmother   . Heart disease Maternal Grandfather   . Lung cancer Paternal Grandfather     LUNG  . Diabetes Father   . Prostate cancer Father     PROSTATE    Allergies and Medications: Allergies  Allergen Reactions  . Adhesive [Tape] Other (See Comments)    Blisters and rash at site   No current facility-administered medications on file prior to encounter.   Current Outpatient Prescriptions on File Prior to Encounter  Medication Sig Dispense Refill  . ferrous sulfate (IRON SUPPLEMENT) 325 (65 FE) MG tablet Take 325 mg by mouth daily with breakfast.    . furosemide (LASIX) 40 MG tablet Take  1 tablet (40 mg total) by mouth daily. 90 tablet 2  . potassium chloride (KLOR-CON 10) 10 MEQ tablet TAKE 1 TABLET (10 MEQ TOTAL) BY MOUTH DAILY. 90 tablet 2    Objective: BP 135/63 mmHg  Pulse 82  Temp(Src) 99.2 F (37.3 C) (Oral)  Resp 16  Ht '5\' 9"'  (1.753 m)  Wt 394 lb (178.717 kg)  BMI 58.16 kg/m2  SpO2 97% Exam: General: Obese male, lying in bed, in mild distress Eyes: PERRLA, EOMI ENTM: MM moist, oropharynx normal Neck: FROM, supple Cardiovascular: RRR, S1, S2, no murmurs appreciated Respiratory: CTAB, no increased WOB Abdomen: +BS, TTP over LLQ and across periumbilical region over area of erythema (especially over right lumbar region), no rebound tenderness or guarding. No erythema below pannus (see pictures below).  MSK: 1+ pitting edema of lower leg with chronic venous stasis changes of shins.  Skin: Erythema across periumbilical region ([id number]), without fluctuance or crepitus. No drainage from previous [facility] repair incision. Hands and feet somewhat cool but cap refill brisk.  Neuro: AOx3, no focal deficits Psych: Normal mood and affect          Labs and Imaging: CBC BMET   Recent Labs Lab 10/09/15 1056 10/09/15 1131  WBC 32.4*  --   HGB 11.6* 12.9*  HCT 37.6*  38.0*  PLT 165  --     Recent Labs Lab 10/09/15 1056 10/09/15 1131  NA 137 141  K 4.3 4.2  CL 109 105  CO2 24  --   BUN 13 15  CREATININE 0.69 0.60*  GLUCOSE 158* 153*  CALCIUM 7.9*  --      Ct Abdomen Pelvis W Contrast  10/09/2015  CLINICAL DATA:  Fever.  Swelling inferior to the umbilicus. EXAM: CT ABDOMEN AND PELVIS WITH CONTRAST TECHNIQUE: Multidetector CT imaging of the abdomen and pelvis was performed using the standard protocol following bolus administration of intravenous contrast. CONTRAST:  1 ISOVUE-300 IOPAMIDOL (ISOVUE-300) INJECTION 61% COMPARISON:  April 26, 2015 CT scan FINDINGS: Normal lung bases. No free air or free fluid. There is a periumbilical fat containing hernia, unchanged since December of 2016. There is skin thickening anteriorly, inferior to the umbilicus. Scarring in the left lower pelvis is seen at a site of previous hernia repair. No evidence of repair failure in this region. There is a punctate stone in the right kidney on coronal image 124 and a 2 mm stone in the left kidney on coronal image 111. No hydronephrosis or perinephric stranding. There is a probable cyst in the right kidney on coronal image 144, too small to characterize and probably not changed in the interval. A second similar probable cyst is seen on coronal image 148 with a tiny stone seen in the right kidney on the same image. No ureterectasis or ureteral stones. The abdominal aorta is normal in caliber with mild atherosclerotic change. The gallbladder is distended but otherwise unremarkable. The liver is otherwise unremarkable. The spleen measures 15 cm in cranial caudal dimension which is unchanged. The adrenal glands and pancreas are normal. No adenopathy. The colon and appendix are normal. The stomach and small bowel are unremarkable. The pelvis demonstrates no adenopathy or mass. The bladder is normal. The patient is status post bilateral hip replacements obscuring portions of the pelvis but no  adenopathy or suspicious masses seen. Degenerative changes are seen in the lower lumbar spine. No other acute bony abnormalities. IMPRESSION: 1. Fat containing periumbilical hernia, unchanged since December 2016 2. Skin thickening inferior to the umbilicus  is relatively diffuse and likely represents cellulitis and/or panniculitis. 3. Mildly enlarged spleen, unchanged. 4. Scarring in the left lower pelvis consistent with site of previous hernia repair. There are a few reactive nodes in this region. 5. Nonobstructive stones in the kidneys. Electronically Signed   By: Dorise Bullion III M.D   On: 10/09/2015 13:04    Hillary Corinda Gubler, MD 10/09/2015, 2:39 PM PGY-1, [place] Intern pager: [phone], text pages welcome

## 2015-10-09 NOTE — Progress Notes (Signed)
cpap on, self home unit used

## 2015-10-09 NOTE — ED Notes (Signed)
Patient transported to CT 

## 2015-10-09 NOTE — ED Notes (Signed)
Attempted to call report

## 2015-10-09 NOTE — Consult Note (Signed)
Reason for Consult: abdominal wall cellulitis  Referring Physician: Dr. Leo Grosser   HPI: Henry Hinton is a 55 year old male with a history of LIH repair with mesh by Dr. Marlou Starks in January of 6160 complicated by incisional hematoma which required evacuation and opening of the wound.  It was managed by wet to dry dressing changes and then a wound vac, now it is healed and applying a dry dressing to a small area.  He presents today with abdominal wall erythema, pain and chills.  This started suddenly yesterday.  Denies any drainage.  Denies any fevers prior to ED.  No modifying factors.  No aggravating or alleviating factors.  ED work up reveals, temp 102.9, mild tachycardia at 102, lactic acid 1.22, CBC is pending.  Blood pressure is stable to high.  He has been started on Vanc and Zosyn. Denies history of DM, tobacco use. NPO since last night.   Past Medical History  Diagnosis Date  . Arthritis   . Numbness and tingling     Hx: of in right arm  . Heart murmur     Hx; of as a child  . Joint pain     Hx: of periodically  . Sleep apnea   . Pneumonia   . Kidney stones     Hx; of as a child  . Anemia   . cll 11-2014    CLL - 11/30/2014  . Family history of anesthesia complication     " father having delirium after anesthesia"  . DVT (deep venous thrombosis) ([place]) 6/16    Rt calf  took xarelto for 6 months    Past Surgical History  Procedure Laterality Date  . Lumbar laminectomy      Hx: of 2005  . Joint replacement    . Total hip arthroplasty      Hx: of B/L hips  . Tenosynovitis      Hx: of thumb  . Tonsillectomy    . Colonoscopy      Hx: of  . Posterior cervical laminectomy with met- rx Right 01/21/2013    Procedure: Right Sitting C6-7 Microdiskectomy with Met-rex;  Surgeon: Kristeen Miss, MD;  Location: [facility];  Service: Neurosurgery;  Laterality: Right;  Right Sitting C6-7 Microdiskectomy with Met-rex  . Spine surgery    . Knee surgery Left 07/21/2014    Dr. Noemi Chapel  .  Stent placement rt ureter ([facility] hx)  10/23/2014  . Wisdom tooth extraction  1974    all 4  . Inguinal hernia repair Left 06/01/2015    Procedure: LEFT INGUINAL HERNIA REPAIR WITH MESH;  Surgeon: Autumn Messing III, MD;  Location: [facility];  Service: General;  Laterality: Left;  . Insertion of mesh Left 06/01/2015    Procedure: INSERTION OF MESH;  Surgeon: Autumn Messing III, MD;  Location: [facility];  Service: General;  Laterality: Left;    Family History  Problem Relation Age of Onset  . COPD Maternal Grandmother   . Heart disease Maternal Grandfather   . Lung cancer Paternal Grandfather     LUNG  . Diabetes Father   . Prostate cancer Father     PROSTATE    Social History:  reports that he has never smoked. He quit smokeless tobacco use about 22 years ago. His smokeless tobacco use included Chew. He reports that he drinks about 0.6 oz of alcohol per week. He reports that he does not use illicit drugs.  Allergies:  Allergies  Allergen Reactions  .  Adhesive [Tape] Other (See Comments)    Blisters and rash at site    Medications:  Scheduled Meds:  Continuous Infusions: . lactated ringers     And  . lactated ringers    . vancomycin     PRN Meds:.fentaNYL (SUBLIMAZE) injection, ondansetron (ZOFRAN) IV   Results for orders placed or performed during the hospital encounter of 10/09/15 (from the past 48 hour(s))  I-Stat CG4 Lactic Acid, ED  (not at  [facility])     Status: None   Collection Time: 10/09/15 10:39 AM  Result Value Ref Range   Lactic Acid, Venous 1.22 0.5 - 2.0 mmol/L    No results found.  Review of Systems  Constitutional: Positive for malaise/fatigue. Negative for fever, chills and diaphoresis.  Eyes: Negative for blurred vision, double vision, photophobia, pain, discharge and redness.  Respiratory: Negative for cough, hemoptysis, sputum production, shortness of breath and wheezing.   Cardiovascular: Negative for chest pain, palpitations, orthopnea, claudication, leg swelling and  PND.  Gastrointestinal: Negative for heartburn, nausea, vomiting, abdominal pain, diarrhea, constipation, blood in stool and melena.  Genitourinary: Negative for dysuria, urgency, frequency, hematuria and flank pain.  Musculoskeletal: Negative for myalgias, back pain, joint pain, falls and neck pain.  Neurological: Negative for dizziness, tingling, tremors, sensory change, speech change, focal weakness, seizures, loss of consciousness and weakness.   Blood pressure 171/93, pulse 95, temperature 102.9 F (39.4 C), temperature source Rectal, resp. rate 23, height '5\' 9"'  (1.753 m), weight 178.717 kg (394 lb), SpO2 95 %. Physical Exam  Constitutional: He is oriented to person, place, and time. He appears well-developed and well-nourished. He appears distressed.  HENT:  Head: Normocephalic and atraumatic.  Neck: Normal range of motion. Neck supple.  Cardiovascular: Normal rate, regular rhythm, normal heart sounds and intact distal pulses.  Exam reveals no gallop and no friction rub.   No murmur heard. Respiratory: Effort normal and breath sounds normal. No respiratory distress. He has no wheezes. He has no rales. He exhibits no tenderness.  GI: Soft. Bowel sounds are normal.  30x15cm area of erythema without palpable fluctuance. Medial to this is an incision that is well healed except for 1 superficial area that is clean.  Musculoskeletal: Normal range of motion. He exhibits edema. He exhibits no tenderness.  Neurological: He is alert and oriented to person, place, and time.  Skin: Skin is warm. No rash noted. He is not diaphoretic. No erythema. No pallor.  Psychiatric: He has a normal mood and affect. His behavior is normal. Judgment and thought content normal.    Assessment/Plan: Right abdominal wall cellulitis-possible abscess, but on palpation no significant fluctuance or induration.  Agree with CT scan to further characterize and determine if there is a need for surgical debridement. ID-Vanc  and Zosyn FEN-NPO for now, IVF, give tylenol Dispo-pending CT  Marirose Deveney ANP-BC 10/09/2015, 11:12 AM

## 2015-10-09 NOTE — ED Provider Notes (Signed)
CSN: [id number]     Arrival date & time 10/09/15  3149 History   First MD Initiated Contact with Patient 10/09/15 [phone]     Chief Complaint  Patient presents with  . Abdominal Pain     (Consider location/radiation/quality/duration/timing/severity/associated sxs/prior Treatment) HPI Henry Hinton is a 55 year old gentleman with past medical history of kidney stones that is post stent placement in the right ureter in June 2016, anemia, CLL, left inguinal hernia status post repair in Jan 2017 by Dr. Marlou Starks presenting with complications at his surgical site.  Patient states that he was previous sales around town for history of DVT that was discontinued 2 weeks prior to his surgery in January. On 06/21/15 he had to have the hernia surgical site re-opened due to accumulated hematoma. He had a wound vac on until mid April. The area was improving up until last night. He started having significant redness, swelling, and throbbing pain last night that has progressively worsened. He's had subjective fevers. No groin pain, all his pain is around the surgical site and across his abdomen where the redness has progressed. No drainage. Ibuprofen didn't help the pain, his last dose was at 9pm yesterday evening.  He has been cleaning the surgical site religiously as instructed. NPO since last night.    Past Medical History  Diagnosis Date  . Arthritis   . Numbness and tingling     Hx: of in right arm  . Heart murmur     Hx; of as a child  . Joint pain     Hx: of periodically  . Sleep apnea   . Pneumonia   . Kidney stones     Hx; of as a child  . Anemia   . cll 11-2014    CLL - 11/30/2014  . Family history of anesthesia complication     " father having delirium after anesthesia"  . DVT (deep venous thrombosis) ([place]) 6/16    Rt calf  took xarelto for 6 months   Past Surgical History  Procedure Laterality Date  . Lumbar laminectomy      Hx: of 2005  . Joint replacement    . Total hip arthroplasty     Hx: of B/L hips  . Tenosynovitis      Hx: of thumb  . Tonsillectomy    . Colonoscopy      Hx: of  . Posterior cervical laminectomy with met- rx Right 01/21/2013    Procedure: Right Sitting C6-7 Microdiskectomy with Met-rex;  Surgeon: Kristeen Miss, MD;  Location: [facility];  Service: Neurosurgery;  Laterality: Right;  Right Sitting C6-7 Microdiskectomy with Met-rex  . Spine surgery    . Knee surgery Left 07/21/2014    Dr. Noemi Chapel  . Stent placement rt ureter ([facility] hx)  10/23/2014  . Wisdom tooth extraction  1974    all 4  . Inguinal hernia repair Left 06/01/2015    Procedure: LEFT INGUINAL HERNIA REPAIR WITH MESH;  Surgeon: Autumn Messing III, MD;  Location: [facility];  Service: General;  Laterality: Left;  . Insertion of mesh Left 06/01/2015    Procedure: INSERTION OF MESH;  Surgeon: Autumn Messing III, MD;  Location: [facility];  Service: General;  Laterality: Left;   Family History  Problem Relation Age of Onset  . COPD Maternal Grandmother   . Heart disease Maternal Grandfather   . Lung cancer Paternal Grandfather     LUNG  . Diabetes Father   . Prostate cancer Father  PROSTATE   Social History  Substance Use Topics  . Smoking status: Never Smoker   . Smokeless tobacco: Former Systems developer    Types: [place] date: 05/01/1993  . Alcohol Use: 0.6 oz/week    1 Standard drinks or equivalent per week     Comment: BEER AND LIQUOR    Review of Systems  Constitutional: Positive for fever. Negative for chills, diaphoresis, activity change and fatigue.  HENT: Negative.   Eyes: Negative.   Respiratory: Negative for cough, chest tightness, shortness of breath, wheezing and stridor.   Cardiovascular: Negative for chest pain, palpitations and leg swelling.  Gastrointestinal: Positive for abdominal pain. Negative for nausea, vomiting, diarrhea and constipation.  Endocrine: Negative.   Genitourinary: Negative.   Musculoskeletal: Negative.   Skin: Positive for color change, rash and wound.    Allergic/Immunologic: Negative.   Neurological: Negative.   Hematological: Negative for adenopathy.  Psychiatric/Behavioral: Negative.       Allergies  Adhesive  Home Medications   Prior to Admission medications   Medication Sig Start Date End Date Taking? Authorizing Provider  aspirin EC 81 MG tablet Take 81 mg by mouth daily.   Yes Historical Provider, MD  ferrous sulfate (IRON SUPPLEMENT) 325 (65 FE) MG tablet Take 325 mg by mouth daily with breakfast.   Yes Historical Provider, MD  furosemide (LASIX) 40 MG tablet Take 1 tablet (40 mg total) by mouth daily. 02/15/15  Yes [name], MD  hydrocortisone cream 1 % Apply 1 application topically 2 (two) times daily as needed for itching.   Yes Historical Provider, MD  Multiple Vitamins-Minerals (MULTIVITAMIN WITH MINERALS) tablet Take 1 tablet by mouth daily.   Yes Historical Provider, MD  potassium chloride (KLOR-CON 10) 10 MEQ tablet TAKE 1 TABLET (10 MEQ TOTAL) BY MOUTH DAILY. 02/15/15  Yes [name], MD   BP 135/63 mmHg  Pulse 82  Temp(Src) 99.2 F (37.3 C) (Oral)  Resp 16  Ht '5\' 9"'  (1.753 m)  Wt 178.717 kg  BMI 58.16 kg/m2  SpO2 97% Physical Exam  Constitutional: He is oriented to person, place, and time. He appears well-developed and well-nourished. No distress.  HENT:  Head: Normocephalic and atraumatic.  Nose: Nose normal.  Mouth/Throat: Oropharynx is clear and moist. No oropharyngeal exudate.  Eyes: Conjunctivae are normal. Right eye exhibits no discharge. Left eye exhibits no discharge. No scleral icterus.  Cardiovascular: Normal rate, regular rhythm, normal heart sounds and intact distal pulses.   Pulmonary/Chest: Effort normal and breath sounds normal. No respiratory distress. He has no wheezes. He has no rales.  Abdominal: Soft. Bowel sounds are normal. He exhibits no distension. There is no tenderness. There is no rebound and no guarding.  Musculoskeletal: He exhibits no edema.  Lymphadenopathy:     He has no cervical adenopathy.  Neurological: He is alert and oriented to person, place, and time. He exhibits normal muscle tone.  Skin: He is not diaphoretic. There is erythema.     Significant erythema, warmth, induration, and crepitus noted on exam. No fluctuance. Exquisitely tender to palpation. Surgical incision deep in a crevice of his pannus with erythema, clear/minimally yellow thin drainage.     ED Course  Procedures (including critical care time)  10:00am:  Given concerns for necrotizing fasciitis versus panniculitis, general surgery was consulted. Sepsis protocol was implemented. He will get 4L fluid bolus. Rectal temperature ordered. CBC with diff, coags, CK, CMP, lactic acid, blood cultures, U/A obtained. Will be started on Vanc/Zosyn.  CT abdomen/pelvis ordered. Will keep NPO given concerns he will need surgical intervention.   10:37: Rectal temperature 102.27F. Lactic acid 1.22. Fluids started.   11:40: Patient with a leukocytosis up to 32.4. Last 33.9 in 08/2015 from care everywhere. CRP elevated to 5.1. LRINEC score 3. Per surgery note, waiting for CT.   Sepsis - Repeat Assessment  Performed at:    1200  Vitals    Blood pressure 127/66, pulse 85, temperature 99.2 F (37.3 C), temperature source Oral, resp. rate 22, height '5\' 9"'  (1.753 m), weight 178.717 kg, SpO2 90 %.  Heart:     Regular rate and rhythm  Lungs:    CTA  Capillary Refill:   <2 sec  Peripheral Pulse:   Radial pulse palpable  Skin:     Normal Color   1315: no evidence of necrotizing fasciitis on CT abdomen/pelvis. Nonsurgical intervention  Labs Review Labs Reviewed  COMPREHENSIVE METABOLIC PANEL - Abnormal; Notable for the following:    Glucose, Bld 158 (*)    Calcium 7.9 (*)    Albumin 3.2 (*)    Anion gap 4 (*)    All other components within normal limits  CBC WITH DIFFERENTIAL/PLATELET - Abnormal; Notable for the following:    WBC 32.4 (*)    Hemoglobin 11.6 (*)    HCT 37.6 (*)    MCH  25.2 (*)    RDW 20.3 (*)    Neutro Abs 9.4 (*)    Lymphs Abs 22.4 (*)    All other components within normal limits  URINALYSIS, ROUTINE W REFLEX MICROSCOPIC (NOT AT [facility]) - Abnormal; Notable for the following:    Hgb urine dipstick SMALL (*)    All other components within normal limits  PROTIME-INR - Abnormal; Notable for the following:    Prothrombin Time 15.7 (*)    All other components within normal limits  C-REACTIVE PROTEIN - Abnormal; Notable for the following:    CRP 5.1 (*)    All other components within normal limits  URINE MICROSCOPIC-ADD ON - Abnormal; Notable for the following:    Squamous Epithelial / LPF 0-5 (*)    All other components within normal limits  I-STAT CHEM 8, ED - Abnormal; Notable for the following:    Creatinine, Ser 0.60 (*)    Glucose, Bld 153 (*)    Calcium, Ion 1.04 (*)    Hemoglobin 12.9 (*)    HCT 38.0 (*)    All other components within normal limits  CULTURE, BLOOD (ROUTINE X 2)  CULTURE, BLOOD (ROUTINE X 2)  URINE CULTURE  CK  APTT  SEDIMENTATION RATE  I-STAT CG4 LACTIC ACID, ED    Imaging Review Ct Abdomen Pelvis W Contrast  10/09/2015  CLINICAL DATA:  Fever.  Swelling inferior to the umbilicus. EXAM: CT ABDOMEN AND PELVIS WITH CONTRAST TECHNIQUE: Multidetector CT imaging of the abdomen and pelvis was performed using the standard protocol following bolus administration of intravenous contrast. CONTRAST:  1 ISOVUE-300 IOPAMIDOL (ISOVUE-300) INJECTION 61% COMPARISON:  April 26, 2015 CT scan FINDINGS: Normal lung bases. No free air or free fluid. There is a periumbilical fat containing hernia, unchanged since December of 2016. There is skin thickening anteriorly, inferior to the umbilicus. Scarring in the left lower pelvis is seen at a site of previous hernia repair. No evidence of repair failure in this region. There is a punctate stone in the right kidney on coronal image 124 and a 2 mm stone in the left kidney on coronal image 111.  No  hydronephrosis or perinephric stranding. There is a probable cyst in the right kidney on coronal image 144, too small to characterize and probably not changed in the interval. A second similar probable cyst is seen on coronal image 148 with a tiny stone seen in the right kidney on the same image. No ureterectasis or ureteral stones. The abdominal aorta is normal in caliber with mild atherosclerotic change. The gallbladder is distended but otherwise unremarkable. The liver is otherwise unremarkable. The spleen measures 15 cm in cranial caudal dimension which is unchanged. The adrenal glands and pancreas are normal. No adenopathy. The colon and appendix are normal. The stomach and small bowel are unremarkable. The pelvis demonstrates no adenopathy or mass. The bladder is normal. The patient is status post bilateral hip replacements obscuring portions of the pelvis but no adenopathy or suspicious masses seen. Degenerative changes are seen in the lower lumbar spine. No other acute bony abnormalities. IMPRESSION: 1. Fat containing periumbilical hernia, unchanged since December 2016 2. Skin thickening inferior to the umbilicus is relatively diffuse and likely represents cellulitis and/or panniculitis. 3. Mildly enlarged spleen, unchanged. 4. Scarring in the left lower pelvis consistent with site of previous hernia repair. There are a few reactive nodes in this region. 5. Nonobstructive stones in the kidneys. Electronically Signed   By: Dorise Bullion III M.D   On: 10/09/2015 13:04   I have personally reviewed and evaluated these images and lab results as part of my medical decision-making.   EKG Interpretation None      MDM   Final diagnoses:  Sepsis, due to unspecified organism [facility])  Panniculitis    Mr. Heeney is a 55 year old woman presenting with a recent left femoral hernia repair in January, with evacuation and opening in February with wound vac placement until mid April presenting with rapidly  progressing erythema, warmth, and pain from the site. He was tachycardic and febrile up to 102.9 rectally on presentation, meeting criteria for sepsis. Cod sepsis called. Patient started on IV fluids.   Initial concern for necrotizing fasciitis given the crepitus noted with exam and rapid progression. This was ruled out with CT abdomen/pelvis. Blood cultures and urine cultures have been obtained. Difficult to ascertain an increase in his white blood count given the fact that he has CLL (appears stable from [place]). He has received Vanc and Zosyn.  Discussed with Dr. Ola Spurr, will admit to telemetry, attending Dr. Dorcas Mcmurray.   Archie Patten, MD 10/09/15 1442  Leo Grosser, MD 10/09/15 9030

## 2015-10-09 NOTE — ED Notes (Addendum)
Pt comes from home with c/o pain around periumbilical area. Skin is red, swollen, and warm to touch. Pt has bandage within abdominal fold from prior hernia surgery. No drainage noted on dsg. Dsg dry and intact.

## 2015-10-09 NOTE — Progress Notes (Signed)
Pharmacy Antibiotic Note  Henry Hinton is a 55 y.o. male admitted on 10/09/2015 with cellulitis of left inguinal hernia surgical site.  Pharmacy has been consulted for Vancomycin and Zosyn dosing. Patient has had subjective fevers and pain around the surgical site. WBC 32.4. Tm 102.21F. nCrCl > 100 mL/min   Plan: -Vancomycin 2500 mg IV once followed by Vancomycin 1500 mg IV Q 12 hours -Zosyn 3.375 gm IV Q 8 hours -Monitor CBC, renal fx, cultures and clinical progress -VT at SS   Height: 5\' 9"  (175.3 cm) Weight: (!) 394 lb (178.717 kg) IBW/kg (Calculated) : 70.7  Temp (24hrs), Avg:99.1 F (37.3 C), Min:99.1 F (37.3 C), Max:99.1 F (37.3 C)  No results for input(s): WBC, CREATININE, LATICACIDVEN, VANCOTROUGH, VANCOPEAK, VANCORANDOM, GENTTROUGH, GENTPEAK, GENTRANDOM, TOBRATROUGH, TOBRAPEAK, TOBRARND, AMIKACINPEAK, AMIKACINTROU, AMIKACIN in the last 168 hours.  CrCl cannot be calculated (Patient has no serum creatinine result on file.).    Allergies  Allergen Reactions  . Adhesive [Tape] Other (See Comments)    Blisters and rash at site    Antimicrobials this admission: 6/10 Vanc>> 6/10 Zosyn>>  Dose adjustments this admission: None   Microbiology results: 6/10 BCx2>> 6/10 UCx>>   Thank you for allowing pharmacy to be a part of this patient's care.  Albertina Parr, PharmD., BCPS Clinical Pharmacist Pager [phone]

## 2015-10-10 DIAGNOSIS — M793 Panniculitis, unspecified: Secondary | ICD-10-CM

## 2015-10-10 LAB — BASIC METABOLIC PANEL
ANION GAP: 5 (ref 5–15)
BUN: 8 mg/dL (ref 6–20)
CHLORIDE: 106 mmol/L (ref 101–111)
CO2: 27 mmol/L (ref 22–32)
CREATININE: 0.71 mg/dL (ref 0.61–1.24)
Calcium: 7.8 mg/dL — ABNORMAL LOW (ref 8.9–10.3)
GFR calc non Af Amer: >60 mL/min (ref >60)
Glucose, Bld: 134 mg/dL — ABNORMAL HIGH (ref 65–99)
Potassium: 4.1 mmol/L (ref 3.5–5.1)
SODIUM: 138 mmol/L (ref 135–145)

## 2015-10-10 LAB — CBC
HCT: 36.7 % — ABNORMAL LOW (ref 39.0–52.0)
Hemoglobin: 11 g/dL — ABNORMAL LOW (ref 13.0–17.0)
MCH: 24.9 pg — ABNORMAL LOW (ref 26.0–34.0)
MCHC: 30 g/dL (ref 30.0–36.0)
MCV: 83 fL (ref 78.0–100.0)
PLATELETS: 126 10*3/uL — AB (ref 150–400)
RBC: 4.42 MIL/uL (ref 4.22–5.81)
RDW: 20.7 % — ABNORMAL HIGH (ref 11.5–15.5)
WBC: 32.6 10*3/uL — AB (ref 4.0–10.5)

## 2015-10-10 LAB — URINE CULTURE: Culture: <10000 — AB

## 2015-10-10 MED ORDER — ENOXAPARIN SODIUM 80 MG/0.8ML ~~LOC~~ SOLN
80.0000 mg | SUBCUTANEOUS | Status: DC
  Filled 2015-10-10: qty 0.8

## 2015-10-10 NOTE — Consult Note (Signed)
WOC consult requested for abd wound.  Surgery team has already consulted and provided plan of care.  Please refer to their team for further questions. Please re-consult if further assistance is needed.  Thank-you,  Julien Girt MSN, [place], [place], [place], [place]

## 2015-10-10 NOTE — Progress Notes (Signed)
Patient ID: Henry Hinton, male   DOB: 1960/10/26, 55 y.o.   MRN: [id number]     [facility]      [address] [place] [place], [place] [address]    Phone: [phone] FAX: [phone]     Subjective: Temp curve down. Wbc unchanged.  No further chills.   Objective:  Vital signs:  Filed Vitals:   10/09/15 1630 10/09/15 2015 10/10/15 0011 10/10/15 0417  BP: 170/90 122/48 100/42 110/49  Pulse: 103 97 83 66  Temp: 99.4 F (37.4 C) 98.5 F (36.9 C) 100 F (37.8 C) 98.6 F (37 C)  TempSrc:      Resp: '19 18 19 18  ' Height:      Weight:      SpO2: 100% 94% 94% 99%    Last BM Date: 10/08/15  Intake/Output   Yesterday:  06/10 0701 - 06/11 0700 In: 50 [IV Piggyback:50] Out: 1400 [Urine:1400] This shift: I/O last 3 completed shifts: In: 72 [IV Piggyback:50] Out: 56 [Urine:1400]   Physical Exam: General: Pt awake/alert/oriented x4 in no acute distress  Abdomen: Soft.  Nondistended.  RLQ pannus with erythema, soft, no areas of fluctuance, medially is small superficial wound.  Erythema is perhaps a bit better/unchanged.   No evidence of peritonitis.  No incarcerated hernias.    Problem List:   Active Problems:   Sepsis ([place])   Panniculitis    Results:   Labs: Results for orders placed or performed during the hospital encounter of 10/09/15 (from the past 48 hour(s))  Blood Culture (routine x 2)     Status: None (Preliminary result)   Collection Time: 10/09/15 10:25 AM  Result Value Ref Range   Specimen Description BLOOD RIGHT HAND    Special Requests BOTTLES DRAWN AEROBIC AND ANAEROBIC 5CC    Culture NO GROWTH < 12 HOURS    Report Status PENDING   Blood Culture (routine x 2)     Status: None (Preliminary result)   Collection Time: 10/09/15 10:30 AM  Result Value Ref Range   Specimen Description BLOOD RIGHT ANTECUBITAL    Special Requests BOTTLES DRAWN AEROBIC ONLY 10CC    Culture NO GROWTH < 12 HOURS    Report  Status PENDING   I-Stat CG4 Lactic Acid, ED  (not at  [facility])     Status: None   Collection Time: 10/09/15 10:39 AM  Result Value Ref Range   Lactic Acid, Venous 1.22 0.5 - 2.0 mmol/L  Comprehensive metabolic panel     Status: Abnormal   Collection Time: 10/09/15 10:56 AM  Result Value Ref Range   Sodium 137 135 - 145 mmol/L   Potassium 4.3 3.5 - 5.1 mmol/L   Chloride 109 101 - 111 mmol/L   CO2 24 22 - 32 mmol/L   Glucose, Bld 158 (H) 65 - 99 mg/dL   BUN 13 6 - 20 mg/dL   Creatinine, Ser 0.69 0.61 - 1.24 mg/dL   Calcium 7.9 (L) 8.9 - 10.3 mg/dL   Total Protein 6.5 6.5 - 8.1 g/dL   Albumin 3.2 (L) 3.5 - 5.0 g/dL   AST 20 15 - 41 U/L   ALT 18 17 - 63 U/L   Alkaline Phosphatase 91 38 - 126 U/L   Total Bilirubin 0.6 0.3 - 1.2 mg/dL   GFR calc non Af Amer >60 >60 mL/min   GFR calc Af Amer >60 >60 mL/min    Comment: (NOTE) The eGFR has been calculated using the  CKD EPI equation. This calculation has not been validated in all clinical situations. eGFR's persistently <60 mL/min signify possible Chronic Kidney Disease.    Anion gap 4 (L) 5 - 15  CBC WITH DIFFERENTIAL     Status: Abnormal   Collection Time: 10/09/15 10:56 AM  Result Value Ref Range   WBC 32.4 (H) 4.0 - 10.5 K/uL   RBC 4.60 4.22 - 5.81 MIL/uL   Hemoglobin 11.6 (L) 13.0 - 17.0 g/dL   HCT 37.6 (L) 39.0 - 52.0 %   MCV 81.7 78.0 - 100.0 fL   MCH 25.2 (L) 26.0 - 34.0 pg   MCHC 30.9 30.0 - 36.0 g/dL   RDW 20.3 (H) 11.5 - 15.5 %   Platelets 165 150 - 400 K/uL   Neutrophils Relative % 29 %   Lymphocytes Relative 69 %   Monocytes Relative 2 %   Eosinophils Relative 0 %   Basophils Relative 0 %   Neutro Abs 9.4 (H) 1.7 - 7.7 K/uL   Lymphs Abs 22.4 (H) 0.7 - 4.0 K/uL   Monocytes Absolute 0.6 0.1 - 1.0 K/uL   Eosinophils Absolute 0.0 0.0 - 0.7 K/uL   Basophils Absolute 0.0 0.0 - 0.1 K/uL   RBC Morphology ELLIPTOCYTES     Comment: TARGET CELLS   WBC Morphology ATYPICAL LYMPHOCYTES     Comment: MILD LEFT SHIFT (1-5%  METAS, OCC MYELO, OCC BANDS)  CK     Status: None   Collection Time: 10/09/15 10:56 AM  Result Value Ref Range   Total CK 95 49 - 397 U/L  Protime-INR     Status: Abnormal   Collection Time: 10/09/15 10:56 AM  Result Value Ref Range   Prothrombin Time 15.7 (H) 11.6 - 15.2 seconds   INR 1.24 0.00 - 1.49  APTT     Status: None   Collection Time: 10/09/15 10:56 AM  Result Value Ref Range   aPTT 27 24 - 37 seconds  C-reactive protein     Status: Abnormal   Collection Time: 10/09/15 10:56 AM  Result Value Ref Range   CRP 5.1 (H) <1.0 mg/dL  Sedimentation rate     Status: None   Collection Time: 10/09/15 10:56 AM  Result Value Ref Range   Sed Rate 12 0 - 16 mm/hr  I-Stat Chem 8, ED     Status: Abnormal   Collection Time: 10/09/15 11:31 AM  Result Value Ref Range   Sodium 141 135 - 145 mmol/L   Potassium 4.2 3.5 - 5.1 mmol/L   Chloride 105 101 - 111 mmol/L   BUN 15 6 - 20 mg/dL   Creatinine, Ser 0.60 (L) 0.61 - 1.24 mg/dL   Glucose, Bld 153 (H) 65 - 99 mg/dL   Calcium, Ion 1.04 (L) 1.12 - 1.23 mmol/L   TCO2 24 0 - 100 mmol/L   Hemoglobin 12.9 (L) 13.0 - 17.0 g/dL   HCT 38.0 (L) 39.0 - 52.0 %  Urinalysis, Routine w reflex microscopic (not at [facility])     Status: Abnormal   Collection Time: 10/09/15  2:03 PM  Result Value Ref Range   Color, Urine YELLOW YELLOW   APPearance CLEAR CLEAR   Specific Gravity, Urine 1.026 1.005 - 1.030   pH 7.5 5.0 - 8.0   Glucose, UA NEGATIVE NEGATIVE mg/dL   Hgb urine dipstick SMALL (A) NEGATIVE   Bilirubin Urine NEGATIVE NEGATIVE   Ketones, ur NEGATIVE NEGATIVE mg/dL   Protein, ur NEGATIVE NEGATIVE mg/dL   Nitrite NEGATIVE NEGATIVE  Leukocytes, UA NEGATIVE NEGATIVE  Urine microscopic-add on     Status: Abnormal   Collection Time: 10/09/15  2:03 PM  Result Value Ref Range   Squamous Epithelial / LPF 0-5 (A) NONE SEEN   WBC, UA 0-5 0 - 5 WBC/hpf   RBC / HPF 6-30 0 - 5 RBC/hpf   Bacteria, UA NONE SEEN NONE SEEN  Basic metabolic panel      Status: Abnormal   Collection Time: 10/10/15  5:14 AM  Result Value Ref Range   Sodium 138 135 - 145 mmol/L   Potassium 4.1 3.5 - 5.1 mmol/L   Chloride 106 101 - 111 mmol/L   CO2 27 22 - 32 mmol/L   Glucose, Bld 134 (H) 65 - 99 mg/dL   BUN 8 6 - 20 mg/dL   Creatinine, Ser 0.71 0.61 - 1.24 mg/dL   Calcium 7.8 (L) 8.9 - 10.3 mg/dL   GFR calc non Af Amer >60 >60 mL/min   GFR calc Af Amer >60 >60 mL/min    Comment: (NOTE) The eGFR has been calculated using the CKD EPI equation. This calculation has not been validated in all clinical situations. eGFR's persistently <60 mL/min signify possible Chronic Kidney Disease.    Anion gap 5 5 - 15  CBC     Status: Abnormal   Collection Time: 10/10/15  5:14 AM  Result Value Ref Range   WBC 32.6 (H) 4.0 - 10.5 K/uL   RBC 4.42 4.22 - 5.81 MIL/uL   Hemoglobin 11.0 (L) 13.0 - 17.0 g/dL   HCT 36.7 (L) 39.0 - 52.0 %   MCV 83.0 78.0 - 100.0 fL   MCH 24.9 (L) 26.0 - 34.0 pg   MCHC 30.0 30.0 - 36.0 g/dL   RDW 20.7 (H) 11.5 - 15.5 %   Platelets 126 (L) 150 - 400 K/uL    Imaging / Studies: Ct Abdomen Pelvis W Contrast  10/09/2015  CLINICAL DATA:  Fever.  Swelling inferior to the umbilicus. EXAM: CT ABDOMEN AND PELVIS WITH CONTRAST TECHNIQUE: Multidetector CT imaging of the abdomen and pelvis was performed using the standard protocol following bolus administration of intravenous contrast. CONTRAST:  1 ISOVUE-300 IOPAMIDOL (ISOVUE-300) INJECTION 61% COMPARISON:  April 26, 2015 CT scan FINDINGS: Normal lung bases. No free air or free fluid. There is a periumbilical fat containing hernia, unchanged since December of 2016. There is skin thickening anteriorly, inferior to the umbilicus. Scarring in the left lower pelvis is seen at a site of previous hernia repair. No evidence of repair failure in this region. There is a punctate stone in the right kidney on coronal image 124 and a 2 mm stone in the left kidney on coronal image 111. No hydronephrosis or  perinephric stranding. There is a probable cyst in the right kidney on coronal image 144, too small to characterize and probably not changed in the interval. A second similar probable cyst is seen on coronal image 148 with a tiny stone seen in the right kidney on the same image. No ureterectasis or ureteral stones. The abdominal aorta is normal in caliber with mild atherosclerotic change. The gallbladder is distended but otherwise unremarkable. The liver is otherwise unremarkable. The spleen measures 15 cm in cranial caudal dimension which is unchanged. The adrenal glands and pancreas are normal. No adenopathy. The colon and appendix are normal. The stomach and small bowel are unremarkable. The pelvis demonstrates no adenopathy or mass. The bladder is normal. The patient is status post bilateral hip replacements obscuring  portions of the pelvis but no adenopathy or suspicious masses seen. Degenerative changes are seen in the lower lumbar spine. No other acute bony abnormalities. IMPRESSION: 1. Fat containing periumbilical hernia, unchanged since December 2016 2. Skin thickening inferior to the umbilicus is relatively diffuse and likely represents cellulitis and/or panniculitis. 3. Mildly enlarged spleen, unchanged. 4. Scarring in the left lower pelvis consistent with site of previous hernia repair. There are a few reactive nodes in this region. 5. Nonobstructive stones in the kidneys. Electronically Signed   By: Dorise Bullion III M.D   On: 10/09/2015 13:04    Medications / Allergies:  Scheduled Meds: . aspirin EC  81 mg Oral Daily  . enoxaparin (LOVENOX) injection  40 mg Subcutaneous Q24H  . piperacillin-tazobactam (ZOSYN)  IV  3.375 g Intravenous Q8H  . sodium chloride flush  3 mL Intravenous Q12H  . vancomycin  1,500 mg Intravenous Q12H   Continuous Infusions:  PRN Meds:.acetaminophen **OR** acetaminophen, oxyCODONE  Antibiotics: Anti-infectives    Start     Dose/Rate Route Frequency Ordered Stop    10/09/15 2300  vancomycin (VANCOCIN) 1,500 mg in sodium chloride 0.9 % 500 mL IVPB     1,500 mg 250 mL/hr over 120 Minutes Intravenous Every 12 hours 10/09/15 1258     10/09/15 1800  piperacillin-tazobactam (ZOSYN) IVPB 3.375 g     3.375 g 12.5 mL/hr over 240 Minutes Intravenous Every 8 hours 10/09/15 1258     10/09/15 1015  piperacillin-tazobactam (ZOSYN) IVPB 3.375 g     3.375 g 100 mL/hr over 30 Minutes Intravenous  Once 10/09/15 1008 10/09/15 1121   10/09/15 1015  vancomycin (VANCOCIN) IVPB 1000 mg/200 mL premix  Status:  Discontinued     1,000 mg 200 mL/hr over 60 Minutes Intravenous  Once 10/09/15 1008 10/09/15 1013   10/09/15 1015  vancomycin (VANCOCIN) 2,500 mg in sodium chloride 0.9 % 500 mL IVPB     2,500 mg 250 mL/hr over 120 Minutes Intravenous  Once 10/09/15 1013 10/09/15 1515        Assessment/Plan Right abdominal wall cellulitis-continue IV antibiotics, watch for developing abscess. Daily dry dressing change to abdominal wall wound  ID-Vanc and Zosyn FEN-no issues  Dispo-IV antibiotics    Erby Pian, ANP-BC [facility]   10/10/2015 9:21 AM

## 2015-10-10 NOTE — Progress Notes (Signed)
Family Medicine Teaching Service Daily Progress Note Intern Pager: [phone]  Patient name: Henry Hinton Medical record number: [id number] Date of birth: 07-Sep-1960 Age: 55 y.o. Gender: male  Primary Care Provider: [name], MD Consultants: Surgery Code Status:FULL  Pt Overview and Major Events to Date:  6/10: admitted for cellulitis/panniculitis: started IV Vanc and Zosyn   Assessment and Plan: Henry Hinton is a 55 y.o. male presenting with abdominal pain. PMH is significant for morbid obesity, CLL, symptomatic kidney stones, and history of DVT (completed Xarelto therapy), and left inguinal hernia repair with complication of incisional hematoma.   Abdominal Cellulitis:  CT was reassuring and was consistent with a cellulitis/panniculitis. Afebrile overnight with tmax of 100F. WBC stable. New mild erythema that crosses drawn border.  - will continue IV Vancomycin and Zosyn until clinical improvement  - Blood cultures: NG < 12 hours - Urine culture obtained - Monitor for spread  - Surgery will continue to follow. Appreciate recommendations: continue IV abx - Tylenol prn for mild/moderate pain; oxycodone IR 5 mg prn for severe pain.  - discontinue IVF  CLL: WBC baseline is 22-28. Goes to [facility] and last draw was May. Gets labs drawn every three months.  - Will monitor CBCs while hospitalized.   OSA: Uses CPAP on nightly basis  - A friend of his mother will bring the machine to the hospital   Healing Left Inguinal Hernia Wound: Had been using wound vac until April. Now doing dry dressings. Is not involved in periumbilical erythema.  - Surgery: daily dry dressing change   Nephrolithiasis: goes to [facility] and was last treated last year. Has been told his trigger is salt. CT abdomen showed punctate stone in the right kidney and 2 mm stone in left kidney but no hydronephrosis or stranding. Small blood on  UA.   FEN/GI: Heart healthy diet PPx: Lovenox  Disposition: pending  improvement  Subjective:  No acute events overnight. Reports pain has improved; now only has discomfort with palpation of the area.   Objective: Temp:  [98.5 F (36.9 C)-102.9 F (39.4 C)] 98.6 F (37 C) (06/11 0417) Pulse Rate:  [66-103] 66 (06/11 0417) Resp:  [16-28] 18 (06/11 0417) BP: (100-171)/(42-93) 110/49 mmHg (06/11 0417) SpO2:  [90 %-100 %] 99 % (06/11 0417) Weight:  [178.717 kg (394 lb)] 178.717 kg (394 lb) (06/10 1600) Physical Exam: General: NAD, laying in bed Cardiovascular: RRR, no m/r/g Respiratory: normal wob, CTAB Abdomen: BS+, tender to palpation of the erythematous area, on the Left lower area of erythematous area with healing wound that is about 5cm in diameter with granulation tissue and minimal white drainage. Mild erythema that crossed the demarcated border on the left upper area and right lower area.  Extremities: chronic venous stasis changes noted on skin of LE     Laboratory:  Recent Labs Lab 10/09/15 1056 10/09/15 1131 10/10/15 0514  WBC 32.4*  --  32.6*  HGB 11.6* 12.9* 11.0*  HCT 37.6* 38.0* 36.7*  PLT 165  --  126*    Recent Labs Lab 10/09/15 1056 10/09/15 1131 10/10/15 0514  NA 137 141 138  K 4.3 4.2 4.1  CL 109 105 106  CO2 24  --  27  BUN 13 15 8   CREATININE 0.69 0.60* 0.71  CALCIUM 7.9*  --  7.8*  PROT 6.5  --   --   BILITOT 0.6  --   --   ALKPHOS 91  --   --   ALT 18  --   --  AST 20  --   --   GLUCOSE 158* 153* 134*  Blood cx: NG < 12 hours   Imaging/Diagnostic Tests: IMPRESSION: 1. Fat containing periumbilical hernia, unchanged since December 2016 2. Skin thickening inferior to the umbilicus is relatively diffuse and likely represents cellulitis and/or panniculitis. 3. Mildly enlarged spleen, unchanged. 4. Scarring in the left lower pelvis consistent with site of previous hernia repair. There are a few reactive nodes in this region. 5. Nonobstructive stones in the kidneys.  Henry Houseman,  MD 10/10/2015, 6:56 AM PGY-1, [place] Intern pager: [phone], text pages welcome

## 2015-10-11 ENCOUNTER — Inpatient Hospital Stay (HOSPITAL_COMMUNITY): Payer: BC Managed Care – PPO

## 2015-10-11 DIAGNOSIS — Z86718 Personal history of other venous thrombosis and embolism: Secondary | ICD-10-CM

## 2015-10-11 DIAGNOSIS — A419 Sepsis, unspecified organism: Secondary | ICD-10-CM

## 2015-10-11 LAB — BASIC METABOLIC PANEL
ANION GAP: 7 (ref 5–15)
BUN: 6 mg/dL (ref 6–20)
CALCIUM: 7.8 mg/dL — AB (ref 8.9–10.3)
CO2: 27 mmol/L (ref 22–32)
Chloride: 104 mmol/L (ref 101–111)
Creatinine, Ser: 0.63 mg/dL (ref 0.61–1.24)
GFR calc Af Amer: >60 mL/min (ref >60)
GLUCOSE: 137 mg/dL — AB (ref 65–99)
Potassium: 3.7 mmol/L (ref 3.5–5.1)
Sodium: 138 mmol/L (ref 135–145)

## 2015-10-11 LAB — CBC
HCT: 34.4 % — ABNORMAL LOW (ref 39.0–52.0)
Hemoglobin: 10.3 g/dL — ABNORMAL LOW (ref 13.0–17.0)
MCH: 24.5 pg — ABNORMAL LOW (ref 26.0–34.0)
MCHC: 29.9 g/dL — ABNORMAL LOW (ref 30.0–36.0)
MCV: 81.9 fL (ref 78.0–100.0)
PLATELETS: 120 10*3/uL — AB (ref 150–400)
RBC: 4.2 MIL/uL — ABNORMAL LOW (ref 4.22–5.81)
RDW: 20.4 % — AB (ref 11.5–15.5)
WBC: 24.3 10*3/uL — AB (ref 4.0–10.5)

## 2015-10-11 LAB — GLUCOSE, CAPILLARY
GLUCOSE-CAPILLARY: 138 mg/dL — AB (ref 65–99)
Glucose-Capillary: 133 mg/dL — ABNORMAL HIGH (ref 65–99)
Glucose-Capillary: 151 mg/dL — ABNORMAL HIGH (ref 65–99)

## 2015-10-11 LAB — VANCOMYCIN, TROUGH: VANCOMYCIN TR: 10 ug/mL (ref 10.0–20.0)

## 2015-10-11 LAB — PATHOLOGIST SMEAR REVIEW

## 2015-10-11 MED ORDER — RIVAROXABAN 20 MG PO TABS: 20.0000 mg | ORAL_TABLET | Freq: Every day | ORAL | Status: DC

## 2015-10-11 MED ORDER — RIVAROXABAN 15 MG PO TABS
15.0000 mg | ORAL_TABLET | Freq: Two times a day (BID) | ORAL | Status: DC
  Filled 2015-10-11: qty 1

## 2015-10-11 MED ORDER — VANCOMYCIN HCL 10 G IV SOLR
1500.0000 mg | Freq: Three times a day (TID) | INTRAVENOUS | Status: DC
  Administered 2015-10-12: 1500 mg via INTRAVENOUS
  Filled 2015-10-11 (×3): qty 1500

## 2015-10-11 NOTE — Progress Notes (Signed)
Family Medicine Teaching Service Daily Progress Note Intern Pager: [phone]  Patient name: Henry Hinton Medical record number: [id number] Date of birth: 10-14-1960 Age: 55 y.o. Gender: male  Primary Care Provider: [name], MD Consultants: Surgery Code Status:FULL  Pt Overview and Major Events to Date:  6/10: admitted for cellulitis/panniculitis: started IV Vanc and Zosyn   Assessment and Plan: Henry Hinton is a 55 y.o. male presenting with abdominal pain. PMH is significant for morbid obesity, CLL, symptomatic kidney stones, and history of DVT (completed Xarelto therapy), and left inguinal hernia repair with complication of incisional hematoma.   Abdominal Cellulitis:  CT was reassuring and was consistent with a cellulitis/panniculitis. Afebrile > 1 day. WBC improved. Erythema stable but not improved.   - will continue IV Vancomycin and Zosyn today, then plan to transition to PO antibiotic that covers group B strep with clinical improvement  - Blood cultures: NG x 1 day - Urine culture with insignificant growth - Monitor for spread  - Surgery will continue to follow. Appreciate recommendations: continue abx - Tylenol prn for mild/moderate pain; oxycodone IR 5 mg prn for severe pain (has not required)  CLL: WBC baseline is 22-28. Goes to [facility] and last draw was May. Gets labs drawn every three months. - 32.6 > 24.3  - Will monitor CBCs while hospitalized.   OSA: Uses CPAP on nightly basis  - Home machine brought to hospital  Healing Left Inguinal Hernia Wound: Had been using wound vac until April. Now doing dry dressings. Is not involved in periumbilical erythema.  - Surgery: daily dry dressing change   Nephrolithiasis: goes to [facility] and was last treated last year. Has been told his trigger is salt. CT abdomen showed punctate stone in the right kidney and 2 mm stone in left kidney but no hydronephrosis or stranding. Small blood on  UA.   History of R DVT: Denies leg  pain but TTP on exam with cords. - Obtain LE dopplers.   FEN/GI: Heart healthy diet PPx: Lovenox  Disposition: pending improvement  Subjective:  No acute events overnight. Reports pain has improved; no longer has pain with palpation of abdomen. Appetite improved with IVFs off.    Objective: Temp:  [98.2 F (36.8 C)-99.2 F (37.3 C)] 98.2 F (36.8 C) (06/12 0447) Pulse Rate:  [64-84] 67 (06/12 0447) Resp:  [18-19] 18 (06/12 0447) BP: (111-147)/(49-68) 132/68 mmHg (06/12 0447) SpO2:  [79 %-100 %] 100 % (06/12 0447) Physical Exam: General: NAD, well-appearing, sitting up in bed eating breakfast Cardiovascular: RRR, no m/r/g Respiratory: normal wob, CTAB Abdomen: BS+, no longer TTP over abdomen, erythematous area increased from yesterday and shifted more to the left from the right but spread from initial boundaries stable from yesterday, healing wound that is about 5cm in diameter with granulation tissue and minimal clear drainage. Mild erythema that crossed the demarcated border on the left upper area and right lower area stable.  Extremities: chronic venous stasis changes noted on skin of LE. Cords felt in left calf and right lower leg.      Laboratory:  Recent Labs Lab 10/09/15 1056 10/09/15 1131 10/10/15 0514 10/11/15 0526  WBC 32.4*  --  32.6* 24.3*  HGB 11.6* 12.9* 11.0* 10.3*  HCT 37.6* 38.0* 36.7* 34.4*  PLT 165  --  126* 120*    Recent Labs Lab 10/09/15 1056 10/09/15 1131 10/10/15 0514 10/11/15 0526  NA 137 141 138 138  K 4.3 4.2 4.1 3.7  CL 109 105 106 104  CO2 24  --  27 27  BUN 13 15 8 6   CREATININE 0.69 0.60* 0.71 0.63  CALCIUM 7.9*  --  7.8* 7.8*  PROT 6.5  --   --   --   BILITOT 0.6  --   --   --   ALKPHOS 91  --   --   --   ALT 18  --   --   --   AST 20  --   --   --   GLUCOSE 158* 153* 134* 137*  Blood cx: NG 1 day  Imaging/Diagnostic Tests: IMPRESSION: 1. Fat containing periumbilical hernia, unchanged since December 2016 2. Skin  thickening inferior to the umbilicus is relatively diffuse and likely represents cellulitis and/or panniculitis. 3. Mildly enlarged spleen, unchanged. 4. Scarring in the left lower pelvis consistent with site of previous hernia repair. There are a few reactive nodes in this region. 5. Nonobstructive stones in the kidneys.  Rogue Bussing, MD 10/11/2015, 7:22 AM PGY-1, [place] Intern pager: [phone], text pages welcome

## 2015-10-11 NOTE — Progress Notes (Addendum)
ANTICOAGULATION CONSULT NOTE - Initial Consult  Pharmacy Consult for xarelt  Indication: DVT  Allergies  Allergen Reactions  . Adhesive [Tape] Other (See Comments)    Blisters and rash at site    Patient Measurements: Height: 5\' 9"  (175.3 cm) Weight: (!) 394 lb (178.717 kg) IBW/kg (Calculated) : 70.7   Vital Signs: Temp: 98.1 F (36.7 C) (06/12 1411) BP: 136/61 mmHg (06/12 1411) Pulse Rate: 72 (06/12 1411)  Labs:  Recent Labs  10/09/15 1056 10/09/15 1131 10/10/15 0514 10/11/15 0526  HGB 11.6* 12.9* 11.0* 10.3*  HCT 37.6* 38.0* 36.7* 34.4*  PLT 165  --  126* 120*  APTT 27  --   --   --   LABPROT 15.7*  --   --   --   INR 1.24  --   --   --   CREATININE 0.69 0.60* 0.71 0.63  CKTOTAL 95  --   --   --     Estimated Creatinine Clearance: 168.1 mL/min (by C-G formula based on Cr of 0.63).   Medical History: Past Medical History  Diagnosis Date  . Arthritis   . Numbness and tingling     Hx: of in right arm  . Heart murmur     Hx; of as a child  . Joint pain     Hx: of periodically  . Sleep apnea   . Pneumonia   . Kidney stones     Hx; of as a child  . Anemia   . cll 11-2014    CLL - 11/30/2014  . Family history of anesthesia complication     " father having delirium after anesthesia"  . DVT (deep venous thrombosis) ([facility]) 6/16    Rt calf  took xarelto for 6 months    Medications:  Prescriptions prior to admission  Medication Sig Dispense Refill Last Dose  . aspirin EC 81 MG tablet Take 81 mg by mouth daily.   10/08/2015 at Unknown time  . ferrous sulfate (IRON SUPPLEMENT) 325 (65 FE) MG tablet Take 325 mg by mouth daily with breakfast.   10/08/2015 at Unknown time  . furosemide (LASIX) 40 MG tablet Take 1 tablet (40 mg total) by mouth daily. 90 tablet 2 10/08/2015 at 1400  . hydrocortisone cream 1 % Apply 1 application topically 2 (two) times daily as needed for itching.   10/08/2015 at Unknown time  . Multiple Vitamins-Minerals (MULTIVITAMIN WITH MINERALS)  tablet Take 1 tablet by mouth daily.   10/08/2015 at Unknown time  . potassium chloride (KLOR-CON 10) 10 MEQ tablet TAKE 1 TABLET (10 MEQ TOTAL) BY MOUTH DAILY. 90 tablet 2 10/08/2015   Scheduled:  . aspirin EC  81 mg Oral Daily  . piperacillin-tazobactam (ZOSYN)  IV  3.375 g Intravenous Q8H  . sodium chloride flush  3 mL Intravenous Q12H  . vancomycin  1,500 mg Intravenous Q12H    Assessment: 55 yo male with superficial thrombosis of the greater saphenous vein (and noted with history on DVT in 2016). Pharmacy consulted to dose Xarelto. -Hg= 10.3, plt= 120 (trend down)   Goal of Therapy:  Monitor platelets by anticoagulation protocol: Yes   Plan:  -Xarelto 15mg  po bid for 21 days followed by 20mg  po daily -Will count 6/13 as the first day of therapy to make the transition easier -Consider discontinuing aspirin? -Will provide education  Hildred Laser, Pharm D 10/11/2015 4:57 PM

## 2015-10-11 NOTE — Progress Notes (Signed)
Subjective: Feeling better  Objective: Vital signs in last 24 hours: Temp:  [98.2 F (36.8 C)-99.2 F (37.3 C)] 98.2 F (36.8 C) (06/12 0447) Pulse Rate:  [64-84] 67 (06/12 0447) Resp:  [18-19] 18 (06/12 0447) BP: (111-147)/(49-68) 132/68 mmHg (06/12 0447) SpO2:  [79 %-100 %] 100 % (06/12 0447) Last BM Date: 10/08/15  Intake/Output from previous day: 06/11 0701 - 06/12 0700 In: 1220 [P.O.:120; IV Piggyback:1100] Out: -  Intake/Output this shift:    Resp: clear to auscultation bilaterally Cardio: regular rate and rhythm GI: redness centered below umbilicus and not near healing groin wound. groin wound is small and clean and almost completely healed  Lab Results:   Recent Labs  10/10/15 0514 10/11/15 0526  WBC 32.6* 24.3*  HGB 11.0* 10.3*  HCT 36.7* 34.4*  PLT 126* 120*   BMET  Recent Labs  10/10/15 0514 10/11/15 0526  NA 138 138  K 4.1 3.7  CL 106 104  CO2 27 27  GLUCOSE 134* 137*  BUN 8 6  CREATININE 0.71 0.63  CALCIUM 7.8* 7.8*   PT/INR  Recent Labs  10/09/15 1056  LABPROT 15.7*  INR 1.24   ABG No results for input(s): PHART, HCO3 in the last 72 hours.  Invalid input(s): PCO2, PO2  Studies/Results: Ct Abdomen Pelvis W Contrast  10/09/2015  CLINICAL DATA:  Fever.  Swelling inferior to the umbilicus. EXAM: CT ABDOMEN AND PELVIS WITH CONTRAST TECHNIQUE: Multidetector CT imaging of the abdomen and pelvis was performed using the standard protocol following bolus administration of intravenous contrast. CONTRAST:  1 ISOVUE-300 IOPAMIDOL (ISOVUE-300) INJECTION 61% COMPARISON:  April 26, 2015 CT scan FINDINGS: Normal lung bases. No free air or free fluid. There is a periumbilical fat containing hernia, unchanged since December of 2016. There is skin thickening anteriorly, inferior to the umbilicus. Scarring in the left lower pelvis is seen at a site of previous hernia repair. No evidence of repair failure in this region. There is a punctate stone in  the right kidney on coronal image 124 and a 2 mm stone in the left kidney on coronal image 111. No hydronephrosis or perinephric stranding. There is a probable cyst in the right kidney on coronal image 144, too small to characterize and probably not changed in the interval. A second similar probable cyst is seen on coronal image 148 with a tiny stone seen in the right kidney on the same image. No ureterectasis or ureteral stones. The abdominal aorta is normal in caliber with mild atherosclerotic change. The gallbladder is distended but otherwise unremarkable. The liver is otherwise unremarkable. The spleen measures 15 cm in cranial caudal dimension which is unchanged. The adrenal glands and pancreas are normal. No adenopathy. The colon and appendix are normal. The stomach and small bowel are unremarkable. The pelvis demonstrates no adenopathy or mass. The bladder is normal. The patient is status post bilateral hip replacements obscuring portions of the pelvis but no adenopathy or suspicious masses seen. Degenerative changes are seen in the lower lumbar spine. No other acute bony abnormalities. IMPRESSION: 1. Fat containing periumbilical hernia, unchanged since December 2016 2. Skin thickening inferior to the umbilicus is relatively diffuse and likely represents cellulitis and/or panniculitis. 3. Mildly enlarged spleen, unchanged. 4. Scarring in the left lower pelvis consistent with site of previous hernia repair. There are a few reactive nodes in this region. 5. Nonobstructive stones in the kidneys. Electronically Signed   By: Dorise Bullion III M.D   On: 10/09/2015 13:04  Anti-infectives: Anti-infectives    Start     Dose/Rate Route Frequency Ordered Stop   10/09/15 2300  vancomycin (VANCOCIN) 1,500 mg in sodium chloride 0.9 % 500 mL IVPB     1,500 mg 250 mL/hr over 120 Minutes Intravenous Every 12 hours 10/09/15 1258     10/09/15 1800  piperacillin-tazobactam (ZOSYN) IVPB 3.375 g     3.375 g 12.5  mL/hr over 240 Minutes Intravenous Every 8 hours 10/09/15 1258     10/09/15 1015  piperacillin-tazobactam (ZOSYN) IVPB 3.375 g     3.375 g 100 mL/hr over 30 Minutes Intravenous  Once 10/09/15 1008 10/09/15 1121   10/09/15 1015  vancomycin (VANCOCIN) IVPB 1000 mg/200 mL premix  Status:  Discontinued     1,000 mg 200 mL/hr over 60 Minutes Intravenous  Once 10/09/15 1008 10/09/15 1013   10/09/15 1015  vancomycin (VANCOCIN) 2,500 mg in sodium chloride 0.9 % 500 mL IVPB     2,500 mg 250 mL/hr over 120 Minutes Intravenous  Once 10/09/15 1013 10/09/15 1515      Assessment/Plan: s/p * No surgery found * Continue abx for panniculitis. CT did not show any fluid near hernia repair and the inflammation in soft tissue does not seem to be emanating from the repair Will follow  LOS: 2 days    TOTH III,Blain Hunsucker S 10/11/2015

## 2015-10-11 NOTE — Progress Notes (Signed)
Pharmacy Antibiotic Note  Henry Hinton is a 55 y.o. male admitted on 10/09/2015 with cellulitis/panniculitis of left inguinal hernia surgical site.  Pharmacy has been consulted for Vancomycin.   Plan: Change Vancomycin 1500 mg IV q8h  Height: 5\' 9"  (175.3 cm) Weight: (!) 394 lb (178.717 kg) IBW/kg (Calculated) : 70.7  Temp (24hrs), Avg:98.9 F (37.2 C), Min:98.1 F (36.7 C), Max:100.2 F (37.9 C)   Recent Labs Lab 10/09/15 1039 10/09/15 1056 10/09/15 1131 10/10/15 0514 10/11/15 0526 10/11/15 2219  WBC  --  32.4*  --  32.6* 24.3*  --   CREATININE  --  0.69 0.60* 0.71 0.63  --   LATICACIDVEN 1.22  --   --   --   --   --   VANCOTROUGH  --   --   --   --   --  10    Estimated Creatinine Clearance: 168.1 mL/min (by C-G formula based on Cr of 0.63).    Allergies  Allergen Reactions  . Adhesive [Tape] Other (See Comments)    Blisters and rash at site    Antimicrobials this admission: 6/10 Vanc>> 6/10 Zosyn>>   Microbiology results: 6/10 BCx2>> 6/10 UCx>>   Phillis Knack, PharmD, BCPS

## 2015-10-11 NOTE — Progress Notes (Signed)
VASCULAR LAB PRELIMINARY  PRELIMINARY  PRELIMINARY  PRELIMINARY  Bilateral lower extremity venous duplex completed.    Preliminary report:  Bilateral - negative for deep vein thrombosis. Positive for superficial thrombosis of the greater saphenous vein. No evidence of Baker's cyst  Nolawi Kanady, RVS 10/11/2015, 2:02 PM

## 2015-10-11 NOTE — Care Management Note (Signed)
Case Management Note  Patient Details  Name: Chino Feltz MRN: [id number] Date of Birth: 1960/10/21  Subjective/Objective:                 Patient from home with spouse, admitted with sepsis cellulitis of pannus/ area of abdominal hematoma s/p hernia repair. IV Abx.    Action/Plan:  CM will continue to follow for DC planning needs. Expected Discharge Date:                  Expected Discharge Plan:  [place]  In-House Referral:     Discharge planning Services  CM Consult  Post Acute Care Choice:    Choice offered to:     DME Arranged:    DME Agency:     HH Arranged:    [place] Agency:     Status of Service:  In process, will continue to follow  Medicare Important Message Given:    Date Medicare IM Given:    Medicare IM give by:    Date Additional Medicare IM Given:    Additional Medicare Important Message give by:     If discussed at [place] of Stay Meetings, dates discussed:    Additional Comments:  Carles Collet, RN 10/11/2015, 3:03 PM

## 2015-10-11 NOTE — Progress Notes (Signed)
Pt has home CPAP with nasal pillows and tubing. Pt places self on and off. RT made pt aware that if he needed anything to call.

## 2015-10-11 NOTE — Progress Notes (Signed)
Attempted to give xaralto to pt. Pt refused stating he has been off of this med since January and stated that he would take medication if PCP prescribed.

## 2015-10-12 LAB — BASIC METABOLIC PANEL
Anion gap: 5 (ref 5–15)
BUN: 6 mg/dL (ref 6–20)
CALCIUM: 8.1 mg/dL — AB (ref 8.9–10.3)
CO2: 28 mmol/L (ref 22–32)
CREATININE: 0.66 mg/dL (ref 0.61–1.24)
Chloride: 108 mmol/L (ref 101–111)
GFR calc Af Amer: >60 mL/min (ref >60)
Glucose, Bld: 133 mg/dL — ABNORMAL HIGH (ref 65–99)
Potassium: 4.5 mmol/L (ref 3.5–5.1)
Sodium: 141 mmol/L (ref 135–145)

## 2015-10-12 LAB — CBC
HCT: 33.8 % — ABNORMAL LOW (ref 39.0–52.0)
Hemoglobin: 10.3 g/dL — ABNORMAL LOW (ref 13.0–17.0)
MCH: 25 pg — AB (ref 26.0–34.0)
MCHC: 30.5 g/dL (ref 30.0–36.0)
MCV: 82 fL (ref 78.0–100.0)
PLATELETS: 142 10*3/uL — AB (ref 150–400)
RBC: 4.12 MIL/uL — AB (ref 4.22–5.81)
RDW: 20.2 % — ABNORMAL HIGH (ref 11.5–15.5)
WBC: 28 10*3/uL — ABNORMAL HIGH (ref 4.0–10.5)

## 2015-10-12 MED ORDER — ENOXAPARIN SODIUM 40 MG/0.4ML ~~LOC~~ SOLN: 40.0000 mg | SUBCUTANEOUS | Status: DC

## 2015-10-12 MED ORDER — ENOXAPARIN SODIUM 100 MG/ML ~~LOC~~ SOLN
90.0000 mg | SUBCUTANEOUS | Status: DC
  Administered 2015-10-12 – 2015-10-13 (×2): 90 mg via SUBCUTANEOUS
  Filled 2015-10-12 (×2): qty 1

## 2015-10-12 MED ORDER — POLYETHYLENE GLYCOL 3350 17 G PO PACK
17.0000 g | PACK | Freq: Once | ORAL | Status: AC
  Administered 2015-10-12: 17 g via ORAL
  Filled 2015-10-12: qty 1

## 2015-10-12 MED ORDER — POTASSIUM CHLORIDE CRYS ER 10 MEQ PO TBCR
10.0000 meq | EXTENDED_RELEASE_TABLET | Freq: Every day | ORAL | Status: DC
  Administered 2015-10-12 – 2015-10-14 (×3): 10 meq via ORAL
  Filled 2015-10-12 (×5): qty 1

## 2015-10-12 MED ORDER — FUROSEMIDE 40 MG PO TABS
40.0000 mg | ORAL_TABLET | Freq: Every day | ORAL | Status: DC
  Administered 2015-10-12 – 2015-10-14 (×3): 40 mg via ORAL
  Filled 2015-10-12 (×3): qty 1

## 2015-10-12 MED ORDER — CEPHALEXIN 500 MG PO CAPS
500.0000 mg | ORAL_CAPSULE | Freq: Four times a day (QID) | ORAL | Status: DC
  Administered 2015-10-12 – 2015-10-13 (×4): 500 mg via ORAL
  Filled 2015-10-12 (×4): qty 1

## 2015-10-12 NOTE — Progress Notes (Signed)
Ok to D/C continuous pulse ox order, per MD.

## 2015-10-12 NOTE — Care Management Note (Signed)
Case Management Note  Patient Details  Name: Babu Gohn MRN: [id number] Date of Birth: 1960/07/04  Subjective/Objective:                 Patient from home with mother, admitted with sepsis cellulitis of pannus/ area of abdominal hematoma s/p hernia repair. IV Abx. SPoke with patient in the room. Reviewed his plan for cellulitis management at home. Patient verbalized understanding of wound care and declined [place] assistance. His mother is also available to manage wound and help to evaluate site below/ under pannus.    Action/Plan:  No further CM needs identified.   Expected Discharge Date:                  Expected Discharge Plan:  Home/Self Care  In-House Referral:     Discharge planning Services  CM Consult  Post Acute Care Choice:    Choice offered to:     DME Arranged:    DME Agency:     HH Arranged:    [place] Agency:     Status of Service:  Completed, signed off  Medicare Important Message Given:    Date Medicare IM Given:    Medicare IM give by:    Date Additional Medicare IM Given:    Additional Medicare Important Message give by:     If discussed at [place] of Stay Meetings, dates discussed:    Additional Comments:  Carles Collet, RN 10/12/2015, 12:28 PM

## 2015-10-12 NOTE — Progress Notes (Signed)
Pt home CPAP, able to place self on and off.

## 2015-10-12 NOTE — Progress Notes (Signed)
Family Medicine Teaching Service Daily Progress Note Intern Pager: [phone]  Patient name: Henry Hinton Medical record number: [id number] Date of birth: [birth year] Age: 55 y.o. Gender: male  Primary Care Provider: [name], MD Consultants: Surgery Code Status:FULL  Pt Overview and Major Events to Date:  6/10: admitted for cellulitis/panniculitis: started IV Vanc and Zosyn   Assessment and Plan: Seraj Bourgoin is a 55 y.o. male presenting with abdominal pain. PMH is significant for morbid obesity, CLL, symptomatic kidney stones, and history of DVT (completed Xarelto therapy), and left inguinal hernia repair with complication of incisional hematoma.   Abdominal Cellulitis:  Improved in terms of amount of induration and some improvement in degree of erythema. CT was reassuring and was consistent with a cellulitis/panniculitis. Afebrile > 2 days. WBC improved. Erythema stable but not improved.   - will discontinue IV Vancomycin and Zosyn today (Day 4), then transition to PO antibiotic that covers group B strep with clinical improvement (likely keflex 500 mg q6h to complete 2 week course of abx) - Blood cultures: NG x 2 days - Urine culture with insignificant growth - Monitor for spread  - Surgery will continue to follow. Appreciate recommendations: continue abx - Tylenol prn for mild/moderate pain; oxycodone IR 5 mg prn for severe pain (has not required)  CLL: WBC baseline is 22-28. Goes to [facility] and last draw was May. Gets labs drawn every three months. - 32.6 > 24.3 > 28.0 - Will monitor CBCs while hospitalized.   OSA: Uses CPAP on nightly basis  - Home machine brought to hospital  Healing Left Inguinal Hernia Wound: Had been using wound vac until April. Now doing dry dressings. Is not involved in periumbilical erythema.  - Surgery: daily dry dressing change   Nephrolithiasis: goes to [facility] and was last treated last year. Has been told his trigger is salt. CT abdomen showed  punctate stone in the right kidney and 2 mm stone in left kidney but no hydronephrosis or stranding. Small blood on  UA.   History of R DVT: Denies leg pain but TTP on exam with cords. - Preliminary LE dopplers showed only superficial thrombosis  Venous stasis: - Restart home lasix and Kdur  Constipation: Normally goes every 2-3 days. Has mild RLQ TTP.  - Ordered miralax  FEN/GI: Heart healthy diet PPx: Lovenox  Disposition: pending improvement  Subjective:  No acute events overnight. Reports pain and "hardness" of abdomen has improved; no longer has pain with palpation of abdomen. He does have a headache that he says he normally gets with antibiotics and improves with ice and tylenol. He has not had a bowel movement since being admitted and is interested in taking miralax. He also reports increased tightness of legs.   Objective: Temp:  [98.1 F (36.7 C)-100.2 F (37.9 C)] 98.7 F (37.1 C) (06/13 0512) Pulse Rate:  [68-73] 68 (06/13 0512) Resp:  [18] 18 (06/13 0512) BP: (136-141)/(56-67) 137/56 mmHg (06/13 0512) SpO2:  [98 %-100 %] 100 % (06/13 0512) Physical Exam: General: NAD, well-appearing, sitting up in bed Cardiovascular: RRR, no m/r/g Respiratory: normal wob, CTAB Abdomen: BS+, no longer TTP area of erythema -- mild TTP over RLQ, erythematous area stable from yesterday and less red and less firm. Healing LIH wound that is about 5cm in diameter with granulation tissue and minimal clear drainage. No further spread from demarcated region.  Extremities: chronic venous stasis changes noted on skin of LE. 1/2+ edema. Cords felt in left calf and right lower leg.  Laboratory:  Recent Labs Lab 10/10/15 0514 10/11/15 0526 10/12/15 0458  WBC 32.6* 24.3* 28.0*  HGB 11.0* 10.3* 10.3*  HCT 36.7* 34.4* 33.8*  PLT 126* 120* 142*    Recent Labs Lab 10/09/15 1056  10/10/15 0514 10/11/15 0526 10/12/15 0458  NA 137  < > 138 138 141  K 4.3  < > 4.1 3.7 4.5  CL 109   < > 106 104 108  CO2 24  --  27 27 28   BUN 13  < > 8 6 6   CREATININE 0.69  < > 0.71 0.63 0.66  CALCIUM 7.9*  --  7.8* 7.8* 8.1*  PROT 6.5  --   --   --   --   BILITOT 0.6  --   --   --   --   ALKPHOS 91  --   --   --   --   ALT 18  --   --   --   --   AST 20  --   --   --   --   GLUCOSE 158*  < > 134* 137* 133*  < > = values in this interval not displayed.Blood cx: NG 1 day  Imaging/Diagnostic Tests: IMPRESSION: 1. Fat containing periumbilical hernia, unchanged since December 2016 2. Skin thickening inferior to the umbilicus is relatively diffuse and likely represents cellulitis and/or panniculitis. 3. Mildly enlarged spleen, unchanged. 4. Scarring in the left lower pelvis consistent with site of previous hernia repair. There are a few reactive nodes in this region. 5. Nonobstructive stones in the kidneys.  Rogue Bussing, MD 10/12/2015, 12:23 PM PGY-1, [place] Intern pager: [phone], text pages welcome

## 2015-10-12 NOTE — Progress Notes (Signed)
  Subjective: Feeling better but abx give him headaches  Objective: Vital signs in last 24 hours: Temp:  [98.1 F (36.7 C)-100.2 F (37.9 C)] 98.7 F (37.1 C) (06/13 0512) Pulse Rate:  [68-73] 68 (06/13 0512) Resp:  [18] 18 (06/13 0512) BP: (136-141)/(56-67) 137/56 mmHg (06/13 0512) SpO2:  [98 %-100 %] 100 % (06/13 0512) Last BM Date: 10/08/15  Intake/Output from previous day: 06/12 0701 - 06/13 0700 In: 1700 [IV Piggyback:1700] Out: 2275 [Urine:2275] Intake/Output this shift:    Resp: clear to auscultation bilaterally Cardio: regular rate and rhythm GI: soft, minimal tenderness. abdominal wall cellulitis unchanged but feels softer  Lab Results:   Recent Labs  10/11/15 0526 10/12/15 0458  WBC 24.3* 28.0*  HGB 10.3* 10.3*  HCT 34.4* 33.8*  PLT 120* 142*   BMET  Recent Labs  10/11/15 0526 10/12/15 0458  NA 138 141  K 3.7 4.5  CL 104 108  CO2 27 28  GLUCOSE 137* 133*  BUN 6 6  CREATININE 0.63 0.66  CALCIUM 7.8* 8.1*   PT/INR  Recent Labs  10/09/15 1056  LABPROT 15.7*  INR 1.24   ABG No results for input(s): PHART, HCO3 in the last 72 hours.  Invalid input(s): PCO2, PO2  Studies/Results: No results found.  Anti-infectives: Anti-infectives    Start     Dose/Rate Route Frequency Ordered Stop   10/12/15 0600  vancomycin (VANCOCIN) 1,500 mg in sodium chloride 0.9 % 500 mL IVPB     1,500 mg 250 mL/hr over 120 Minutes Intravenous Every 8 hours 10/11/15 2339     10/09/15 2300  vancomycin (VANCOCIN) 1,500 mg in sodium chloride 0.9 % 500 mL IVPB  Status:  Discontinued     1,500 mg 250 mL/hr over 120 Minutes Intravenous Every 12 hours 10/09/15 1258 10/11/15 2339   10/09/15 1800  piperacillin-tazobactam (ZOSYN) IVPB 3.375 g     3.375 g 12.5 mL/hr over 240 Minutes Intravenous Every 8 hours 10/09/15 1258     10/09/15 1015  piperacillin-tazobactam (ZOSYN) IVPB 3.375 g     3.375 g 100 mL/hr over 30 Minutes Intravenous  Once 10/09/15 1008 10/09/15 1121    10/09/15 1015  vancomycin (VANCOCIN) IVPB 1000 mg/200 mL premix  Status:  Discontinued     1,000 mg 200 mL/hr over 60 Minutes Intravenous  Once 10/09/15 1008 10/09/15 1013   10/09/15 1015  vancomycin (VANCOCIN) 2,500 mg in sodium chloride 0.9 % 500 mL IVPB     2,500 mg 250 mL/hr over 120 Minutes Intravenous  Once 10/09/15 1013 10/09/15 1515      Assessment/Plan: s/p * No surgery found * Advance diet  Continue abx until cellulitis resolves Hard to follow wbc given his CLL and baseline wbc of around 28-30,000  LOS: 3 days    TOTH III,PAUL S 10/12/2015

## 2015-10-13 DIAGNOSIS — I878 Other specified disorders of veins: Secondary | ICD-10-CM | POA: Insufficient documentation

## 2015-10-13 LAB — BASIC METABOLIC PANEL
Anion gap: 8 (ref 5–15)
BUN: 8 mg/dL (ref 6–20)
CALCIUM: 8.3 mg/dL — AB (ref 8.9–10.3)
CO2: 29 mmol/L (ref 22–32)
CREATININE: 0.58 mg/dL — AB (ref 0.61–1.24)
Chloride: 103 mmol/L (ref 101–111)
GFR calc Af Amer: >60 mL/min (ref >60)
GLUCOSE: 127 mg/dL — AB (ref 65–99)
Potassium: 3.9 mmol/L (ref 3.5–5.1)
Sodium: 140 mmol/L (ref 135–145)

## 2015-10-13 LAB — CBC
HEMATOCRIT: 34.4 % — AB (ref 39.0–52.0)
Hemoglobin: 10.3 g/dL — ABNORMAL LOW (ref 13.0–17.0)
MCH: 24.6 pg — ABNORMAL LOW (ref 26.0–34.0)
MCHC: 29.9 g/dL — AB (ref 30.0–36.0)
MCV: 82.1 fL (ref 78.0–100.0)
PLATELETS: 170 10*3/uL (ref 150–400)
RBC: 4.19 MIL/uL — ABNORMAL LOW (ref 4.22–5.81)
RDW: 20.1 % — AB (ref 11.5–15.5)
WBC: 25.9 10*3/uL — ABNORMAL HIGH (ref 4.0–10.5)

## 2015-10-13 MED ORDER — POLYETHYLENE GLYCOL 3350 17 G PO PACK
17.0000 g | PACK | Freq: Every day | ORAL | Status: DC
  Administered 2015-10-13 – 2015-10-14 (×2): 17 g via ORAL
  Filled 2015-10-13 (×2): qty 1

## 2015-10-13 MED ORDER — SULFAMETHOXAZOLE-TRIMETHOPRIM 800-160 MG PO TABS
2.0000 | ORAL_TABLET | Freq: Two times a day (BID) | ORAL | Status: DC
  Administered 2015-10-13 – 2015-10-14 (×3): 2 via ORAL
  Filled 2015-10-13 (×2): qty 2
  Filled 2015-10-13 (×2): qty 1

## 2015-10-13 NOTE — Progress Notes (Signed)
Family Medicine Teaching Service Daily Progress Note Intern Pager: [phone]  Patient name: Henry Hinton Medical record number: [id number] Date of birth: 09-Nov-1960 Age: 55 y.o. Gender: male  Primary Care Provider: [name], MD Consultants: Surgery Code Status:FULL  Pt Overview and Major Events to Date:  6/10: admitted for cellulitis/panniculitis: started IV Vanc and Zosyn  6/13: Switched to PO keflex 6/14: switched to bactrim   Assessment and Plan: Henry Hinton is a 55 y.o. male presenting with abdominal pain. PMH is significant for morbid obesity, CLL, symptomatic kidney stones, and history of DVT (completed Xarelto therapy), and left inguinal hernia repair with complication of incisional hematoma.   Abdominal Cellulitis:  Improved in terms of amount of induration and some improvement in degree of erythema but still with significant amount of erythema, especially under pannus. CT was reassuring and was consistent with a cellulitis/panniculitis. Afebrile > 3 days. WBC improved. Erythema stable but not improved.   - Switch to bactrim DS, two tablets BID x at least two weeks - Blood cultures: NG x 3 days - Urine culture with insignificant growth - Monitor for spread  - Surgery will continue to follow. Appreciate recommendations: continue abx - Tylenol prn for mild/moderate pain; oxycodone IR 5 mg prn for severe pain (has not required)  CLL: WBC baseline is 22-28. Goes to [facility] and last draw was May. Gets labs drawn every three months. - 32.6 > 24.3 > 28.0 > 25.9 - Will monitor CBCs while hospitalized.   OSA: Uses CPAP on nightly basis  - Home machine brought to hospital  Healing Left Inguinal Hernia Wound: Had been using wound vac until April. Now doing dry dressings. Is not involved in periumbilical erythema.  - Surgery: daily dry dressing change   Nephrolithiasis: goes to [facility] and was last treated last year. Has been told his trigger is salt. CT abdomen showed  punctate stone in the right kidney and 2 mm stone in left kidney but no hydronephrosis or stranding. Small blood on  UA.   History of R DVT: Denies leg pain but TTP on exam with cords. - Preliminary LE dopplers showed only superficial thrombosis  Venous stasis: - Restart home lasix and Kdur  Constipation: Had BM yesterday. Normally goes every 2-3 days.  - Ordered miralax  FEN/GI: Heart healthy diet PPx: Lovenox  Disposition: Anticipate discharge tomorrow, 6/15, after overnight observation on new PO antibiotic.   Subjective:  No acute events overnight. Says legs feel less tight after restarting lasix.   Objective: Temp:  [98.1 F (36.7 C)-100.2 F (37.9 C)] 98.3 F (36.8 C) (06/14 0459) Pulse Rate:  [64-78] 71 (06/14 0459) Resp:  [18-20] 20 (06/14 0459) BP: (121-141)/(57-72) 121/57 mmHg (06/14 0459) SpO2:  [96 %-100 %] 98 % (06/14 0459) Physical Exam: General: NAD, well-appearing, sitting up in bed Cardiovascular: RRR, no m/r/g Respiratory: normal wob, CTAB Abdomen: BS+, no TTP, erythematous area stable from yesterday and less red and less firm but still with significant erythema and some induration under pannus. Healing LIH wound that is about 5cm in diameter with granulation tissue and minimal clear drainage. No further spread from demarcated region.  Extremities: chronic venous stasis changes noted on skin of LE. 1+ edema. Cords felt in left calf and right lower leg.   Laboratory:  Recent Labs Lab 10/11/15 0526 10/12/15 0458 10/13/15 0535  WBC 24.3* 28.0* 25.9*  HGB 10.3* 10.3* 10.3*  HCT 34.4* 33.8* 34.4*  PLT 120* 142* 170    Recent Labs Lab 10/09/15  1056  10/10/15 0514 10/11/15 0526 10/12/15 0458  NA 137  < > 138 138 141  K 4.3  < > 4.1 3.7 4.5  CL 109  < > 106 104 108  CO2 24  --  27 27 28   BUN 13  < > 8 6 6   CREATININE 0.69  < > 0.71 0.63 0.66  CALCIUM 7.9*  --  7.8* 7.8* 8.1*  PROT 6.5  --   --   --   --   BILITOT 0.6  --   --   --   --    ALKPHOS 91  --   --   --   --   ALT 18  --   --   --   --   AST 20  --   --   --   --   GLUCOSE 158*  < > 134* 137* 133*  < > = values in this interval not displayed.Blood cx: NG 1 day  Imaging/Diagnostic Tests: IMPRESSION: 1. Fat containing periumbilical hernia, unchanged since December 2016 2. Skin thickening inferior to the umbilicus is relatively diffuse and likely represents cellulitis and/or panniculitis. 3. Mildly enlarged spleen, unchanged. 4. Scarring in the left lower pelvis consistent with site of previous hernia repair. There are a few reactive nodes in this region. 5. Nonobstructive stones in the kidneys.  Rogue Bussing, MD 10/13/2015, 7:25 AM PGY-1, [place] Intern pager: [phone], text pages welcome

## 2015-10-13 NOTE — Progress Notes (Signed)
  Subjective: No complaints  Objective: Vital signs in last 24 hours: Temp:  [98.1 F (36.7 C)-100.2 F (37.9 C)] 98.3 F (36.8 C) (06/14 0459) Pulse Rate:  [64-78] 71 (06/14 0459) Resp:  [18-20] 20 (06/14 0459) BP: (121-141)/(57-72) 121/57 mmHg (06/14 0459) SpO2:  [96 %-100 %] 98 % (06/14 0459) Last BM Date: 10/12/15  Intake/Output from previous day: 06/13 0701 - 06/14 0700 In: -  Out: 1450 [Urine:1450] Intake/Output this shift:    Resp: clear to auscultation bilaterally Cardio: regular rate and rhythm GI: soft, nontender. panus still red but softer  Lab Results:   Recent Labs  10/12/15 0458 10/13/15 0535  WBC 28.0* 25.9*  HGB 10.3* 10.3*  HCT 33.8* 34.4*  PLT 142* 170   BMET  Recent Labs  10/12/15 0458 10/13/15 0535  NA 141 140  K 4.5 3.9  CL 108 103  CO2 28 29  GLUCOSE 133* 127*  BUN 6 8  CREATININE 0.66 0.58*  CALCIUM 8.1* 8.3*   PT/INR No results for input(s): LABPROT, INR in the last 72 hours. ABG No results for input(s): PHART, HCO3 in the last 72 hours.  Invalid input(s): PCO2, PO2  Studies/Results: No results found.  Anti-infectives: Anti-infectives    Start     Dose/Rate Route Frequency Ordered Stop   10/12/15 1200  cephALEXin (KEFLEX) capsule 500 mg     500 mg Oral Every 6 hours 10/12/15 1052     10/12/15 0600  vancomycin (VANCOCIN) 1,500 mg in sodium chloride 0.9 % 500 mL IVPB  Status:  Discontinued     1,500 mg 250 mL/hr over 120 Minutes Intravenous Every 8 hours 10/11/15 2339 10/12/15 1052   10/09/15 2300  vancomycin (VANCOCIN) 1,500 mg in sodium chloride 0.9 % 500 mL IVPB  Status:  Discontinued     1,500 mg 250 mL/hr over 120 Minutes Intravenous Every 12 hours 10/09/15 1258 10/11/15 2339   10/09/15 1800  piperacillin-tazobactam (ZOSYN) IVPB 3.375 g  Status:  Discontinued     3.375 g 12.5 mL/hr over 240 Minutes Intravenous Every 8 hours 10/09/15 1258 10/12/15 1052   10/09/15 1015  piperacillin-tazobactam (ZOSYN) IVPB 3.375 g      3.375 g 100 mL/hr over 30 Minutes Intravenous  Once 10/09/15 1008 10/09/15 1121   10/09/15 1015  vancomycin (VANCOCIN) IVPB 1000 mg/200 mL premix  Status:  Discontinued     1,000 mg 200 mL/hr over 60 Minutes Intravenous  Once 10/09/15 1008 10/09/15 1013   10/09/15 1015  vancomycin (VANCOCIN) 2,500 mg in sodium chloride 0.9 % 500 mL IVPB     2,500 mg 250 mL/hr over 120 Minutes Intravenous  Once 10/09/15 1013 10/09/15 1515      Assessment/Plan: s/p * No surgery found * continue abx. may switch to orals  LOS: 4 days    TOTH III,Daksha Koone S 10/13/2015

## 2015-10-14 LAB — BASIC METABOLIC PANEL
Anion gap: 8 (ref 5–15)
BUN: 9 mg/dL (ref 6–20)
CHLORIDE: 102 mmol/L (ref 101–111)
CO2: 29 mmol/L (ref 22–32)
CREATININE: 0.77 mg/dL (ref 0.61–1.24)
Calcium: 8.5 mg/dL — ABNORMAL LOW (ref 8.9–10.3)
GFR calc Af Amer: >60 mL/min (ref >60)
GFR calc non Af Amer: >60 mL/min (ref >60)
GLUCOSE: 126 mg/dL — AB (ref 65–99)
POTASSIUM: 4.4 mmol/L (ref 3.5–5.1)
SODIUM: 139 mmol/L (ref 135–145)

## 2015-10-14 LAB — CBC
HCT: 35.6 % — ABNORMAL LOW (ref 39.0–52.0)
HEMOGLOBIN: 10.7 g/dL — AB (ref 13.0–17.0)
MCH: 24.3 pg — AB (ref 26.0–34.0)
MCHC: 30.1 g/dL (ref 30.0–36.0)
MCV: 80.9 fL (ref 78.0–100.0)
Platelets: 192 10*3/uL (ref 150–400)
RBC: 4.4 MIL/uL (ref 4.22–5.81)
RDW: 19.7 % — ABNORMAL HIGH (ref 11.5–15.5)
WBC: 31.6 10*3/uL — ABNORMAL HIGH (ref 4.0–10.5)

## 2015-10-14 LAB — CULTURE, BLOOD (ROUTINE X 2)
CULTURE: NO GROWTH
Culture: NO GROWTH

## 2015-10-14 MED ORDER — POLYETHYLENE GLYCOL 3350 17 G PO PACK: 17.0000 g | PACK | Freq: Every day | ORAL | Status: DC

## 2015-10-14 MED ORDER — SULFAMETHOXAZOLE-TRIMETHOPRIM 800-160 MG PO TABS: 2.0000 | ORAL_TABLET | Freq: Two times a day (BID) | ORAL | Status: AC

## 2015-10-14 NOTE — Progress Notes (Signed)
Family Medicine Teaching Service Daily Progress Note Intern Pager: [phone]  Patient name: Henry Hinton Medical record number: [id number] Date of birth: 1960-11-26 Age: 55 y.o. Gender: male  Primary Care Provider: [name], MD Consultants: Surgery Code Status:FULL  Pt Overview and Major Events to Date:  6/10: admitted for cellulitis/panniculitis: started IV Vanc (7 q12h doses) and Zosyn (8 q8h doses) 6/13: Switched to PO keflex 6/14: switched to bactrim   Assessment and Plan: Henry Hinton is a 55 y.o. male presenting with abdominal pain. PMH is significant for morbid obesity, CLL, symptomatic kidney stones, and history of DVT (completed Xarelto therapy), and left inguinal hernia repair with complication of incisional hematoma.   Abdominal Cellulitis:  Improved in terms of amount of induration and in degree of erythema. Initial CT was reassuring and was consistent with a cellulitis/panniculitis. Afebrile > 4 days. WBC stable.    - Switched to bactrim DS, two tablets BID x at least two weeks on 6/14 --> counseled on drug reaction signs to look out for (new rash, oral ulcers, trouble breathing, lip swelling) - Blood cultures: NG x 4 days - Urine culture with insignificant growth - Monitoring for spread  - Surgery will continue to follow. Appreciate recommendations: continue abx - Tylenol prn for mild/moderate pain; oxycodone IR 5 mg prn for severe pain (has not required)  CLL: WBC baseline is 22-28. Goes to [facility] and last draw was May. Gets labs drawn every three months. - 32.6 > 24.3 > 28.0 > 25.9 > 31.6 - Will monitor CBCs while hospitalized.   OSA: Uses CPAP on nightly basis  - Home machine brought to hospital  Healing Left Inguinal Hernia Wound: Had been using wound vac until April. Now doing dry dressings. Is not involved in periumbilical erythema.  - Surgery: daily dry dressing change   Nephrolithiasis: goes [facility] and was last treated last year. Has been told  his trigger is salt. CT abdomen showed punctate stone in the right kidney and 2 mm stone in left kidney but no hydronephrosis or stranding. Small blood on  UA.   History of R DVT: Denies leg pain but TTP on exam with cords. - Preliminary LE Doppler positive for superficial thrombosis of the greater saphenous vein. - Ice prn for pain  Venous stasis:  - Continue home lasix and Kdur - K 4.4 today  Constipation: Had BM yesterday. Normally goes every 2-3 days.  - Ordered miralax  FEN/GI: Heart healthy diet PPx: Lovenox  Disposition: Plan to discharge today.   Subjective:  No acute events overnight. He did have a posterior headache that resolved with ice yesterday evening. He attributes it to the antibiotics. He also reports an area of soreness on the right side of his tongue that he noticed yesterday afternoon. He had 2 bowel movements yesterday, which provided relief.   Objective: Temp:  [97.7 F (36.5 C)-98.7 F (37.1 C)] 97.7 F (36.5 C) (06/15 0548) Pulse Rate:  [60-73] 60 (06/15 0548) Resp:  [18-20] 18 (06/15 0548) BP: (125-137)/(60-61) 125/61 mmHg (06/15 0548) SpO2:  [98 %-100 %] 98 % (06/15 0548) Physical Exam: General: NAD, well-appearing, sitting up in bed EENT: No ulcers of tongue. Appears to have 0.5 cm area of swelling of papillae without erythema at right lateral edge of tongue.  Cardiovascular: RRR, no m/r/g Respiratory: normal wob, CTAB Abdomen: BS+, no TTP, erythematous area stable from yesterday with streaky areas of resolution at left upper border of involved skin. Still some induration at middle base  and right border of involved area, with central pustules. Healing LIH wound that is about 5cm in diameter with granulation tissue and minimal clear drainage. No further spread from demarcated region.  Extremities: chronic venous stasis changes noted on skin of LE. Trace edema. Cords felt in left calf and right lower leg.   Laboratory:  Recent Labs Lab  10/11/15 0526 10/12/15 0458 10/13/15 0535  WBC 24.3* 28.0* 25.9*  HGB 10.3* 10.3* 10.3*  HCT 34.4* 33.8* 34.4*  PLT 120* 142* 170    Recent Labs Lab 10/09/15 1056  10/11/15 0526 10/12/15 0458 10/13/15 0535  NA 137  < > 138 141 140  K 4.3  < > 3.7 4.5 3.9  CL 109  < > 104 108 103  CO2 24  < > 27 28 29   BUN 13  < > 6 6 8   CREATININE 0.69  < > 0.63 0.66 0.58*  CALCIUM 7.9*  < > 7.8* 8.1* 8.3*  PROT 6.5  --   --   --   --   BILITOT 0.6  --   --   --   --   ALKPHOS 91  --   --   --   --   ALT 18  --   --   --   --   AST 20  --   --   --   --   GLUCOSE 158*  < > 137* 133* 127*  < > = values in this interval not displayed.Blood cx: NG 1 day  Imaging/Diagnostic Tests: IMPRESSION: 1. Fat containing periumbilical hernia, unchanged since December 2016 2. Skin thickening inferior to the umbilicus is relatively diffuse and likely represents cellulitis and/or panniculitis. 3. Mildly enlarged spleen, unchanged. 4. Scarring in the left lower pelvis consistent with site of previous hernia repair. There are a few reactive nodes in this region. 5. Nonobstructive stones in the kidneys.  Rogue Bussing, MD 10/14/2015, 7:14 AM PGY-1, [place] Intern pager: [phone], text pages welcome

## 2015-10-14 NOTE — Discharge Summary (Signed)
[facility] Discharge Summary  Patient name: Henry Hinton Medical record number: [id number] Date of birth: Sep 10, 1960 Age: 55 y.o. Gender: male Date of Admission: 10/09/2015  Date of Discharge: 10/14/2015 Admitting Physician: Dickie La, MD  Primary Care Provider: [name], MD Consultants: Surgery  Indication for Hospitalization: cellulitis  Discharge Diagnoses/Problem List:  Active Problems:   Sepsis ([place])   Panniculitis   History of DVT (deep vein thrombosis)   Venous stasis  Disposition: Home  Discharge Condition: Improved  Discharge Exam:  Blood pressure 125/61, pulse 60, temperature 97.7 F (36.5 C), temperature source Oral, resp. rate 18, height 5\' 9"  (1.753 m), weight 394 lb (178.717 kg), SpO2 98 %. General: NAD, well-appearing, sitting up in bed EENT: No ulcers of tongue. Appears to have 0.5 cm area of swelling of papillae without erythema at right lateral edge of tongue.  Cardiovascular: RRR, no m/r/g Respiratory: normal wob, CTAB Abdomen: BS+, no TTP, erythematous area stable from yesterday with streaky areas of resolution at left upper border of involved skin. Still some induration at middle base and right border of involved area, with central pustules. Healing LIH wound that is about 5cm in diameter with granulation tissue and minimal clear drainage. No further spread from demarcated region.  Extremities: chronic venous stasis changes noted on skin of LE. Trace edema. Cords felt in left calf and right lower leg.   Please see end of note for photos from discharge and admission.   Brief Hospital Course:  Henry Hinton is a 55 y.o. male who presented with abdominal pain and erythema. PMH is significant for morbid obesity, CLL, symptomatic kidney stones, and history of DVT (completed Xarelto therapy), and left inguinal hernia repair with complication of incisional hematoma.   Abdominal Cellulitis/Panniculitis: Patient was  initially treated for sepsis upon admission due to tachycardia to 103 and fever to 102.9 F. qSOFA was 1, and patient appeared non-toxic. Fever improved with tylenol. Tachycardia quickly improved with IVFs. No lactic acidosis. WBC was initially 32.4 but was near baseline with patient's CLL. Vancomycin and zosyn were started in the ED and continued for 3.5 days. Surgery (Dr. Marlou Starks who performed [facility] repair) was consulted in ED for concern for necrotizing fasciitis and followed throughout patient's hospital course. CT was reassuring and was consistent with a cellulitis/panniculitis.   The area of involvement was outlined in marker and monitored throughout hospitalization. There was initial spread of erythema outside of outlined region, but cellulitis continued toimprove in terms of pain, amount of induration, and in degree of erythema. Patient was transitioned to PO keflex x 1 day without worsening of cellulitis but was then switched to bactrim for concern of continued significant induration and redness of lower pannus. He was instructed to continue bactrim DS, two tablets BID x at least two weeks (through 10/26/15). He was counseled on drug reaction signs to look out for (new rash, oral ulcers, trouble breathing, lip swelling). Prior to discharge, patient had stable vital signs and was afebrile for several days. WBC remained stable.Blood cultures were negative x 4 days.    Healing Left Inguinal Hernia Wound:  Surgery recommended continuing daily dry dressing changes.   Nephrolithiasis:  CT abdomen showed punctate stone in the right kidney and 2 mm stone in left kidney but no hydronephrosis or stranding. Small blood on UA.   History of R DVT:  Patient denied leg pain but had TTP on exam with cords, so LE Dopplers were performed, which were positive for superficial  thrombosis of the greater saphenous vein. Recommended ice prn for pain  Issues for Follow Up:  1. Please follow resolution of abdominal  cellulitis to determine total length of antibiotic treatment needed.  Significant Procedures: LE dopplers  Significant Labs and Imaging:   Recent Labs Lab 10/12/15 0458 10/13/15 0535 10/14/15 0521  WBC 28.0* 25.9* 31.6*  HGB 10.3* 10.3* 10.7*  HCT 33.8* 34.4* 35.6*  PLT 142* 170 192    Recent Labs Lab 10/09/15 1056  10/10/15 0514 10/11/15 0526 10/12/15 0458 10/13/15 0535 10/14/15 0521  NA 137  < > 138 138 141 140 139  K 4.3  < > 4.1 3.7 4.5 3.9 4.4  CL 109  < > 106 104 108 103 102  CO2 24  --  27 27 28 29 29   GLUCOSE 158*  < > 134* 137* 133* 127* 126*  BUN 13  < > 8 6 6 8 9   CREATININE 0.69  < > 0.71 0.63 0.66 0.58* 0.77  CALCIUM 7.9*  --  7.8* 7.8* 8.1* 8.3* 8.5*  ALKPHOS 91  --   --   --   --   --   --   AST 20  --   --   --   --   --   --   ALT 18  --   --   --   --   --   --   ALBUMIN 3.2*  --   --   --   --   --   --   < > = values in this interval not displayed.   Ct Abdomen Pelvis W Contrast  10/09/2015 CLINICAL DATA: Fever. Swelling inferior to the umbilicus. IMPRESSION: 1. Fat containing periumbilical hernia, unchanged since December 2016 2. Skin thickening inferior to the umbilicus is relatively diffuse and likely represents cellulitis and/or panniculitis. 3. Mildly enlarged spleen, unchanged. 4. Scarring in the left lower pelvis consistent with site of previous hernia repair. There are a few reactive nodes in this region. 5. Nonobstructive stones in the kidneys. Electronically Signed By: Dorise Bullion III M.D On: 10/09/2015 13:04   Results/Tests Pending at Time of Discharge: Final blood culture  Discharge Medications:    Medication List    TAKE these medications        aspirin EC 81 MG tablet  Take 81 mg by mouth daily.     furosemide 40 MG tablet  Commonly known as:  LASIX  Take 1 tablet (40 mg total) by mouth daily.     hydrocortisone cream 1 %  Apply 1 application topically 2 (two) times daily as needed for itching.     IRON  SUPPLEMENT 325 (65 FE) MG tablet  Generic drug:  ferrous sulfate  Take 325 mg by mouth daily with breakfast.     multivitamin with minerals tablet  Take 1 tablet by mouth daily.     polyethylene glycol packet  Commonly known as:  MIRALAX / GLYCOLAX  Take 17 g by mouth daily.     potassium chloride 10 MEQ tablet  Commonly known as:  KLOR-CON 10  TAKE 1 TABLET (10 MEQ TOTAL) BY MOUTH DAILY.     sulfamethoxazole-trimethoprim 800-160 MG tablet  Commonly known as:  BACTRIM DS,SEPTRA DS  Take 2 tablets by mouth every 12 (twelve) hours.          Below is photo from admission for comparison:    Discharge Instructions: Please refer to Patient Instructions section of EMR for full details.  Patient was counseled important signs and symptoms that should prompt return to medical care, changes in medications, dietary instructions, activity restrictions, and follow up appointments.   Follow-Up Appointments: Follow-up Information    Go to [name], MD.   Specialties:  Family Medicine, Sports Medicine   Why:  Wed, 6/21 between 8 am and 6 pm or Fri, 6/23 between 8 am and 6 pm   Contact information:   [employer] [id number] [phone]       Ryland Tungate Moen Rimsha Trembley, MD 10/14/2015, 5:34 PM PGY-1, [place]

## 2015-10-14 NOTE — Discharge Instructions (Signed)
Mr. Ausar, Rigler were admitted for soft tissue infection of your abdomen. Please continue taking the antibiotic bactrim, 2 tablets every 12 hours through at least 10/26/15. Please continue to follow with your primary care doctor to determine how long to continue antibiotics.   If you develop fever, chills, nausea/vomiting, worsening redness/tenderness of your abdomen, please return to the emergency room.   If you have new rash, ulcers of the mouth, swelling of the lips, or trouble breathing, please stop antibiotics immediately and contact your primary care doctor.  Thank you for letting us take care of you during this hospitalization.

## 2015-11-11 ENCOUNTER — Encounter: Payer: Self-pay | Admitting: Family Medicine

## 2015-11-11 ENCOUNTER — Ambulatory Visit (INDEPENDENT_AMBULATORY_CARE_PROVIDER_SITE_OTHER): Payer: BC Managed Care – PPO | Admitting: Family Medicine

## 2015-11-11 VITALS — BP 134/80 | HR 66 | Temp 97.7°F | Resp 18 | Ht 69.0 in | Wt 398.0 lb

## 2015-11-11 DIAGNOSIS — Z13220 Encounter for screening for lipoid disorders: Secondary | ICD-10-CM

## 2015-11-11 DIAGNOSIS — R490 Dysphonia: Secondary | ICD-10-CM

## 2015-11-11 DIAGNOSIS — R600 Localized edema: Secondary | ICD-10-CM | POA: Diagnosis not present

## 2015-11-11 DIAGNOSIS — C9110 Chronic lymphocytic leukemia of B-cell type not having achieved remission: Secondary | ICD-10-CM

## 2015-11-11 DIAGNOSIS — M793 Panniculitis, unspecified: Secondary | ICD-10-CM | POA: Diagnosis not present

## 2015-11-11 DIAGNOSIS — R739 Hyperglycemia, unspecified: Secondary | ICD-10-CM | POA: Diagnosis not present

## 2015-11-11 LAB — HEMOGLOBIN A1C
HEMOGLOBIN A1C: 6.6 % — AB (ref <5.7)
MEAN PLASMA GLUCOSE: 143 mg/dL

## 2015-11-11 MED ORDER — FUROSEMIDE 40 MG PO TABS: 40.0000 mg | ORAL_TABLET | Freq: Every day | ORAL | Status: DC

## 2015-11-11 MED ORDER — POTASSIUM CHLORIDE ER 10 MEQ PO TBCR: EXTENDED_RELEASE_TABLET | ORAL | Status: DC

## 2015-11-11 NOTE — Progress Notes (Addendum)
By signing my name below, I, [name], attest that this documentation has been prepared under the direction and in the presence of [name], MD.  Electronically Signed: [name], Medical Scribe. 11/11/2015. 12:21 PM.  Subjective:    Patient ID: Henry Hinton, male    DOB: November 05, 1960, 55 y.o.   MRN: [id number]  HPI Chief Complaint  Patient presents with  . Follow-up    hospital follow for abdominal swelling     HPI Comments: Jusitn Hinton is a 55 y.o. male who presents to the [facility] for hospital follow-up. Pt has a hx of multiple medical problems including obesity, DVT, inguinal hernias (complication with left hernia repair having an incisional hematoma), and CLL. He was admitted June 2nd - June 15th for sepsis and panniculitis. Pt states he has a hoarse voice and suspects it's due to the combination of his medications. Pt has experienced some occasional soreness in his right flank and middle abdomin- he's concerned for gallstones. Pt states his mental health has been fine, and states "it's another day, and it's one of those things you get and deal with". Pt denies smoking. Pt has fasted for blood testing today.  Cellulitis/Sepsis: Initially admitted for elevated heart rate and fever of 102.9. Initial WBC 32.4, but near baseline with his CLL. He was treated with Vancomycin and Zosyn. Surgery was consulted, CT scan indicated cellulitis/pannilulilis. He was discharged on Bactrim-DS 2 tablets BID through June 27th. Photo and discharge summary was noted for improvement since initial admission. Blood cultures were negative, but no other wound culture. Pt states the redness on his abdomin from his sepsis has improved. Pt has not been using any creams for his cellulitis. Pt denies experiencing fever, or redness.  DVT: See prior notes. Initially dx with DVT, treated with Xarelto. Dr. [name] is his  hematologist at [facility], and dx with CLL last year- plans to  see him 2nd week of August. He was noted to have superficial thrombosis of greater saphenous vein, treated with ice PRN. He was advised not to use blood thinners earlier this year due to bleeding in areas of surgery for inguinal hernia. Pt denies calf swelling or leg pain.  Inguinal Hernia: Left side. Because of hematoma, his inguinal hernia had to be opened.  GI: Dr. [name] planned colonoscopy July 25th. He had to have hernias repair prior to receveing a colonoscopy.  Obesity: with chronic lower edema. On Lasix 40 mg QD and potassium 10 meq QD. His last potassium was nl at 4.4 a month ago. He has exercised with swimming in the past. Diet changes have been discussed. Pt hasn't been swimming recently. Wt Readings from Last 3 Encounters:  11/11/15 398 lb (180.532 kg)  10/09/15 394 lb (178.717 kg)  06/17/15 401 lb (181.892 kg)   HLD: Levels okay last time they were checked. Lab Results  Component Value Date   CHOL 175 02/15/2015   HDL 50 02/15/2015   LDLCALC 99 02/15/2015   TRIG 128 02/15/2015   CHOLHDL 3.5 02/15/2015   DM Screening: Elevated blood sugars noted on most recent lab work. He is not currently on any medications for DM. Pt states his last blood sugar in Oct was at 24, but with his infections his blood sugars have been going up. Lab Results  Component Value Date   HGBA1C 6.9* 06/01/2015   SHx: Pt has 3 years until he retires.  Patient Active Problem List   Diagnosis Date Noted  . Venous  stasis   . History of DVT (deep vein thrombosis)   . Sepsis ([place]) 10/09/2015  . Panniculitis 10/09/2015  . CLL (chronic lymphocytic leukemia) ([place]) 05/27/2015  . 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 Leukocytosis (leucocytosis) 08/29/2014  . HNP (herniated nucleus pulposus), cervical 01/21/2013  . bilat hip replacement 04/08/2012  . Morbid obesity ([place]) 04/08/2012  . Edema 04/08/2012   Past Medical History  Diagnosis Date  . Arthritis   . Numbness and tingling     Hx: of in right arm  .  Heart murmur     Hx; of as a child  . Joint pain     Hx: of periodically  . Sleep apnea   . Pneumonia   . Kidney stones     Hx; of as a child  . Anemia   . cll 11-2014    CLL - 11/30/2014  . Family history of anesthesia complication     " father having delirium after anesthesia"  . DVT (deep venous thrombosis) ([place]) 6/16    Rt calf  took xarelto for 6 months   Past Surgical History  Procedure Laterality Date  . Lumbar laminectomy      Hx: of 2005  . Joint replacement    . Total hip arthroplasty      Hx: of B/L hips  . Tenosynovitis      Hx: of thumb  . Tonsillectomy    . Colonoscopy      Hx: of  . Posterior cervical laminectomy with met- rx Right 01/21/2013    Procedure: Right Sitting C6-7 Microdiskectomy with Met-rex;  Surgeon: Kristeen Miss, MD;  Location: [place] [facility];  Service: Neurosurgery;  Laterality: Right;  Right Sitting C6-7 Microdiskectomy with Met-rex  . Spine surgery    . Knee surgery Left 07/21/2014    Dr. Noemi Chapel  . Stent placement rt ureter ([facility] hx)  10/23/2014  . Wisdom tooth extraction  1974    all 4  . Inguinal hernia repair Left 06/01/2015    Procedure: LEFT INGUINAL HERNIA REPAIR WITH MESH;  Surgeon: Autumn Messing III, MD;  Location: [facility];  Service: General;  Laterality: Left;  . Insertion of mesh Left 06/01/2015    Procedure: INSERTION OF MESH;  Surgeon: Autumn Messing III, MD;  Location: [facility];  Service: General;  Laterality: Left;   Allergies  Allergen Reactions  . Adhesive [Tape] Other (See Comments)    Blisters and rash at site   Prior to Admission medications   Medication Sig Start Date End Date Taking? Authorizing Provider  aspirin EC 81 MG tablet Take 81 mg by mouth daily.   Yes Historical Provider, MD  ferrous sulfate (IRON SUPPLEMENT) 325 (65 FE) MG tablet Take 325 mg by mouth daily with breakfast.   Yes Historical Provider, MD  furosemide (LASIX) 40 MG tablet Take 1 tablet (40 mg total) by mouth daily. 02/15/15  Yes [name], MD  ibuprofen  (ADVIL,MOTRIN) 200 MG tablet Take 200 mg by mouth every 6 (six) hours as needed for moderate pain.   Yes Historical Provider, MD  Multiple Vitamins-Minerals (MULTIVITAMIN WITH MINERALS) tablet Take 1 tablet by mouth daily.   Yes Historical Provider, MD  polyethylene glycol (MIRALAX / GLYCOLAX) packet Take 17 g by mouth daily. 10/14/15  Yes Hillary Corinda Gubler, MD  potassium chloride (KLOR-CON 10) 10 MEQ tablet TAKE 1 TABLET (10 MEQ TOTAL) BY MOUTH DAILY. 02/15/15  Yes [name], MD   Social History   Social History  . Marital  Status: Divorced    Spouse Name: N/A  . Number of Children: N/A  . Years of Education: N/A   Occupational History  . 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 Smoking status: Never Smoker   . Smokeless tobacco: Former Systems developer    Types: [place] date: 05/01/1993  . Alcohol Use: 0.6 oz/week    1 Standard drinks or equivalent per week     Comment: [employer]  . Drug Use: No  . Sexual Activity: No   Other Topics Concern  . Not on file   Social History Narrative   Exercise swimming 3-5 times/week for 45 minutes   Review of Systems  Constitutional: Negative for fever.  HENT: Positive for voice change.   Cardiovascular: Negative for leg swelling.  Gastrointestinal: Positive for abdominal pain. Negative for nausea and vomiting.  Genitourinary: Positive for flank pain.  Musculoskeletal: Negative for myalgias.  Skin: Negative for color change.   Objective:  BP 134/80 mmHg  Pulse 66  Temp(Src) 97.7 F (36.5 C) (Oral)  Resp 18  Ht '5\' 9"'  (1.753 m)  Wt 398 lb (180.532 kg)  BMI 58.75 kg/m2  SpO2 94%  Physical Exam  Constitutional: He appears well-developed and well-nourished. No distress.  HENT:  Head: Normocephalic and atraumatic.  Eyes: Conjunctivae are normal.  Neck: Neck supple.  Cardiovascular: Normal rate.   Pulmonary/Chest: Effort normal.  Abdominal: There is no tenderness.    Musculoskeletal:  Lower extremity: Some statis changes with non pitting edema  Neurological: He is alert.  Skin: Skin is warm and dry.  Nearly closed wound on the left lower abdomen, some slight irritation around the wound, but no surrounding erythema. Minimal erythema in the right pannus but significantly faded from previous picture, see attached photo  Psychiatric: He has a normal mood and affect. His behavior is normal.  Nursing note and vitals reviewed.    Assessment & Plan:   Kycen Spalla is a 55 y.o. male Lower leg edema - Plan: furosemide (LASIX) 40 MG tablet, potassium chloride (KLOR-CON 10) 10 MEQ tablet, COMPLETE METABOLIC PANEL WITH GFR  - Stable/improved. Continue Lasix, potassium supplementation. If flare of swelling, can try additional dose of Lasix, but would also combine with 1 additional dose of potassium. RTC precautions if persistent. Ultimately weight loss may help with this, but has had multiple illnesses this past year that have impacted exercise.  -If any unilateral swelling, check ultrasound with history of DVT, and superficial phlebitis seen in hospital.  Panniculitis  -Improving. Image placed in chart. RTC precautions if increased redness, rash.  CLL (chronic lymphocytic leukemia) ([place])  - Continue follow-up with hematologist.  Hyperglycemia - Plan: Hemoglobin A1c  -Check A1c to determine if prediabetic.  Screening for hyperlipidemia - Plan: COMPLETE METABOLIC PANEL WITH GFR, Lipid panel  Voice hoarseness  -Relative voice rest, symptomatic care, but if not improving within the next few weeks, may warrant ENT evaluation.  Meds ordered this encounter  Medications  . furosemide (LASIX) 40 MG tablet    Sig: Take 1 tablet (40 mg total) by mouth daily.    Dispense:  90 tablet    Refill:  1  . potassium chloride (KLOR-CON 10) 10 MEQ tablet    Sig: TAKE 1 TABLET (10 MEQ TOTAL) BY MOUTH DAILY.    Dispense:  90 tablet    Refill:  1   Patient  Instructions       IF you received an  x-ray today, you will receive an invoice from [facility]. Please contact [facility] at [phone] with questions or concerns regarding your invoice.   IF you received labwork today, you will receive an invoice from [employer]. Please contact [facility] at [phone] with questions or concerns regarding your invoice.   Our billing staff will not be able to assist you with questions regarding bills from these companies.  You will be contacted with the lab results as soon as they are available. The fastest way to get your results is to activate your My Chart account. Instructions are located on the last page of this paperwork. If you have not heard from [place] regarding the results in 2 weeks, please contact this office.     Okay to continue the same dose of Lasix for now, but if you do have some increased swelling in your legs, and decide to take an additional dose of Lasix, I would also recommend taking an additional dose of potassium. If you have to do this more than once a week or 2, let me know so we can decide on dose changes.  The cellulitis/skin infection of the abdomen appears to be much improved. If redness does return, or any spreading redness from your other wound, recheck here or other medical provider right away.  Keep follow-up with gastroenterology and your hematologist as planned.  If your voice is not returning to normal in the next few weeks, let me know, and I can refer you to ENT for further evaluation.  I will check your three-month average of blood sugar today, and based on those results can decide if any other treatments or testing needed.  Follow-up with me in the next 6 months, sooner if any new issues, or let me know by MyChart.       I personally performed the services described in this documentation, which was scribed in my presence. The recorded information has been reviewed  and considered, and addended by me as needed.   Signed,   [name], MD [facility].  11/11/2015 12:59 PM

## 2015-11-11 NOTE — Patient Instructions (Addendum)
     IF you received an x-ray today, you will receive an invoice from [facility]. Please contact [facility] at [phone] with questions or concerns regarding your invoice.   IF you received labwork today, you will receive an invoice from [employer]. Please contact [facility] at [phone] with questions or concerns regarding your invoice.   Our billing staff will not be able to assist you with questions regarding bills from these companies.  You will be contacted with the lab results as soon as they are available. The fastest way to get your results is to activate your My Chart account. Instructions are located on the last page of this paperwork. If you have not heard from [place] regarding the results in 2 weeks, please contact this office.     Okay to continue the same dose of Lasix for now, but if you do have some increased swelling in your legs, and decide to take an additional dose of Lasix, I would also recommend taking an additional dose of potassium. If you have to do this more than once a week or 2, let me know so we can decide on dose changes.  The cellulitis/skin infection of the abdomen appears to be much improved. If redness does return, or any spreading redness from your other wound, recheck here or other medical provider right away.  Keep follow-up with gastroenterology and your hematologist as planned.  If your voice is not returning to normal in the next few weeks, let me know, and I can refer you to ENT for further evaluation.  I will check your three-month average of blood sugar today, and based on those results can decide if any other treatments or testing needed.  Follow-up with me in the next 6 months, sooner if any new issues, or let me know by MyChart.

## 2015-11-12 LAB — COMPLETE METABOLIC PANEL WITH GFR
ALT: 23 U/L (ref 9–46)
AST: 20 U/L (ref 10–35)
Albumin: 3.8 g/dL (ref 3.6–5.1)
Alkaline Phosphatase: 97 U/L (ref 40–115)
BUN: 13 mg/dL (ref 7–25)
CO2: 24 mmol/L (ref 20–31)
Calcium: 8.3 mg/dL — ABNORMAL LOW (ref 8.6–10.3)
Chloride: 103 mmol/L (ref 98–110)
Creat: 0.64 mg/dL — ABNORMAL LOW (ref 0.70–1.33)
GFR, Est Non African American: >89 mL/min (ref >=60)
Glucose, Bld: 94 mg/dL (ref 65–99)
Potassium: 5.1 mmol/L (ref 3.5–5.3)
Sodium: 139 mmol/L (ref 135–146)
Total Bilirubin: 0.4 mg/dL (ref 0.2–1.2)
Total Protein: 7 g/dL (ref 6.1–8.1)

## 2015-11-12 LAB — LIPID PANEL
CHOL/HDL RATIO: 3.4 ratio (ref <=5.0)
Cholesterol: 188 mg/dL (ref 125–200)
HDL: 56 mg/dL (ref >=40)
LDL CALC: 109 mg/dL (ref <130)
TRIGLYCERIDES: 116 mg/dL (ref <150)
VLDL: 23 mg/dL (ref <30)

## 2015-11-16 ENCOUNTER — Other Ambulatory Visit: Payer: Self-pay | Admitting: Family Medicine

## 2015-11-18 ENCOUNTER — Encounter (HOSPITAL_COMMUNITY): Payer: Self-pay | Admitting: *Deleted

## 2015-11-18 ENCOUNTER — Encounter: Payer: Self-pay | Admitting: Family Medicine

## 2015-11-18 ENCOUNTER — Telehealth: Payer: Self-pay | Admitting: Internal Medicine

## 2015-11-18 NOTE — Telephone Encounter (Signed)
Prep instructions reviewed with pt over the phone.

## 2015-11-18 NOTE — Telephone Encounter (Signed)
Left message for pt to call back on both phone numbers.

## 2015-11-23 ENCOUNTER — Encounter (HOSPITAL_COMMUNITY): Payer: Self-pay

## 2015-11-23 ENCOUNTER — Ambulatory Visit (HOSPITAL_COMMUNITY)
Admission: RE | Admit: 2015-11-23 | Discharge: 2015-11-23 | Disposition: A | Discharge status: Home / Self Care | Payer: BC Managed Care – PPO | Source: Ambulatory Visit | Attending: Internal Medicine | Admitting: Internal Medicine

## 2015-11-23 ENCOUNTER — Encounter (HOSPITAL_COMMUNITY)
Admission: RE | Disposition: A | Discharge status: Home / Self Care | Payer: Self-pay | Source: Ambulatory Visit | Attending: Internal Medicine

## 2015-11-23 ENCOUNTER — Ambulatory Visit (HOSPITAL_COMMUNITY): Payer: BC Managed Care – PPO | Admitting: Anesthesiology

## 2015-11-23 DIAGNOSIS — Z87891 Personal history of nicotine dependence: Secondary | ICD-10-CM | POA: Insufficient documentation

## 2015-11-23 DIAGNOSIS — Z96643 Presence of artificial hip joint, bilateral: Secondary | ICD-10-CM | POA: Insufficient documentation

## 2015-11-23 DIAGNOSIS — Z86718 Personal history of other venous thrombosis and embolism: Secondary | ICD-10-CM | POA: Insufficient documentation

## 2015-11-23 DIAGNOSIS — G4730 Sleep apnea, unspecified: Secondary | ICD-10-CM | POA: Diagnosis not present

## 2015-11-23 DIAGNOSIS — M1990 Unspecified osteoarthritis, unspecified site: Secondary | ICD-10-CM | POA: Diagnosis not present

## 2015-11-23 DIAGNOSIS — Z1211 Encounter for screening for malignant neoplasm of colon: Secondary | ICD-10-CM | POA: Insufficient documentation

## 2015-11-23 DIAGNOSIS — Z6841 Body Mass Index (BMI) 40.0 and over, adult: Secondary | ICD-10-CM | POA: Insufficient documentation

## 2015-11-23 DIAGNOSIS — K4090 Unilateral inguinal hernia, without obstruction or gangrene, not specified as recurrent: Secondary | ICD-10-CM | POA: Insufficient documentation

## 2015-11-23 DIAGNOSIS — R195 Other fecal abnormalities: Secondary | ICD-10-CM | POA: Diagnosis not present

## 2015-11-23 HISTORY — DX: Personal history of urinary calculi: Z87.442

## 2015-11-23 HISTORY — PX: COLONOSCOPY: SHX5424

## 2015-11-23 HISTORY — DX: Body Mass Index (BMI) 40.0 and over, adult: Z684

## 2015-11-23 SURGERY — COLONOSCOPY
Anesthesia: Monitor Anesthesia Care

## 2015-11-23 MED ORDER — PROPOFOL 10 MG/ML IV BOLUS
INTRAVENOUS | Status: AC
  Filled 2015-11-23: qty 20

## 2015-11-23 MED ORDER — PROPOFOL 500 MG/50ML IV EMUL
INTRAVENOUS | Status: DC | PRN
  Administered 2015-11-23: 100 ug/kg/min via INTRAVENOUS

## 2015-11-23 MED ORDER — PROPOFOL 10 MG/ML IV BOLUS
INTRAVENOUS | Status: AC
  Filled 2015-11-23: qty 40

## 2015-11-23 MED ORDER — LACTATED RINGERS IV SOLN
INTRAVENOUS | Status: DC
  Administered 2015-11-23: 1000 mL via INTRAVENOUS

## 2015-11-23 MED ORDER — PROPOFOL 500 MG/50ML IV EMUL
INTRAVENOUS | Status: DC | PRN
  Administered 2015-11-23: 50 mg via INTRAVENOUS
  Administered 2015-11-23 (×2): 20 mg via INTRAVENOUS

## 2015-11-23 MED ORDER — LIDOCAINE HCL (CARDIAC) 20 MG/ML IV SOLN
INTRAVENOUS | Status: DC | PRN
  Administered 2015-11-23: 40 mg via INTRATRACHEAL

## 2015-11-23 MED ORDER — LIDOCAINE HCL (CARDIAC) 20 MG/ML IV SOLN
INTRAVENOUS | Status: AC
  Filled 2015-11-23: qty 5

## 2015-11-23 NOTE — Op Note (Signed)
[facility] Patient Name: Henry Hinton Procedure Date: 11/23/2015 MRN: [id number] Attending MD: Docia Chuck. [name] , MD Date of Birth: 22-Oct-1960 CSN: [id number] Age: 55 Admit Type: Outpatient Procedure:                Colonoscopy Indications:              Positive Cologuard test Providers:                Docia Chuck. [name], MD, Cleda Daub, RN, Ralene Bathe,                            Technician, Rosario Adie, CRNA Referring MD:             Ranell Patrick. Carlota Raspberry, MD Medicines:                Monitored Anesthesia Care Complications:            No immediate complications. Estimated blood loss:                            None. Estimated Blood Loss:     Estimated blood loss: none. Procedure:                Pre-Anesthesia Assessment:                           - Prior to the procedure, a History and Physical                            was performed, and patient medications and                            allergies were reviewed. The patient's tolerance of                            previous anesthesia was also reviewed. The risks                            and benefits of the procedure and the sedation                            options and risks were discussed with the patient.                            All questions were answered, and informed consent                            was obtained. Prior Anticoagulants: The patient has                            taken no previous anticoagulant or antiplatelet                            agents. ASA Grade Assessment: III - A patient with  severe systemic disease. After reviewing the risks                            and benefits, the patient was deemed in                            satisfactory condition to undergo the procedure.                           After obtaining informed consent, the colonoscope                            was passed under direct vision. Throughout the   procedure, the patient's blood pressure, pulse, and                            oxygen saturations were monitored continuously. The                            [id number] scope was introduced through                            the anus and advanced to the the cecum, identified                            by appendiceal orifice and ileocecal valve. The                            ileocecal valve, appendiceal orifice, and rectum                            were photographed. The quality of the bowel                            preparation was excellent. The colonoscopy was                            performed without difficulty. The patient tolerated                            the procedure well. The bowel preparation used was                            SUPREP. Scope In: 9:06:16 AM Scope Out: 9:20:02 AM Scope Withdrawal Time: 0 hours 9 minutes 27 seconds  Total Procedure Duration: 0 hours 13 minutes 46 seconds  Findings:      The entire examined colon appeared normal on direct and retroflexion       views. Impression:               - The entire examined colon is normal on direct and                            retroflexion views.                           -  No specimens collected. Moderate Sedation:      none Recommendation:           - Repeat colonoscopy in 10 years for screening                            purposes.                           - Patient has a contact number available for                            emergencies. The signs and symptoms of potential                            delayed complications were discussed with the                            patient. Return to normal activities tomorrow.                            Written discharge instructions were provided to the                            patient.                           - Resume previous diet.                           - Continue present medications. Procedure Code(s):        --- Professional ---                            [phone], Colonoscopy, flexible; diagnostic, including                            collection of specimen(s) by brushing or washing,                            when performed (separate procedure) Diagnosis Code(s):        --- Professional ---                           R19.5, Other fecal abnormalities CPT copyright 2016 American Medical Association. All rights reserved. The codes documented in this report are preliminary and upon coder review may  be revised to meet current compliance requirements. Docia Chuck. [name], MD 11/23/2015 9:27:57 AM This report has been signed electronically. Number of Addenda: 0

## 2015-11-23 NOTE — Anesthesia Postprocedure Evaluation (Signed)
Anesthesia Post Note  Patient: Henry Hinton  Procedure(s) Performed: Procedure(s) (LRB): COLONOSCOPY (N/A)  Patient location during evaluation: PACU Anesthesia Type: MAC Level of consciousness: awake and alert Pain management: pain level controlled Vital Signs Assessment: post-procedure vital signs reviewed and stable Respiratory status: spontaneous breathing, nonlabored ventilation, respiratory function stable and patient connected to nasal cannula oxygen Cardiovascular status: stable and blood pressure returned to baseline Anesthetic complications: no    Last Vitals:  Vitals:   11/23/15 0930 11/23/15 0940  BP: 126/76 122/73  Pulse: 74 72  Resp: (!) 24 18  Temp: 36.4 C     Last Pain:  Vitals:   11/23/15 0930  TempSrc: Oral                 Inell Mimbs S

## 2015-11-23 NOTE — Discharge Instructions (Signed)

## 2015-11-23 NOTE — H&P (Signed)
Progress Notes     HISTORY OF PRESENT ILLNESS:  Henry Hinton is a 55 y.o. male with multiple significant medical problems including morbid obesity with a BMI of almost 60, CLL, symptomatic kidney stones requiring intervention, and history of DVT for which he recently completed Xarelto therapy. Patient is sent today by his primary care provider Dr. Carlota Raspberry regarding routine screening colonoscopy. Patient tells me that he was hospitalized in [place] in 2004 with cellulitis. He was found to be anemic. Thus, he underwent colonoscopy which he reports as unremarkable. He has no lower GI complaints except for inguinal pain on the right secondary to known inguinal hernia. Review of outside CT scan also shows large inguinal hernia on the left with sigmoid colon present without obstruction in June. Patient understands that he needs to see a general surgeon regarding this. Outside blood work reviewed. Most recent hemoglobin 12.0 04/26/2015. No family history of colon cancer  REVIEW OF SYSTEMS:  All non-GI ROS negative except for fatigue, muscle cramps, extremity edema       Past Medical History  Diagnosis Date  . Family history of anesthesia complication     " father having delirium after anesthesia"  . Arthritis   . Numbness and tingling     Hx: of in right arm  . Heart murmur     Hx; of as a child  . Joint pain     Hx: of periodically  . Sleep apnea   . Pneumonia   . Kidney stones     Hx; of as a child  . Anemia   . cll 11-2014    CLL - 11/30/2014          Past Surgical History  Procedure Laterality Date  . Lumbar laminectomy      Hx: of 2005  . Joint replacement    . Total hip arthroplasty      Hx: of B/L hips  . Tenosynovitis      Hx: of thumb  . Tonsillectomy    . Colonoscopy      Hx: of  . Posterior cervical laminectomy with met- rx Right 01/21/2013    Procedure: Right Sitting C6-7 Microdiskectomy with Met-rex;  Surgeon:  Kristeen Miss, MD;  Location: [place] [facility];  Service: Neurosurgery;  Laterality: Right;  Right Sitting C6-7 Microdiskectomy with Met-rex  . Spine surgery    . Knee surgery Left 07/21/2014    Dr. Noemi Chapel  . Stent placement rt ureter ([facility] hx)  10/23/2014    Social History Dayven Linsley  reports that he has never smoked. He quit smokeless tobacco use about 22 years ago. His smokeless tobacco use included Chew. He reports that he drinks about 0.6 oz of alcohol per week. He reports that he does not use illicit drugs.  family history includes COPD in his maternal grandmother; Diabetes in his father; Heart disease in his maternal grandfather; Lung cancer in his paternal grandfather; Prostate cancer in his father.       Allergies  Allergen Reactions  . Adhesive [Tape] Other (See Comments)    Blisters and rash at site       PHYSICAL EXAMINATION: Vital signs: BP 126/76 mmHg  Pulse 76  Ht _0  (1.753 m)  Wt 401 lb 8 oz (182.119 kg)  BMI 59.26 kg/m2  Constitutional: Markedly obese, unhealthy appearing, no acute distress Psychiatric: alert and oriented x3, cooperative Eyes: extraocular movements intact, anicteric, conjunctiva pink Mouth: oral pharynx moist, no lesions Neck: Thick, supple no  lymphadenopathy Cardiovascular: heart regular rate and rhythm, no murmur Lungs: clear to auscultation bilaterally Abdomen: soft, markedly obese, nontender, nondistended, no obvious ascites, no peritoneal signs, normal bowel sounds, no organomegaly. Umbilical hernia Groin: Tender right inguinal hernia. Difficult to examine due to obesity Rectal: Omitted Extremities: no clubbing or cyanosis. Chronic stasis changes with woody edema bilaterally Skin: no lesions on visible extremities Neuro: No focal deficits. No asterixis.   ASSESSMENT:  #1. Colon cancer screening. Patient is not appropriate for colonoscopy given sigmoid colon in left groin. Also, extremely high risk for sedation  related complications given his body habitus. We discussed other strategies #2. Symptomatic inguinal hernias #3. Morbid obesity  PLAN:  #1. Recommended cologard as a screening tool for colorectal cancer in this patient #2. Cologard positive would need to have hernias repaired in order to proceed with colonoscopy #3. Instructed to see general surgeon regarding hernia repair #4. Discussed importance of weight loss #5. Return to Dr. Carlota Raspberry for general medical care     No interval changes. Prepped well.  Docia Chuck. Geri [place]in[place]., M.D. [facility] Division of Gastroenterology

## 2015-11-23 NOTE — Transfer of Care (Signed)
Immediate Anesthesia Transfer of Care Note  Patient: Henry Hinton  Procedure(s) Performed: Procedure(s): COLONOSCOPY (N/A)  Patient Location: PACU  Anesthesia Type:MAC  Level of Consciousness: awake, alert  and oriented  Airway & Oxygen Therapy: Patient Spontanous Breathing and Patient connected to face mask oxygen  Post-op Assessment: Report given to RN and Post -op Vital signs reviewed and stable  Post vital signs: Reviewed and stable  Last Vitals:  Vitals:   11/23/15 0746  BP: (!) 144/66  Pulse: 66  Resp: 18  Temp: 36.8 C    Last Pain:  Vitals:   11/23/15 0746  TempSrc: Oral         Complications: No apparent anesthesia complications

## 2015-11-23 NOTE — Anesthesia Preprocedure Evaluation (Addendum)
Anesthesia Evaluation  Patient identified by MRN, date of birth, ID band Patient awake    Reviewed: Allergy & Precautions, NPO status , Patient's Chart, lab work & pertinent test results  Airway Mallampati: III  TM Distance: <3 FB Neck ROM: Full    Dental no notable dental hx.    Pulmonary sleep apnea ,    breath sounds clear to auscultation + decreased breath sounds      Cardiovascular negative cardio ROS Normal cardiovascular exam Rhythm:Regular Rate:Normal     Neuro/Psych negative neurological ROS  negative psych ROS   GI/Hepatic negative GI ROS, Neg liver ROS,   Endo/Other  Morbid obesity  Renal/GU negative Renal ROS  negative genitourinary   Musculoskeletal negative musculoskeletal ROS (+)   Abdominal   Peds negative pediatric ROS (+)  Hematology negative hematology ROS (+)   Anesthesia Other Findings   Reproductive/Obstetrics negative OB ROS                             Anesthesia Physical Anesthesia Plan  ASA: III  Anesthesia Plan: MAC   Post-op Pain Management:    Induction: Intravenous  Airway Management Planned: Simple Face Mask  Additional Equipment:   Intra-op Plan:   Post-operative Plan: Extubation in OR  Informed Consent: I have reviewed the patients History and Physical, chart, labs and discussed the procedure including the risks, benefits and alternatives for the proposed anesthesia with the patient or authorized representative who has indicated his/her understanding and acceptance.   Dental advisory given  Plan Discussed with: CRNA and Surgeon  Anesthesia Plan Comments:         Anesthesia Quick Evaluation

## 2015-11-24 ENCOUNTER — Encounter (HOSPITAL_COMMUNITY): Payer: Self-pay | Admitting: Internal Medicine

## 2015-11-27 ENCOUNTER — Observation Stay (HOSPITAL_COMMUNITY): Payer: BC Managed Care – PPO

## 2015-11-27 ENCOUNTER — Encounter: Payer: Self-pay | Admitting: Family Medicine

## 2015-11-27 ENCOUNTER — Inpatient Hospital Stay (HOSPITAL_COMMUNITY)
Admission: EM | Admit: 2015-11-27 | Discharge: 2015-11-30 | DRG: 872 | Disposition: A | Discharge status: Home / Self Care | Payer: BC Managed Care – PPO | Attending: Internal Medicine | Admitting: Internal Medicine

## 2015-11-27 ENCOUNTER — Encounter (HOSPITAL_COMMUNITY): Payer: Self-pay | Admitting: Emergency Medicine

## 2015-11-27 DIAGNOSIS — M793 Panniculitis, unspecified: Secondary | ICD-10-CM | POA: Diagnosis not present

## 2015-11-27 DIAGNOSIS — A419 Sepsis, unspecified organism: Secondary | ICD-10-CM | POA: Diagnosis not present

## 2015-11-27 DIAGNOSIS — I878 Other specified disorders of veins: Secondary | ICD-10-CM | POA: Diagnosis not present

## 2015-11-27 DIAGNOSIS — Z825 Family history of asthma and other chronic lower respiratory diseases: Secondary | ICD-10-CM

## 2015-11-27 DIAGNOSIS — Z96643 Presence of artificial hip joint, bilateral: Secondary | ICD-10-CM | POA: Diagnosis present

## 2015-11-27 DIAGNOSIS — Z8042 Family history of malignant neoplasm of prostate: Secondary | ICD-10-CM

## 2015-11-27 DIAGNOSIS — E662 Morbid (severe) obesity with alveolar hypoventilation: Secondary | ICD-10-CM | POA: Diagnosis present

## 2015-11-27 DIAGNOSIS — L03319 Cellulitis of trunk, unspecified: Secondary | ICD-10-CM | POA: Diagnosis present

## 2015-11-27 DIAGNOSIS — K5909 Other constipation: Secondary | ICD-10-CM | POA: Diagnosis present

## 2015-11-27 DIAGNOSIS — Z7982 Long term (current) use of aspirin: Secondary | ICD-10-CM

## 2015-11-27 DIAGNOSIS — L03311 Cellulitis of abdominal wall: Secondary | ICD-10-CM | POA: Diagnosis not present

## 2015-11-27 DIAGNOSIS — L0390 Cellulitis, unspecified: Secondary | ICD-10-CM | POA: Diagnosis present

## 2015-11-27 DIAGNOSIS — D72829 Elevated white blood cell count, unspecified: Secondary | ICD-10-CM | POA: Diagnosis not present

## 2015-11-27 DIAGNOSIS — C9110 Chronic lymphocytic leukemia of B-cell type not having achieved remission: Secondary | ICD-10-CM | POA: Diagnosis present

## 2015-11-27 DIAGNOSIS — G4733 Obstructive sleep apnea (adult) (pediatric): Secondary | ICD-10-CM | POA: Diagnosis not present

## 2015-11-27 DIAGNOSIS — Z87442 Personal history of urinary calculi: Secondary | ICD-10-CM

## 2015-11-27 DIAGNOSIS — Z833 Family history of diabetes mellitus: Secondary | ICD-10-CM

## 2015-11-27 DIAGNOSIS — Z6841 Body Mass Index (BMI) 40.0 and over, adult: Secondary | ICD-10-CM

## 2015-11-27 DIAGNOSIS — Z8249 Family history of ischemic heart disease and other diseases of the circulatory system: Secondary | ICD-10-CM

## 2015-11-27 DIAGNOSIS — Z801 Family history of malignant neoplasm of trachea, bronchus and lung: Secondary | ICD-10-CM

## 2015-11-27 DIAGNOSIS — E872 Acidosis: Secondary | ICD-10-CM | POA: Diagnosis present

## 2015-11-27 DIAGNOSIS — D509 Iron deficiency anemia, unspecified: Secondary | ICD-10-CM | POA: Diagnosis present

## 2015-11-27 HISTORY — DX: Cellulitis, unspecified: L03.90

## 2015-11-27 LAB — I-STAT CG4 LACTIC ACID, ED
Lactic Acid, Venous: 2.63 mmol/L (ref 0.5–1.9)
Lactic Acid, Venous: 2.21 mmol/L (ref 0.5–1.9)

## 2015-11-27 LAB — URINALYSIS, ROUTINE W REFLEX MICROSCOPIC
Bilirubin Urine: NEGATIVE
GLUCOSE, UA: NEGATIVE mg/dL
Hgb urine dipstick: NEGATIVE
Ketones, ur: NEGATIVE mg/dL
LEUKOCYTES UA: NEGATIVE
Nitrite: NEGATIVE
PH: 6.5 (ref 5.0–8.0)
Protein, ur: NEGATIVE mg/dL
Specific Gravity, Urine: 1.022 (ref 1.005–1.030)

## 2015-11-27 LAB — CBC WITH DIFFERENTIAL/PLATELET
BASOS PCT: 0 %
Basophils Absolute: 0 10*3/uL (ref 0.0–0.1)
EOS PCT: 0 %
Eosinophils Absolute: 0 10*3/uL (ref 0.0–0.7)
HEMATOCRIT: 41.2 % (ref 39.0–52.0)
Hemoglobin: 13.4 g/dL (ref 13.0–17.0)
LYMPHS ABS: 30.6 10*3/uL — AB (ref 0.7–4.0)
Lymphocytes Relative: 76 %
MCH: 26.7 pg (ref 26.0–34.0)
MCHC: 32.5 g/dL (ref 30.0–36.0)
MCV: 82.2 fL (ref 78.0–100.0)
MONOS PCT: 1 %
Monocytes Absolute: 0.4 10*3/uL (ref 0.1–1.0)
NEUTROS PCT: 23 %
Neutro Abs: 9.3 10*3/uL — ABNORMAL HIGH (ref 1.7–7.7)
Platelets: 209 10*3/uL (ref 150–400)
RBC: 5.01 MIL/uL (ref 4.22–5.81)
RDW: 17.8 % — ABNORMAL HIGH (ref 11.5–15.5)
WBC: 40.3 10*3/uL — AB (ref 4.0–10.5)

## 2015-11-27 LAB — COMPREHENSIVE METABOLIC PANEL
ALBUMIN: 3.5 g/dL (ref 3.5–5.0)
ALK PHOS: 99 U/L (ref 38–126)
ALT: 26 U/L (ref 17–63)
ANION GAP: 7 (ref 5–15)
AST: 25 U/L (ref 15–41)
BUN: 13 mg/dL (ref 6–20)
CALCIUM: 8.4 mg/dL — AB (ref 8.9–10.3)
CO2: 26 mmol/L (ref 22–32)
Chloride: 105 mmol/L (ref 101–111)
Creatinine, Ser: 0.69 mg/dL (ref 0.61–1.24)
GFR calc Af Amer: >60 mL/min (ref >60)
GFR calc non Af Amer: >60 mL/min (ref >60)
GLUCOSE: 129 mg/dL — AB (ref 65–99)
Potassium: 4.6 mmol/L (ref 3.5–5.1)
SODIUM: 138 mmol/L (ref 135–145)
Total Bilirubin: 0.5 mg/dL (ref 0.3–1.2)
Total Protein: 6.8 g/dL (ref 6.5–8.1)

## 2015-11-27 MED ORDER — ONDANSETRON HCL 4 MG/2ML IJ SOLN
4.0000 mg | Freq: Four times a day (QID) | INTRAMUSCULAR | Status: DC | PRN
  Filled 2015-11-27: qty 2

## 2015-11-27 MED ORDER — SODIUM CHLORIDE 0.9 % IV SOLN
1000.0000 mL | INTRAVENOUS | Status: DC
  Administered 2015-11-27 – 2015-11-28 (×2): 1000 mL via INTRAVENOUS

## 2015-11-27 MED ORDER — VANCOMYCIN HCL IN DEXTROSE 1-5 GM/200ML-% IV SOLN
1000.0000 mg | Freq: Once | INTRAVENOUS | Status: AC
  Administered 2015-11-27: 1000 mg via INTRAVENOUS
  Filled 2015-11-27: qty 200

## 2015-11-27 MED ORDER — PIPERACILLIN-TAZOBACTAM 3.375 G IVPB 30 MIN
3.3750 g | Freq: Once | INTRAVENOUS | Status: AC
  Administered 2015-11-27: 3.375 g via INTRAVENOUS
  Filled 2015-11-27: qty 50

## 2015-11-27 MED ORDER — HYDROCODONE-ACETAMINOPHEN 5-325 MG PO TABS: 1.0000 | ORAL_TABLET | Freq: Four times a day (QID) | ORAL | Status: AC | PRN

## 2015-11-27 MED ORDER — ONDANSETRON HCL 4 MG PO TABS: 4.0000 mg | ORAL_TABLET | Freq: Four times a day (QID) | ORAL | Status: DC | PRN

## 2015-11-27 MED ORDER — ONDANSETRON HCL 4 MG/2ML IJ SOLN
4.0000 mg | Freq: Three times a day (TID) | INTRAMUSCULAR | Status: DC | PRN
Start: 2015-11-27 — End: 2015-11-27

## 2015-11-27 MED ORDER — POLYETHYLENE GLYCOL 3350 17 G PO PACK
17.0000 g | PACK | Freq: Every day | ORAL | Status: DC
  Administered 2015-11-28 – 2015-11-30 (×3): 17 g via ORAL
  Filled 2015-11-27 (×3): qty 1

## 2015-11-27 MED ORDER — IOPAMIDOL (ISOVUE-300) INJECTION 61%
INTRAVENOUS | Status: AC
  Administered 2015-11-27: 100 mL
  Filled 2015-11-27: qty 100

## 2015-11-27 MED ORDER — PIPERACILLIN-TAZOBACTAM 3.375 G IVPB 30 MIN: 3.3750 g | Freq: Once | INTRAVENOUS | Status: DC

## 2015-11-27 MED ORDER — ENOXAPARIN SODIUM 40 MG/0.4ML ~~LOC~~ SOLN: 40.0000 mg | SUBCUTANEOUS | Status: DC

## 2015-11-27 MED ORDER — IBUPROFEN 200 MG PO TABS
200.0000 mg | ORAL_TABLET | Freq: Four times a day (QID) | ORAL | Status: DC | PRN
  Administered 2015-11-27 – 2015-11-29 (×2): 200 mg via ORAL
  Filled 2015-11-27 (×2): qty 1

## 2015-11-27 MED ORDER — ASPIRIN EC 81 MG PO TBEC
81.0000 mg | DELAYED_RELEASE_TABLET | Freq: Every day | ORAL | Status: DC
  Administered 2015-11-28 – 2015-11-30 (×3): 81 mg via ORAL
  Filled 2015-11-27 (×3): qty 1

## 2015-11-27 MED ORDER — ACETAMINOPHEN 325 MG PO TABS
650.0000 mg | ORAL_TABLET | Freq: Four times a day (QID) | ORAL | Status: DC | PRN
Start: 2015-11-27 — End: 2015-11-30
  Administered 2015-11-27: 650 mg via ORAL
  Filled 2015-11-27: qty 2

## 2015-11-27 MED ORDER — ADULT MULTIVITAMIN W/MINERALS CH
1.0000 | ORAL_TABLET | Freq: Every day | ORAL | Status: DC
  Administered 2015-11-28 – 2015-11-30 (×3): 1 via ORAL
  Filled 2015-11-27 (×3): qty 1

## 2015-11-27 MED ORDER — PIPERACILLIN-TAZOBACTAM 3.375 G IVPB
3.3750 g | Freq: Three times a day (TID) | INTRAVENOUS | Status: DC
  Administered 2015-11-27 – 2015-11-30 (×9): 3.375 g via INTRAVENOUS
  Filled 2015-11-27 (×11): qty 50

## 2015-11-27 MED ORDER — BISACODYL 10 MG RE SUPP: 10.0000 mg | Freq: Every day | RECTAL | Status: DC | PRN

## 2015-11-27 MED ORDER — ACETAMINOPHEN 650 MG RE SUPP: 650.0000 mg | Freq: Four times a day (QID) | RECTAL | Status: DC | PRN

## 2015-11-27 MED ORDER — FERROUS SULFATE 325 (65 FE) MG PO TABS
325.0000 mg | ORAL_TABLET | Freq: Every day | ORAL | Status: DC
  Administered 2015-11-28 – 2015-11-30 (×3): 325 mg via ORAL
  Filled 2015-11-27 (×3): qty 1

## 2015-11-27 MED ORDER — SODIUM CHLORIDE 0.9 % IV BOLUS (SEPSIS)
500.0000 mL | Freq: Once | INTRAVENOUS | Status: AC
  Administered 2015-11-27: 500 mL via INTRAVENOUS

## 2015-11-27 MED ORDER — VANCOMYCIN HCL IN DEXTROSE 1-5 GM/200ML-% IV SOLN: 1000.0000 mg | Freq: Once | INTRAVENOUS | Status: DC

## 2015-11-27 MED ORDER — VANCOMYCIN HCL 10 G IV SOLR
1500.0000 mg | Freq: Two times a day (BID) | INTRAVENOUS | Status: DC
  Administered 2015-11-27 – 2015-11-30 (×6): 1500 mg via INTRAVENOUS
  Filled 2015-11-27 (×8): qty 1500

## 2015-11-27 NOTE — ED Triage Notes (Signed)
IV start attempted by two RN's unable to access site.

## 2015-11-27 NOTE — H&P (Signed)
History and Physical    [name] VQM:[id number] DOB: [birth year] DOA: 11/27/2015  Referring MD/NP/PA: Arlean Hopping PCP: [name], MD  Outpatient Specialists: Dr. Marlou Starks, General Surgery  Patient coming from: home  Chief Complaint: abdominal redness and pain  HPI: Henry Hinton is a 55 y.o. male with medical history significant of CLL with leukocytosis, kidney stones, morbid obesity with OSA, who presents with abdominal pain and redness.  He underwent a left inguinal hernia repair with insertion of mesh on 06/01/2015.  He developed a post-operative hematoma that ruptured requiring surgical evaluation, wound vac application and healing by secondary intention.  He has had a residual fistula or nonhealing wound on his left lower abdomen since that time.  In June, he was hospitalized with sepsis due to abdominal wall cellulitis, not complicated by deeper tissue or mesh infection.  He completed two weeks of high dose bactrim after discharge and his symptoms resolved.  He awoke this morning and noticed a soreness or burning of the skin on the left mid-abdomen and when he looked, he noticed an area of redness in the same area similar to last month's cellulitis.  It did not appear to originate from around his fistula.  The redness has rapidly spread since early this morning and is now double in size and starting to surround his fistular area.  He denies recent fevers, chills, nausea, vomiting, diarrhea, or other systemic symptoms.    ED Course: VSS.  Labs notable for WBC 40,000 increased from his baseline of 25-30,000.  Lactic acid was mildly elevated at 2.37.  He is awaiting a CT of the abdomen and pelvis.  Patient received vancomycin and zosyn in the emergency department.    Review of Systems:  General:  Denies fevers, chills, weight loss or gain HEENT:  Denies changes to hearing and vision, rhinorrhea, sinus congestion, sore throat.  "laryngitis" since antibiotics given last month CV:  Denies  chest pain and palpitations, chronic stable lower extremity edema.  PULM:  Denies SOB, wheezing, cough.   GI:  Denies nausea, vomiting, constipation, diarrhea.   GU:  Denies dysuria, frequency, urgency ENDO:  Denies polyuria, polydipsia.   HEME:  Denies hematemesis, blood in stools, melena, abnormal bruising or bleeding.  LYMPH:  Denies lymphadenopathy.   MSK:  Denies arthralgias, myalgias.   DERM:  As per HPI NEURO:  Denies focal numbness, weakness, slurred speech, confusion, facial droop.  PSYCH:  Denies anxiety and depression.    Past Medical History:  Diagnosis Date  . Anemia   . Arthritis   . BMI 50.0-59.9, adult ([place])   . cll 11/2014   CLL - 11/30/2014- Dr. [facility] [place], [place]  . DVT (deep venous thrombosis) ([place]) 6/16   Rt calf  took xarelto for 6 months  . Family history of anesthesia complication    " father having delirium after anesthesia"  . Heart murmur    Hx; of as a child  . History of kidney stones    x2  . Joint pain    Hx: of periodically  . Numbness and tingling    Hx: of in right arm  . Pneumonia   . Sleep apnea    Cpap use- settings 10    Past Surgical History:  Procedure Laterality Date  . COLONOSCOPY     Hx: of  . COLONOSCOPY N/A 11/23/2015   Procedure: COLONOSCOPY;  Surgeon: Irene Shipper, MD;  Location: [facility];  Service: Endoscopy;  Laterality: N/A;  . INGUINAL  HERNIA REPAIR Left 06/01/2015   Procedure: LEFT INGUINAL HERNIA REPAIR WITH MESH;  Surgeon: Autumn Messing III, MD;  Location: [facility];  Service: General;  Laterality: Left;  . INSERTION OF MESH Left 06/01/2015   Procedure: INSERTION OF MESH;  Surgeon: Autumn Messing III, MD;  Location: [facility];  Service: General;  Laterality: Left;  . JOINT REPLACEMENT Bilateral    BTHA  . KNEE SURGERY Left 07/21/2014   Dr. Noemi Chapel -scope  . LUMBAR LAMINECTOMY     Hx: of 2005  . POSTERIOR CERVICAL LAMINECTOMY WITH MET- RX Right 01/21/2013   Procedure: Right Sitting C6-7 Microdiskectomy with  Met-rex;  Surgeon: Kristeen Miss, MD;  Location: [place] NEURO ORS;  Service: Neurosurgery;  Laterality: Right;  Right Sitting C6-7 Microdiskectomy with Met-rex  . SPINE SURGERY    . STENT PLACEMENT RT URETER ([place] HX)  10/23/2014  . tenosynovitis     Hx: of thumb  . TONSILLECTOMY    . TOTAL HIP ARTHROPLASTY     Hx: of B/L hips  . [place] EXTRACTION  1974   all 4     reports that he has never smoked. He quit smokeless tobacco use about 22 years ago. His smokeless tobacco use included Chew. He reports that he drinks about 0.6 oz of alcohol per week . He reports that he does not use drugs.  Allergies  Allergen Reactions  . Adhesive [Tape] Other (See Comments)    Blisters and rash at site    Family History  Problem Relation Age of Onset  . COPD Maternal Grandmother   . Heart disease Maternal Grandfather   . Lung cancer Paternal Grandfather     LUNG  . Diabetes Father   . Prostate cancer Father     PROSTATE    Prior to Admission medications   Medication Sig Start Date End Date Taking? Authorizing Provider  aspirin EC 81 MG tablet Take 81 mg by mouth daily.   Yes Historical Provider, MD  ferrous sulfate (IRON SUPPLEMENT) 325 (65 FE) MG tablet Take 325 mg by mouth daily with breakfast.   Yes Historical Provider, MD  furosemide (LASIX) 40 MG tablet Take 1 tablet (40 mg total) by mouth daily. 11/11/15  Yes [name], MD  ibuprofen (ADVIL,MOTRIN) 200 MG tablet Take 200 mg by mouth every 6 (six) hours as needed for moderate pain.   Yes Historical Provider, MD  Multiple Vitamins-Minerals (MULTIVITAMIN WITH MINERALS) tablet Take 1 tablet by mouth daily.   Yes Historical Provider, MD  polyethylene glycol (MIRALAX / GLYCOLAX) packet Take 17 g by mouth daily. 10/14/15  Yes Hillary Corinda Gubler, MD  potassium chloride (KLOR-CON 10) 10 MEQ tablet TAKE 1 TABLET (10 MEQ TOTAL) BY MOUTH DAILY. 11/11/15  Yes [name], MD    Physical Exam: Vitals:   11/27/15 1124 11/27/15 1308  11/27/15 1533 11/27/15 1534  BP: 135/78 152/88 153/84   Pulse: 81 80 89   Resp: _0 Temp: 98.3 F (36.8 C) 98.4 F (36.9 C) 98.8 F (37.1 C)   TempSrc: Oral  Oral   SpO2: 96%  97%   Weight:    (!) 177.8 kg (392 lb)  Height:    _1  (1.753 m)  Body mass index is 57.89 kg/m.   Constitutional: NAD, calm, comfortable Eyes: PERRL, lids and conjunctivae normal ENMT: Mucous membranes are moist. Posterior pharynx clear of any exudate or lesions.Normal dentition.  Neck: normal, supple, no masses, no thyromegaly Respiratory: clear to auscultation  bilaterally, no wheezing, no crackles. Normal respiratory effort. No accessory muscle use.  Cardiovascular: Regular rate and rhythm, 2/6 systolic murmur at the RSB.  1+ pitting edema of the bilateral lower extremities.   2+ pedal pulses.  Abdomen:  NABS, soft, nondistended.  nontender to deeper palpation, however, skin over cellulitic area was sensitive.   Musculoskeletal:  Mild clubbing / cyanosis. No joint deformity upper and lower extremities. Good ROM, no contractures. Normal muscle tone.  Skin:  Erythema that covers the upper panus with well-demarcated edge, hot pink to dark red and somewhat raised.  Indurated.  No obvious fluctuance.  The more central area is darker red and the more recently effected areas on the left and right are more pink.  The area has been marked with a surgical marker which now includes the fisula area but this appears to be secondarily involved.     Neurologic: CN 2-12 grossly intact. Sensation intact, DTR normal. Strength 5/5 in all 4.  Psychiatric: Normal judgment and insight. Alert and oriented x 3. Normal mood.   Labs on Admission: I have personally reviewed following labs and imaging studies  CBC:  Recent Labs Lab 11/27/15 1134  WBC 40.3*  NEUTROABS 9.3*  HGB 13.4  HCT 41.2  MCV 82.2  PLT 267   Basic Metabolic Panel:  Recent Labs Lab 11/27/15 1134  NA 138  K 4.6  CL 105  CO2 26  GLUCOSE 129*   BUN 13  CREATININE 0.69  CALCIUM 8.4*   GFR: Estimated Creatinine Clearance: 167.5 mL/min (by C-G formula based on SCr of 0.8 mg/dL). Liver Function Tests:  Recent Labs Lab 11/27/15 1134  AST 25  ALT 26  ALKPHOS 99  BILITOT 0.5  PROT 6.8  ALBUMIN 3.5   Urine analysis:    Component Value Date/Time   COLORURINE YELLOW 11/27/2015 1140   APPEARANCEUR CLEAR 11/27/2015 1140   LABSPEC 1.022 11/27/2015 1140   PHURINE 6.5 11/27/2015 1140   GLUCOSEU NEGATIVE 11/27/2015 1140   HGBUR NEGATIVE 11/27/2015 1140   [place] 11/27/2015 1140   BILIRUBINUR neg 12/06/2013 1446   KETONESUR NEGATIVE 11/27/2015 1140   PROTEINUR NEGATIVE 11/27/2015 1140   UROBILINOGEN 0.2 10/28/2014 1008   NITRITE NEGATIVE 11/27/2015 1140   LEUKOCYTESUR NEGATIVE 11/27/2015 1140   EKG from 11/19/2014:  NSR with QTc 432mec  Assessment/Plan Active Problems:   Cellulitis   Rapidly progressive cellulitis with leukocytosis and mild lactic acidosis but does not meet criteria for sepsis.  Worrisome how quickly this cellulitis has spread.  -  Continue Vancomycin and zosyn for severe nonpurulent cellulitis per protocol -  Blood cultures pending -  CT abd/pelvis pending -  Monitor for clinical improvement.  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    DVT prophylaxis: lovenox  Code Status: full Family Communication: patient and his mother at bedside  Disposition Plan: inpatient  Consults called: none  Admission  status: Observation, mRosita FireMD [facility] Pager [phone] If 7PM-7AM, please contact night-coverage [email or web address] Password [email or web address] 11/27/2015, 4:13 PM

## 2015-11-27 NOTE — ED Notes (Signed)
Melissa, RN Charge was notified of abnormal lab test results

## 2015-11-27 NOTE — ED Provider Notes (Signed)
[facility] DEPT Provider Note   CSN: [id number] Arrival date & time: 11/27/15  1055  First Provider Contact:  First MD Initiated Contact with Patient 11/27/15 1309        History   Chief Complaint Chief Complaint  Patient presents with  . Wound Infection    HPI Henry Hinton is a 55 y.o. male.  HPI   Henry Hinton is a 55 y.o. male, with a history of cellulitis, CLL, morbid obesity, and DVT, presenting to the ED with a skin infection to the skin of his lower abdomen that arose this morning. Patient states that the area of erythema has expanded since 8 AM this morning. Also has a purulent draining wound from a left inguinal hernia repair performed Jun 01, 2015. Performed by Dr. Marlou Starks. Patient ended up having a abdominal hematoma as a complication that required repeat surgery on 06/21/2015. Pt has CLL and usually has a WBC count between 25 and 33. Last WBC count was 31.6 on June 15 when he was admitted for cellulitis. Dr. [name] at [facility] hematology/oncology in [facility] is patient's oncologist. Last check up was May 2017.  Patient denies fever/chills, N/V/C/D, shortness of breath, abdominal pain, or any other complaints.       Past Medical History:  Diagnosis Date  . Anemia   . Arthritis   . BMI 50.0-59.9, adult ([place])   . cll 11/2014   CLL - 11/30/2014- [facility]. [facility] [place], [place]  . DVT (deep venous thrombosis) ([place]) 6/16   Rt calf  took xarelto for 6 months  . Family history of anesthesia complication    " father having delirium after anesthesia"  . Heart murmur    Hx; of as a child  . History of kidney stones    x2  . Joint pain    Hx: of periodically  . Numbness and tingling    Hx: of in right arm  . Pneumonia   . Sleep apnea    Cpap use- settings 10    Patient Active Problem List   Diagnosis Date Noted  . Cellulitis 11/27/2015  . Nonspecific abnormal finding in stool contents   . Colon cancer screening   . Venous stasis   .  History of DVT (deep vein thrombosis)   . Sepsis ([place]) 10/09/2015  . Panniculitis 10/09/2015  . CLL (chronic lymphocytic leukemia) ([place]) 05/27/2015  . 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 Leukocytosis (leucocytosis) 08/29/2014  . HNP (herniated nucleus pulposus), cervical 01/21/2013  . bilat hip replacement 04/08/2012  . Morbid obesity ([place]) 04/08/2012  . Edema 04/08/2012    Past Surgical History:  Procedure Laterality Date  . COLONOSCOPY     Hx: of  . COLONOSCOPY N/A 11/23/2015   Procedure: COLONOSCOPY;  Surgeon: Irene Shipper, MD;  [facility];  Service: Endoscopy;  Laterality: N/A;  . INGUINAL HERNIA REPAIR Left 06/01/2015   Procedure: LEFT INGUINAL HERNIA REPAIR WITH MESH;  Surgeon: Autumn Messing III, MD;  Location: [facility]S;  Service: General;  Laterality: Left;  . INSERTION OF MESH Left 06/01/2015   Procedure: INSERTION OF MESH;  Surgeon: Autumn Messing III, MD;  Location: [facility];  Service: General;  Laterality: Left;  . JOINT REPLACEMENT Bilateral    BTHA  . KNEE SURGERY Left 07/21/2014   Dr. Noemi Chapel -scope  . LUMBAR LAMINECTOMY     Hx: of 2005  . POSTERIOR CERVICAL LAMINECTOMY WITH MET- RX Right 01/21/2013   Procedure: Right Sitting C6-7 Microdiskectomy with Met-rex;  Surgeon: Kristeen Miss, MD;  Location: [facility] NEURO ORS;  Service: Neurosurgery;  Laterality: Right;  Right Sitting C6-7 Microdiskectomy with Met-rex  . SPINE SURGERY    . STENT PLACEMENT RT URETER ([place] HX)  10/23/2014  . tenosynovitis     Hx: of thumb  . TONSILLECTOMY    . TOTAL HIP ARTHROPLASTY     Hx: of B/L hips  . WISDOM TOOTH EXTRACTION  1974   all 4       Home Medications    Prior to Admission medications   Medication Sig Start Date End Date Taking? Authorizing Provider  aspirin EC 81 MG tablet Take 81 mg by mouth daily.   Yes Historical Provider, MD  ferrous sulfate (IRON SUPPLEMENT) 325 (65 FE) MG tablet Take 325 mg by mouth daily with breakfast.   Yes Historical Provider, MD  furosemide (LASIX) 40 MG  tablet Take 1 tablet (40 mg total) by mouth daily. 11/11/15  Yes [name], MD  ibuprofen (ADVIL,MOTRIN) 200 MG tablet Take 200 mg by mouth every 6 (six) hours as needed for moderate pain.   Yes Historical Provider, MD  Multiple Vitamins-Minerals (MULTIVITAMIN WITH MINERALS) tablet Take 1 tablet by mouth daily.   Yes Historical Provider, MD  polyethylene glycol (MIRALAX / GLYCOLAX) packet Take 17 g by mouth daily. 10/14/15  Yes Hillary Corinda Gubler, MD  potassium chloride (KLOR-CON 10) 10 MEQ tablet TAKE 1 TABLET (10 MEQ TOTAL) BY MOUTH DAILY. 11/11/15  Yes [name], MD    Family History Family History  Problem Relation Age of Onset  . COPD Maternal Grandmother   . Heart disease Maternal Grandfather   . Lung cancer Paternal Grandfather     LUNG  . Diabetes Father   . Prostate cancer Father     PROSTATE    Social History Social History  Substance Use Topics  . Smoking status: Never Smoker  . Smokeless tobacco: Former Systems developer    Types: Chew    Quit date: 05/01/1993  . Alcohol use 0.6 oz/week    1 Standard drinks or equivalent per week     Comment: BEER AND LIQUOR     Allergies   Adhesive [tape]   Review of Systems Review of Systems  Constitutional: Negative for chills, diaphoresis and fever.  Respiratory: Negative for shortness of breath.   Cardiovascular: Negative for chest pain.  Gastrointestinal: Negative for abdominal pain, constipation, diarrhea, nausea and vomiting.  Genitourinary: Negative for difficulty urinating and dysuria.  Skin: Positive for color change and wound.  All other systems reviewed and are negative.    Physical Exam Updated Vital Signs BP 152/88   Pulse 80   Temp 98.4 F (36.9 C)   Resp 18   SpO2 96%   Physical Exam  Constitutional: He appears well-developed and well-nourished. No distress.  HENT:  Head: Normocephalic and atraumatic.  Eyes: Conjunctivae are normal.  Neck: Neck supple.  Cardiovascular: Normal rate, regular  rhythm, normal heart sounds and intact distal pulses.   Pulmonary/Chest: Effort normal and breath sounds normal. No respiratory distress.  Abdominal: Soft. There is no tenderness. There is no guarding.  Musculoskeletal: He exhibits no edema or tenderness.  Lymphadenopathy:    He has no cervical adenopathy.  Neurological: He is alert.  Skin: Skin is warm and dry. He is not diaphoretic. There is erythema.  Patient has an area of erythema on the skin of the abdomen below the umbilicus. The area of erythema stretches almost fully across the abdomen, crossing  the midline. Purulent, erythematous surgical wound noted in the left lower abdomen.  Psychiatric: He has a normal mood and affect. His behavior is normal.  Nursing note and vitals reviewed.    ED Treatments / Results  Labs (all labs ordered are listed, but only abnormal results are displayed) Labs Reviewed  COMPREHENSIVE METABOLIC PANEL - Abnormal; Notable for the following:       Result Value   Glucose, Bld 129 (*)    Calcium 8.4 (*)    All other components within normal limits  CBC WITH DIFFERENTIAL/PLATELET - Abnormal; Notable for the following:    WBC 40.3 (*)    RDW 17.8 (*)    Neutro Abs 9.3 (*)    Lymphs Abs 30.6 (*)    All other components within normal limits  I-STAT CG4 LACTIC ACID, ED - Abnormal; Notable for the following:    Lactic Acid, Venous 2.63 (*)    All other components within normal limits  I-STAT CG4 LACTIC ACID, ED - Abnormal; Notable for the following:    Lactic Acid, Venous 2.21 (*)    All other components within normal limits  CULTURE, BLOOD (ROUTINE X 2)  CULTURE, BLOOD (ROUTINE X 2)  URINE CULTURE  URINALYSIS, ROUTINE W REFLEX MICROSCOPIC (NOT AT [facility])    EKG  EKG Interpretation None       Radiology No results found.  Procedures Procedures (including critical care time)  Medications Ordered in ED Medications  0.9 %  sodium chloride infusion (not administered)  iopamidol (ISOVUE-300)  61 % injection (not administered)  piperacillin-tazobactam (ZOSYN) IVPB 3.375 g (0 g Intravenous Stopped 11/27/15 1511)  vancomycin (VANCOCIN) IVPB 1000 mg/200 mL premix (1,000 mg Intravenous New Bag/Given 11/27/15 1450)     Initial Impression / Assessment and Plan / ED Course  I have reviewed the triage vital signs and the nursing notes.  Pertinent labs & imaging results that were available during my care of the patient were reviewed by me and considered in my medical decision making (see chart for details).  Clinical Course    Henry Hinton presents for cellulitis of the skin of the abdomen.  Findings and plan of care discussed with Orlie Dakin, MD. Dr. Winfred Leeds personally evaluated and examined this patient.  Patient with cellulitis that appears to be spreading quickly based on patient's account. Leukocytosis above patient's normal level and elevated lactic acid. Patient is afebrile, not tachycardic, and currently nontoxic appearing. Code sepsis activated with IV fluids and IV antibiotics. Delay in antibiotic administration was due to the patient's poor IV access and the need for the IV team. Admission via hospitalist. Spoke with Janece Canterbury, hospitalist, who agreed to admit the patient to [facility].  Vitals:   11/27/15 1124 11/27/15 1308 11/27/15 1533 11/27/15 1534  BP: 135/78 152/88 153/84   Pulse: 81 80 89   Resp: _0 Temp: 98.3 F (36.8 C) 98.4 F (36.9 C) 98.8 F (37.1 C)   TempSrc: Oral  Oral   SpO2: 96%  97%   Weight:    (!) 177.8 kg  Height:    _1  (1.753 m)     Final Clinical Impressions(s) / ED Diagnoses   Final diagnoses:  Cellulitis of abdominal wall    New Prescriptions New Prescriptions   No medications on file     Layla Maw 11/27/15 [place], MD 11/27/15 1635

## 2015-11-27 NOTE — Progress Notes (Signed)
Pt admitted to 6N11 via stretcher from ED.  Pt AAO X4. Pt on RA.   Pt has 22G to Lt FA.  Pt has redness to mid to rt abdomen.  Pt has no questions at the moment.  Report rcvd from [place], [place].  Will continue to monitor.

## 2015-11-27 NOTE — ED Triage Notes (Signed)
IV team at bedside for IV placement  

## 2015-11-27 NOTE — ED Triage Notes (Signed)
Pt states hes here for cellulitis on his abdomen, states he was admitted in June for the same issue for 6 days and had several antibiotics to treat it. Pt had sepsis at that time. Pt in NAD, vss.

## 2015-11-27 NOTE — Progress Notes (Signed)
Pharmacy Antibiotic Note  Henry Hinton is a 55 y.o. male admitted on 11/27/2015 with cellulitis.  Starting vancomycin and zosyn. SCr 0.69, LA 2.2.   Plan:  -Zosyn 3.375 g IV q8h -Vancomycin 1500 mg IV q12h -Monitor renal fx, cultures, duration of therapy -Check VT at Css   Height: 5\' 9"  (175.3 cm) Weight: (!) 391 lb 15.7 oz (177.8 kg) IBW/kg (Calculated) : 70.7  Temp (24hrs), Avg:98.3 F (36.8 C), Min:98 F (36.7 C), Max:98.8 F (37.1 C)   Recent Labs Lab 11/27/15 1134 11/27/15 1157 11/27/15 1511  WBC 40.3*  --   --   CREATININE 0.69  --   --   LATICACIDVEN  --  2.63* 2.21*    Estimated Creatinine Clearance: 167.5 mL/min (by C-G formula based on SCr of 0.8 mg/dL).    Allergies  Allergen Reactions  . Adhesive [Tape] Other (See Comments)    Blisters and rash at site    Antimicrobials this admission: 7/29 vanc > 7/29 zosyn >  Dose adjustments this admission: NA  Microbiology results: 7/29 blood cx: 7/29 urine cx:  Thank you for allowing pharmacy to be a part of this patient's care.  Harvel Quale 11/27/2015 6:51 PM

## 2015-11-27 NOTE — ED Provider Notes (Signed)
Complains of redness abdominal wall and from umbilical area onset 8 AM today coming by general malaise no known fever. No other associated symptoms. Patient does have a chronically draining wound at site of prior left inguinal herniorrhaphy   Orlie Dakin, MD 11/27/15 1635

## 2015-11-27 NOTE — Progress Notes (Signed)
Pt. brought in home CPAP/BiPAP unit.

## 2015-11-28 DIAGNOSIS — K5909 Other constipation: Secondary | ICD-10-CM | POA: Diagnosis present

## 2015-11-28 DIAGNOSIS — Z96643 Presence of artificial hip joint, bilateral: Secondary | ICD-10-CM | POA: Diagnosis present

## 2015-11-28 DIAGNOSIS — Z825 Family history of asthma and other chronic lower respiratory diseases: Secondary | ICD-10-CM | POA: Diagnosis not present

## 2015-11-28 DIAGNOSIS — Z801 Family history of malignant neoplasm of trachea, bronchus and lung: Secondary | ICD-10-CM | POA: Diagnosis not present

## 2015-11-28 DIAGNOSIS — L0390 Cellulitis, unspecified: Secondary | ICD-10-CM | POA: Diagnosis present

## 2015-11-28 DIAGNOSIS — Z833 Family history of diabetes mellitus: Secondary | ICD-10-CM | POA: Diagnosis not present

## 2015-11-28 DIAGNOSIS — L03311 Cellulitis of abdominal wall: Secondary | ICD-10-CM | POA: Diagnosis present

## 2015-11-28 DIAGNOSIS — E662 Morbid (severe) obesity with alveolar hypoventilation: Secondary | ICD-10-CM | POA: Diagnosis present

## 2015-11-28 DIAGNOSIS — Z87442 Personal history of urinary calculi: Secondary | ICD-10-CM | POA: Diagnosis not present

## 2015-11-28 DIAGNOSIS — I878 Other specified disorders of veins: Secondary | ICD-10-CM

## 2015-11-28 DIAGNOSIS — D72829 Elevated white blood cell count, unspecified: Secondary | ICD-10-CM | POA: Diagnosis not present

## 2015-11-28 DIAGNOSIS — D509 Iron deficiency anemia, unspecified: Secondary | ICD-10-CM | POA: Diagnosis present

## 2015-11-28 DIAGNOSIS — Z6841 Body Mass Index (BMI) 40.0 and over, adult: Secondary | ICD-10-CM | POA: Diagnosis not present

## 2015-11-28 DIAGNOSIS — Z7982 Long term (current) use of aspirin: Secondary | ICD-10-CM | POA: Diagnosis not present

## 2015-11-28 DIAGNOSIS — A419 Sepsis, unspecified organism: Secondary | ICD-10-CM | POA: Diagnosis present

## 2015-11-28 DIAGNOSIS — E872 Acidosis: Secondary | ICD-10-CM | POA: Diagnosis present

## 2015-11-28 DIAGNOSIS — M793 Panniculitis, unspecified: Secondary | ICD-10-CM | POA: Diagnosis present

## 2015-11-28 DIAGNOSIS — Z8042 Family history of malignant neoplasm of prostate: Secondary | ICD-10-CM | POA: Diagnosis not present

## 2015-11-28 DIAGNOSIS — C9110 Chronic lymphocytic leukemia of B-cell type not having achieved remission: Secondary | ICD-10-CM | POA: Diagnosis present

## 2015-11-28 DIAGNOSIS — Z8249 Family history of ischemic heart disease and other diseases of the circulatory system: Secondary | ICD-10-CM | POA: Diagnosis not present

## 2015-11-28 LAB — CBC
HCT: 38.6 % — ABNORMAL LOW (ref 39.0–52.0)
Hemoglobin: 11.7 g/dL — ABNORMAL LOW (ref 13.0–17.0)
MCH: 25.4 pg — AB (ref 26.0–34.0)
MCHC: 30.3 g/dL (ref 30.0–36.0)
MCV: 83.9 fL (ref 78.0–100.0)
PLATELETS: 171 10*3/uL (ref 150–400)
RBC: 4.6 MIL/uL (ref 4.22–5.81)
RDW: 18.2 % — AB (ref 11.5–15.5)
WBC: 32.9 10*3/uL — AB (ref 4.0–10.5)

## 2015-11-28 LAB — BASIC METABOLIC PANEL
Anion gap: 5 (ref 5–15)
BUN: 12 mg/dL (ref 6–20)
CO2: 26 mmol/L (ref 22–32)
CREATININE: 0.77 mg/dL (ref 0.61–1.24)
Calcium: 8.1 mg/dL — ABNORMAL LOW (ref 8.9–10.3)
Chloride: 106 mmol/L (ref 101–111)
GFR calc Af Amer: >60 mL/min (ref >60)
GFR calc non Af Amer: >60 mL/min (ref >60)
Glucose, Bld: 125 mg/dL — ABNORMAL HIGH (ref 65–99)
Potassium: 4.4 mmol/L (ref 3.5–5.1)
SODIUM: 137 mmol/L (ref 135–145)

## 2015-11-28 LAB — URINE CULTURE

## 2015-11-28 MED ORDER — ENOXAPARIN SODIUM 80 MG/0.8ML ~~LOC~~ SOLN
80.0000 mg | SUBCUTANEOUS | Status: DC
  Administered 2015-11-28 – 2015-11-30 (×3): 80 mg via SUBCUTANEOUS
  Filled 2015-11-28 (×3): qty 0.8

## 2015-11-28 MED ORDER — SACCHAROMYCES BOULARDII 250 MG PO CAPS
250.0000 mg | ORAL_CAPSULE | Freq: Two times a day (BID) | ORAL | Status: DC
  Administered 2015-11-28 – 2015-11-30 (×4): 250 mg via ORAL
  Filled 2015-11-28 (×5): qty 1

## 2015-11-28 MED ORDER — SODIUM CHLORIDE 0.9 % IV SOLN: 1000.0000 mL | INTRAVENOUS | Status: DC

## 2015-11-28 NOTE — Progress Notes (Signed)
PROGRESS NOTE  Henry Hinton  [id number] DOB: 06/26/1960 DOA: 11/27/2015 PCP: [name], MD  Brief Narrative:   Henry Hinton is a 55 y.o. male with medical history significant of CLL with leukocytosis, kidney stones, morbid obesity with OSA, who presents with abdominal pain and redness.  He underwent a left inguinal hernia repair with insertion of mesh on 06/01/2015.  He developed a post-operative hematoma that ruptured requiring surgical evaluation, wound vac application and healing by secondary intention.  He has had a residual fistula or nonhealing wound on his left lower abdomen since that time.  In June, he was hospitalized with sepsis due to abdominal wall cellulitis, not complicated by deeper tissue or mesh infection.  He completed two weeks of high dose bactrim after discharge and his symptoms resolved.  He awoke this morning and noticed a soreness or burning of the skin on the left mid-abdomen and when he looked, he noticed an area of redness in the same area similar to last month's cellulitis.  It did not appear to originate from around his fistula.  The redness has rapidly spread since early this morning and is now double in size and starting to surround his fistular area.  He denies recent fevers, chills, nausea, vomiting, diarrhea, or other systemic symptoms.    ED Course: VSS.  Labs notable for WBC 40,000 increased from his baseline of 25-30,000.  Lactic acid was mildly elevated at 2.37.  He is awaiting a CT of the abdomen and pelvis.  Patient received vancomycin and zosyn in the emergency department.    Assessment & Plan:   Principal Problem:   Cellulitis of trunk Active Problems:   Leukocytosis (leucocytosis)   CLL (chronic lymphocytic leukemia) (HCC)   Panniculitis   Venous stasis  Rapidly progressive cellulitis with mild sepsis:  Fever, tachycardia, leukocytosis, elevated lactic acid.  Worrisome how quickly this cellulitis spread initially, but appears to have  halted at the line drawn yesterday in the emergency department. 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    DVT prophylaxis: lovenox  Code Status: full Family Communication: patient and his mother at bedside  Disposition Plan: inpatient   Consultants:   none  Procedures:  none  Antimicrobials:   Vancomycin 7/29   Zosyn 7/29 >    Subjective:  Had a high fever overnight but is feeling a little better this morning.  The left edge of the cellulitic area is hurting more, but the redness stopped spreading.    Objective: Vitals:   11/27/15 1848 11/27/15 2125 11/27/15 2300 11/28/15 0630  BP: (!) 159/70 (!) 159/56  (!) 144/92  Pulse: 86 (!) 102  99  Resp: 18 19  20   Temp: 98 F (36.7 C) (!) 101.4 F (38.6 C) 100.1 F (37.8 C) 99.4 F (37.4 C)  TempSrc: Oral Oral    SpO2: 98% 95%  92%  Weight: (!) 177.8 kg (391 lb  15.7 oz)     Height: 5\' 9"  (1.753 m)       Intake/Output Summary (Last 24 hours) at 11/28/15 1335 Last data filed at 11/28/15 1113  Gross per 24 hour  Intake          1306.25 ml  Output             2350 ml  Net         -1043.75 ml   Filed Weights   11/27/15 1534 11/27/15 1848  Weight: (!) 177.8 kg (392 lb) (!) 177.8 kg (391 lb 15.7 oz)    Examination:  General exam:  Adult male,  No acute distress.  HEENT:  NCAT,  MMM Respiratory system: Clear to auscultation bilaterally Cardiovascular system: Regular rate and rhythm, normal S1/S2. No murmurs, rubs, gallops or clicks.  Warm extremities Gastrointestinal system: Normal active bowel sounds, soft, nondistended.  Panus has bright red erythema within the previously drawn line MSK:  Normal tone and bulk, 1+ pitting bilateral lower extremity edema Neuro:  Grossly intact    Data Reviewed: I have personally reviewed following labs and imaging studies  CBC:  Recent Labs Lab 11/27/15 1134 11/28/15 0317  WBC 40.3* 32.9*  NEUTROABS 9.3*  --   HGB 13.4 11.7*  HCT 41.2 38.6*  MCV 82.2 83.9  PLT 209 [id number]   Basic Metabolic Panel:  Recent Labs Lab 11/27/15 1134 11/28/15 0317  NA 138 137  K 4.6 4.4  CL 105 106  CO2 26 26  GLUCOSE 129* 125*  BUN 13 12  CREATININE 0.69 0.77  CALCIUM 8.4* 8.1*   GFR: Estimated Creatinine Clearance: 167.5 mL/min (by C-G formula based on SCr of 0.8 mg/dL). Liver Function Tests:  Recent Labs Lab 11/27/15 1134  AST 25  ALT 26  ALKPHOS 99  BILITOT 0.5  PROT 6.8  ALBUMIN 3.5   No results for input(s): LIPASE, AMYLASE in the last 168 hours. No results for input(s): AMMONIA in the last 168 hours. Coagulation Profile: No results for input(s): INR, PROTIME in the last 168 hours. Cardiac Enzymes: No results for input(s): CKTOTAL, CKMB, CKMBINDEX, TROPONINI in the last 168 hours. BNP (last 3 results) No results for input(s): PROBNP in the last 8760 hours. HbA1C: No results for input(s): HGBA1C in the last 72 hours. CBG: No results for input(s): GLUCAP in the last 168 hours. Lipid Profile: No results for input(s): CHOL, HDL, LDLCALC, TRIG, CHOLHDL, LDLDIRECT in the last 72 hours. Thyroid Function Tests: No results for input(s): TSH, T4TOTAL, FREET4, T3FREE, THYROIDAB in the last 72 hours. Anemia Panel: No results for input(s): VITAMINB12, FOLATE, FERRITIN, TIBC, IRON, RETICCTPCT in the last 72 hours. Urine  analysis:    Component Value Date/Time   COLORURINE YELLOW 11/27/2015 1140   APPEARANCEUR CLEAR 11/27/2015 1140   LABSPEC 1.022 11/27/2015 1140   PHURINE 6.5 11/27/2015 1140   GLUCOSEU NEGATIVE 11/27/2015 1140   HGBUR NEGATIVE 11/27/2015 1140   BILIRUBINUR NEGATIVE 11/27/2015 1140   BILIRUBINUR neg 12/06/2013 1446   KETONESUR NEGATIVE 11/27/2015 1140   PROTEINUR NEGATIVE 11/27/2015 1140   UROBILINOGEN 0.2 10/28/2014 1008   NITRITE NEGATIVE 11/27/2015 1140   LEUKOCYTESUR NEGATIVE 11/27/2015 1140   Sepsis Labs: @LABRCNTIP (procalcitonin:4,lacticidven:4)  ) Recent Results (from the past 240 hour(s))  Culture, blood (Routine x 2)     Status: None (Preliminary result)   Collection Time: 11/27/15 11:34 AM  Result Value Ref Range Status   Specimen Description BLOOD LEFT ANTECUBITAL  Final   Special Requests BOTTLES DRAWN AEROBIC  AND ANAEROBIC 5CC  Final   Culture NO GROWTH < 24 HOURS  Final   Report Status PENDING  Incomplete  Urine culture     Status: Abnormal   Collection Time: 11/27/15 11:40 AM  Result Value Ref Range Status   Specimen Description URINE, RANDOM  Final   Special Requests NONE  Final   Culture MULTIPLE SPECIES PRESENT, SUGGEST RECOLLECTION (A)  Final   Report Status 11/28/2015 FINAL  Final  Culture, blood (Routine x 2)     Status: None (Preliminary result)   Collection Time: 11/27/15  1:27 PM  Result Value Ref Range Status   Specimen Description BLOOD RIGHT ANTECUBITAL  Final   Special Requests BOTTLES DRAWN AEROBIC ONLY 6CC  Final   Culture NO GROWTH < 24 HOURS  Final   Report Status PENDING  Incomplete      Radiology Studies: Ct Abdomen Pelvis W Contrast  Result Date: 11/27/2015 CLINICAL DATA:  55 year old male with abdominal pain, swelling and redness. History of left inguinal hernia repair on 06/01/2015 with postoperative hematoma, surgical evaluation residual fistula and nonhealing wound. EXAM: CT ABDOMEN AND PELVIS WITH CONTRAST TECHNIQUE:  Multidetector CT imaging of the abdomen and pelvis was performed using the standard protocol following bolus administration of intravenous contrast. CONTRAST:  158mL ISOVUE-300 IOPAMIDOL (ISOVUE-300) INJECTION 61% COMPARISON:  10/09/2015 and prior CTs FINDINGS: Lower chest:  Unremarkable Hepatobiliary: Hepatic steatosis again noted. The gallbladder is unremarkable. There is no evidence of biliary dilatation. Pancreas: Unremarkable Spleen: Mild splenomegaly again noted. Adrenals/Urinary Tract: Punctate nonobstructing bilateral renal calculi and probable left renal cysts again noted. The adrenal glands and bladder are unremarkable. Stomach/Bowel: Unremarkable. There is no evidence of bowel obstruction, definite bowel wall thickening for pneumoperitoneum. Vascular/Lymphatic: Abdominal aortic atherosclerotic calcifications noted without aneurysm. No enlarged lymph nodes identified. Reproductive: Unremarkable Other: Skin thickening and mild subcutaneous inflammation in the anterior lower abdomen and pelvis noted. There is no evidence of associated abscess. Surgical changes in the left inguinal region are noted. No free fluid or abscess identified. A small to moderate right inguinal hernia containing fat is noted. A small paraumbilical hernia containing fat again noted. Musculoskeletal: No acute abnormalities noted. IMPRESSION: Skin thickening and mild subcutaneous inflammation in the anterior lower abdomen and pelvis compatible with cellulitis. No evidence of abscess. Punctate nonobstructing bilateral renal calculi, mild splenomegaly and hepatic steatosis. Small to moderate right inguinal hernia containing fat and small paraumbilical hernia containing fat again noted. Abdominal aortic atherosclerosis. Electronically Signed   By: Margarette Canada M.D.   On: 11/27/2015 17:30    Scheduled Meds: . aspirin EC  81 mg Oral Daily  . enoxaparin (LOVENOX) injection  80 mg Subcutaneous Q24H  . ferrous sulfate  325 mg Oral Q  breakfast  . multivitamin with minerals  1 tablet Oral Daily  . piperacillin-tazobactam (ZOSYN)  IV  3.375 g Intravenous Q8H  . polyethylene glycol  17 g Oral Daily  . saccharomyces boulardii  250 mg Oral BID  . vancomycin  1,500 mg Intravenous Q12H   Continuous Infusions: . sodium chloride       LOS: 0 days    Time spent: 30 min    Janece Canterbury, MD [facility] Pager [phone]  If 7PM-7AM, please contact night-coverage [email or web address] Password [email or web address] 11/28/2015, 1:35 PM

## 2015-11-29 LAB — BASIC METABOLIC PANEL
Anion gap: 5 (ref 5–15)
BUN: 8 mg/dL (ref 6–20)
CALCIUM: 7.9 mg/dL — AB (ref 8.9–10.3)
CO2: 25 mmol/L (ref 22–32)
CREATININE: 0.7 mg/dL (ref 0.61–1.24)
Chloride: 109 mmol/L (ref 101–111)
GFR calc non Af Amer: >60 mL/min (ref >60)
Glucose, Bld: 135 mg/dL — ABNORMAL HIGH (ref 65–99)
Potassium: 4 mmol/L (ref 3.5–5.1)
SODIUM: 139 mmol/L (ref 135–145)

## 2015-11-29 LAB — CBC
HCT: 36.2 % — ABNORMAL LOW (ref 39.0–52.0)
Hemoglobin: 11.1 g/dL — ABNORMAL LOW (ref 13.0–17.0)
MCH: 25.8 pg — AB (ref 26.0–34.0)
MCHC: 30.7 g/dL (ref 30.0–36.0)
MCV: 84 fL (ref 78.0–100.0)
PLATELETS: 159 10*3/uL (ref 150–400)
RBC: 4.31 MIL/uL (ref 4.22–5.81)
RDW: 18 % — ABNORMAL HIGH (ref 11.5–15.5)
WBC: 28 10*3/uL — AB (ref 4.0–10.5)

## 2015-11-29 LAB — PATHOLOGIST SMEAR REVIEW

## 2015-11-29 MED ORDER — CEPHALEXIN 500 MG PO CAPS: 500.0000 mg | ORAL_CAPSULE | Freq: Four times a day (QID) | ORAL | 0 refills | Status: DC

## 2015-11-29 MED ORDER — SULFAMETHOXAZOLE-TRIMETHOPRIM 800-160 MG PO TABS
2.0000 | ORAL_TABLET | Freq: Two times a day (BID) | ORAL | 0 refills | Status: DC
Start: 2015-11-29 — End: 2015-12-16

## 2015-11-29 MED ORDER — SACCHAROMYCES BOULARDII 250 MG PO CAPS: 250.0000 mg | ORAL_CAPSULE | Freq: Two times a day (BID) | ORAL | 3 refills | Status: DC

## 2015-11-29 NOTE — Discharge Summary (Signed)
Physician Discharge Summary  Henry Hinton [id number] DOB: [birth year] DOA: 11/27/2015  PCP: [name], MD  Admit date: 11/27/2015 Discharge date: 11/30/2015  Admitted From: home  Disposition:  home  Recommendations for Outpatient Follow-up:  1. Follow up with PCP in 1-2 weeks 2. Please obtain BMP/CBC in one week 3. Please follow up on the following pending results:  Blood culture  Home Health:  none  Equipment/Devices:  none   Discharge Condition:  Stable, improved CODE STATUS:  full  Diet recommendation:  Healthy heart  Brief/Interim Summary:  Henry Hinton a 55 y.o.malewith medical history significant of CLL with leukocytosis, kidney stones, morbid obesity with OSA, who presented with abdominal pain and redness. He underwent a left inguinal hernia repair with insertion of mesh on 06/01/2015. He developed a post-operative hematoma that ruptured requiring surgical evaluation, wound vac application and healing by secondary intention. He has had a residual fistula or nonhealing wound on his left lower abdomen since that time. In June, he was hospitalized with sepsis due to abdominal wall cellulitis, not complicated by deeper tissue or mesh infection. He completed two weeks of high dose bactrim after discharge and his symptoms resolved. He presents with recurrent abdominal cellulitis/panniculitis, not originating from around his fistula. The redness has rapidly spread since early this morning and is now double in size and starting to surround his fistular area. He developed fevers.  He was started on vancomycin and zosyn and had gradual improvement in his cellulitis.    Discharge Diagnoses:  Principal Problem:   Cellulitis of trunk Active Problems:   Leukocytosis (leucocytosis)   CLL (chronic lymphocytic leukemia) (HCC)   Sepsis (HCC)   Panniculitis   Venous stasis   Cellulitis  Rapidly progressive cellulitiswith mild sepsis:  Fever, tachycardia, leukocytosis,  elevated lactic acid improved with IV antibiotics.  CT confirmed there was no involvement of the fistula nor any abscess formation.  His blood cultures remained no growth.  He should continue antibiotics for a total of 2 weeks.  Given the rapid development of his cellulitis and the recurrence after last month's infection, recommend dual therapy with Bactrim and keflex for both staph and strep coverage.  Given his repeated courses of antibiotics, he is at risk for C. Diff infection and therefore I did not choose clindamycin or levofloxacin for this cellulitis.  I also prescribed florastor to reduce his risk of C. Diff infection.  He should follow up with his primary care doctor in 1 week for reevaluation.    CLL, predisposes to infection, baseline WBC 25-30,000, trended down to baseline with antibiotics.    Nonhealing fistularelated to left inguinal hernia repair.  CT confirmed there was no involvement of the fistula nor any abscess formation.  OSA, stable, continue CPAP  Chronic constipation, stable, continued miralax  Iron deficiency anemia, hemoglobin stable, continued iron supplementation  Venous stasis, unable to tolerate TED hose.  Held lasix initially due to concern for sepsis, but resumed at discharge.    Morbid obesity, defer counseling to his PCP.    Discharge Instructions  Discharge Instructions    Call MD for:  difficulty breathing, headache or visual disturbances    Complete by:  As directed   Call MD for:  extreme fatigue    Complete by:  As directed   Call MD for:  hives    Complete by:  As directed   Call MD for:  persistant dizziness or light-headedness    Complete by:  As directed   Call  MD for:  persistant nausea and vomiting    Complete by:  As directed   Call MD for:  redness, tenderness, or signs of infection (pain, swelling, redness, odor or green/yellow discharge around incision site)    Complete by:  As directed   Call MD for:  severe uncontrolled pain     Complete by:  As directed   Call MD for:  temperature >100.4    Complete by:  As directed   Diet - low sodium heart healthy    Complete by:  As directed   Discharge instructions    Complete by:  As directed   Continue Bactrim and Keflex to complete a 2 week course of antibiotics.  If you develop worsening pain, redness, swelling, or fevers, please return immediately to the hospital.  You are at risk for infectious diarrhea because you have been repeatedly exposed to potent and long term antibiotics.  Please take florastor or another probiotic to reduce your risk of infectious diarrhea.  If you develop severe watery diarrhea anytime in the next three months, please seek immediate medical attention because infectious diarrhea can be fatal if left untreated.   Increase activity slowly    Complete by:  As directed       Medication List    TAKE these medications   aspirin EC 81 MG tablet Take 81 mg by mouth daily.   cephALEXin 500 MG capsule Commonly known as:  KEFLEX Take 1 capsule (500 mg total) by mouth 4 (four) times daily.   furosemide 40 MG tablet Commonly known as:  LASIX Take 1 tablet (40 mg total) by mouth daily.   ibuprofen 200 MG tablet Commonly known as:  ADVIL,MOTRIN Take 200 mg by mouth every 6 (six) hours as needed for moderate pain.   IRON SUPPLEMENT 325 (65 FE) MG tablet Generic drug:  ferrous sulfate Take 325 mg by mouth daily with breakfast.   multivitamin with minerals tablet Take 1 tablet by mouth daily.   polyethylene glycol packet Commonly known as:  MIRALAX / GLYCOLAX Take 17 g by mouth daily.   potassium chloride 10 MEQ tablet Commonly known as:  KLOR-CON 10 TAKE 1 TABLET (10 MEQ TOTAL) BY MOUTH DAILY.   saccharomyces boulardii 250 MG capsule Commonly known as:  FLORASTOR Take 1 capsule (250 mg total) by mouth 2 (two) times daily.   sulfamethoxazole-trimethoprim 800-160 MG tablet Commonly known as:  BACTRIM DS,SEPTRA DS Take 2 tablets by mouth 2  (two) times daily.      Follow-up Information    GREENE,JEFFREY R, MD Follow up in 1 week(s).   Specialties:  Family Medicine, Sports Medicine Contact information: [place] [id number] [phone]          Allergies  Allergen Reactions  . Adhesive [Tape] Other (See Comments)    Blisters and rash at site    Consultations: none    Procedures/Studies: Ct Abdomen Pelvis W Contrast  Result Date: 11/27/2015 CLINICAL DATA:  55 year old male with abdominal pain, swelling and redness. History of left inguinal hernia repair on 06/01/2015 with postoperative hematoma, surgical evaluation residual fistula and nonhealing wound. EXAM: CT ABDOMEN AND PELVIS WITH CONTRAST TECHNIQUE: Multidetector CT imaging of the abdomen and pelvis was performed using the standard protocol following bolus administration of intravenous contrast. CONTRAST:  18mL ISOVUE-300 IOPAMIDOL (ISOVUE-300) INJECTION 61% COMPARISON:  10/09/2015 and prior CTs FINDINGS: Lower chest:  Unremarkable Hepatobiliary: Hepatic steatosis again noted. The gallbladder is unremarkable. There is no evidence of biliary dilatation.  Pancreas: Unremarkable Spleen: Mild splenomegaly again noted. Adrenals/Urinary Tract: Punctate nonobstructing bilateral renal calculi and probable left renal cysts again noted. The adrenal glands and bladder are unremarkable. Stomach/Bowel: Unremarkable. There is no evidence of bowel obstruction, definite bowel wall thickening for pneumoperitoneum. Vascular/Lymphatic: Abdominal aortic atherosclerotic calcifications noted without aneurysm. No enlarged lymph nodes identified. Reproductive: Unremarkable Other: Skin thickening and mild subcutaneous inflammation in the anterior lower abdomen and pelvis noted. There is no evidence of associated abscess. Surgical changes in the left inguinal region are noted. No free fluid or abscess identified. A small to moderate right inguinal hernia containing fat is noted.  A small paraumbilical hernia containing fat again noted. Musculoskeletal: No acute abnormalities noted. IMPRESSION: Skin thickening and mild subcutaneous inflammation in the anterior lower abdomen and pelvis compatible with cellulitis. No evidence of abscess. Punctate nonobstructing bilateral renal calculi, mild splenomegaly and hepatic steatosis. Small to moderate right inguinal hernia containing fat and small paraumbilical hernia containing fat again noted. Abdominal aortic atherosclerosis. Electronically Signed   By: Margarette Canada M.D.   On: 11/27/2015 17:30    Subjective:  Redness is becoming fainter.  Denies fevers, chills.    Discharge Exam: Vitals:   11/30/15 0435 11/30/15 1318  BP: 125/74 (!) 155/87  Pulse: 64 62  Resp: 19 18  Temp: 97.7 F (36.5 C) 97.6 F (36.4 C)   Vitals:   11/29/15 1455 11/29/15 2246 11/30/15 0435 11/30/15 1318  BP: 130/71 128/72 125/74 (!) 155/87  Pulse: 76 62 64 62  Resp: 19 19 19 18   Temp: 98 F (36.7 C) 98.7 F (37.1 C) 97.7 F (36.5 C) 97.6 F (36.4 C)  TempSrc: Oral Oral Oral Oral  SpO2: 96% 99% 97% 98%  Weight:      Height:        General exam:  Adult male,  No acute distress.  HEENT:  NCAT, MMM Respiratory system: Clear to auscultation bilaterally Cardiovascular system: Regular rate and rhythm, normal S1/S2. No murmurs, rubs, gallops or clicks.  Warm extremities Gastrointestinal system: Normal active bowel sounds, soft, nondistended.  Panus has dark red erythema within the previously drawn line, which is becoming fainter along the right and left margins. MSK:  Normal tone and bulk, 1+ pitting bilateral lower extremity edema Neuro:  Grossly intact    The results of significant diagnostics from this hospitalization (including imaging, microbiology, ancillary and laboratory) are listed below for reference.     Microbiology: Recent Results (from the past 240 hour(s))  Culture, blood (Routine x 2)     Status: None (Preliminary result)    Collection Time: 11/27/15 11:34 AM  Result Value Ref Range Status   Specimen Description BLOOD LEFT ANTECUBITAL  Final   Special Requests BOTTLES DRAWN AEROBIC AND ANAEROBIC 5CC  Final   Culture NO GROWTH 2 DAYS  Final   Report Status PENDING  Incomplete  Urine culture     Status: Abnormal   Collection Time: 11/27/15 11:40 AM  Result Value Ref Range Status   Specimen Description URINE, RANDOM  Final   Special Requests NONE  Final   Culture MULTIPLE SPECIES PRESENT, SUGGEST RECOLLECTION (A)  Final   Report Status 11/28/2015 FINAL  Final  Culture, blood (Routine x 2)     Status: None (Preliminary result)   Collection Time: 11/27/15  1:27 PM  Result Value Ref Range Status   Specimen Description BLOOD RIGHT ANTECUBITAL  Final   Special Requests BOTTLES DRAWN AEROBIC ONLY 6CC  Final   Culture NO GROWTH 2  DAYS  Final   Report Status PENDING  Incomplete     Labs: BNP (last 3 results) No results for input(s): BNP in the last 8760 hours. Basic Metabolic Panel:  Recent Labs Lab 11/27/15 1134 11/28/15 0317 11/29/15 0419 11/30/15 0502  NA 138 137 139 136  K 4.6 4.4 4.0 4.0  CL 105 106 109 107  CO2 26 26 25 23   GLUCOSE 129* 125* 135* 140*  BUN 13 12 8 8   CREATININE 0.69 0.77 0.70 0.64  CALCIUM 8.4* 8.1* 7.9* 8.0*   Liver Function Tests:  Recent Labs Lab 11/27/15 1134  AST 25  ALT 26  ALKPHOS 99  BILITOT 0.5  PROT 6.8  ALBUMIN 3.5   No results for input(s): LIPASE, AMYLASE in the last 168 hours. No results for input(s): AMMONIA in the last 168 hours. CBC:  Recent Labs Lab 11/27/15 1134 11/28/15 0317 11/29/15 0419 11/30/15 0502  WBC 40.3* 32.9* 28.0* 28.4*  NEUTROABS 9.3*  --   --   --   HGB 13.4 11.7* 11.1* 11.2*  HCT 41.2 38.6* 36.2* 36.4*  MCV 82.2 83.9 84.0 84.5  PLT 209 171 159 158   Cardiac Enzymes: No results for input(s): CKTOTAL, CKMB, CKMBINDEX, TROPONINI in the last 168 hours. BNP: Invalid input(s): POCBNP CBG: No results for input(s): GLUCAP in  the last 168 hours. D-Dimer No results for input(s): DDIMER in the last 72 hours. Hgb A1c No results for input(s): HGBA1C in the last 72 hours. Lipid Profile No results for input(s): CHOL, HDL, LDLCALC, TRIG, CHOLHDL, LDLDIRECT in the last 72 hours. Thyroid function studies No results for input(s): TSH, T4TOTAL, T3FREE, THYROIDAB in the last 72 hours.  Invalid input(s): FREET3 Anemia work up No results for input(s): VITAMINB12, FOLATE, FERRITIN, TIBC, IRON, RETICCTPCT in the last 72 hours. Urinalysis    Component Value Date/Time   COLORURINE YELLOW 11/27/2015 1140   APPEARANCEUR CLEAR 11/27/2015 1140   LABSPEC 1.022 11/27/2015 1140   PHURINE 6.5 11/27/2015 1140   GLUCOSEU NEGATIVE 11/27/2015 1140   HGBUR NEGATIVE 11/27/2015 1140   BILIRUBINUR NEGATIVE 11/27/2015 1140   BILIRUBINUR neg 12/06/2013 1446   KETONESUR NEGATIVE 11/27/2015 1140   PROTEINUR NEGATIVE 11/27/2015 1140   UROBILINOGEN 0.2 10/28/2014 1008   NITRITE NEGATIVE 11/27/2015 1140   LEUKOCYTESUR NEGATIVE 11/27/2015 1140   Sepsis Labs Invalid input(s): PROCALCITONIN,  WBC,  LACTICIDVEN   Time coordinating discharge: Over 30 minutes  SIGNED:   Janece Canterbury, MD  [facility] 11/30/2015, 1:34 PM Pager   If 7PM-7AM, please contact night-coverage [email or web address] Password [email or web address]

## 2015-11-29 NOTE — Discharge Instructions (Signed)

## 2015-11-29 NOTE — Progress Notes (Signed)
PROGRESS NOTE  Henry Hinton  [id number] DOB: December 30, 1960 DOA: 11/27/2015 PCP: [name], MD  Brief Narrative:   Henry Hinton is a 55 y.o. male with medical history significant of CLL with leukocytosis, kidney stones, morbid obesity with OSA, who presented with abdominal pain and redness.  He underwent a left inguinal hernia repair with insertion of mesh on 06/01/2015.  He developed a post-operative hematoma that ruptured requiring surgical evaluation, wound vac application and healing by secondary intention.  He has had a residual fistula or nonhealing wound on his left lower abdomen since that time.  In June, he was hospitalized with sepsis due to abdominal wall cellulitis, not complicated by deeper tissue or mesh infection.  He completed two weeks of high dose bactrim after discharge and his symptoms resolved.  He presents with recurrent abdominal cellulitis/panniculitis, not originating from around his fistula.  The redness has rapidly spread since early this morning and is now double in size and starting to surround his fistular area.  He had fevers.  He is starting to show some signs of improvement on IV antibiotics.      Assessment & Plan:   Principal Problem:   Cellulitis of trunk Active Problems:   Leukocytosis (leucocytosis)   CLL (chronic lymphocytic leukemia) (HCC)   Panniculitis   Venous stasis   Cellulitis  Rapidly progressive cellulitis with mild sepsis:  Fever, tachycardia, leukocytosis, elevated lactic acid.  Worrisome how quickly this cellulitis spread initially, but appears to have halted and some areas of erythema appear less red.  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    DVT prophylaxis: lovenox  Code Status: full Family Communication: patient alone Disposition Plan:  If showing definite signs of improvement tomorrow, anticipate transitioning to oral antibiotics, possibly combination bactrim/keflex to complete another 14-day course of antibiotics.    Consultants:   none  Procedures:  none  Antimicrobials:   Vancomycin 7/29   Zosyn 7/29 >    Subjective:  Low grade fever again last night.  Redness has stopped spreading and pain is marginally improved today.    Objective: Vitals:   11/27/15 2300 11/28/15 0630 11/28/15 2052 11/29/15 0421  BP:  (!) 144/92 133/62 122/73  Pulse:  99 78 78  Resp:  20 19 19   Temp: 100.1 F (37.8 C) 99.4 F (37.4 C) 98.7 F (37.1 C) 98.1 F (36.7 C)  TempSrc:   Oral Oral  SpO2:  92% 92% 94%  Weight:      Height:        Intake/Output Summary (Last 24 hours) at 11/29/15 1515 Last data filed at 11/29/15 1400  Gross per 24 hour  Intake          2202.42 ml  Output             1900 ml  Net  302.42 ml   Filed Weights   11/27/15 1534 11/27/15 1848  Weight: (!) 177.8 kg (392 lb) (!) 177.8 kg (391 lb 15.7 oz)    Examination:  General exam:  Adult male,  No acute distress.  HEENT:  NCAT, MMM Respiratory system: Clear to auscultation bilaterally Cardiovascular system: Regular rate and rhythm, normal S1/S2. No murmurs, rubs, gallops or clicks.  Warm extremities Gastrointestinal system: Normal active bowel sounds, soft, nondistended.  Panus has darker red erythema within the previously drawn line, improved some along the right and left  margins. MSK:  Normal tone and bulk, 1+ pitting bilateral lower extremity edema Neuro:  Grossly intact    Data Reviewed: I have personally reviewed following labs and imaging studies  CBC:  Recent Labs Lab 11/27/15 1134 11/28/15 0317 11/29/15 0419  WBC 40.3* 32.9* 28.0*  NEUTROABS 9.3*  --   --   HGB 13.4 11.7* 11.1*  HCT 41.2 38.6* 36.2*  MCV 82.2 83.9 84.0  PLT 209 171 [id number]   Basic Metabolic Panel:  Recent Labs Lab 11/27/15 1134 11/28/15 0317 11/29/15 0419  NA 138 137 139  K 4.6 4.4 4.0  CL 105 106 109  CO2 26 26 25   GLUCOSE 129* 125* 135*  BUN 13 12 8   CREATININE 0.69 0.77 0.70  CALCIUM 8.4* 8.1* 7.9*   GFR: Estimated Creatinine Clearance: 167.5 mL/min (by C-G formula based on SCr of 0.8 mg/dL). Liver Function Tests:  Recent Labs Lab 11/27/15 1134  AST 25  ALT 26  ALKPHOS 99  BILITOT 0.5  PROT 6.8  ALBUMIN 3.5   No results for input(s): LIPASE, AMYLASE in the last 168 hours. No results for input(s): AMMONIA in the last 168 hours. Coagulation Profile: No results for input(s): INR, PROTIME in the last 168 hours. Cardiac Enzymes: No results for input(s): CKTOTAL, CKMB, CKMBINDEX, TROPONINI in the last 168 hours. BNP (last 3 results) No results for input(s): PROBNP in the last 8760 hours. HbA1C: No results for input(s): HGBA1C in the last 72 hours. CBG: No results for input(s): GLUCAP in the last 168 hours. Lipid Profile: No results for input(s): CHOL, HDL, LDLCALC, TRIG, CHOLHDL, LDLDIRECT in the last 72 hours. Thyroid Function Tests: No results for input(s): TSH, T4TOTAL, FREET4, T3FREE, THYROIDAB in the last 72 hours. Anemia Panel: No results for input(s): VITAMINB12, FOLATE, FERRITIN, TIBC, IRON, RETICCTPCT in the last 72 hours. Urine analysis:    Component Value Date/Time   COLORURINE YELLOW 11/27/2015 1140   APPEARANCEUR CLEAR 11/27/2015 1140   LABSPEC 1.022 11/27/2015 1140   PHURINE 6.5 11/27/2015 1140   GLUCOSEU NEGATIVE 11/27/2015  1140   HGBUR NEGATIVE 11/27/2015 1140   BILIRUBINUR NEGATIVE 11/27/2015 1140   BILIRUBINUR neg 12/06/2013 1446   KETONESUR NEGATIVE 11/27/2015 1140   PROTEINUR NEGATIVE 11/27/2015 1140   UROBILINOGEN 0.2 10/28/2014 1008   NITRITE NEGATIVE 11/27/2015 1140   LEUKOCYTESUR NEGATIVE 11/27/2015 1140   Sepsis Labs: @LABRCNTIP (procalcitonin:4,lacticidven:4)  ) Recent Results (from the past 240 hour(s))  Culture, blood (Routine x 2)     Status: None (Preliminary result)   Collection Time: 11/27/15 11:34 AM  Result Value Ref Range Status   Specimen Description BLOOD LEFT ANTECUBITAL  Final   Special Requests BOTTLES DRAWN AEROBIC AND ANAEROBIC 5CC  Final   Culture NO GROWTH < 24 HOURS  Final   Report Status PENDING  Incomplete  Urine culture     Status: Abnormal   Collection Time: 11/27/15 11:40 AM  Result Value Ref Range Status   Specimen  Description URINE, RANDOM  Final   Special Requests NONE  Final   Culture MULTIPLE SPECIES PRESENT, SUGGEST RECOLLECTION (A)  Final   Report Status 11/28/2015 FINAL  Final  Culture, blood (Routine x 2)     Status: None (Preliminary result)   Collection Time: 11/27/15  1:27 PM  Result Value Ref Range Status   Specimen Description BLOOD RIGHT ANTECUBITAL  Final   Special Requests BOTTLES DRAWN AEROBIC ONLY 6CC  Final   Culture NO GROWTH < 24 HOURS  Final   Report Status PENDING  Incomplete      Radiology Studies: Ct Abdomen Pelvis W Contrast  Result Date: 11/27/2015 CLINICAL DATA:  55 year old male with abdominal pain, swelling and redness. History of left inguinal hernia repair on 06/01/2015 with postoperative hematoma, surgical evaluation residual fistula and nonhealing wound. EXAM: CT ABDOMEN AND PELVIS WITH CONTRAST TECHNIQUE: Multidetector CT imaging of the abdomen and pelvis was performed using the standard protocol following bolus administration of intravenous contrast. CONTRAST:  143mL ISOVUE-300 IOPAMIDOL (ISOVUE-300) INJECTION 61%  COMPARISON:  10/09/2015 and prior CTs FINDINGS: Lower chest:  Unremarkable Hepatobiliary: Hepatic steatosis again noted. The gallbladder is unremarkable. There is no evidence of biliary dilatation. Pancreas: Unremarkable Spleen: Mild splenomegaly again noted. Adrenals/Urinary Tract: Punctate nonobstructing bilateral renal calculi and probable left renal cysts again noted. The adrenal glands and bladder are unremarkable. Stomach/Bowel: Unremarkable. There is no evidence of bowel obstruction, definite bowel wall thickening for pneumoperitoneum. Vascular/Lymphatic: Abdominal aortic atherosclerotic calcifications noted without aneurysm. No enlarged lymph nodes identified. Reproductive: Unremarkable Other: Skin thickening and mild subcutaneous inflammation in the anterior lower abdomen and pelvis noted. There is no evidence of associated abscess. Surgical changes in the left inguinal region are noted. No free fluid or abscess identified. A small to moderate right inguinal hernia containing fat is noted. A small paraumbilical hernia containing fat again noted. Musculoskeletal: No acute abnormalities noted. IMPRESSION: Skin thickening and mild subcutaneous inflammation in the anterior lower abdomen and pelvis compatible with cellulitis. No evidence of abscess. Punctate nonobstructing bilateral renal calculi, mild splenomegaly and hepatic steatosis. Small to moderate right inguinal hernia containing fat and small paraumbilical hernia containing fat again noted. Abdominal aortic atherosclerosis. Electronically Signed   By: Margarette Canada M.D.   On: 11/27/2015 17:30    Scheduled Meds: . aspirin EC  81 mg Oral Daily  . enoxaparin (LOVENOX) injection  80 mg Subcutaneous Q24H  . ferrous sulfate  325 mg Oral Q breakfast  . multivitamin with minerals  1 tablet Oral Daily  . piperacillin-tazobactam (ZOSYN)  IV  3.375 g Intravenous Q8H  . polyethylene glycol  17 g Oral Daily  . saccharomyces boulardii  250 mg Oral BID  .  vancomycin  1,500 mg Intravenous Q12H   Continuous Infusions:     LOS: 1 day    Time spent: 30 min    Janece Canterbury, MD [facility] Pager [phone]  If 7PM-7AM, please contact night-coverage [email or web address] Password [email or web address] 11/29/2015, 3:15 PM

## 2015-11-29 NOTE — Plan of Care (Signed)
Problem: Skin Integrity: Goal: Skin integrity will improve Outcome: Progressing Area of cellulitis to abdomen reducing, signs of healing noted  Problem: Activity: Goal: Risk for activity intolerance will decrease Outcome: Progressing Pt is ad lib in room

## 2015-11-30 ENCOUNTER — Encounter: Payer: Self-pay | Admitting: Family Medicine

## 2015-11-30 ENCOUNTER — Encounter (HOSPITAL_COMMUNITY): Payer: Self-pay | Admitting: General Practice

## 2015-11-30 DIAGNOSIS — L0390 Cellulitis, unspecified: Secondary | ICD-10-CM

## 2015-11-30 DIAGNOSIS — M793 Panniculitis, unspecified: Secondary | ICD-10-CM

## 2015-11-30 HISTORY — DX: Cellulitis, unspecified: L03.90

## 2015-11-30 LAB — BASIC METABOLIC PANEL
ANION GAP: 6 (ref 5–15)
BUN: 8 mg/dL (ref 6–20)
CALCIUM: 8 mg/dL — AB (ref 8.9–10.3)
CO2: 23 mmol/L (ref 22–32)
Chloride: 107 mmol/L (ref 101–111)
Creatinine, Ser: 0.64 mg/dL (ref 0.61–1.24)
Glucose, Bld: 140 mg/dL — ABNORMAL HIGH (ref 65–99)
POTASSIUM: 4 mmol/L (ref 3.5–5.1)
Sodium: 136 mmol/L (ref 135–145)

## 2015-11-30 LAB — CBC
HEMATOCRIT: 36.4 % — AB (ref 39.0–52.0)
HEMOGLOBIN: 11.2 g/dL — AB (ref 13.0–17.0)
MCH: 26 pg (ref 26.0–34.0)
MCHC: 30.8 g/dL (ref 30.0–36.0)
MCV: 84.5 fL (ref 78.0–100.0)
Platelets: 158 10*3/uL (ref 150–400)
RBC: 4.31 MIL/uL (ref 4.22–5.81)
RDW: 17.8 % — ABNORMAL HIGH (ref 11.5–15.5)
WBC: 28.4 10*3/uL — ABNORMAL HIGH (ref 4.0–10.5)

## 2015-11-30 LAB — VANCOMYCIN, TROUGH: VANCOMYCIN TR: 12 ug/mL — AB (ref 15–20)

## 2015-11-30 NOTE — Progress Notes (Signed)
Pharmacy Antibiotic Note  Henry Hinton is a 55 y.o. male admitted on 11/27/2015 with cellulitis.  Starting vancomycin and zosyn.  Day #4 of broad abx for severe cellulitis of abdomen. CT showed no evidence of abscess. Had surgery [id number] w/ post-op complications, hematoma w/ surgical eval, wound vac, had fistula/non-healing wound on L lower abd. New cellulitis does NOT originate from around his fistula. Afebrile, WBC elevated but down to 28. VT today was drawn correctly and is therapeutic at 12.  Plan:  Continue Zosyn 3.375 gm IV q8h (4 hour infusion) Continue vancomycin 1.5g IV Q12h (Goal trough 10-70mcg/mL) Monitor clinical picture, renal function, VT at Css F/U C&S, abx deescalation / LOT   Height: 5\' 9"  (175.3 cm) Weight: (!) 391 lb 15.7 oz (177.8 kg) IBW/kg (Calculated) : 70.7  Temp (24hrs), Avg:98.1 F (36.7 C), Min:97.7 F (36.5 C), Max:98.7 F (37.1 C)   Recent Labs Lab 11/27/15 1134 11/27/15 1157 11/27/15 1511 11/28/15 0317 11/29/15 0419 11/30/15 0502 11/30/15 0659  WBC 40.3*  --   --  32.9* 28.0* 28.4*  --   CREATININE 0.69  --   --  0.77 0.70 0.64  --   LATICACIDVEN  --  2.63* 2.21*  --   --   --   --   VANCOTROUGH  --   --   --   --   --   --  12*    Estimated Creatinine Clearance: 167.5 mL/min (by C-G formula based on SCr of 0.8 mg/dL).    Allergies  Allergen Reactions  . Adhesive [Tape] Other (See Comments)    Blisters and rash at site    Antimicrobials this admission: 7/29 vanc >> 7/29 zosyn >>  Dose adjustments this admission:    8/1 VT = 12 (Continue 1.5g IV Q12)   Microbiology results: 7/29 blood cx: ngtd 7/29 urine cx: multiple species present  Thank you for allowing pharmacy to be a part of this patient's care.  Reginia Naas 11/30/2015 7:54 AM

## 2015-11-30 NOTE — Progress Notes (Signed)
D/C papers gone over with pt. No questions/complaints. Pt. Knows to follow up with PCP in 1 week. IV taken out.

## 2015-11-30 NOTE — Progress Notes (Signed)
Per Pharmacy it is okay to go ahead and hang 0800 Vancomycin.

## 2015-12-02 LAB — CULTURE, BLOOD (ROUTINE X 2)
CULTURE: NO GROWTH
CULTURE: NO GROWTH

## 2015-12-16 ENCOUNTER — Ambulatory Visit (INDEPENDENT_AMBULATORY_CARE_PROVIDER_SITE_OTHER): Payer: BC Managed Care – PPO | Admitting: Family Medicine

## 2015-12-16 ENCOUNTER — Encounter: Payer: Self-pay | Admitting: Family Medicine

## 2015-12-16 VITALS — BP 142/88 | HR 69 | Temp 97.9°F | Resp 20 | Ht 69.0 in | Wt 395.4 lb

## 2015-12-16 DIAGNOSIS — C9110 Chronic lymphocytic leukemia of B-cell type not having achieved remission: Secondary | ICD-10-CM

## 2015-12-16 DIAGNOSIS — L03311 Cellulitis of abdominal wall: Secondary | ICD-10-CM | POA: Diagnosis not present

## 2015-12-16 DIAGNOSIS — E65 Localized adiposity: Secondary | ICD-10-CM

## 2015-12-16 NOTE — Patient Instructions (Addendum)
  [place] or [place] for men to help lessen drying of skin of abdomen. If any recurrence of cellulitis, I would consider having infectious disease advise [place] on other management options. Follow up with me as planned in October. Sooner if worse.    IF you received an x-ray today, you will receive an invoice from [facility]. Please contact [facility] at [phone] with questions or concerns regarding your invoice.   IF you received labwork today, you will receive an invoice from [employer]. Please contact [facility] at [phone] with questions or concerns regarding your invoice.   Our billing staff will not be able to assist you with questions regarding bills from these companies.  You will be contacted with the lab results as soon as they are available. The fastest way to get your results is to activate your My Chart account. Instructions are located on the last page of this paperwork. If you have not heard from [place] regarding the results in 2 weeks, please contact this office.

## 2015-12-16 NOTE — Progress Notes (Signed)
By signing my name below, I, [name], attest that this documentation has been prepared under the direction and in the presence of [name], MD.  Electronically Signed: [name], Medical Scribe. 12/16/15. 12:30 PM.  Subjective:    Patient ID: Henry Hinton, male    DOB: 04/14/1961, 55 y.o.   MRN: [id number]  HPI Chief Complaint  Patient presents with  . Follow-up    hospital follow up    HPI Comments: Josecarlos Harriott is a 55 y.o. male who presents to the [facility] for hospital follow-up. He was admitted July 29th for recurrence of abdominal wall cellulitis, and quickly spreading erythema the day of presentation to the ER along with fevers. He was started on Vancomycin and Zosyn. He had a CT scan that showed no involvement of the fistula, no abscess, and blood cultures where negative. Planned for 2 weeks of abx course and was covered with both bactrim and keflex to cover to staph and strep. He had a previous cellulitis in June. He does have a hx of CLL which apparently predisposes him to infection, with baseline WBC 25 - 30k. He was started on probiotic to lessen his chances of getting C diff.  Pt states he's okay, but he's working "like a trojan" everyday. Pt takes OTC probiotic after he eats BID. Pt mentions the redness on his abdomin is improving and he denies pain from his cellulitis. Pt is interested in swimming again and mentions concerns about his hernia scar on his abdomen. Pt reports skin irritation on the scar and was considering putting neosporin on it. Pt uses dial soap to wash his skin. Pt denies getting diarrhea from his medication and reports regular bm. Pt denies chills, and fevers.  Pt recently had a colonoscopy done with nl results and was told to come back in 10 years.  Patient Active Problem List   Diagnosis Date Noted  . Cellulitis 11/28/2015  . Cellulitis of trunk 11/27/2015  . Nonspecific abnormal finding in stool contents   .  Colon cancer screening   . Venous stasis   . History of DVT (deep vein thrombosis)   . Sepsis ([place]) 10/09/2015  . Panniculitis 10/09/2015  . CLL (chronic lymphocytic leukemia) ([place]) 05/27/2015  . 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 Leukocytosis (leucocytosis) 08/29/2014  . HNP (herniated nucleus pulposus), cervical 01/21/2013  . bilat hip replacement 04/08/2012  . Morbid obesity ([place]) 04/08/2012  . Edema 04/08/2012   Past Medical History:  Diagnosis Date  . Anemia   . Arthritis   . BMI 50.0-59.9, adult ([place])   . Cellulitis 11/2015   trunk  . cll 11/2014   CLL - 11/30/2014- Dr. [facility] [place], [place]  . DVT (deep venous thrombosis) ([place]) 6/16   Rt calf  took xarelto for 6 months  . Family history of anesthesia complication    " father having delirium after anesthesia"  . Heart murmur    Hx; of as a child  . History of kidney stones    x2  . Joint pain    Hx: of periodically  . Numbness and tingling    Hx: of in right arm  . Pneumonia   . Sleep apnea    Cpap use- settings 10   Past Surgical History:  Procedure Laterality Date  . COLONOSCOPY     Hx: of  . COLONOSCOPY N/A 11/23/2015   Procedure: COLONOSCOPY;  Surgeon: Irene Shipper, MD;  Location: [facility];  Service:  Endoscopy;  Laterality: N/A;  . INGUINAL HERNIA REPAIR Left 06/01/2015   Procedure: LEFT INGUINAL HERNIA REPAIR WITH MESH;  Surgeon: Autumn Messing III, MD;  Location: [facility] ORS;  Service: General;  Laterality: Left;  . INSERTION OF MESH Left 06/01/2015   Procedure: INSERTION OF MESH;  Surgeon: Autumn Messing III, MD;  Location: [facility] ORS;  Service: General;  Laterality: Left;  . JOINT REPLACEMENT Bilateral    BTHA  . KNEE SURGERY Left 07/21/2014   Dr. Noemi Chapel -scope  . LUMBAR LAMINECTOMY     Hx: of 2005  . POSTERIOR CERVICAL LAMINECTOMY WITH MET- RX Right 01/21/2013   Procedure: Right Sitting C6-7 Microdiskectomy with Met-rex;  Surgeon: Kristeen Miss, MD;  Location: [facility];  Service: Neurosurgery;   Laterality: Right;  Right Sitting C6-7 Microdiskectomy with Met-rex  . SPINE SURGERY    . STENT PLACEMENT RT URETER ([place] HX)  10/23/2014  . tenosynovitis     Hx: of thumb  . TONSILLECTOMY    . TOTAL HIP ARTHROPLASTY     Hx: of B/L hips  . WISDOM TOOTH EXTRACTION  1974   all 4   Allergies  Allergen Reactions  . Adhesive [Tape] Other (See Comments)    Blisters and rash at site   Prior to Admission medications   Medication Sig Start Date End Date Taking? Authorizing Provider  aspirin EC 81 MG tablet Take 81 mg by mouth daily.    Historical Provider, MD  cephALEXin (KEFLEX) 500 MG capsule Take 1 capsule (500 mg total) by mouth 4 (four) times daily. 11/29/15   Janece Canterbury, MD  ferrous sulfate (IRON SUPPLEMENT) 325 (65 FE) MG tablet Take 325 mg by mouth daily with breakfast.    Historical Provider, MD  furosemide (LASIX) 40 MG tablet Take 1 tablet (40 mg total) by mouth daily. 11/11/15   [name], MD  ibuprofen (ADVIL,MOTRIN) 200 MG tablet Take 200 mg by mouth every 6 (six) hours as needed for moderate pain.    Historical Provider, MD  Multiple Vitamins-Minerals (MULTIVITAMIN WITH MINERALS) tablet Take 1 tablet by mouth daily.    Historical Provider, MD  polyethylene glycol (MIRALAX / GLYCOLAX) packet Take 17 g by mouth daily. 10/14/15   Hillary Corinda Gubler, MD  potassium chloride (KLOR-CON 10) 10 MEQ tablet TAKE 1 TABLET (10 MEQ TOTAL) BY MOUTH DAILY. 11/11/15   [name], MD  saccharomyces boulardii (FLORASTOR) 250 MG capsule Take 1 capsule (250 mg total) by mouth 2 (two) times daily. 11/29/15   Janece Canterbury, MD  sulfamethoxazole-trimethoprim (BACTRIM DS,SEPTRA DS) 800-160 MG tablet Take 2 tablets by mouth 2 (two) times daily. 11/29/15   Janece Canterbury, MD   Social History   Social History  . Marital status: Divorced    Spouse name: N/A  . Number of children: N/A  . Years of education: N/A   Occupational History  . 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 Smoking status: Never Smoker  . Smokeless tobacco: Former Systems developer    Types: Chew    Quit date: 05/01/1993  . Alcohol use 0.6 oz/week    1 Standard drinks or equivalent per week     Comment: [employer]  . Drug use: No  . Sexual activity: No   Other Topics Concern  . Not on file   Social History Narrative   Exercise swimming 3-5 times/week for 45 minutes   Depression screen [facility] 2/9 12/16/2015 11/11/2015 11/11/2015 06/17/2015 02/15/2015  Decreased  Interest 0 0 0 0 0  Down, Depressed, Hopeless 0 0 0 0 0  PHQ - 2 Score 0 0 0 0 0   Review of Systems  Constitutional: Negative for chills and fever.  Skin: Positive for color change (improving) and wound (hernia repair).   Objective:  Physical Exam  Constitutional: He appears well-developed and well-nourished. No distress.  HENT:  Head: Normocephalic and atraumatic.  Eyes: Conjunctivae are normal.  Neck: Neck supple.  Cardiovascular: Normal rate.   Pulmonary/Chest: Effort normal.  Neurological: He is alert.  Skin: Skin is warm and dry.  Slight erythema on the abdominal  Remaining wound from the left abdominal wall from previous hernia repair overall of healing but the center is 1-1.5 cm has a slight wet appearance at the base without active discharge or bleeding, non tender Right abdominal wall: slight erythema with slight scale, and slight induration  Psychiatric: He has a normal mood and affect. His behavior is normal.  Nursing note and vitals reviewed.  BP (!) 142/88   Pulse 69   Temp 97.9 F (36.6 C) (Oral)   Resp 20   Ht _0  (1.753 m)   Wt (!) 395 lb 6.4 oz (179.4 kg)   SpO2 94%   BMI 58.39 kg/m  Assessment & Plan:  Juniel Groene is a 55 y.o. male Cellulitis, abdominal wall  CLL (chronic lymphocytic leukemia) (HCC)  Abdominal pannus  History of CLL, obesity, with recurrent abdominal wall cellulitis. Improving from most recent hospitalization. Skin care discussed, with  hydrating soap and water cleansing 1-2 times per day. Advised if any increase in erythema or worsening symptoms, return here or ER right away. Also discussed infectious disease consult, but will defer unless a third recurrence. RTC/ER precautions.   No orders of the defined types were placed in this encounter.  Patient Instructions    Hulan Fray or [place] for men to help lessen drying of skin of abdomen. If any recurrence of cellulitis, I would consider having infectious disease advise [place] on other management options. Follow up with me as planned in October. Sooner if worse.    IF you received an x-ray today, you will receive an invoice from [facility]. Please contact [facility] at [phone] with questions or concerns regarding your invoice.   IF you received labwork today, you will receive an invoice from [employer]. Please contact [facility] at [phone] with questions or concerns regarding your invoice.   Our billing staff will not be able to assist you with questions regarding bills from these companies.  You will be contacted with the lab results as soon as they are available. The fastest way to get your results is to activate your My Chart account. Instructions are located on the last page of this paperwork. If you have not heard from [place] regarding the results in 2 weeks, please contact this office.        I personally performed the services described in this documentation, which was scribed in my presence. The recorded information has been reviewed and considered, and addended by me as needed.   Signed,   [name], MD [facility].  12/17/15 2:07 PM

## 2016-01-10 DIAGNOSIS — R82994 Hypercalciuria: Secondary | ICD-10-CM | POA: Insufficient documentation

## 2016-01-31 DIAGNOSIS — J3800 Paralysis of vocal cords and larynx, unspecified: Secondary | ICD-10-CM | POA: Insufficient documentation

## 2016-02-01 ENCOUNTER — Other Ambulatory Visit: Payer: Self-pay | Admitting: Otolaryngology

## 2016-02-01 DIAGNOSIS — J3800 Paralysis of vocal cords and larynx, unspecified: Secondary | ICD-10-CM

## 2016-02-21 ENCOUNTER — Encounter: Payer: BC Managed Care – PPO | Admitting: Family Medicine

## 2016-02-22 ENCOUNTER — Ambulatory Visit
Admission: RE | Admit: 2016-02-22 | Discharge: 2016-02-22 | Disposition: A | Discharge status: Home / Self Care | Payer: BC Managed Care – PPO | Source: Ambulatory Visit | Attending: Otolaryngology | Admitting: Otolaryngology

## 2016-02-22 DIAGNOSIS — J3800 Paralysis of vocal cords and larynx, unspecified: Secondary | ICD-10-CM

## 2016-02-22 MED ORDER — IOPAMIDOL (ISOVUE-300) INJECTION 61%
75.0000 mL | Freq: Once | INTRAVENOUS | Status: AC | PRN
  Administered 2016-02-22: 75 mL via INTRAVENOUS

## 2016-02-23 ENCOUNTER — Encounter: Payer: BC Managed Care – PPO | Admitting: Family Medicine

## 2016-02-24 ENCOUNTER — Encounter: Payer: Self-pay | Admitting: Family Medicine

## 2016-02-24 ENCOUNTER — Ambulatory Visit (INDEPENDENT_AMBULATORY_CARE_PROVIDER_SITE_OTHER): Payer: BC Managed Care – PPO | Admitting: Family Medicine

## 2016-02-24 VITALS — BP 122/90 | HR 76 | Temp 97.6°F | Resp 24 | Ht 69.0 in | Wt >= 6400 oz

## 2016-02-24 DIAGNOSIS — R609 Edema, unspecified: Secondary | ICD-10-CM

## 2016-02-24 DIAGNOSIS — Z8042 Family history of malignant neoplasm of prostate: Secondary | ICD-10-CM | POA: Diagnosis not present

## 2016-02-24 DIAGNOSIS — R600 Localized edema: Secondary | ICD-10-CM | POA: Diagnosis not present

## 2016-02-24 DIAGNOSIS — C9110 Chronic lymphocytic leukemia of B-cell type not having achieved remission: Secondary | ICD-10-CM

## 2016-02-24 DIAGNOSIS — Z125 Encounter for screening for malignant neoplasm of prostate: Secondary | ICD-10-CM

## 2016-02-24 DIAGNOSIS — Z13220 Encounter for screening for lipoid disorders: Secondary | ICD-10-CM

## 2016-02-24 DIAGNOSIS — E119 Type 2 diabetes mellitus without complications: Secondary | ICD-10-CM

## 2016-02-24 DIAGNOSIS — J3800 Paralysis of vocal cords and larynx, unspecified: Secondary | ICD-10-CM | POA: Diagnosis not present

## 2016-02-24 DIAGNOSIS — Z0000 Encounter for general adult medical examination without abnormal findings: Secondary | ICD-10-CM | POA: Diagnosis not present

## 2016-02-24 LAB — COMPLETE METABOLIC PANEL WITH GFR
ALT: 21 U/L (ref 9–46)
AST: 17 U/L (ref 10–35)
Albumin: 4 g/dL (ref 3.6–5.1)
Alkaline Phosphatase: 106 U/L (ref 40–115)
BUN: 14 mg/dL (ref 7–25)
CALCIUM: 8.7 mg/dL (ref 8.6–10.3)
CHLORIDE: 104 mmol/L (ref 98–110)
CO2: 26 mmol/L (ref 20–31)
Creat: 0.71 mg/dL (ref 0.70–1.33)
GFR, Est African American: >89 mL/min (ref >=60)
Glucose, Bld: 99 mg/dL (ref 65–99)
POTASSIUM: 4.4 mmol/L (ref 3.5–5.3)
SODIUM: 139 mmol/L (ref 135–146)
Total Bilirubin: 0.5 mg/dL (ref 0.2–1.2)
Total Protein: 6.7 g/dL (ref 6.1–8.1)

## 2016-02-24 LAB — LIPID PANEL
CHOL/HDL RATIO: 3.8 ratio (ref <=5.0)
CHOLESTEROL: 180 mg/dL (ref 125–200)
HDL: 47 mg/dL (ref >=40)
LDL Cholesterol: 106 mg/dL (ref <130)
Triglycerides: 136 mg/dL (ref <150)
VLDL: 27 mg/dL (ref <30)

## 2016-02-24 LAB — HEMOGLOBIN A1C
HEMOGLOBIN A1C: 6.2 % — AB (ref <5.7)
Mean Plasma Glucose: 131 mg/dL

## 2016-02-24 LAB — PSA: PSA: 0.3 ng/mL (ref <=4.0)

## 2016-02-24 MED ORDER — POTASSIUM CHLORIDE ER 10 MEQ PO TBCR: EXTENDED_RELEASE_TABLET | ORAL | 1 refills | Status: DC

## 2016-02-24 MED ORDER — FUROSEMIDE 40 MG PO TABS
60.0000 mg | ORAL_TABLET | Freq: Every day | ORAL | 1 refills | Status: DC
Start: 2016-02-24 — End: 2016-08-13

## 2016-02-24 NOTE — Patient Instructions (Addendum)
You can try increasing Lasix to 1-1/2 pills on alternating days, and taken additional half pill of your potassium at that time. If the swelling is not significantly improved in the next week or 2 with this regimen, or any worsening of symptoms, return to recheck. Otherwise follow-up in the next 4-6 weeks to recheck swelling and follow-up on some of the issues discussed today.  Due to difficulty with exercise, make sure you're watching diet and portion sizes to help with weight management. I will check the diabetes test again and let you know if it is in a level where medication may be needed.  Return to the clinic or go to the nearest emergency room if any of your symptoms worsen or new symptoms occur.  Keeping you healthy  Get these tests  Blood pressure- Have your blood pressure checked once a year by your healthcare provider.  Normal blood pressure is 120/80  Weight- Have your body mass index (BMI) calculated to screen for obesity.  BMI is a measure of body fat based on height and weight. You can also calculate your own BMI at ViewBanking.si.  Cholesterol- Have your cholesterol checked every year.  Diabetes- Have your blood sugar checked regularly if you have high blood pressure, high cholesterol, have a family history of diabetes or if you are overweight.  Screening for Colon Cancer- Colonoscopy starting at age 48.  Screening may begin sooner depending on your family history and other health conditions. Follow up colonoscopy as directed by your Gastroenterologist.  Screening for Prostate Cancer- Both blood work (PSA) and a rectal exam help screen for Prostate Cancer.  Screening begins at age 52 with African-American men and at age 58 with Caucasian men.  Screening may begin sooner depending on your family history.  Take these medicines  Aspirin- One aspirin daily can help prevent Heart disease and Stroke.  Flu shot- Every fall.  Tetanus- Every 10 years.  Zostavax- Once  after the age of 6 to prevent Shingles.  Pneumonia shot- Once after the age of 60; if you are younger than 39, ask your healthcare provider if you need a Pneumonia shot.  Take these steps  Don't smoke- If you do smoke, talk to your doctor about quitting.  For tips on how to quit, go to www.smokefree.gov or call 1-800-QUIT-NOW.  Be physically active- Exercise 5 days a week for at least 30 minutes.  If you are not already physically active start slow and gradually work up to 30 minutes of moderate physical activity.  Examples of moderate activity include walking briskly, mowing the yard, dancing, swimming, bicycling, etc.  Eat a healthy diet- Eat a variety of healthy food such as fruits, vegetables, low fat milk, low fat cheese, yogurt, lean meant, poultry, fish, beans, tofu, etc. For more information go to www.thenutritionsource.org  Drink alcohol in moderation- Limit alcohol intake to less than two drinks a day. Never drink and drive.  Dentist- Brush and floss twice daily; visit your dentist twice a year.  Depression- Your emotional health is as important as your physical health. If you're feeling down, or losing interest in things you would normally enjoy please talk to your healthcare provider.  Eye exam- Visit your eye doctor every year.  Safe sex- If you may be exposed to a sexually transmitted infection, use a condom.  Seat belts- Seat belts can save your life; always wear one.  Smoke/Carbon Monoxide detectors- These detectors need to be installed on the appropriate level of your home.  Replace  batteries at least once a year.  Skin cancer- When out in the sun, cover up and use sunscreen 15 SPF or higher.  Violence- If anyone is threatening you, please tell your healthcare provider.  Living Will/ Health care power of attorney- Speak with your healthcare provider and family.   IF you received an x-ray today, you will receive an invoice from [facility]. Please contact  [facility]iology at [phone] with questions or concerns regarding your invoice.   IF you received labwork today, you will receive an invoice from [employer]. Please contact [facility] at [phone] with questions or concerns regarding your invoice.   Our billing staff will not be able to assist you with questions regarding bills from these companies.  You will be contacted with the lab results as soon as they are available. The fastest way to get your results is to activate your My Chart account. Instructions are located on the last page of this paperwork. If you have not heard from [place] regarding the results in 2 weeks, please contact this office.

## 2016-02-24 NOTE — Progress Notes (Signed)
Subjective:    Patient ID: Henry Hinton, male    DOB: October 14, 1960, 55 y.o.   MRN: [id number]  HPI Henry Hinton is a 55 y.o. male Here for complete physical. He has a history of multiple medical consequent obesity, recurrent cellulitis requiring hospital admission, history of DVT, chronic lymphocytic leukemia and chronic lower extremity edema. Oncologist is Dr. [name] in [facility]. Cancer screening: Hx of CLL, followed by [facility] in [facility]. Last visit last month.  Blood draw stable. No new meds or treatments. Monitoring every 3-4 months.   Colonoscopy: 11/23/15, normal, Dr. [name], repeat in 10 years.  Prostate cancer screening/last PSA: father with prostate CA in 3's.  Agrees to testing today.  Lab Results  Component Value Date   PSA 0.38 02/15/2015   PSA 0.39 11/25/2013   PSA 0.39 05/05/2013    Immunization History  Administered Date(s) Administered  . Tdap 07/31/2006  flu vaccine: refuses to have flu shot. Denies any questions. Had double conjunctivitis and thinks it was caused by flu vaccine.  Depression screening: Depression screen [facility] 2/9 02/24/2016 12/16/2015 11/11/2015 11/11/2015 06/17/2015  Decreased Interest 0 0 0 0 0  Down, Depressed, Hopeless 0 0 0 0 0  PHQ - 2 Score 0 0 0 0 0   Vision:saw optho late July.  Watching out for shadows as "freckle on Macula" no change in vision recently.   Visual Acuity Screening   Right eye Left eye Both eyes  Without correction: _0  With correction:      Dentist: 2 times per year.   Lipid screening:  Lab Results  Component Value Date   CHOL 188 11/11/2015   HDL 56 11/11/2015   LDLCALC 109 11/11/2015   TRIG 116 11/11/2015   CHOLHDL 3.4 11/11/2015   Exercise: decreased due to work schedule and pain in groin. Fast food once per week.   DM2Hyperglycemia: Hyperglycemia, diabetic range based on A1c 3 months ago. Have discussed exercise and diet in the past for obesity. Fast food --once per week.  Less  than 1 per day of alcohol - less recently.    Wt Readings from Last 3 Encounters:  02/24/16 (!) 403 lb 12.8 oz (183.2 kg)  12/16/15 (!) 395 lb 6.4 oz (179.4 kg)  11/27/15 (!) 391 lb 15.7 oz (177.8 kg)    Lab Results  Component Value Date   HGBA1C 6.6 (H) 11/11/2015   Chronic lower extremity edema, thought to be part due to obesity. He takes Lasix 40 mg daily. Potassium 10 mEq once per day. Has noticed more firmness over the past month. No change in color or redness. No drainage.  Has not met with vascular specialist in past. No chest pain, no dyspnea. Weight has increased due to being busy with work and with hernia on left. No recent calf pains or significant difference in one side vs other.   L hernia repair - has had some persistent groin swelling and occasional discomfort. Told by surgeon it was due to scar tissue. Last CT of abdomen in hospital in July. Occasional soreness. Has not met with surgeon since June.   He does take aspirin 81 mg daily.  Anemia - on iron once per day.   Followed by ENT Janace Hoard) - left vocal cord palsy/paralysis. May have been due to viral infection. Will watch for 6-9 months to see if will improve. Had CT on the 24th - no explanation for left vocal cord paralysis.   Followed by urologist once  per year for kidney stones. No recent issues.  Patient Active Problem List   Diagnosis Date Noted  . Cellulitis 11/28/2015  . Cellulitis of trunk 11/27/2015  . Nonspecific abnormal finding in stool contents   . Colon cancer screening   . Venous stasis   . History of DVT (deep vein thrombosis)   . Sepsis ([place]) 10/09/2015  . Panniculitis 10/09/2015  . CLL (chronic lymphocytic leukemia) ([place]) 05/27/2015  . 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 Leukocytosis (leucocytosis) 08/29/2014  . HNP (herniated nucleus pulposus), cervical 01/21/2013  . bilat hip replacement 04/08/2012  . Morbid obesity ([place]) 04/08/2012  . Edema 04/08/2012   Past Medical History:  Diagnosis Date    . Anemia   . Arthritis   . BMI 50.0-59.9, adult ([place])   . Cellulitis 11/2015   trunk  . cll 11/2014   CLL - 11/30/2014- Dr. [facility] [place], [place]  . DVT (deep venous thrombosis) ([place]) 6/16   Rt calf  took xarelto for 6 months  . Family history of anesthesia complication    " father having delirium after anesthesia"  . Heart murmur    Hx; of as a child  . History of kidney stones    x2  . Joint pain    Hx: of periodically  . Numbness and tingling    Hx: of in right arm  . Pneumonia   . Sleep apnea    Cpap use- settings 10   Past Surgical History:  Procedure Laterality Date  . COLONOSCOPY     Hx: of  . COLONOSCOPY N/A 11/23/2015   Procedure: COLONOSCOPY;  Surgeon: Irene Shipper, MD;  Location: [facility];  Service: Endoscopy;  Laterality: N/A;  . INGUINAL HERNIA REPAIR Left 06/01/2015   Procedure: LEFT INGUINAL HERNIA REPAIR WITH MESH;  Surgeon: Autumn Messing III, MD;  Location: [facility] ORS;  Service: General;  Laterality: Left;  . INSERTION OF MESH Left 06/01/2015   Procedure: INSERTION OF MESH;  Surgeon: Autumn Messing III, MD;  Location: [facility] ORS;  Service: General;  Laterality: Left;  . JOINT REPLACEMENT Bilateral    BTHA  . KNEE SURGERY Left 07/21/2014   Dr. Noemi Chapel -scope  . LUMBAR LAMINECTOMY     Hx: of 2005  . POSTERIOR CERVICAL LAMINECTOMY WITH MET- RX Right 01/21/2013   Procedure: Right Sitting C6-7 Microdiskectomy with Met-rex;  Surgeon: Kristeen Miss, MD;  Location: [facility];  Service: Neurosurgery;  Laterality: Right;  Right Sitting C6-7 Microdiskectomy with Met-rex  . SPINE SURGERY    . STENT PLACEMENT RT URETER ([place] [facility])  10/23/2014  . tenosynovitis     Hx: of thumb  . TONSILLECTOMY    . TOTAL HIP ARTHROPLASTY     Hx: of B/L hips  . WISDOM TOOTH EXTRACTION  1974   all 4   Allergies  Allergen Reactions  . Adhesive [Tape] Other (See Comments)    Blisters and rash at site   Prior to Admission medications   Medication Sig Start Date End Date Taking?  Authorizing Provider  aspirin EC 81 MG tablet Take 81 mg by mouth daily.   Yes Historical Provider, MD  ferrous sulfate (IRON SUPPLEMENT) 325 (65 FE) MG tablet Take 325 mg by mouth daily with breakfast.   Yes Historical Provider, MD  furosemide (LASIX) 40 MG tablet Take 1 tablet (40 mg total) by mouth daily. 11/11/15  Yes [name], MD  ibuprofen (ADVIL,MOTRIN) 200 MG tablet Take 200 mg by mouth every 6 (six) hours as  needed for moderate pain.   Yes Historical Provider, MD  Multiple Vitamins-Minerals (MULTIVITAMIN WITH MINERALS) tablet Take 1 tablet by mouth daily.   Yes Historical Provider, MD  polyethylene glycol (MIRALAX / GLYCOLAX) packet Take 17 g by mouth daily. 10/14/15  Yes Hillary Corinda Gubler, MD  potassium chloride (KLOR-CON 10) 10 MEQ tablet TAKE 1 TABLET (10 MEQ TOTAL) BY MOUTH DAILY. 11/11/15  Yes [name], MD  saccharomyces boulardii (FLORASTOR) 250 MG capsule Take 1 capsule (250 mg total) by mouth 2 (two) times daily. 11/29/15  Yes Janece Canterbury, MD   Social History   Social History  . Marital status: Divorced    Spouse name: N/A  . Number of children: N/A  . Years of education: N/A   Occupational History  . 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 Smoking status: Never Smoker  . Smokeless tobacco: Former Systems developer    Types: Chew    Quit date: 05/01/1993  . Alcohol use 0.6 oz/week    1 Standard drinks or equivalent per week     Comment: [employer]  . Drug use: No  . Sexual activity: No   Other Topics Concern  . Not on file   Social History Narrative   Exercise swimming 3-5 times/week for 45 minutes      Review of Systems As above in history of present illness, 13 point ROS reviewed for physical paperwork. Negative other than listed above.    Objective:   Physical Exam  Constitutional: He is oriented to person, place, and time. He appears well-developed and well-nourished.  HENT:  Head: Normocephalic  and atraumatic.  Right Ear: External ear normal.  Left Ear: External ear normal.  Mouth/Throat: Oropharynx is clear and moist.  Eyes: Conjunctivae and EOM are normal. Pupils are equal, round, and reactive to light.  Neck: Normal range of motion. Neck supple. No thyromegaly present.  Cardiovascular: Normal rate, regular rhythm, normal heart sounds and intact distal pulses.   Pulmonary/Chest: Effort normal and breath sounds normal. No respiratory distress. He has no wheezes.  Abdominal: Soft. He exhibits no distension. There is no tenderness. Hernia confirmed negative in the right inguinal area and confirmed negative in the left inguinal area.  Genitourinary: Prostate normal.     Musculoskeletal: Normal range of motion. He exhibits no edema or tenderness.  Lymphadenopathy:    He has no cervical adenopathy.  Neurological: He is alert and oriented to person, place, and time. He has normal reflexes.  Skin: Skin is warm and dry. No rash noted.     Psychiatric: He has a normal mood and affect. His behavior is normal.  Vitals reviewed.     Assessment & Plan:   Aimee Timmons is a 55 y.o. male Annual physical exam  --anticipatory guidance as below in AVS, screening labs above. Health maintenance items as above in HPI discussed/recommended as applicable.   Diabetes mellitus type 2, diet-controlled ([place]) - Plan: COMPLETE METABOLIC PANEL WITH GFR, Microalbumin, urine, Hemoglobin A1c  -Check A1c, microalbumin. Unfortunately weight has increased. May need medication depending on A1c results.  Screening for hyperlipidemia - Plan: Lipid panel  CLL (chronic lymphocytic leukemia) (H[facility])  -Stable by report. Continue follow-up as scheduled with oncologist/hematologist.  Continued on iron for anemia. Levels followed by oncologist/hematologist  Lower leg edema - Plan: furosemide (LASIX) 40 MG tablet, potassium chloride (KLOR-CON 10) 10 MEQ tablet Peripheral edema - Plan: furosemide (LASIX) 40 MG  tablet  -Some increased edema  likely due to increased weight. Change lasix to 60 mg on alternating days with 40 mg, then additional half dose of his potassium supplement on those days of higher dosing. Follow-up to discuss this in the next 6-8 weeks further. Consider other evaluation if not improving with this plan. Sooner if worse  Family history of prostate cancer - Plan: PSA Screening for prostate cancer - Plan: PSA  -We discussed pros and cons of prostate cancer screening, and after this discussion, he chose to have screening done. PSA obtained, and no concerning findings on DRE.   Vocal cord paralysis  -As above followed by ENT, possible viral infection by his report. No concerning findings on recent CT. Continue follow-up as planned with ENT.  Meds ordered this encounter  Medications  . furosemide (LASIX) 40 MG tablet    Sig: Take 1.5 tablets (60 mg total) by mouth daily. On alternating days with 34m.    Dispense:  135 tablet    Refill:  1  . potassium chloride (KLOR-CON 10) 10 MEQ tablet    Sig: TAKE 1 TABLET (10 MEQ TOTAL) BY MOUTH DAILY.    Dispense:  135 tablet    Refill:  1   Patient Instructions   You can try increasing Lasix to 1-1/2 pills on alternating days, and taken additional half pill of your potassium at that time. If the swelling is not significantly improved in the next week or 2 with this regimen, or any worsening of symptoms, return to recheck. Otherwise follow-up in the next 4-6 weeks to recheck swelling and follow-up on some of the issues discussed today.  Due to difficulty with exercise, make sure you're watching diet and portion sizes to help with weight management. I will check the diabetes test again and let you know if it is in a level where medication may be needed.  Return to the clinic or go to the nearest emergency room if any of your symptoms worsen or new symptoms occur.  Keeping you healthy  Get these tests  Blood pressure- Have your blood  pressure checked once a year by your healthcare provider.  Normal blood pressure is 120/80  Weight- Have your body mass index (BMI) calculated to screen for obesity.  BMI is a measure of body fat based on height and weight. You can also calculate your own BMI at wViewBanking.si  Cholesterol- Have your cholesterol checked every year.  Diabetes- Have your blood sugar checked regularly if you have high blood pressure, high cholesterol, have a family history of diabetes or if you are overweight.  Screening for Colon Cancer- Colonoscopy starting at age 55  Screening may begin sooner depending on your family history and other health conditions. Follow up colonoscopy as directed by your Gastroenterologist.  Screening for Prostate Cancer- Both blood work (PSA) and a rectal exam help screen for Prostate Cancer.  Screening begins at age 3154with African-American men and at age 7944with Caucasian men.  Screening may begin sooner depending on your family history.  Take these medicines  Aspirin- One aspirin daily can help prevent Heart disease and Stroke.  Flu shot- Every fall.  Tetanus- Every 10 years.  Zostavax- Once after the age of 627to prevent Shingles.  Pneumonia shot- Once after the age of 666 if you are younger than 692 ask your healthcare provider if you need a Pneumonia shot.  Take these steps  Don't smoke- If you do smoke, talk to your doctor about quitting.  For tips on how to  quit, go to www.smokefree.gov or call 1-800-QUIT-NOW.  Be physically active- Exercise 5 days a week for at least 30 minutes.  If you are not already physically active start slow and gradually work up to 30 minutes of moderate physical activity.  Examples of moderate activity include walking briskly, mowing the yard, dancing, swimming, bicycling, etc.  Eat a healthy diet- Eat a variety of healthy food such as fruits, vegetables, low fat milk, low fat cheese, yogurt, lean meant, poultry, fish, beans, tofu,  etc. For more information go to www.thenutritionsource.org  Drink alcohol in moderation- Limit alcohol intake to less than two drinks a day. Never drink and drive.  Dentist- Brush and floss twice daily; visit your dentist twice a year.  Depression- Your emotional health is as important as your physical health. If you're feeling down, or losing interest in things you would normally enjoy please talk to your healthcare provider.  Eye exam- Visit your eye doctor every year.  Safe sex- If you may be exposed to a sexually transmitted infection, use a condom.  Seat belts- Seat belts can save your life; always wear one.  Smoke/Carbon Monoxide detectors- These detectors need to be installed on the appropriate level of your home.  Replace batteries at least once a year.  Skin cancer- When out in the sun, cover up and use sunscreen 15 SPF or higher.  Violence- If anyone is threatening you, please tell your healthcare provider.  Living Will/ Health care power of attorney- Speak with your healthcare provider and family.   IF you received an x-ray today, you will receive an invoice from [facility]. Please contact [facility] Radiology at [phone] with questions or concerns regarding your invoice.   IF you received labwork today, you will receive an invoice from [employer]. Please contact [facility] at [phone] with questions or concerns regarding your invoice.   Our billing staff will not be able to assist you with questions regarding bills from these companies.  You will be contacted with the lab results as soon as they are available. The fastest way to get your results is to activate your My Chart account. Instructions are located on the last page of this paperwork. If you have not heard from [place] regarding the results in 2 weeks, please contact this office.        Signed,   [name], MD [facility].  02/25/16 5:06 PM

## 2016-02-25 LAB — MICROALBUMIN, URINE: MICROALB UR: 0.7 mg/dL

## 2016-03-17 ENCOUNTER — Ambulatory Visit (INDEPENDENT_AMBULATORY_CARE_PROVIDER_SITE_OTHER): Payer: BC Managed Care – PPO

## 2016-03-17 ENCOUNTER — Ambulatory Visit (INDEPENDENT_AMBULATORY_CARE_PROVIDER_SITE_OTHER): Payer: BC Managed Care – PPO | Admitting: Family Medicine

## 2016-03-17 VITALS — BP 132/80 | HR 100 | Temp 98.4°F | Resp 16 | Ht 69.0 in | Wt >= 6400 oz

## 2016-03-17 DIAGNOSIS — C9110 Chronic lymphocytic leukemia of B-cell type not having achieved remission: Secondary | ICD-10-CM | POA: Diagnosis not present

## 2016-03-17 DIAGNOSIS — J069 Acute upper respiratory infection, unspecified: Secondary | ICD-10-CM

## 2016-03-17 DIAGNOSIS — R059 Cough, unspecified: Secondary | ICD-10-CM

## 2016-03-17 DIAGNOSIS — R05 Cough: Secondary | ICD-10-CM | POA: Diagnosis not present

## 2016-03-17 LAB — POCT CBC
Granulocyte percent: 20.5 %G — AB (ref 37–80)
HEMATOCRIT: 41.1 % — AB (ref 43.5–53.7)
HEMOGLOBIN: 13.8 g/dL — AB (ref 14.1–18.1)
LYMPH, POC: 34.2 — AB (ref 0.6–3.4)
MCH, POC: 27.9 pg (ref 27–31.2)
MCHC: 33.6 g/dL (ref 31.8–35.4)
MCV: 83.1 fL (ref 80–97)
MID (cbc): 2 — AB (ref 0–0.9)
MPV: 7.8 fL (ref 0–99.8)
POC GRANULOCYTE: 9.3 — AB (ref 2–6.9)
POC LYMPH %: 75.1 % — AB (ref 10–50)
POC MID %: 4.4 %M (ref 0–12)
Platelet Count, POC: 186 10*3/uL (ref 142–424)
RBC: 4.95 M/uL (ref 4.69–6.13)
RDW, POC: 15.8 %
WBC: 45.6 10*3/uL — AB (ref 4.6–10.2)

## 2016-03-17 LAB — GLUCOSE, POCT (MANUAL RESULT ENTRY): POC GLUCOSE: 135 mg/dL — AB (ref 70–99)

## 2016-03-17 LAB — POCT INFLUENZA A/B
INFLUENZA B, POC: NEGATIVE
Influenza A, POC: NEGATIVE

## 2016-03-17 MED ORDER — MUCINEX DM 30-600 MG PO TB12: 1.0000 | ORAL_TABLET | Freq: Two times a day (BID) | ORAL | 0 refills | Status: DC

## 2016-03-17 MED ORDER — AMOXICILLIN-POT CLAVULANATE 875-125 MG PO TABS: 1.0000 | ORAL_TABLET | Freq: Two times a day (BID) | ORAL | 0 refills | Status: DC

## 2016-03-17 MED ORDER — CEFTRIAXONE SODIUM 1 G IJ SOLR
1.0000 g | Freq: Once | INTRAMUSCULAR | Status: AC
  Administered 2016-03-17: 1 g via INTRAMUSCULAR

## 2016-03-17 NOTE — Progress Notes (Signed)
Subjective:    Patient ID: Henry Hinton, male    DOB: [birth year], 55 y.o.   MRN: [id number]  03/17/2016  Cough (productive, brown mucous); Nasal Congestion (x 3 days); and Headache   HPI This 55 y.o. male presents for evaluation of cough and congestion.Onset three days ago.  No fever but +chills/sweats.  +nasal congestion; +rhinorrhea; really bad sore throat; chest is also sore B.  Hard time breathing. On CPAP and was really congested in head.  Coughing a lot; brown sputum from throat.  No SOB.  Might have been wheezing this morning.  No vomiting or diarrhea.  +HA.  No body aches. No medications; taking Loratadine.  Teaches.  No history of tobacco.  Drinks liquor.  [place] [employer]; teaches anatomy and physiology.  Also Production assistant, radio.  No flu vaccine.  Has CLL and flu vaccine is debatable. Last flu vaccine 2010.    9/13 WBC 39.8; Hgb 12.8; Hct 40.7; plt 198.   Review of Systems  Constitutional: Positive for chills, diaphoresis and fatigue. Negative for activity change, appetite change and fever.  HENT: Positive for congestion, rhinorrhea and sore throat. Negative for ear pain.   Eyes: Negative for visual disturbance.  Respiratory: Positive for cough and wheezing. Negative for shortness of breath.   Cardiovascular: Negative for chest pain, palpitations and leg swelling.  Gastrointestinal: Negative for diarrhea, nausea and vomiting.  Endocrine: Negative for cold intolerance, heat intolerance, polydipsia, polyphagia and polyuria.  Neurological: Positive for headaches. Negative for dizziness, tremors, seizures, syncope, facial asymmetry, speech difficulty, weakness, light-headedness and numbness.    Past Medical History:  Diagnosis Date  . Anemia   . Arthritis   . BMI 50.0-59.9, adult ([place])   . Cellulitis 11/2015   trunk  . cll 11/2014   CLL - 11/30/2014- Dr. [facility] [place], [place]  . DVT (deep venous thrombosis) ([place]) 6/16   Rt calf  took xarelto for 6  months  . Family history of anesthesia complication    " father having delirium after anesthesia"  . Heart murmur    Hx; of as a child  . History of kidney stones    x2  . Joint pain    Hx: of periodically  . Numbness and tingling    Hx: of in right arm  . Pneumonia   . Sleep apnea    Cpap use- settings 10   Past Surgical History:  Procedure Laterality Date  . COLONOSCOPY     Hx: of  . COLONOSCOPY N/A 11/23/2015   Procedure: COLONOSCOPY;  Surgeon: Irene Shipper, MD;  Location: [facility];  Service: Endoscopy;  Laterality: N/A;  . INGUINAL HERNIA REPAIR Left 06/01/2015   Procedure: LEFT INGUINAL HERNIA REPAIR WITH MESH;  Surgeon: Autumn Messing III, MD;  Location: [facility];  Service: General;  Laterality: Left;  . INSERTION OF MESH Left 06/01/2015   Procedure: INSERTION OF MESH;  Surgeon: Autumn Messing III, MD;  Location: [facility];  Service: General;  Laterality: Left;  . JOINT REPLACEMENT Bilateral    BTHA  . KNEE SURGERY Left 07/21/2014   Dr. Noemi Chapel -scope  . LUMBAR LAMINECTOMY     Hx: of 2005  . POSTERIOR CERVICAL LAMINECTOMY WITH MET- RX Right 01/21/2013   Procedure: Right Sitting C6-7 Microdiskectomy with Met-rex;  Surgeon: Kristeen Miss, MD;  Location: [facility];  Service: Neurosurgery;  Laterality: Right;  Right Sitting C6-7 Microdiskectomy with Met-rex  . SPINE SURGERY    . STENT PLACEMENT RT URETER [facility]  HX)  10/23/2014  . tenosynovitis     Hx: of thumb  . TONSILLECTOMY    . TOTAL HIP ARTHROPLASTY     Hx: of B/L hips  . WISDOM TOOTH EXTRACTION  1974   all 4   Allergies  Allergen Reactions  . Adhesive [Tape] Other (See Comments)    Blisters and rash at site    Social History   Social History  . Marital status: Divorced    Spouse name: N/A  . Number of children: N/A  . Years of education: N/A   Occupational History  . Teacher/Athletic Trainer [employer] History Main Topics  . Smoking status: Never Smoker  . Smokeless tobacco: Former Systems developer     Types: Chew    Quit date: 05/01/1993  . Alcohol use 0.6 oz/week    1 Standard drinks or equivalent per week     Comment: [employer]  . Drug use: No  . Sexual activity: No   Other Topics Concern  . Not on file   Social History Narrative   Exercise swimming 3-5 times/week for 45 minutes   Family History  Problem Relation Age of Onset  . COPD Maternal Grandmother   . Heart disease Maternal Grandfather   . Lung cancer Paternal Grandfather     LUNG  . Diabetes Father   . Prostate cancer Father     PROSTATE       Objective:    BP 132/80   Pulse 100   Temp 98.4 F (36.9 C) (Oral)   Resp 16   Ht '5\' 9"'  (1.753 m)   Wt (!) 403 lb (182.8 kg)   SpO2 95%   BMI 59.51 kg/m  Physical Exam  Constitutional: He is oriented to person, place, and time. He appears well-developed and well-nourished. He appears ill. No distress.  HENT:  Head: Normocephalic and atraumatic.  Right Ear: Tympanic membrane, external ear and ear canal normal.  Left Ear: Tympanic membrane, external ear and ear canal normal.  Nose: Mucosal edema and rhinorrhea present.  Mouth/Throat: Uvula is midline and mucous membranes are normal. No uvula swelling. Posterior oropharyngeal erythema present. No oropharyngeal exudate.  Eyes: Conjunctivae and EOM are normal. Pupils are equal, round, and reactive to light.  Neck: Normal range of motion. Neck supple. Carotid bruit is not present. No thyromegaly present.  Cardiovascular: Normal rate, regular rhythm, normal heart sounds and intact distal pulses.  Exam reveals no gallop and no friction rub.   No murmur heard. Pulmonary/Chest: Effort normal and breath sounds normal. He has no wheezes. He has no rales.  Abdominal: Soft. Bowel sounds are normal. He exhibits no distension and no mass. There is no tenderness. There is no rebound and no guarding.  Lymphadenopathy:    He has no cervical adenopathy.  Neurological: He is alert and oriented to person, place, and time. No  cranial nerve deficit.  Skin: Skin is warm and dry. No rash noted. He is not diaphoretic.  Psychiatric: He has a normal mood and affect. His behavior is normal.  Nursing note and vitals reviewed.  Results for orders placed or performed in visit on 03/17/16  POCT CBC  Result Value Ref Range   WBC 45.6 (A) 4.6 - 10.2 K/uL   Lymph, poc 34.2 (A) 0.6 - 3.4   POC LYMPH PERCENT 75.1 (A) 10 - 50 %L   MID (cbc) 2.0 (A) 0 - 0.9   POC MID % 4.4 0 - 12 %M  POC Granulocyte 9.3 (A) 2 - 6.9   Granulocyte percent 20.5 (A) 37 - 80 %G   RBC 4.95 4.69 - 6.13 M/uL   Hemoglobin 13.8 (A) 14.1 - 18.1 g/dL   HCT, POC 41.1 (A) 43.5 - 53.7 %   MCV 83.1 80 - 97 fL   MCH, POC 27.9 27 - 31.2 pg   MCHC 33.6 31.8 - 35.4 g/dL   RDW, POC 15.8 %   Platelet Count, POC 186 142 - 424 K/uL   MPV 7.8 0 - 99.8 fL  POCT glucose (manual entry)  Result Value Ref Range   POC Glucose 135 (A) 70 - 99 mg/dl  POCT Influenza A/B  Result Value Ref Range   Influenza A, POC Negative Negative   Influenza B, POC Negative Negative   No results found.     Assessment & Plan:   1. Acute upper respiratory infection   2. Cough   3. CLL (chronic lymphocytic leukemia) ([place])   4. Morbid obesity ([place])    -New onset; due to comorbidities, will treat empirically with Augmentin, Mucinex DM. Pt requested Rocephin and agreeable. -followed closely by heme/onc; WBC elevated today but similar to recent levels in the past six months WBC have ranged from 28,000 to 45,000 today; had 40,000 reading on 10/2015.  Recommend close follow-up with heme/onc. -RTC for acute decline.    Orders Placed This Encounter  Procedures  . DG Chest 2 View    Standing Status:   Future    Number of Occurrences:   1    Standing Expiration Date:   03/17/2017    Order Specific Question:   Reason for Exam (SYMPTOM  OR DIAGNOSIS REQUIRED)    Answer:   cough, shortness of breath    Order Specific Question:   Preferred imaging location?    Answer:   External  .  POCT CBC  . POCT glucose (manual entry)  . POCT Influenza A/B   Meds ordered this encounter  Medications  . cefTRIAXone (ROCEPHIN) injection 1 g  . DISCONTD: amoxicillin-clavulanate (AUGMENTIN) 875-125 MG tablet    Sig: Take 1 tablet by mouth 2 (two) times daily.    Dispense:  20 tablet    Refill:  0  . Dextromethorphan-Guaifenesin (MUCINEX DM) 30-600 MG TB12    Sig: Take 1 tablet by mouth 2 (two) times daily.    Dispense:  28 each    Refill:  0  . amoxicillin-clavulanate (AUGMENTIN) 875-125 MG tablet    Sig: Take 1 tablet by mouth 2 (two) times daily.    Dispense:  20 tablet    Refill:  0    No Follow-up on file.   Kyden Potash Elayne Guerin, M.D. [facility] [address] [place], [place]  [address] [phone] phone [phone] fax

## 2016-03-17 NOTE — Patient Instructions (Addendum)
IF you received an x-ray today, you will receive an invoice from [facility]. Please contact [facility] at [phone] with questions or concerns regarding your invoice.   IF you received labwork today, you will receive an invoice from [employer]. Please contact [facility] at [phone] with questions or concerns regarding your invoice.   Our billing staff will not be able to assist you with questions regarding bills from these companies.  You will be contacted with the lab results as soon as they are available. The fastest way to get your results is to activate your My Chart account. Instructions are located on the last page of this paperwork. If you have not heard from [place] regarding the results in 2 weeks, please contact this office.      Upper Respiratory Infection, Adult Most upper respiratory infections (URIs) are a viral infection of the air passages leading to the lungs. A URI affects the nose, throat, and upper air passages. The most common type of URI is nasopharyngitis and is typically referred to as "the common cold." URIs run their course and usually go away on their own. Most of the time, a URI does not require medical attention, but sometimes a bacterial infection in the upper airways can follow a viral infection. This is called a secondary infection. Sinus and middle ear infections are common types of secondary upper respiratory infections. Bacterial pneumonia can also complicate a URI. A URI can worsen asthma and chronic obstructive pulmonary disease (COPD). Sometimes, these complications can require emergency medical care and may be life threatening. What are the causes? Almost all URIs are caused by viruses. A virus is a type of germ and can spread from one person to another. What increases the risk? You may be at risk for a URI if:  You smoke.  You have chronic heart or lung disease.  You have a weakened defense (immune)  system.  You are very young or very old.  You have nasal allergies or asthma.  You work in crowded or poorly ventilated areas.  You work in health care facilities or schools. What are the signs or symptoms? Symptoms typically develop 2-3 days after you come in contact with a cold virus. Most viral URIs last 7-10 days. However, viral URIs from the influenza virus (flu virus) can last 14-18 days and are typically more severe. Symptoms may include:  Runny or stuffy (congested) nose.  Sneezing.  Cough.  Sore throat.  Headache.  Fatigue.  Fever.  Loss of appetite.  Pain in your forehead, behind your eyes, and over your cheekbones (sinus pain).  Muscle aches. How is this diagnosed? Your health care provider may diagnose a URI by:  Physical exam.  Tests to check that your symptoms are not due to another condition such as:  Strep throat.  Sinusitis.  Pneumonia.  Asthma. How is this treated? A URI goes away on its own with time. It cannot be cured with medicines, but medicines may be prescribed or recommended to relieve symptoms. Medicines may help:  Reduce your fever.  Reduce your cough.  Relieve nasal congestion. Follow these instructions at home:  Take medicines only as directed by your health care provider.  Gargle warm saltwater or take cough drops to comfort your throat as directed by your health care provider.  Use a warm mist humidifier or inhale steam from a shower to increase air moisture. This may make it easier to breathe.  Drink enough fluid to keep  your urine clear or pale yellow.  Eat soups and other clear broths and maintain good nutrition.  Rest as needed.  Return to work when your temperature has returned to normal or as your health care provider advises. You may need to stay home longer to avoid infecting others. You can also use a face mask and careful hand washing to prevent spread of the virus.  Increase the usage of your inhaler if  you have asthma.  Do not use any tobacco products, including cigarettes, chewing tobacco, or electronic cigarettes. If you need help quitting, ask your health care provider. How is this prevented? The best way to protect yourself from getting a cold is to practice good hygiene.  Avoid oral or hand contact with people with cold symptoms.  Wash your hands often if contact occurs. There is no clear evidence that vitamin C, vitamin E, echinacea, or exercise reduces the chance of developing a cold. However, it is always recommended to get plenty of rest, exercise, and practice good nutrition. Contact a health care provider if:  You are getting worse rather than better.  Your symptoms are not controlled by medicine.  You have chills.  You have worsening shortness of breath.  You have brown or red mucus.  You have yellow or brown nasal discharge.  You have pain in your face, especially when you bend forward.  You have a fever.  You have swollen neck glands.  You have pain while swallowing.  You have white areas in the back of your throat. Get help right away if:  You have severe or persistent:  Headache.  Ear pain.  Sinus pain.  Chest pain.  You have chronic lung disease and any of the following:  Wheezing.  Prolonged cough.  Coughing up blood.  A change in your usual mucus.  You have a stiff neck.  You have changes in your:  Vision.  Hearing.  Thinking.  Mood. This information is not intended to replace advice given to you by your health care provider. Make sure you discuss any questions you have with your health care provider. Document Released: 10/11/2000 Document Revised: 12/19/2015 Document Reviewed: 07/23/2013 Elsevier Interactive Patient Education  2017 [employer].

## 2016-04-06 ENCOUNTER — Ambulatory Visit (INDEPENDENT_AMBULATORY_CARE_PROVIDER_SITE_OTHER): Payer: BC Managed Care – PPO | Admitting: Family Medicine

## 2016-04-06 ENCOUNTER — Ambulatory Visit (INDEPENDENT_AMBULATORY_CARE_PROVIDER_SITE_OTHER): Payer: BC Managed Care – PPO

## 2016-04-06 ENCOUNTER — Ambulatory Visit (HOSPITAL_COMMUNITY)
Admission: RE | Admit: 2016-04-06 | Discharge: 2016-04-06 | Disposition: A | Discharge status: Home / Self Care | Payer: BC Managed Care – PPO | Source: Ambulatory Visit | Attending: Family Medicine | Admitting: Family Medicine

## 2016-04-06 ENCOUNTER — Encounter: Payer: Self-pay | Admitting: Family Medicine

## 2016-04-06 ENCOUNTER — Telehealth: Payer: Self-pay | Admitting: *Deleted

## 2016-04-06 VITALS — BP 136/84 | HR 79 | Temp 97.9°F | Resp 16 | Ht 68.5 in | Wt >= 6400 oz

## 2016-04-06 DIAGNOSIS — R600 Localized edema: Secondary | ICD-10-CM

## 2016-04-06 DIAGNOSIS — M7989 Other specified soft tissue disorders: Secondary | ICD-10-CM | POA: Insufficient documentation

## 2016-04-06 DIAGNOSIS — Z789 Other specified health status: Secondary | ICD-10-CM

## 2016-04-06 DIAGNOSIS — R079 Chest pain, unspecified: Secondary | ICD-10-CM

## 2016-04-06 DIAGNOSIS — Z86718 Personal history of other venous thrombosis and embolism: Secondary | ICD-10-CM

## 2016-04-06 DIAGNOSIS — M79661 Pain in right lower leg: Secondary | ICD-10-CM

## 2016-04-06 DIAGNOSIS — R0789 Other chest pain: Secondary | ICD-10-CM | POA: Diagnosis not present

## 2016-04-06 NOTE — Progress Notes (Signed)
By signing my name below, I, [name], attest that this documentation has been prepared under the direction and in the presence of [name], MD.  Electronically Signed: [name], Medical Scribe. 04/06/16. 3:08 PM.  Subjective:    Patient ID: Henry Hinton, male    DOB: June 05, 1960, 55 y.o.   MRN: [id number]  HPI Chief Complaint  Patient presents with  . Follow-up    6 WEEKS - labwork  . Chest Pain    started on left side of upper chest around left upper side - today    HPI Comments: Henry Hinton is a 55 y.o. male with a PMHx of CLL who presents to the [facility] for follow-up as well as left side chest pain. Last seen for physical 02/24/16 by me.  Chest Pain: Reports left sided chest pain that began this morning while driving to work and ended after class; initially it was painful, but now it's sore to the touch at site and under his left axialla. Reports feeling warm/flush during his chest pain. Denies SOB, fever, calf pain, nausea, coughing, GERD, beltching, bloat, and pain with deep breaths.  Forearm Pain: Burning pain in the left forearm. Denies chest pain.  CLL: Followed by Dr. [name] in [facility]. He's monitored every 3-4 months. WBS have ranged from 28-45,000 and was 39.8.at hematology visit 9/13. He is also under treatment at ENT for vocal chord paralysis. He had a CT scan of his soft tissue neck 9/24 with out explanation of vocal chord paralysis. CXR by Dr. Tamala Julian 11/17 was without acute cardio pulmonary disease. Reports feeling well after taking his abx for his cough. Denies cough.  Hyperglycemia/PreDM: based on last A1c, but A1c was in diabetic range earlier this year. He is not on any medications at this time. Lab Results  Component Value Date   HGBA1C 6.2 (H) 02/24/2016   Lab Results  Component Value Date   MICROALBUR 0.7 02/24/2016   Obesity with chronic lower extremity edema: On lasix 40-60m daily. Reports  uncomfortable hardening of his right lowe leg for the past 2 weeks (since around Thanksgiving) along with swelling and not similar to DVT pain. Pt drove 3.5 hours for Thanksgiving. Stopped taking extra 1/2 lasix dose due to urinary frequency and bilateral "kidney pain". He isn't sure if his lasix helped the hardness. Reports he walks a lot and can't remember doing any strenuous lifting the day before.  History of DVT: [place]. Completed course of Xarelto.  He was seen 11/17 by Dr. STamala Julianfor URI treated with augmentin and rocephin.  Patient Active Problem List   Diagnosis Date Noted  . Cellulitis 11/28/2015  . Cellulitis of trunk 11/27/2015  . Nonspecific abnormal finding in stool contents   . Colon cancer screening   . Venous stasis   . History of DVT (deep vein thrombosis)   . Sepsis ([place] 10/09/2015  . Panniculitis 10/09/2015  . CLL (chronic lymphocytic leukemia) ([place] 05/27/2015  . 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 Leukocytosis (leucocytosis) 08/29/2014  . HNP (herniated nucleus pulposus), cervical 01/21/2013  . bilat hip replacement 04/08/2012  . Morbid obesity ([place] 04/08/2012  . Edema 04/08/2012   Past Medical History:  Diagnosis Date  . Anemia   . Arthritis   . BMI 50.0-59.9, adult ([place]   . Cellulitis 11/2015   trunk  . cll 11/2014   CLL - [place] [place] .  DVT (deep venous thrombosis) ([place]) 6/16   Rt calf  took xarelto for 6 months  . Family history of anesthesia complication    " father having delirium after anesthesia"  . Heart murmur    Hx; of as a child  . History of kidney stones    x2  . Joint pain    Hx: of periodically  . Numbness and tingling    Hx: of in right arm  . Pneumonia   . Sleep apnea    Cpap use- settings 10   Past Surgical History:  Procedure Laterality Date  . COLONOSCOPY     Hx: of  . COLONOSCOPY N/A 11/23/2015   Procedure: COLONOSCOPY;  Surgeon:  Irene Shipper, MD;  Location: [facility];  Service: Endoscopy;  Laterality: N/A;  . INGUINAL HERNIA REPAIR Left 06/01/2015   Procedure: LEFT INGUINAL HERNIA REPAIR WITH MESH;  Surgeon: Autumn Messing III, MD;  Location: [facility]S;  Service: General;  Laterality: Left;  . INSERTION OF MESH Left 06/01/2015   Procedure: INSERTION OF MESH;  Surgeon: Autumn Messing III, MD;  Location: [facility];  Service: General;  Laterality: Left;  . JOINT REPLACEMENT Bilateral    BTHA  . KNEE SURGERY Left 07/21/2014   Dr. Noemi Chapel -scope  . LUMBAR LAMINECTOMY     Hx: of 2005  . POSTERIOR CERVICAL LAMINECTOMY WITH MET- RX Right 01/21/2013   Procedure: Right Sitting C6-7 Microdiskectomy with Met-rex;  Surgeon: Kristeen Miss, MD;  Location: [place] [facility];  Service: Neurosurgery;  Laterality: Right;  Right Sitting C6-7 Microdiskectomy with Met-rex  . SPINE SURGERY    . STENT PLACEMENT RT URETER ([place] HX)  10/23/2014  . tenosynovitis     Hx: of thumb  . TONSILLECTOMY    . TOTAL HIP ARTHROPLASTY     Hx: of B/L hips  . WISDOM TOOTH EXTRACTION  1974   all 4   Allergies  Allergen Reactions  . Adhesive [Tape] Other (See Comments)    Blisters and rash at site   Prior to Admission medications   Medication Sig Start Date End Date Taking? Authorizing Provider  aspirin EC 81 MG tablet Take 81 mg by mouth daily.   Yes Historical Provider, MD  ferrous sulfate (IRON SUPPLEMENT) 325 (65 FE) MG tablet Take 325 mg by mouth daily with breakfast.   Yes Historical Provider, MD  furosemide (LASIX) 40 MG tablet Take 1.5 tablets (60 mg total) by mouth daily. On alternating days with 67m. 02/24/16  Yes JWendie Agreste MD  ibuprofen (ADVIL,MOTRIN) 200 MG tablet Take 200 mg by mouth every 6 (six) hours as needed for moderate pain.   Yes Historical Provider, MD  Multiple Vitamins-Minerals (MULTIVITAMIN WITH MINERALS) tablet Take 1 tablet by mouth daily.   Yes Historical Provider, MD  polyethylene glycol (MIRALAX / GLYCOLAX) packet Take 17 g by mouth  daily. 10/14/15  Yes Hillary MCorinda Gubler MD  potassium chloride (KLOR-CON 10) 10 MEQ tablet TAKE 1 TABLET (10 MEQ TOTAL) BY MOUTH DAILY. 02/24/16  Yes JWendie Agreste MD  saccharomyces boulardii (FLORASTOR) 250 MG capsule Take 1 capsule (250 mg total) by mouth 2 (two) times daily. 11/29/15  Yes MJanece Canterbury MD   Social History   Social History  . Marital status: Divorced    Spouse name: N/A  . Number of children: N/A  . Years of education: N/A   Occupational History  . Teacher/Athletic Trainer [employer]  . Smoking status: Never  Smoker  . Smokeless tobacco: Former Systems developer    Types: Chew    Quit date: 05/01/1993  . Alcohol use 0.6 oz/week    1 Standard drinks or equivalent per week     Comment: [employer]  . Drug use: No  . Sexual activity: No   Other Topics Concern  . Not on file   Social History Narrative   Exercise swimming 3-5 times/week for 45 minutes   Review of Systems  Constitutional: Negative for fever.  Respiratory: Negative for cough and shortness of breath.   Cardiovascular: Positive for chest pain. Negative for leg swelling.  Gastrointestinal: Negative for abdominal distention and nausea.  Genitourinary: Negative for frequency.   Objective:  Physical Exam  Constitutional: He appears well-developed and well-nourished. No distress.  HENT:  Head: Normocephalic and atraumatic.  Eyes: Conjunctivae are normal.  Neck: Neck supple.  Cardiovascular: Normal rate, regular rhythm and normal heart sounds.  Exam reveals no gallop and no friction rub.   No murmur heard. Pulmonary/Chest: Effort normal and breath sounds normal. No respiratory distress. He has no wheezes. He has no rales.  Able to reproduce pain with palpation of left upper chest wall along with the lateral chest wall Skin intact No erythema No wound  Musculoskeletal: He exhibits edema.  Pitting edema lower extremity bilaterally There is a more firm  area on the medial aspect of right lower leg Does have some distal right calf pain with firm skin, but intact Minimal warmth, minimal erythema, no discharge Neg [place]  Neurological: He is alert.  Skin: Skin is warm, dry and intact. There is erythema (minimal).  Psychiatric: He has a normal mood and affect. His behavior is normal.  Nursing note and vitals reviewed.  BP 136/84 (BP Location: Left Arm, Patient Position: Sitting, Cuff Size: Large)   Pulse 79   Temp 97.9 F (36.6 C) (Oral)   Resp 16   Ht 5' 8.5" (1.74 m)   Wt (!) 408 lb 6.4 oz (185.2 kg)   SpO2 96%   BMI 61.19 kg/m    [EKG Reading]: Sinus rhythm. Rate 67. Flat t-wave lead III and V6, no acute findings, no significant changes from 11/19/14   Lower extremity ultrasound call report negative for DVT Dg Chest 2 View  Result Date: 04/06/2016 CLINICAL DATA:  Left-sided chest pain EXAM: CHEST  2 VIEW COMPARISON:  03/17/2016 FINDINGS: Heart and mediastinal contours are within normal limits. No focal opacities or effusions. No acute bony abnormality. IMPRESSION: No active cardiopulmonary disease. Electronically Signed   By: Rolm Baptise M.D.   On: 04/06/2016 15:51     Assessment & Plan:  Henry Hinton is a 55 y.o. male Pedal edema History of recent travel - Plan: [place] Venous Img Lower Unilateral Right, VAS [place] LOWER EXTREMITY VENOUS (DVT), CANCELED: VAS [place] LOWER EXTREMITY VENOUS (DVT) History of DVT (deep vein thrombosis) - Plan: [place] Venous Img Lower Unilateral Right, VAS [place] LOWER EXTREMITY VENOUS (DVT), CANCELED: VAS [place] LOWER EXTREMITY VENOUS (DVT) Right calf pain - Plan: US Venous Img Lower Unilateral Right, VAS [place] LOWER EXTREMITY VENOUS (DVT), CANCELED: VAS [place] LOWER EXTREMITY VENOUS (DVT)  - Ultrasound negative, suspect firm area right lower extremity chronic stasis changes without any wounds or apparent signs of cellulitis. Elevate legs, symptomatic care, RTC precautions if increased redness or wounds.  Left sided chest pain -  Plan: EKG 12-Lead, DG Chest 2 View Chest wall pain  - No acute findings on EKG or chest x-ray. No DVT  on ultrasound, doubt PE. Reproducible nature indicates musculoskeletal pain. Symptom Medicare, relative rest, range of motion, and RTC precautions if worsens or persists. ER/911 precautions if worsening chest pain as other imaging may be needed.  No orders of the defined types were placed in this encounter.  Patient Instructions    GO TO [place] NOW FOR YOUR [place] TEST AT [place]  [address]  IF you received an x-ray today, you will receive an invoice from [facility]. Please contact [facility] at [phone] with questions or concerns regarding your invoice.   IF you received labwork today, you will receive an invoice from [employer]. Please contact [facility] at [phone] with questions or concerns regarding your invoice.   Our billing staff will not be able to assist you with questions regarding bills from these companies.  You will be contacted with the lab results as soon as they are available. The fastest way to get your results is to activate your My Chart account. Instructions are located on the last page of this paperwork. If you have not heard from [place] regarding the results in 2 weeks, please contact this office.        I personally performed the services described in this documentation, which was scribed in my presence. The recorded information has been reviewed and considered, and addended by me as needed.   Signed,   [name], MD [facility].  04/08/16 4:55 PM

## 2016-04-06 NOTE — Patient Instructions (Addendum)
  GO TO [place] NOW FOR YOUR [place] TEST AT [place]  [address]  IF you received an x-ray today, you will receive an invoice from [facility]. Please contact [facility] at [phone] with questions or concerns regarding your invoice.   IF you received labwork today, you will receive an invoice from [employer]. Please contact [facility] at [phone] with questions or concerns regarding your invoice.   Our billing staff will not be able to assist you with questions regarding bills from these companies.  You will be contacted with the lab results as soon as they are available. The fastest way to get your results is to activate your My Chart account. Instructions are located on the last page of this paperwork. If you have not heard from [place] regarding the results in 2 weeks, please contact this office.

## 2016-04-06 NOTE — Progress Notes (Addendum)
*  PRELIMINARY RESULTS* Vascular Ultrasound Right lower extremity venous duplex has been completed.  Preliminary findings: No evidence of deep vein thrombosis or superficial vein thrombosis in the visualized veins of the right lower extremity. Difficult exam due to penetration.  Preliminary results called to Dr. Vonna Kotyk office and given to [facility]. Doctor will follow up, patient okay to leave.  Henry Hinton 04/06/2016, 5:03 PM

## 2016-06-08 DIAGNOSIS — R490 Dysphonia: Secondary | ICD-10-CM | POA: Insufficient documentation

## 2016-06-23 ENCOUNTER — Ambulatory Visit (INDEPENDENT_AMBULATORY_CARE_PROVIDER_SITE_OTHER): Payer: BC Managed Care – PPO

## 2016-06-23 ENCOUNTER — Ambulatory Visit (INDEPENDENT_AMBULATORY_CARE_PROVIDER_SITE_OTHER): Payer: BC Managed Care – PPO | Admitting: Physician Assistant

## 2016-06-23 VITALS — BP 118/72 | HR 68 | Temp 98.0°F | Resp 18 | Ht 68.5 in | Wt >= 6400 oz

## 2016-06-23 DIAGNOSIS — R05 Cough: Secondary | ICD-10-CM

## 2016-06-23 DIAGNOSIS — R0789 Other chest pain: Secondary | ICD-10-CM

## 2016-06-23 DIAGNOSIS — R059 Cough, unspecified: Secondary | ICD-10-CM

## 2016-06-23 DIAGNOSIS — C9110 Chronic lymphocytic leukemia of B-cell type not having achieved remission: Secondary | ICD-10-CM

## 2016-06-23 LAB — POCT CBC
GRANULOCYTE PERCENT: 17.4 % — AB (ref 37–80)
HCT, POC: 39.3 % — AB (ref 43.5–53.7)
Hemoglobin: 13.4 g/dL — AB (ref 14.1–18.1)
LYMPH, POC: 32.3 — AB (ref 0.6–3.4)
MCH, POC: 28.3 pg (ref 27–31.2)
MCHC: 34.1 g/dL (ref 31.8–35.4)
MCV: 82.8 fL (ref 80–97)
MID (cbc): 1.8 — AB (ref 0–0.9)
MPV: 8.1 fL (ref 0–99.8)
PLATELET COUNT, POC: 197 10*3/uL (ref 142–424)
POC Granulocyte: 7.2 — AB (ref 2–6.9)
POC LYMPH %: 78.2 % — AB (ref 10–50)
POC MID %: 4.4 %M (ref 0–12)
RBC: 4.75 M/uL (ref 4.69–6.13)
RDW, POC: 15.7 %
WBC: 41.3 10*3/uL — AB (ref 4.6–10.2)

## 2016-06-23 MED ORDER — HYDROCODONE-HOMATROPINE 5-1.5 MG/5ML PO SYRP: 2.5000 mL | ORAL_SOLUTION | Freq: Every day | ORAL | 0 refills | Status: AC

## 2016-06-23 MED ORDER — OSELTAMIVIR PHOSPHATE 75 MG PO CAPS: 75.0000 mg | ORAL_CAPSULE | Freq: Two times a day (BID) | ORAL | 0 refills | Status: DC

## 2016-06-23 MED ORDER — AZITHROMYCIN 250 MG PO TABS: ORAL_TABLET | ORAL | 0 refills | Status: AC

## 2016-06-23 NOTE — Progress Notes (Signed)
06/26/2016 8:50 AM   DOB: 27-May-1960 / MRN: [id number]  SUBJECTIVE:  Henry Hinton is a 56 y.o. male with a history of CLL presenting for cough and frontal sinus "pressure like"  HA that started two days ago. Associates nasal congestion and rhinorrhea.   Reports he is getting worse today. He has not had the flu shot.  Has tried ibuprofen with some relief of pain.    Tells me that he began having left sided substernal chest pain today as well however this is mostly with coughing.    He has a history of CLL.  He is managed and Henry for this problem and tells me this is stable. He has his white counts with him today via his phone.   Immunization History  Administered Date(s) Administered  . Tdap 07/31/2006     He is allergic to adhesive [tape].   He  has a past medical history of Anemia; Arthritis; BMI 50.0-59.9, adult ([place]); Cellulitis (11/2015); cll (11/2014); DVT (deep venous thrombosis) ([place]) (6/16); Family history of anesthesia complication; Heart murmur; History of kidney stones; Joint pain; Numbness and tingling; Pneumonia; and Sleep apnea.    He  reports that he has never smoked. He quit smokeless tobacco use about 23 years ago. His smokeless tobacco use included Chew. He reports that he drinks about 0.6 oz of alcohol per week . He reports that he does not use drugs. He  reports that he does not engage in sexual activity. The patient  has a past surgical history that includes Lumbar laminectomy; Total hip arthroplasty; tenosynovitis; Tonsillectomy; Colonoscopy; Posterior cervical laminectomy with met- rx (Right, 01/21/2013); Spine surgery; Knee surgery (Left, 07/21/2014); STENT PLACEMENT RT URETER ([facility] HX) (10/23/2014); Wisdom tooth extraction (1974); Inguinal hernia repair (Left, 06/01/2015); Insertion of mesh (Left, 06/01/2015); Joint replacement (Bilateral); and Colonoscopy (N/A, 11/23/2015).  His family history includes COPD in his maternal grandmother; Diabetes in his father;  Heart disease in his maternal grandfather; Lung cancer in his paternal grandfather; Prostate cancer in his father.  Review of Systems  Constitutional: Negative for chills and fever.  Respiratory: Negative for hemoptysis.   Gastrointestinal: Negative for nausea.  Skin: Negative for itching and rash.  Neurological: Negative for dizziness.    The problem list and medications were reviewed and updated by myself where necessary and exist elsewhere in the encounter.   OBJECTIVE:  BP 118/72 (BP Location: Right Arm, Patient Position: Sitting, Cuff Size: Large)   Pulse 68   Temp 98 F (36.7 C) (Oral)   Resp 18   Ht 5' 8.5" (1.74 m)   Wt (!) 411 lb 9.6 oz (186.7 kg)   SpO2 96%   BMI 61.67 kg/m   Physical Exam  Constitutional: He is oriented to person, place, and time.  Cardiovascular: Normal rate and regular rhythm.   Pulmonary/Chest: Effort normal and breath sounds normal. He exhibits tenderness.  Musculoskeletal: Normal range of motion.  Neurological: He is alert and oriented to person, place, and time.    Results for orders placed or performed in visit on 06/23/16 (from the past 72 hour(s))  POCT CBC     Status: Abnormal   Collection Time: 06/23/16  4:16 PM  Result Value Ref Range   WBC 41.3 (A) 4.6 - 10.2 K/uL   Lymph, poc 32.3 (A) 0.6 - 3.4   POC LYMPH PERCENT 78.2 (A) 10 - 50 %L   MID (cbc) 1.8 (A) 0 - 0.9   POC MID % 4.4 0 - 12 %  M   POC Granulocyte 7.2 (A) 2 - 6.9   Granulocyte percent 17.4 (A) 37 - 80 %G   RBC 4.75 4.69 - 6.13 M/uL   Hemoglobin 13.4 (A) 14.1 - 18.1 g/dL   HCT, POC 39.3 (A) 43.5 - 53.7 %   MCV 82.8 80 - 97 fL   MCH, POC 28.3 27 - 31.2 pg   MCHC 34.1 31.8 - 35.4 g/dL   RDW, POC 15.7 %   Platelet Count, POC 197 142 - 424 K/uL   MPV 8.1 0 - 99.8 fL   EKG: Negative for interval changes.  NSR, no evidence of ischemia or infarction.   No results found.  ASSESSMENT AND PLAN:  Trigo was seen today for cough and headache.  Diagnoses and all orders  for this visit:  Atypical chest pain: Non cardiac. See problem two.  -     EKG 12-Lead  Cough: He has not had the flu shot.  Will cover for that etiology as well as atypical pneumonia.  There is not significant differences in his white count when comparing today's measures to his most recent, aside from a mildly decreased leukocyte count.   -     POCT CBC -     DG Chest 2 View; Future -     oseltamivir (TAMIFLU) 75 MG capsule; Take 1 capsule (75 mg total) by mouth 2 (two) times daily. -     azithromycin (ZITHROMAX) 250 MG tablet; Take 2 tabs PO x 1 dose, then 1 tab PO QD x 4 days -     HYDROcodone-homatropine (HYCODAN) 5-1.5 MG/5ML syrup; Take 2.5-5 mLs by mouth at bedtime.  CLL (chronic lymphocytic leukemia) ([place]): Managed at [facility].     The patient is advised to call or return to clinic if he does not see an improvement in symptoms, or to seek the care of the closest emergency department if he worsens with the above plan.   Philis Fendt, MHS, PA-C [facility] 06/26/2016 8:50 AM

## 2016-06-23 NOTE — Patient Instructions (Signed)
     IF you received an x-ray today, you will receive an invoice from [facility]. Please contact [facility] at [phone] with questions or concerns regarding your invoice.   IF you received labwork today, you will receive an invoice from [facility]. Please contact [facility] at [phone] with questions or concerns regarding your invoice.   Our billing staff will not be able to assist you with questions regarding bills from these companies.  You will be contacted with the lab results as soon as they are available. The fastest way to get your results is to activate your My Chart account. Instructions are located on the last page of this paperwork. If you have not heard from us regarding the results in 2 weeks, please contact this office.     

## 2016-07-11 ENCOUNTER — Encounter: Payer: Self-pay | Admitting: Family Medicine

## 2016-07-12 ENCOUNTER — Encounter: Payer: Self-pay | Admitting: Family Medicine

## 2016-08-10 ENCOUNTER — Other Ambulatory Visit: Payer: Self-pay | Admitting: Family Medicine

## 2016-08-10 DIAGNOSIS — R600 Localized edema: Secondary | ICD-10-CM

## 2016-08-13 NOTE — Telephone Encounter (Signed)
Please review. Philis Fendt, MS, PA-C 8:20 AM, 08/13/2016

## 2016-09-08 ENCOUNTER — Emergency Department (HOSPITAL_COMMUNITY): Payer: BC Managed Care – PPO

## 2016-09-08 ENCOUNTER — Inpatient Hospital Stay (HOSPITAL_COMMUNITY)
Admission: EM | Admit: 2016-09-08 | Discharge: 2016-09-10 | DRG: 603 | Disposition: A | Discharge status: Home / Self Care | Payer: BC Managed Care – PPO | Attending: Family Medicine | Admitting: Family Medicine

## 2016-09-08 ENCOUNTER — Encounter (HOSPITAL_COMMUNITY): Payer: Self-pay | Admitting: Emergency Medicine

## 2016-09-08 DIAGNOSIS — Z7982 Long term (current) use of aspirin: Secondary | ICD-10-CM

## 2016-09-08 DIAGNOSIS — L03311 Cellulitis of abdominal wall: Principal | ICD-10-CM | POA: Diagnosis present

## 2016-09-08 DIAGNOSIS — Z91048 Other nonmedicinal substance allergy status: Secondary | ICD-10-CM | POA: Diagnosis not present

## 2016-09-08 DIAGNOSIS — Z86718 Personal history of other venous thrombosis and embolism: Secondary | ICD-10-CM | POA: Diagnosis not present

## 2016-09-08 DIAGNOSIS — Z6841 Body Mass Index (BMI) 40.0 and over, adult: Secondary | ICD-10-CM

## 2016-09-08 DIAGNOSIS — L0390 Cellulitis, unspecified: Secondary | ICD-10-CM | POA: Diagnosis present

## 2016-09-08 DIAGNOSIS — Z79899 Other long term (current) drug therapy: Secondary | ICD-10-CM

## 2016-09-08 DIAGNOSIS — Z87442 Personal history of urinary calculi: Secondary | ICD-10-CM | POA: Diagnosis not present

## 2016-09-08 DIAGNOSIS — Z96643 Presence of artificial hip joint, bilateral: Secondary | ICD-10-CM | POA: Diagnosis present

## 2016-09-08 DIAGNOSIS — M793 Panniculitis, unspecified: Secondary | ICD-10-CM | POA: Diagnosis present

## 2016-09-08 DIAGNOSIS — Z87891 Personal history of nicotine dependence: Secondary | ICD-10-CM

## 2016-09-08 DIAGNOSIS — R600 Localized edema: Secondary | ICD-10-CM | POA: Diagnosis present

## 2016-09-08 DIAGNOSIS — C9110 Chronic lymphocytic leukemia of B-cell type not having achieved remission: Secondary | ICD-10-CM | POA: Diagnosis present

## 2016-09-08 DIAGNOSIS — K4090 Unilateral inguinal hernia, without obstruction or gangrene, not specified as recurrent: Secondary | ICD-10-CM | POA: Diagnosis present

## 2016-09-08 DIAGNOSIS — I10 Essential (primary) hypertension: Secondary | ICD-10-CM | POA: Diagnosis present

## 2016-09-08 LAB — URINALYSIS, ROUTINE W REFLEX MICROSCOPIC
Bilirubin Urine: NEGATIVE
Glucose, UA: NEGATIVE mg/dL
Hgb urine dipstick: NEGATIVE
KETONES UR: NEGATIVE mg/dL
LEUKOCYTES UA: NEGATIVE
NITRITE: NEGATIVE
Protein, ur: NEGATIVE mg/dL
Specific Gravity, Urine: 1.032 — ABNORMAL HIGH (ref 1.005–1.030)
pH: 7 (ref 5.0–8.0)

## 2016-09-08 LAB — CBC WITH DIFFERENTIAL/PLATELET
BASOS ABS: 0 10*3/uL (ref 0.0–0.1)
Basophils Relative: 0 %
Eosinophils Absolute: 0 10*3/uL (ref 0.0–0.7)
Eosinophils Relative: 0 %
HCT: 41 % (ref 39.0–52.0)
Hemoglobin: 13.1 g/dL (ref 13.0–17.0)
LYMPHS ABS: 37 10*3/uL — AB (ref 0.7–4.0)
Lymphocytes Relative: 77 %
MCH: 27.9 pg (ref 26.0–34.0)
MCHC: 32 g/dL (ref 30.0–36.0)
MCV: 87.2 fL (ref 78.0–100.0)
MONO ABS: 0.5 10*3/uL (ref 0.1–1.0)
Monocytes Relative: 1 %
NEUTROS PCT: 22 %
Neutro Abs: 10.6 10*3/uL — ABNORMAL HIGH (ref 1.7–7.7)
PLATELETS: 183 10*3/uL (ref 150–400)
RBC: 4.7 MIL/uL (ref 4.22–5.81)
RDW: 16.1 % — AB (ref 11.5–15.5)
WBC: 48.1 10*3/uL — AB (ref 4.0–10.5)

## 2016-09-08 LAB — COMPREHENSIVE METABOLIC PANEL
ALT: 27 U/L (ref 17–63)
AST: 27 U/L (ref 15–41)
Albumin: 3.5 g/dL (ref 3.5–5.0)
Alkaline Phosphatase: 107 U/L (ref 38–126)
Anion gap: 8 (ref 5–15)
BILIRUBIN TOTAL: 0.6 mg/dL (ref 0.3–1.2)
BUN: 10 mg/dL (ref 6–20)
CO2: 25 mmol/L (ref 22–32)
CREATININE: 0.74 mg/dL (ref 0.61–1.24)
Calcium: 8.1 mg/dL — ABNORMAL LOW (ref 8.9–10.3)
Chloride: 105 mmol/L (ref 101–111)
Glucose, Bld: 166 mg/dL — ABNORMAL HIGH (ref 65–99)
POTASSIUM: 5.1 mmol/L (ref 3.5–5.1)
Sodium: 138 mmol/L (ref 135–145)
TOTAL PROTEIN: 6.9 g/dL (ref 6.5–8.1)

## 2016-09-08 LAB — I-STAT CG4 LACTIC ACID, ED: LACTIC ACID, VENOUS: 1.52 mmol/L (ref 0.5–1.9)

## 2016-09-08 LAB — MRSA PCR SCREENING: MRSA BY PCR: NEGATIVE

## 2016-09-08 MED ORDER — PIPERACILLIN-TAZOBACTAM 3.375 G IVPB 30 MIN: 3.3750 g | Freq: Once | INTRAVENOUS | Status: DC

## 2016-09-08 MED ORDER — IBUPROFEN 600 MG PO TABS: 600.0000 mg | ORAL_TABLET | Freq: Four times a day (QID) | ORAL | Status: DC | PRN

## 2016-09-08 MED ORDER — SODIUM CHLORIDE 0.9 % IV BOLUS (SEPSIS)
1000.0000 mL | Freq: Once | INTRAVENOUS | Status: AC
  Administered 2016-09-08: 1000 mL via INTRAVENOUS

## 2016-09-08 MED ORDER — VANCOMYCIN HCL 10 G IV SOLR
1500.0000 mg | Freq: Two times a day (BID) | INTRAVENOUS | Status: DC
  Filled 2016-09-08: qty 1500

## 2016-09-08 MED ORDER — IOPAMIDOL (ISOVUE-300) INJECTION 61%
INTRAVENOUS | Status: AC
  Administered 2016-09-08: 100 mL via INTRAVENOUS
  Filled 2016-09-08: qty 100

## 2016-09-08 MED ORDER — VANCOMYCIN HCL IN DEXTROSE 1-5 GM/200ML-% IV SOLN
1000.0000 mg | Freq: Once | INTRAVENOUS | Status: AC
  Administered 2016-09-08: 1000 mg via INTRAVENOUS
  Filled 2016-09-08: qty 200

## 2016-09-08 MED ORDER — HYDRALAZINE HCL 20 MG/ML IJ SOLN: 5.0000 mg | Freq: Four times a day (QID) | INTRAMUSCULAR | Status: DC | PRN

## 2016-09-08 MED ORDER — PIPERACILLIN-TAZOBACTAM 3.375 G IVPB
3.3750 g | Freq: Three times a day (TID) | INTRAVENOUS | Status: DC
  Filled 2016-09-08 (×2): qty 50

## 2016-09-08 MED ORDER — ACETAMINOPHEN 650 MG RE SUPP: 650.0000 mg | Freq: Four times a day (QID) | RECTAL | Status: DC | PRN

## 2016-09-08 MED ORDER — ACETAMINOPHEN 325 MG PO TABS: 650.0000 mg | ORAL_TABLET | Freq: Four times a day (QID) | ORAL | Status: DC | PRN

## 2016-09-08 MED ORDER — FUROSEMIDE 40 MG PO TABS
40.0000 mg | ORAL_TABLET | Freq: Every day | ORAL | Status: DC
  Administered 2016-09-08 – 2016-09-10 (×3): 40 mg via ORAL
  Filled 2016-09-08 (×3): qty 1

## 2016-09-08 MED ORDER — VANCOMYCIN HCL 500 MG IV SOLR
500.0000 mg | INTRAVENOUS | Status: AC
  Administered 2016-09-08: 500 mg via INTRAVENOUS
  Filled 2016-09-08 (×2): qty 500

## 2016-09-08 MED ORDER — ASPIRIN EC 81 MG PO TBEC
81.0000 mg | DELAYED_RELEASE_TABLET | Freq: Every day | ORAL | Status: DC
  Administered 2016-09-08 – 2016-09-10 (×3): 81 mg via ORAL
  Filled 2016-09-08 (×3): qty 1

## 2016-09-08 MED ORDER — VANCOMYCIN HCL 10 G IV SOLR
1500.0000 mg | Freq: Two times a day (BID) | INTRAVENOUS | Status: DC
  Administered 2016-09-09 – 2016-09-10 (×3): 1500 mg via INTRAVENOUS
  Filled 2016-09-08 (×3): qty 1500

## 2016-09-08 MED ORDER — SACCHAROMYCES BOULARDII 250 MG PO CAPS
250.0000 mg | ORAL_CAPSULE | Freq: Two times a day (BID) | ORAL | Status: DC
  Administered 2016-09-08 – 2016-09-10 (×4): 250 mg via ORAL
  Filled 2016-09-08 (×4): qty 1

## 2016-09-08 MED ORDER — PIPERACILLIN-TAZOBACTAM 3.375 G IVPB 30 MIN
3.3750 g | Freq: Once | INTRAVENOUS | Status: AC
  Administered 2016-09-08: 3.375 g via INTRAVENOUS
  Filled 2016-09-08: qty 50

## 2016-09-08 MED ORDER — ENOXAPARIN SODIUM 40 MG/0.4ML ~~LOC~~ SOLN
40.0000 mg | SUBCUTANEOUS | Status: DC
  Administered 2016-09-08: 40 mg via SUBCUTANEOUS
  Filled 2016-09-08: qty 0.4

## 2016-09-08 NOTE — ED Provider Notes (Signed)
[facility] DEPT Provider Note   CSN: [id number] Arrival date & time: 09/08/16  0907     History   Chief Complaint Chief Complaint  Patient presents with  . Cellulitis    HPI Henry Hinton is a 56 y.o. male.  HPI   56 year old male with a history of CLL, DVT, nephrolithiasis, morbid obesity, prior left inguinal hernia repair complicated by abscess and cellulitis, prior admission for abdominal wall cellulitis, presents with concern for fever and cellulitis of his abdominal wall. Patient reports that last night after dinner, he began to feel chills, just not feeling well, and then began to feel fevers of the night went on. Took Tylenol at midnight, and at 4 AM checked his temperature and it was 101.4. He did not have any other associated symptoms at that time, no cough, no dysuria, no congestion, no sore throat, no noted rash. Patient noted upon waking this morning that he had a right-sided abdominal wall cellulitis which was not present yesterday. Reports that the pain is mild, except when the area is palpated. Denies other nausea, vomiting, diarrhea or deeper abdominal pain.   Past Medical History:  Diagnosis Date  . Anemia   . Arthritis   . BMI 50.0-59.9, adult ([place])   . Cellulitis 11/2015   trunk  . cll 11/2014   CLL - 11/30/2014- Dr. [facility] [place], [place]  . DVT (deep venous thrombosis) ([place]) 6/16   Rt calf  took xarelto for 6 months  . Family history of anesthesia complication    " father having delirium after anesthesia"  . Heart murmur    Hx; of as a child  . History of kidney stones    x2  . Joint pain    Hx: of periodically  . Numbness and tingling    Hx: of in right arm  . Pneumonia   . Sleep apnea    Cpap use- settings 10    Patient Active Problem List   Diagnosis Date Noted  . Hoarseness 06/08/2016  . Cellulitis 11/28/2015  . Cellulitis of trunk 11/27/2015  . Nonspecific abnormal finding in stool contents   . Colon cancer screening    . Venous stasis   . History of DVT (deep vein thrombosis)   . Sepsis ([place]) 10/09/2015  . Panniculitis 10/09/2015  . CLL (chronic lymphocytic leukemia) ([place]) 05/27/2015  . 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 Leukocytosis (leucocytosis) 08/29/2014  . HNP (herniated nucleus pulposus), cervical 01/21/2013  . bilat hip replacement 04/08/2012  . Morbid obesity ([place]) 04/08/2012  . Edema 04/08/2012    Past Surgical History:  Procedure Laterality Date  . COLONOSCOPY     Hx: of  . COLONOSCOPY N/A 11/23/2015   Procedure: COLONOSCOPY;  Surgeon: Irene Shipper, MD;  Location: [facility];  Service: Endoscopy;  Laterality: N/A;  . INGUINAL HERNIA REPAIR Left 06/01/2015   Procedure: LEFT INGUINAL HERNIA REPAIR WITH MESH;  Surgeon: Autumn Messing III, MD;  Location: [facility];  Service: General;  Laterality: Left;  . INSERTION OF MESH Left 06/01/2015   Procedure: INSERTION OF MESH;  Surgeon: Autumn Messing III, MD;  Location: [facility];  Service: General;  Laterality: Left;  . JOINT REPLACEMENT Bilateral    BTHA  . KNEE SURGERY Left 07/21/2014   Dr. Noemi Chapel -scope  . LUMBAR LAMINECTOMY     Hx: of 2005  . POSTERIOR CERVICAL LAMINECTOMY WITH MET- RX Right 01/21/2013   Procedure: Right Sitting C6-7 Microdiskectomy with Met-rex;  Surgeon: Kristeen Miss, MD;  Location:  [facility];  Service: Neurosurgery;  Laterality: Right;  Right Sitting C6-7 Microdiskectomy with Met-rex  . SPINE SURGERY    . STENT PLACEMENT RT URETER ([place] HX)  10/23/2014  . tenosynovitis     Hx: of thumb  . TONSILLECTOMY    . TOTAL HIP ARTHROPLASTY     Hx: of B/L hips  . WISDOM TOOTH EXTRACTION  1974   all 4       Home Medications    Prior to Admission medications   Medication Sig Start Date End Date Taking? Authorizing Provider  aspirin EC 81 MG tablet Take 81 mg by mouth daily.    [provider]  ferrous sulfate (IRON SUPPLEMENT) 325 (65 FE) MG tablet Take 325 mg by mouth daily with breakfast.    [provider]    furosemide (LASIX) 40 MG tablet TAKE 1 TABLET BY MOUTH EVERY DAY 08/13/16   [name], MD  ibuprofen (ADVIL,MOTRIN) 200 MG tablet Take 200 mg by mouth every 6 (six) hours as needed for moderate pain.    [provider]  KLOR-CON 10 10 MEQ tablet TAKE 1 TABLET BY MOUTH EVERY DAY 08/13/16   [name], MD  Multiple Vitamins-Minerals (MULTIVITAMIN WITH MINERALS) tablet Take 1 tablet by mouth daily.    [provider]  oseltamivir (TAMIFLU) 75 MG capsule Take 1 capsule (75 mg total) by mouth 2 (two) times daily. 06/23/16   Tereasa Coop, PA-C  saccharomyces boulardii (FLORASTOR) 250 MG capsule Take 1 capsule (250 mg total) by mouth 2 (two) times daily. 11/29/15   Janece Canterbury, MD    Family History Family History  Problem Relation Age of Onset  . COPD Maternal Grandmother   . Heart disease Maternal Grandfather   . Lung cancer Paternal Grandfather        LUNG  . Diabetes Father   . Prostate cancer Father        PROSTATE    Social History Social History  Substance Use Topics  . Smoking status: Never Smoker  . Smokeless tobacco: Former Systems developer    Types: Chew    Quit date: 05/01/1993  . Alcohol use 0.6 oz/week    1 Standard drinks or equivalent per week     Comment: BEER AND LIQUOR     Allergies   Adhesive [tape]   Review of Systems Review of Systems  Constitutional: Positive for chills, fatigue and fever.  HENT: Negative for congestion and sore throat.   Eyes: Negative for visual disturbance.  Respiratory: Negative for cough and shortness of breath.   Cardiovascular: Negative for chest pain.  Gastrointestinal: Negative for abdominal pain, diarrhea, nausea and vomiting.  Genitourinary: Negative for difficulty urinating and dysuria.  Musculoskeletal: Negative for back pain and neck stiffness.  Skin: Positive for rash.  Neurological: Negative for syncope and headaches.     Physical Exam Updated Vital Signs BP (!) 166/87   Pulse 87   Temp  98.4 F (36.9 C) (Oral)   Resp 20   SpO2 99%   Physical Exam  Constitutional: He is oriented to person, place, and time. He appears well-developed and well-nourished. No distress.  HENT:  Head: Normocephalic and atraumatic.  Eyes: Conjunctivae and EOM are normal.  Neck: Normal range of motion.  Cardiovascular: Normal rate, regular rhythm, normal heart sounds and intact distal pulses.  Exam reveals no gallop and no friction rub.   No murmur heard. Pulmonary/Chest: Effort normal and breath sounds normal. No respiratory distress. He has  no wheezes. He has no rales.  Abdominal: Soft. He exhibits no distension. There is tenderness (over area of cellulitis and inferior to area of cellulitis). There is no guarding.  Deep erythema right abdomen, approx 50cm x 40cm, Mild erythema inguinal hernia scar on left  Musculoskeletal: He exhibits edema.  Neurological: He is alert and oriented to person, place, and time.  Skin: Skin is warm and dry. He is not diaphoretic.  Nursing note and vitals reviewed.    ED Treatments / Results  Labs (all labs ordered are listed, but only abnormal results are displayed) Labs Reviewed  CBC WITH DIFFERENTIAL/PLATELET - Abnormal; Notable for the following:       Result Value   WBC 48.1 (*)    RDW 16.1 (*)    All other components within normal limits  CULTURE, BLOOD (ROUTINE X 2)  CULTURE, BLOOD (ROUTINE X 2)  COMPREHENSIVE METABOLIC PANEL  URINALYSIS, ROUTINE W REFLEX MICROSCOPIC  I-STAT CG4 LACTIC ACID, ED    EKG  EKG Interpretation None       Radiology No results found.  Procedures Procedures (including critical care time)  Medications Ordered in ED Medications  piperacillin-tazobactam (ZOSYN) IVPB 3.375 g (not administered)  sodium chloride 0.9 % bolus 1,000 mL (not administered)  sodium chloride 0.9 % bolus 1,000 mL (not administered)  vancomycin (VANCOCIN) IVPB 1000 mg/200 mL premix (not administered)     Initial Impression /  Assessment and Plan / ED Course  I have reviewed the triage vital signs and the nursing notes.  Pertinent labs & imaging results that were available during my care of the patient were reviewed by me and considered in my medical decision making (see chart for details).    56 year old male with a history of CLL, DVT, nephrolithiasis, morbid obesity, prior left inguinal hernia repair complicated by abscess and cellulitis, prior admission for abdominal wall cellulitis, presents with concern for fever and cellulitis of his abdominal wall.  Patient afebrile on arrival to the emergency department, however with large area of cellulitis to the abdominal wall.  Given history of prior abdominal abscess, CT abdomen and pelvis was ordered. Given fever at home, rapid development of cellulitis, ordered blood cultures, vancomycin and Zosyn, and we'll plan on admission for IV antibiotics.  CT shows signs of cellulitis without other abnormalities.  Patient admitted for continued care.  Lactic acid WNL. Leukocytosis increased from baseline CLL.  Final Clinical Impressions(s) / ED Diagnoses   Final diagnoses:  Cellulitis of abdominal wall    New Prescriptions New Prescriptions   No medications on file     Gareth Morgan, MD 09/08/16 [phone]

## 2016-09-08 NOTE — Progress Notes (Signed)
Pharmacy Antibiotic Note Henry Hinton is a 56 y.o. male admitted on 09/08/2016 with recurrent abdominal wall cellulitis.  Pharmacy has been consulted for vancomycin dosing.  Plan: 1. Vancomycin 1500 IV every 12 hours.  Goal trough 10-15 mcg/mL.  2. SCr every 72 hours while on vancomycin    Temp (24hrs), Avg:98.4 F (36.9 C), Min:98.4 F (36.9 C), Max:98.4 F (36.9 C)   Recent Labs Lab 09/08/16 0916 09/08/16 0940  WBC 48.1*  --   CREATININE 0.74  --   LATICACIDVEN  --  1.52    CrCl cannot be calculated (Unknown ideal weight.).    Allergies  Allergen Reactions  . Adhesive [Tape] Other (See Comments)    Blisters and rash at site    Antimicrobials this admission: 5/11 Zosyn x in ED  5/11 vancomycin  >> px  Microbiology results: 5/11 BCx: px  Thank you for allowing pharmacy to be a part of this patient's care.  Vincenza Hews, PharmD, BCPS 09/08/2016, 10:13 AM

## 2016-09-08 NOTE — ED Notes (Signed)
Assessment: redness  noted on the lower abd. Patient rates pain 8/10. Weeping noted. Patient reports Cellulitis in same area in the past.

## 2016-09-08 NOTE — ED Notes (Signed)
Report Given at [facility]

## 2016-09-08 NOTE — Progress Notes (Signed)
Pharmacy Antibiotic Note Henry Hinton is a 56 y.o. male admitted on 09/08/2016 with recurrent abdominal wall cellulitis.  Pharmacy has been consulted for vancomycin and zosyn dosing.  Plan: Vancomycin 1500 IV every 12 hours.  Goal trough 10-15 mcg/mL. Zosyn 3.375g IV q8h (4 hour infusion).  Monitor culture data, renal function and clinical course VT at SS prn   Temp (24hrs), Avg:99.2 F (37.3 C), Min:98.4 F (36.9 C), Max:100 F (37.8 C)   Recent Labs Lab 09/08/16 0916 09/08/16 0940  WBC 48.1*  --   CREATININE 0.74  --   LATICACIDVEN  --  1.52    CrCl cannot be calculated (Unknown ideal weight.).    Allergies  Allergen Reactions  . Adhesive [Tape] Other (See Comments)    Blisters and rash at site    [facility]. Diona Foley, PharmD, BCPS Clinical Pharmacist [phone]  09/08/2016, 2:31 PM

## 2016-09-08 NOTE — ED Triage Notes (Signed)
Pt reports red, painful area to abdomen that was present upon waking this am, hx of the same.

## 2016-09-08 NOTE — H&P (Signed)
[facility] Admission History and Physical Service Pager: [phone]  Patient name: Henry Hinton Medical record number: [id number] Date of birth: Sep 24, 1960 Age: 56 y.o. Gender: male  Primary Care Provider: [name], MD Consultants: None Code Status: Full  Chief Complaint: pain on a  Assessment and Plan: Henry Hinton is a 56 y.o. male presenting with abdominal wall cellulitis . PMH is significant for LE edema (no formal dx heart failure),   Cellulitis/Panniculitis  - low grade fevers at home 100.24F, none documented since admission. , Significant leukocytosis with WBC 48, though may be chronic with CLL as this was elevated at 41 2 months ago. Lactic acid normal. CT abdomen c/w cellulitis, no focal abscess.  History of abdominal cellulitis previously in the same area.  Considered if patient has diabetes as a risk factor given obesity and this would put patient at increased risk for cellulitis. Previously with A1c at 6.9 in 05/2015 but more recently 6.2 in 01/2016.  - Admit to [facility]PT[facility] attending Dr. Nori Hinton - Treat with IV vancomycin 1000 mg at 200 ml/hr  - follow up BCx - will check HbA1c - monitor fever curve - vitals per unit - monitor cellulitis margins - transition to PO antibiotics when able - pain control with ibuprofen and tylenol  HTN - noted in ED with BP 164/98. No formal diagnosis of HTN.  Endorses taking lasix 40 mg PO daily at home for LE swelling which he has not taken yet today - restart home lasix - monitor BP  Lower Extremity Edema, chronic - May be c/w lymphedema. Patient carries no formal diagnosis of CHF, and no previous echo. Patient had a myoview in 2015 which was a low-risk study and EF was WNL at that time. - continue home lasix - hold home KDUR, monitor K with daily BMP and replete PRN  CLL-  Followed by Henry Hinton in [facility]. He's monitored every 3-4 months. WBS have ranged from 28-45,000.  - no current  medications  Morbid Obesity - weight loss counseling  Right Inguinal Hernia: Noted on the CT to have some surrounding fluid, though no bowel herniation is seen. Patient does not indicate any pain   FEN/GI: Regular diet, replete K as needed Prophylaxis: Lovenox  Disposition: Admit to Med-Surg for IV antibiotics, anticipate 2-3 day hospital stay  History of Present Illness:  Henry Hinton is a 56 y.o. male presenting with abdominal wall cellulitis.  Last night patient felt feverish overnight, and he was noted to have rigors by his mo - noted to have rigors by mother last night. Patient noted a temperature of 100.24F this morning. Patient woke up this morning and noticed his abdomen was very warm this morning.  He noticed a large area of redness without drainage in his lower abdomen. There were small abrasians in the area which he thought were due to his boxers being too tight overnight. He indicates a previous episode of cellulitis in that area.  Patient had also had cracks in the skin which were thought to be source of cellulitis in the past. Since then he has been treating the skin with lotions to prevent the skin from cracking. No N/V/D/C, no URI symptoms or urinary symptoms.   Of note the patient has a history of inguinal hernia repair in 05/2015.  During his previous episode of cellulitis there was concern that it was associated with hernia repair and previous fistula, however his episode of lower abdominal cellulitis in 10/2015 was  thought to be unrelated to the hernia repair per the patient.  Review Of Systems: Per HPI with the following additions:   Review of Systems  Constitutional: Positive for fever.  HENT: Negative for congestion and sore throat.   Eyes: Negative for blurred vision and double vision.  Respiratory: Negative for cough and shortness of breath.   Cardiovascular: Negative for chest pain and palpitations.  Gastrointestinal: Negative for nausea and vomiting.   Musculoskeletal: Negative for myalgias.  Skin: Positive for rash.  Neurological: Negative for dizziness and headaches.    Patient Active Problem List   Diagnosis Date Noted  . Hoarseness 06/08/2016  . Cellulitis 11/28/2015  . Cellulitis of trunk 11/27/2015  . Nonspecific abnormal finding in stool contents   . Colon cancer screening   . Venous stasis   . History of DVT (deep vein thrombosis)   . Sepsis ([place]) 10/09/2015  . Panniculitis 10/09/2015  . CLL (chronic lymphocytic leukemia) ([place]) 05/27/2015  . 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 Leukocytosis (leucocytosis) 08/29/2014  . HNP (herniated nucleus pulposus), cervical 01/21/2013  . bilat hip replacement 04/08/2012  . Morbid obesity ([place]) 04/08/2012  . Edema 04/08/2012    Past Medical History: Past Medical History:  Diagnosis Date  . Anemia   . Arthritis   . BMI 50.0-59.9, adult ([place])   . Cellulitis 11/2015   trunk  . cll 11/2014   CLL - 11/30/2014- Dr. [facility] [place], [place]  . DVT (deep venous thrombosis) ([place]) 6/16   Rt calf  took xarelto for 6 months  . Family history of anesthesia complication    " father having delirium after anesthesia"  . Heart murmur    Hx; of as a child  . History of kidney stones    x2  . Joint pain    Hx: of periodically  . Numbness and tingling    Hx: of in right arm  . Pneumonia   . Sleep apnea    Cpap use- settings 10    Past Surgical History: Past Surgical History:  Procedure Laterality Date  . COLONOSCOPY     Hx: of  . COLONOSCOPY N/A 11/23/2015   Procedure: COLONOSCOPY;  Surgeon: Irene Shipper, MD;  [facility];  Service: Endoscopy;  Laterality: N/A;  . INGUINAL HERNIA REPAIR Left 06/01/2015   Procedure: LEFT INGUINAL HERNIA REPAIR WITH MESH;  Surgeon: Autumn Messing III, MD;  [facility];  Service: General;  Laterality: Left;  . INSERTION OF MESH Left 06/01/2015   Procedure: INSERTION OF MESH;  Surgeon: Autumn Messing III, MD;  [facility];  Service:  General;  Laterality: Left;  . JOINT REPLACEMENT Bilateral    BTHA  . KNEE SURGERY Left 07/21/2014   Dr. Noemi Chapel -scope  . LUMBAR LAMINECTOMY     Hx: of 2005  . POSTERIOR CERVICAL LAMINECTOMY WITH MET- RX Right 01/21/2013   Procedure: Right Sitting C6-7 Microdiskectomy with Met-rex;  Surgeon: Kristeen Miss, MD;  Location: [place] [facility];  Service: Neurosurgery;  Laterality: Right;  Right Sitting C6-7 Microdiskectomy with Met-rex  . SPINE SURGERY    . STENT PLACEMENT RT URETER ([place] HX)  10/23/2014  . tenosynovitis     Hx: of thumb  . TONSILLECTOMY    . TOTAL HIP ARTHROPLASTY     Hx: of B/L hips  . WISDOM TOOTH EXTRACTION  1974   all 4    Social History: Social History  Substance Use Topics  . Smoking status: Never Smoker  . Smokeless tobacco: Former  User    Types: Sarina Ser    Quit date: 05/01/1993  . Alcohol use 0.6 oz/week    1 Standard drinks or equivalent per week     Comment: BEER AND LIQUOR   Additional social history:  Please also refer to relevant sections of EMR.  Family History: Family History  Problem Relation Age of Onset  . COPD Maternal Grandmother   . Heart disease Maternal Grandfather   . Lung cancer Paternal Grandfather        LUNG  . Diabetes Father   . Prostate cancer Father        PROSTATE    Allergies and Medications: Allergies  Allergen Reactions  . Adhesive [Tape] Other (See Comments)    Blisters and rash at site   No current facility-administered medications on file prior to encounter.    Current Outpatient Prescriptions on File Prior to Encounter  Medication Sig Dispense Refill  . aspirin EC 81 MG tablet Take 81 mg by mouth daily.    . ferrous sulfate (IRON SUPPLEMENT) 325 (65 FE) MG tablet Take 325 mg by mouth daily with breakfast.    . furosemide (LASIX) 40 MG tablet TAKE 1 TABLET BY MOUTH EVERY DAY 90 tablet 0  . ibuprofen (ADVIL,MOTRIN) 200 MG tablet Take 800 mg by mouth every 6 (six) hours as needed for moderate pain.     Marland Kitchen KLOR-CON 10 10  MEQ tablet TAKE 1 TABLET BY MOUTH EVERY DAY 90 tablet 0  . Multiple Vitamins-Minerals (MULTIVITAMIN WITH MINERALS) tablet Take 1 tablet by mouth daily.    Marland Kitchen saccharomyces boulardii (FLORASTOR) 250 MG capsule Take 1 capsule (250 mg total) by mouth 2 (two) times daily. 60 capsule 3    Objective: BP (!) 169/72 (BP Location: Right Wrist)   Pulse 92   Temp 100 F (37.8 C) (Oral)   Resp 20   Ht '5\' 9"'  (1.753 m)   Wt (!) 187.8 kg (414 lb)   SpO2 97%   BMI 61.14 kg/m  Exam: General: NAD, rests comfortably in bed, morbidly obese gentleman Eyes: PERRL, EOMI, no scleral icterus, no conjunctival injection ENTM: mucous membranes moist, no pharyngeal erythema or exudate Neck: full ROM, supple Cardiovascular: RRR, no m/r/g Respiratory: CTA Bil, no W/R?R Gastrointestinal: +large pannus with area of red, tender, warmth (picture below), no rebound tenderness or guarding MSK: moves 4 extremities equally Derm: Cellulitis as noted above, also LE chronic skin changes bil from lymphedema Neuro: CN II-XII Grossly intact Psych: AAOx3, affect appropriate  Media Information     Media Information        Labs and Imaging: CBC BMET   Recent Labs Lab 09/08/16 0916  WBC 48.1*  HGB 13.1  HCT 41.0  PLT 183    Recent Labs Lab 09/08/16 0916  NA 138  K 5.1  CL 105  CO2 25  BUN 10  CREATININE 0.74  GLUCOSE 166*  CALCIUM 8.1*     Ct Abdomen Pelvis W Contrast 09/08/2016 FINDINGS:  Lower chest: Within normal limits. Hepatobiliary: Diffuse fatty infiltration of the liver is noted. The gallbladder is within normal limits in well distended. Pancreas: Unremarkable. No pancreatic ductal dilatation or surrounding inflammatory changes. Spleen: Normal in size without focal abnormality. Adrenals/Urinary Tract: The adrenal glands are within normal limits. The kidneys demonstrate scattered hypodensities consistent with cysts. A tiny right renal stone is noted best seen on the coronal imaging. No obstructive  changes are seen. The bladder is well distended. Stomach/Bowel: No obstructive or inflammatory changes are  noted. The appendix is within normal limits. Vascular/Lymphatic: Aortic atherosclerosis. No enlarged abdominal or pelvic lymph nodes. Reproductive: Prostate is unremarkable. Other: There is slight progression in the right inguinal hernia with some fluid within. No herniation of bowel is noted. Postsurgical changes are noted on the left consistent with prior hernia repair. No recurrent hernia is seen. Some persistent thickening of the skin of the anterior abdominal wall particularly on the right is noted. It is overall improved from the prior exam however and likely represents a recurrent cellulitis. No subcutaneous abscess is seen. 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 Degenerative changes of the lumbar spine are noted.   IMPRESSION: Tiny nonobstructing right renal stone. Progression in size of a right inguinal hernia containing some fluid although no bowel herniation is seen. Persistent but improved skin thickening in the anterior abdominal wall consistent with the given clinical history of cellulitis. No focal abscess is seen. Chronic changes as described above.    Everrett Coombe, MD 09/08/2016, 2:44 PM PGY-1, [facility] Intern pager: [phone], text pages welcome  UPPER LEVEL ADDENDUM  I have read the above note and made revisions highlighted in blue.  Kerrin Mo, MD, PGY-2 [facility]

## 2016-09-08 NOTE — ED Notes (Signed)
Admitting at bedside 

## 2016-09-09 LAB — BASIC METABOLIC PANEL
Anion gap: 8 (ref 5–15)
BUN: 8 mg/dL (ref 6–20)
CALCIUM: 7.8 mg/dL — AB (ref 8.9–10.3)
CO2: 25 mmol/L (ref 22–32)
CREATININE: 0.63 mg/dL (ref 0.61–1.24)
Chloride: 104 mmol/L (ref 101–111)
GFR calc Af Amer: >60 mL/min (ref >60)
GLUCOSE: 143 mg/dL — AB (ref 65–99)
Potassium: 3.4 mmol/L — ABNORMAL LOW (ref 3.5–5.1)
SODIUM: 137 mmol/L (ref 135–145)

## 2016-09-09 LAB — CBC
HEMATOCRIT: 38.2 % — AB (ref 39.0–52.0)
HEMOGLOBIN: 12 g/dL — AB (ref 13.0–17.0)
MCH: 27.5 pg (ref 26.0–34.0)
MCHC: 31.4 g/dL (ref 30.0–36.0)
MCV: 87.4 fL (ref 78.0–100.0)
PLATELETS: 148 10*3/uL — AB (ref 150–400)
RBC: 4.37 MIL/uL (ref 4.22–5.81)
RDW: 16.2 % — ABNORMAL HIGH (ref 11.5–15.5)
WBC: 41.8 10*3/uL — ABNORMAL HIGH (ref 4.0–10.5)

## 2016-09-09 LAB — HIV ANTIBODY (ROUTINE TESTING W REFLEX): HIV Screen 4th Generation wRfx: NONREACTIVE

## 2016-09-09 MED ORDER — POTASSIUM CHLORIDE CRYS ER 10 MEQ PO TBCR
10.0000 meq | EXTENDED_RELEASE_TABLET | Freq: Once | ORAL | Status: AC
  Administered 2016-09-09: 10 meq via ORAL
  Filled 2016-09-09: qty 1

## 2016-09-09 MED ORDER — ENOXAPARIN SODIUM 100 MG/ML ~~LOC~~ SOLN
90.0000 mg | SUBCUTANEOUS | Status: DC
  Administered 2016-09-09: 90 mg via SUBCUTANEOUS
  Filled 2016-09-09: qty 1

## 2016-09-09 NOTE — Progress Notes (Signed)
Family Medicine Teaching Service Daily Progress Note Intern Pager: [phone]  Patient name: Henry Hinton Medical record number: [id number] Date of birth: 1960/06/16 Age: 56 y.o. Gender: male  Primary Care Provider: [name], MD Consultants: None  Code Status: Full  Pt Overview and Major Events to Date:   Assessment and Plan: Henry Hinton is a 56 y.o. male presenting with abdominal wall cellulitis . PMH is significant for LE edema (no formal dx heart failure),   Cellulitis/Panniculitis:  WBC 48>41.8. CT abdomen c/w cellulitis, no focal abscess.  - Vancomycin per pharmacy  - Blood cultures pending  - will check HbA1c 6.2 >  - vitals per unit - monitor cellulitis margins - pain control with ibuprofen and tylenol  HTN - noted in ED with BP 164/98. No formal diagnosis of HTN.  Endorses taking lasix 40 mg PO daily at home for LE swelling which he has not taken yet today - continue home lasix - monitor BP  Lower Extremity Edema, chronic - May be c/w lymphedema. Patient carries no formal diagnosis of CHF, and no previous echo. Patient had a myoview in 2015 which was a low-risk study and EF was WNL at that time. - continue home lasix - hold home KDUR, monitor K with daily BMP and replete PRN  CLL- Followed by Dr. [name] in [facility]. He's monitored every 3-4 months. WBS have ranged from 28-45,000.  - no current medications  Morbid Obesity - weight loss counseling  Right Inguinal Hernia: Noted on the CT to have some surrounding fluid, though no bowel herniation is seen. Patient does not indicate any pain   FEN/GI: Regular diet, replete K as needed Prophylaxis: Lovenox  Disposition:Home   Subjective:  No issues overnight   Objective: Temp:  [97.9 F (36.6 C)-100 F (37.8 C)] 99.3 F (37.4 C) (05/12 0647) Pulse Rate:  [64-99] 67 (05/12 0714) Resp:  [16-20] 18 (05/12 0647) BP: (128-172)/(67-98) 143/71 (05/12 0714) SpO2:  [90 %-100 %] 99 % (05/12  0647) Weight:  [414 lb (187.8 kg)] 414 lb (187.8 kg) (05/11 1425) Physical Exam: General: Lying in bed, NAD  Cardiovascular: RRR, no murmurs  Respiratory: CTAB, no rhonchi  Skin: cellulitis present - erythematous and warm , remaining within the outline drawn yesterday  Laboratory:  Recent Labs Lab 09/08/16 0916 09/09/16 0615  WBC 48.1* 41.8*  HGB 13.1 12.0*  HCT 41.0 38.2*  PLT 183 148*    Recent Labs Lab 09/08/16 0916 09/09/16 0615  NA 138 137  K 5.1 3.4*  CL 105 104  CO2 25 25  BUN 10 8  CREATININE 0.74 0.63  CALCIUM 8.1* 7.8*  PROT 6.9  --   BILITOT 0.6  --   ALKPHOS 107  --   ALT 27  --   AST 27  --   GLUCOSE 166* 143*    Imaging/Diagnostic Tests: Ct Abdomen Pelvis W Contrast  Result Date: 09/08/2016 CLINICAL DATA:  Right-sided abdominal cellulitis and fevers EXAM: CT ABDOMEN AND PELVIS WITH CONTRAST TECHNIQUE: Multidetector CT imaging of the abdomen and pelvis was performed using the standard protocol following bolus administration of intravenous contrast. CONTRAST:  151mL ISOVUE-300 IOPAMIDOL (ISOVUE-300) INJECTION 61% COMPARISON:  11/27/2015 FINDINGS: Lower chest: Within normal limits. Hepatobiliary: Diffuse fatty infiltration of the liver is noted. The gallbladder is within normal limits in well distended. Pancreas: Unremarkable. No pancreatic ductal dilatation or surrounding inflammatory changes. Spleen: Normal in size without focal abnormality. Adrenals/Urinary Tract: The adrenal glands are within normal limits. The kidneys  demonstrate scattered hypodensities consistent with cysts. A tiny right renal stone is noted best seen on the coronal imaging. No obstructive changes are seen. The bladder is well distended. Stomach/Bowel: No obstructive or inflammatory changes are noted. The appendix is within normal limits. Vascular/Lymphatic: Aortic atherosclerosis. No enlarged abdominal or pelvic lymph nodes. Reproductive: Prostate is unremarkable. Other: There is slight  progression in the right inguinal hernia with some fluid within. No herniation of bowel is noted. Postsurgical changes are noted on the left consistent with prior hernia repair. No recurrent hernia is seen. Some persistent thickening of the skin of the anterior abdominal wall particularly on the right is noted. It is overall improved from the prior exam however and likely represents a recurrent cellulitis. No subcutaneous abscess is seen. 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 Degenerative changes of the lumbar spine are noted. IMPRESSION: Tiny nonobstructing right renal stone. Progression in size of a right inguinal hernia containing some fluid although no bowel herniation is seen. Persistent but improved skin thickening in the anterior abdominal wall consistent with the given clinical history of cellulitis. No focal abscess is seen. Chronic changes as described above. Electronically Signed   By: Inez Catalina M.D.   On: 09/08/2016 11:09    Tonette Bihari, MD 09/09/2016, 8:31 AM PGY-2, [place] Intern pager: [phone], text pages welcome

## 2016-09-10 ENCOUNTER — Encounter: Payer: Self-pay | Admitting: Family Medicine

## 2016-09-10 DIAGNOSIS — L03311 Cellulitis of abdominal wall: Principal | ICD-10-CM

## 2016-09-10 LAB — BASIC METABOLIC PANEL
Anion gap: 6 (ref 5–15)
BUN: 8 mg/dL (ref 6–20)
CHLORIDE: 106 mmol/L (ref 101–111)
CO2: 27 mmol/L (ref 22–32)
Calcium: 7.9 mg/dL — ABNORMAL LOW (ref 8.9–10.3)
Creatinine, Ser: 0.63 mg/dL (ref 0.61–1.24)
GFR calc non Af Amer: >60 mL/min (ref >60)
Glucose, Bld: 137 mg/dL — ABNORMAL HIGH (ref 65–99)
POTASSIUM: 4.2 mmol/L (ref 3.5–5.1)
SODIUM: 139 mmol/L (ref 135–145)

## 2016-09-10 LAB — CBC WITH DIFFERENTIAL/PLATELET
BASOS PCT: 0 %
Basophils Absolute: 0 10*3/uL (ref 0.0–0.1)
EOS ABS: 0.4 10*3/uL (ref 0.0–0.7)
Eosinophils Relative: 1 %
HCT: 36.4 % — ABNORMAL LOW (ref 39.0–52.0)
HEMOGLOBIN: 11.5 g/dL — AB (ref 13.0–17.0)
LYMPHS PCT: 85 %
Lymphs Abs: 30.1 10*3/uL — ABNORMAL HIGH (ref 0.7–4.0)
MCH: 27.6 pg (ref 26.0–34.0)
MCHC: 31.6 g/dL (ref 30.0–36.0)
MCV: 87.3 fL (ref 78.0–100.0)
MONOS PCT: 1 %
Monocytes Absolute: 0.4 10*3/uL (ref 0.1–1.0)
NEUTROS PCT: 13 %
Neutro Abs: 4.6 10*3/uL (ref 1.7–7.7)
Platelets: 127 10*3/uL — ABNORMAL LOW (ref 150–400)
RBC: 4.17 MIL/uL — ABNORMAL LOW (ref 4.22–5.81)
RDW: 16.3 % — ABNORMAL HIGH (ref 11.5–15.5)
WBC: 35.5 10*3/uL — ABNORMAL HIGH (ref 4.0–10.5)

## 2016-09-10 LAB — HEMOGLOBIN A1C
HEMOGLOBIN A1C: 7.4 % — AB (ref 4.8–5.6)
Mean Plasma Glucose: 166 mg/dL

## 2016-09-10 MED ORDER — CEPHALEXIN 500 MG PO CAPS
500.0000 mg | ORAL_CAPSULE | Freq: Four times a day (QID) | ORAL | Status: DC
  Administered 2016-09-10: 500 mg via ORAL
  Filled 2016-09-10: qty 1

## 2016-09-10 MED ORDER — CEPHALEXIN 500 MG PO CAPS
500.0000 mg | ORAL_CAPSULE | Freq: Four times a day (QID) | ORAL | 0 refills | Status: DC
Start: 2016-09-10 — End: 2017-01-22

## 2016-09-10 NOTE — Progress Notes (Signed)
Pt given discharge instructions, prescriptions, and care notes. Pt verbalized understanding AEB no further questions or concerns at this time. IV was discontinued, no redness, pain, or swelling noted at this time. Pt left the floor via wheelchair with staff in stable condition. 

## 2016-09-10 NOTE — Discharge Instructions (Signed)
You were treated with IV antibiotics for a skin infection on your abdomen.  You are being discharged with oral Cephalexin 500mg  to take 4 times daily for the next 10 days.  Follow up with your primary care doctor within the next week for recheck.  If you develop worsening pain, swelling, redness or heat from area, please seek immediate medical attention.   Cellulitis, Adult Cellulitis is a skin infection. The infected area is usually red and tender. This condition occurs most often in the arms and lower legs. The infection can travel to the muscles, blood, and underlying tissue and become serious. It is very important to get treated for this condition. What are the causes? Cellulitis is caused by bacteria. The bacteria enter through a break in the skin, such as a cut, burn, insect bite, open sore, or crack. What increases the risk? This condition is more likely to occur in people who:  Have a weak defense system (immune system).  Have open wounds on the skin such as cuts, burns, bites, and scrapes. Bacteria can enter the body through these open wounds.  Are older.  Have diabetes.  Have a type of long-lasting (chronic) liver disease (cirrhosis) or kidney disease.  Use IV drugs. What are the signs or symptoms? Symptoms of this condition include:  Redness, streaking, or spotting on the skin.  Swollen area of the skin.  Tenderness or pain when an area of the skin is touched.  Warm skin.  Fever.  Chills.  Blisters. How is this diagnosed? This condition is diagnosed based on a medical history and physical exam. You may also have tests, including:  Blood tests.  Lab tests.  Imaging tests. How is this treated? Treatment for this condition may include:  Medicines, such as antibiotic medicines or antihistamines.  Supportive care, such as rest and application of cold or warm cloths (cold or warm compresses) to the skin.  Hospital care, if the condition is severe. The  infection usually gets better within 1-2 days of treatment. Follow these instructions at home:  Take over-the-counter and prescription medicines only as told by your health care provider.  If you were prescribed an antibiotic medicine, take it as told by your health care provider. Do not stop taking the antibiotic even if you start to feel better.  Drink enough fluid to keep your urine clear or pale yellow.  Do not touch or rub the infected area.  Raise (elevate) the infected area above the level of your heart while you are sitting or lying down.  Apply warm or cold compresses to the affected area as told by your health care provider.  Keep all follow-up visits as told by your health care provider. This is important. These visits let your health care provider make sure a more serious infection is not developing. Contact a health care provider if:  You have a fever.  Your symptoms do not improve within 1-2 days of starting treatment.  Your bone or joint underneath the infected area becomes painful after the skin has healed.  Your infection returns in the same area or another area.  You notice a swollen bump in the infected area.  You develop new symptoms.  You have a general ill feeling (malaise) with muscle aches and pains. Get help right away if:  Your symptoms get worse.  You feel very sleepy.  You develop vomiting or diarrhea that persists.  You notice red streaks coming from the infected area.  Your red area gets larger  or turns dark in color. This information is not intended to replace advice given to you by your health care provider. Make sure you discuss any questions you have with your health care provider. Document Released: 01/25/2005 Document Revised: 08/26/2015 Document Reviewed: 02/24/2015 Elsevier Interactive Patient Education  2017 [employer].

## 2016-09-10 NOTE — Progress Notes (Signed)
Family Medicine Teaching Service Daily Progress Note Intern Pager: [phone]  Patient name: Henry Hinton Medical record number: [id number] Date of birth: 24-Mar-1961 Age: 56 y.o. Gender: male  Primary Care Provider: [name], MD Consultants: None  Code Status: Full  Pt Overview and Major Events to Date:   Assessment and Plan: Henry Hinton is a 56 y.o. male presenting with abdominal wall cellulitis . PMH is significant for LE edema (no formal dx heart failure),   # Cellulitis/Panniculitis:  WBC 48>>35.5. Clinically appears to be improving.  Continues to have mild/mod induration at the base of the affected area but erythema improving and tenderness improving per patient.  CT abdomen c/w cellulitis, no focal abscess. HbA1c 6.2 > 7.4 - Vancomycin per pharmacy (5/12 >5/13). - Will plan to switch to PO abx today.  Start Keflex 500mg  QID - Blood cultures NG x1 day. - vitals per unit - monitor cellulitis margins - pain control with ibuprofen and tylenol  # HTN- 125/60 this am.  Endorses taking lasix 40 mg PO daily at home for LE swelling which he has not taken yet today - continue home lasix - monitor BP  # Lower Extremity Edema, chronic - May be c/w lymphedema. Patient carries no formal diagnosis of CHF, and no previous echo. Patient had a myoview in 2015 which was a low-risk study and EF was WNL at that time.  -500cc/24h. +1.5L since admission. - continue home lasix - hold home KDUR, monitor K with daily BMP and replete PRN  # CLL- Followed by Dr. [name] in [facility]. He's monitored every 3-4 months. WBCs have ranged from 28-45,000.  - no current medications  # Morbid Obesity - weight loss counseling  # Right Inguinal Hernia: Noted on the CT to have some surrounding fluid, though no bowel herniation is seen. Patient does not indicate any pain   FEN/GI: Regular diet, replete K as needed Prophylaxis: Lovenox  Disposition: Discharge Home today.  Subjective:   Patient reports that erythema and pain is improving.  He reports he is tolerating PO well.  Denies nausea, vomiting, fevers, chills, abdominal pain.  Objective: Temp:  [98 F (36.7 C)-98.3 F (36.8 C)] 98 F (36.7 C) (05/13 0513) Pulse Rate:  [58-75] 58 (05/13 0513) Resp:  [18-20] 18 (05/13 0513) BP: (125-151)/(60-62) 125/60 (05/13 0513) SpO2:  [97 %-98 %] 97 % (05/13 0513) Physical Exam: General: sitting up at bedside, NAD, RN at bedside Cardiovascular: RRR, 2/6 systolic murmur best heard at LSB.  Respiratory: CTAB, no rhonchi, normal WOB on room air GI: obese, nontender, nondistended, +BS Skin: large area over lower abdomen, erythema appear to be receding.  No purulence, exudate, or bleeding. Slight warmth appreciated, induration appreciated on inferior aspect of pannus.  Otherwise soft and nontender. Ext: LE with +2 pitting edema and chronic venous stasis changes.  No TTP.   Laboratory:  Recent Labs Lab 09/08/16 0916 09/09/16 0615 09/10/16 0713  WBC 48.1* 41.8* 35.5*  HGB 13.1 12.0* 11.5*  HCT 41.0 38.2* 36.4*  PLT 183 148* 127*    Recent Labs Lab 09/08/16 0916 09/09/16 0615 09/10/16 1002  NA 138 137 139  K 5.1 3.4* 4.2  CL 105 104 106  CO2 25 25 27   BUN 10 8 8   CREATININE 0.74 0.63 0.63  CALCIUM 8.1* 7.8* 7.9*  PROT 6.9  --   --   BILITOT 0.6  --   --   ALKPHOS 107  --   --   ALT 27  --   --  AST 27  --   --   GLUCOSE 166* 143* 137*    Imaging/Diagnostic Tests: No results found.  Janora Norlander, DO 09/10/2016, 9:04 AM PGY-3, [place] Intern pager: [phone], text pages welcome

## 2016-09-10 NOTE — Discharge Summary (Signed)
[facility] Discharge Summary  Patient name: Henry Hinton Medical record number: [id number] Date of birth: 1960-08-06 Age: 56 y.o. Gender: male Date of Admission: 09/08/2016  Date of Discharge: 09/10/16 Admitting Physician: Dickie La, MD  Primary Care Provider: [name], MD Consultants: none  Indication for Hospitalization: cellulitis  Discharge Diagnoses/Problem List:  Active Problems:   Cellulitis  Disposition: Discharge home  Discharge Condition: Stable  Discharge Exam:  Blood pressure 125/60, pulse (!) 58, temperature 98 F (36.7 C), temperature source Oral, resp. rate 18, height 5\' 9"  (1.753 m), weight (!) 414 lb (187.8 kg), SpO2 97 %. General: sitting up at bedside, NAD, RN at bedside HEENT: sclera white, MMM Cardiovascular: RRR, 2/6 systolic murmur best heard at LSB.  Respiratory: CTAB, no rhonchi, normal WOB on room air GI: obese, nontender, nondistended, +BS Skin: large area over lower abdomen, erythema appear to be receding.  No purulence, exudate, or bleeding. Slight warmth appreciated, induration appreciated on inferior aspect of pannus.  Otherwise soft and nontender. Ext: LE with +2 pitting edema and chronic venous stasis changes.  No TTP Neuro: no focal deficits, follows commands Psych: mood stable, speech normal, good eye contact  Brief Hospital Course:   Henry Hinton is a 56 y.o. male that was admitted for a rapid appearance of abdominal wall cellulitis--similar to episode last summer.  PMH is significant for LE edema (no formal dx heart failure), CLL w/ known h/o elevated WBCs.  His CT abd showed no underlying process that would predispose to infection.  A small hernia on the right was seen that looked to have slightly progressed.  His WBC was noted to be 48. His vitals were fairly stable on admission.  He was noted to have a low grade fever to 100F and had slightly elevated BP.  Blood cultures were drawn, which were  negative for growth x2 days at time of discharge, and he was started on IV Vancomycin.  He received 2 days of Vanc before being transitioned to PO Keflex, which he was discharged on to continue for 10 additional days.    He was continued on his home medications for other medical issues during hospitalization.  At time of discharge, he was feeling better and his cellulitis clinically was resolving.  He was discharged in stable condition with strict return precautions.  He was advised to follow up with his PCP within the week for skin check.     Issues for Follow Up:  1. Cellulitis/ completion of abx 2. Recommend repeating CBC 3. Follow up final read on Blood cultures. 4. R inguinal hernia, consider surgical referral 5. Weight loss counseling  Significant Procedures: none  Significant Labs and Imaging:   Recent Labs Lab 09/08/16 0916 09/09/16 0615 09/10/16 0713  WBC 48.1* 41.8* 35.5*  HGB 13.1 12.0* 11.5*  HCT 41.0 38.2* 36.4*  PLT 183 148* 127*    Recent Labs Lab 09/08/16 0916 09/09/16 0615 09/10/16 1002  NA 138 137 139  K 5.1 3.4* 4.2  CL 105 104 106  CO2 25 25 27   GLUCOSE 166* 143* 137*  BUN 10 8 8   CREATININE 0.74 0.63 0.63  CALCIUM 8.1* 7.8* 7.9*  ALKPHOS 107  --   --   AST 27  --   --   ALT 27  --   --   ALBUMIN 3.5  --   --     Ct Abdomen Pelvis W Contrast  Result Date: 09/08/2016 CLINICAL DATA:  Right-sided  abdominal cellulitis and fevers EXAM: CT ABDOMEN AND PELVIS WITH CONTRAST TECHNIQUE: Multidetector CT imaging of the abdomen and pelvis was performed using the standard protocol following bolus administration of intravenous contrast. CONTRAST:  160mL ISOVUE-300 IOPAMIDOL (ISOVUE-300) INJECTION 61% COMPARISON:  11/27/2015 FINDINGS: Lower chest: Within normal limits. Hepatobiliary: Diffuse fatty infiltration of the liver is noted. The gallbladder is within normal limits in well distended. Pancreas: Unremarkable. No pancreatic ductal dilatation or surrounding  inflammatory changes. Spleen: Normal in size without focal abnormality. Adrenals/Urinary Tract: The adrenal glands are within normal limits. The kidneys demonstrate scattered hypodensities consistent with cysts. A tiny right renal stone is noted best seen on the coronal imaging. No obstructive changes are seen. The bladder is well distended. Stomach/Bowel: No obstructive or inflammatory changes are noted. The appendix is within normal limits. Vascular/Lymphatic: Aortic atherosclerosis. No enlarged abdominal or pelvic lymph nodes. Reproductive: Prostate is unremarkable. Other: There is slight progression in the right inguinal hernia with some fluid within. No herniation of bowel is noted. Postsurgical changes are noted on the left consistent with prior hernia repair. No recurrent hernia is seen. Some persistent thickening of the skin of the anterior abdominal wall particularly on the right is noted. It is overall improved from the prior exam however and likely represents a recurrent cellulitis. No subcutaneous abscess is seen. 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 Degenerative changes of the lumbar spine are noted. IMPRESSION: Tiny nonobstructing right renal stone. Progression in size of a right inguinal hernia containing some fluid although no bowel herniation is seen. Persistent but improved skin thickening in the anterior abdominal wall consistent with the given clinical history of cellulitis. No focal abscess is seen. Chronic changes as described above. Electronically Signed   By: Inez Catalina M.D.   On: 09/08/2016 11:09   Results/Tests Pending at Time of Discharge: final blood culture read  Discharge Medications:  Allergies as of 09/10/2016      Reactions   Adhesive [tape] Other (See Comments)   Blisters and rash at site      Medication List    TAKE these medications   aspirin EC 81 MG tablet Take 81 mg by mouth daily.   cephALEXin 500 MG capsule Commonly known as:  KEFLEX Take  1 capsule (500 mg total) by mouth 4 (four) times daily. x10 days   furosemide 40 MG tablet Commonly known as:  LASIX TAKE 1 TABLET BY MOUTH EVERY DAY   ibuprofen 200 MG tablet Commonly known as:  ADVIL,MOTRIN Take 800 mg by mouth every 6 (six) hours as needed for moderate pain.   IRON SUPPLEMENT 325 (65 FE) MG tablet Generic drug:  ferrous sulfate Take 325 mg by mouth daily with breakfast.   KLOR-CON 10 10 MEQ tablet Generic drug:  potassium chloride TAKE 1 TABLET BY MOUTH EVERY DAY   multivitamin with minerals tablet Take 1 tablet by mouth daily.   saccharomyces boulardii 250 MG capsule Commonly known as:  FLORASTOR Take 1 capsule (250 mg total) by mouth 2 (two) times daily.       Discharge Instructions: Please refer to Patient Instructions section of EMR for full details.  Patient was counseled important signs and symptoms that should prompt return to medical care, changes in medications, dietary instructions, activity restrictions, and follow up appointments.   Follow-Up Appointments: Follow-up Information    [name], MD. Schedule an appointment as soon as possible for a visit in 1 week(s).   Specialties:  Family Medicine, Sports Medicine Why:  [facility]  follow up Contact information: [place] [address] [phone]           Janora Norlander, DO 09/10/2016, 6:56 PM PGY-3, [place]

## 2016-09-12 DIAGNOSIS — I10 Essential (primary) hypertension: Secondary | ICD-10-CM

## 2016-09-13 LAB — CULTURE, BLOOD (ROUTINE X 2)
CULTURE: NO GROWTH
CULTURE: NO GROWTH
SPECIAL REQUESTS: ADEQUATE
SPECIAL REQUESTS: ADEQUATE

## 2016-09-20 NOTE — Telephone Encounter (Signed)
Please schedule follow up appointment with me in next week.

## 2016-11-08 ENCOUNTER — Encounter: Payer: Self-pay | Admitting: Family Medicine

## 2016-11-16 ENCOUNTER — Other Ambulatory Visit: Payer: Self-pay | Admitting: Family Medicine

## 2016-11-16 DIAGNOSIS — R600 Localized edema: Secondary | ICD-10-CM

## 2016-11-20 ENCOUNTER — Encounter: Payer: Self-pay | Admitting: Family Medicine

## 2016-11-20 DIAGNOSIS — R600 Localized edema: Secondary | ICD-10-CM

## 2016-11-20 NOTE — Telephone Encounter (Signed)
[facility] CALLED TO CHECK THE STATUS OF REFILL REQ./// ADVISED PT IS DUE FOR VISIT.

## 2016-11-24 NOTE — Telephone Encounter (Signed)
See patient email. Oncologist requested nephrology evaluation for assistance in fluid mobilization for his chronic lower extremity edema with stasis changes. He had a normal EF of 65% on echocardiogram in 2015, no proteinuria on most recent urinalysis in May. CMP reviewed from care everywhere with creatinine 0.60 recently and normal albumin. Long-standing lower extremity edema, ultrasound bilateral lower extremities without DVT in December 2017.  Referral placed.

## 2016-12-24 ENCOUNTER — Other Ambulatory Visit: Payer: Self-pay | Admitting: Family Medicine

## 2016-12-24 DIAGNOSIS — R600 Localized edema: Secondary | ICD-10-CM

## 2017-01-21 ENCOUNTER — Encounter (HOSPITAL_COMMUNITY): Payer: Self-pay

## 2017-01-21 ENCOUNTER — Inpatient Hospital Stay (HOSPITAL_COMMUNITY)
Admission: EM | Admit: 2017-01-21 | Discharge: 2017-01-25 | DRG: 872 | Disposition: A | Discharge status: Home / Self Care | Payer: BC Managed Care – PPO | Attending: Internal Medicine | Admitting: Internal Medicine

## 2017-01-21 DIAGNOSIS — Z7982 Long term (current) use of aspirin: Secondary | ICD-10-CM

## 2017-01-21 DIAGNOSIS — Z8249 Family history of ischemic heart disease and other diseases of the circulatory system: Secondary | ICD-10-CM

## 2017-01-21 DIAGNOSIS — A419 Sepsis, unspecified organism: Principal | ICD-10-CM | POA: Diagnosis present

## 2017-01-21 DIAGNOSIS — Z86718 Personal history of other venous thrombosis and embolism: Secondary | ICD-10-CM

## 2017-01-21 DIAGNOSIS — Z87891 Personal history of nicotine dependence: Secondary | ICD-10-CM

## 2017-01-21 DIAGNOSIS — D696 Thrombocytopenia, unspecified: Secondary | ICD-10-CM | POA: Diagnosis present

## 2017-01-21 DIAGNOSIS — G4733 Obstructive sleep apnea (adult) (pediatric): Secondary | ICD-10-CM | POA: Diagnosis present

## 2017-01-21 DIAGNOSIS — Z79899 Other long term (current) drug therapy: Secondary | ICD-10-CM

## 2017-01-21 DIAGNOSIS — T148XXA Other injury of unspecified body region, initial encounter: Secondary | ICD-10-CM | POA: Diagnosis present

## 2017-01-21 DIAGNOSIS — Z96643 Presence of artificial hip joint, bilateral: Secondary | ICD-10-CM | POA: Diagnosis present

## 2017-01-21 DIAGNOSIS — Z87442 Personal history of urinary calculi: Secondary | ICD-10-CM

## 2017-01-21 DIAGNOSIS — L03311 Cellulitis of abdominal wall: Secondary | ICD-10-CM | POA: Diagnosis not present

## 2017-01-21 DIAGNOSIS — E875 Hyperkalemia: Secondary | ICD-10-CM | POA: Diagnosis present

## 2017-01-21 DIAGNOSIS — W57XXXA Bitten or stung by nonvenomous insect and other nonvenomous arthropods, initial encounter: Secondary | ICD-10-CM

## 2017-01-21 DIAGNOSIS — D638 Anemia in other chronic diseases classified elsewhere: Secondary | ICD-10-CM | POA: Diagnosis present

## 2017-01-21 DIAGNOSIS — Z91048 Other nonmedicinal substance allergy status: Secondary | ICD-10-CM

## 2017-01-21 DIAGNOSIS — Z6841 Body Mass Index (BMI) 40.0 and over, adult: Secondary | ICD-10-CM

## 2017-01-21 DIAGNOSIS — I10 Essential (primary) hypertension: Secondary | ICD-10-CM | POA: Diagnosis present

## 2017-01-21 DIAGNOSIS — C9110 Chronic lymphocytic leukemia of B-cell type not having achieved remission: Secondary | ICD-10-CM | POA: Diagnosis present

## 2017-01-21 LAB — CBC WITH DIFFERENTIAL/PLATELET
BASOS PCT: 0 %
Basophils Absolute: 0 10*3/uL (ref 0.0–0.1)
EOS PCT: 0 %
Eosinophils Absolute: 0 10*3/uL (ref 0.0–0.7)
HEMATOCRIT: 41.3 % (ref 39.0–52.0)
Hemoglobin: 12.7 g/dL — ABNORMAL LOW (ref 13.0–17.0)
LYMPHS ABS: 40.8 10*3/uL — AB (ref 0.7–4.0)
Lymphocytes Relative: 77 %
MCH: 27 pg (ref 26.0–34.0)
MCHC: 30.8 g/dL (ref 30.0–36.0)
MCV: 87.9 fL (ref 78.0–100.0)
MONO ABS: 0.5 10*3/uL (ref 0.1–1.0)
Monocytes Relative: 1 %
NEUTROS ABS: 11.7 10*3/uL — AB (ref 1.7–7.7)
Neutrophils Relative %: 22 %
PLATELETS: 178 10*3/uL (ref 150–400)
RBC: 4.7 MIL/uL (ref 4.22–5.81)
RDW: 15.8 % — AB (ref 11.5–15.5)
WBC: 53 10*3/uL — AB (ref 4.0–10.5)

## 2017-01-21 LAB — COMPREHENSIVE METABOLIC PANEL
ALBUMIN: 3.9 g/dL (ref 3.5–5.0)
ALT: 26 U/L (ref 17–63)
AST: 28 U/L (ref 15–41)
Alkaline Phosphatase: 108 U/L (ref 38–126)
Anion gap: 10 (ref 5–15)
BUN: 20 mg/dL (ref 6–20)
CHLORIDE: 101 mmol/L (ref 101–111)
CO2: 25 mmol/L (ref 22–32)
CREATININE: 1 mg/dL (ref 0.61–1.24)
Calcium: 8.6 mg/dL — ABNORMAL LOW (ref 8.9–10.3)
GFR calc Af Amer: >60 mL/min (ref >60)
Glucose, Bld: 153 mg/dL — ABNORMAL HIGH (ref 65–99)
Potassium: 5.2 mmol/L — ABNORMAL HIGH (ref 3.5–5.1)
Sodium: 136 mmol/L (ref 135–145)
Total Bilirubin: 0.3 mg/dL (ref 0.3–1.2)
Total Protein: 7.2 g/dL (ref 6.5–8.1)

## 2017-01-21 LAB — URINALYSIS, ROUTINE W REFLEX MICROSCOPIC
Bacteria, UA: NONE SEEN
Bilirubin Urine: NEGATIVE
Glucose, UA: NEGATIVE mg/dL
Ketones, ur: NEGATIVE mg/dL
Leukocytes, UA: NEGATIVE
Nitrite: NEGATIVE
PH: 5 (ref 5.0–8.0)
Protein, ur: NEGATIVE mg/dL
SPECIFIC GRAVITY, URINE: 1.016 (ref 1.005–1.030)
SQUAMOUS EPITHELIAL / LPF: NONE SEEN

## 2017-01-21 LAB — I-STAT CG4 LACTIC ACID, ED: LACTIC ACID, VENOUS: 1.46 mmol/L (ref 0.5–1.9)

## 2017-01-21 MED ORDER — ACETAMINOPHEN 325 MG PO TABS
650.0000 mg | ORAL_TABLET | Freq: Once | ORAL | Status: AC
  Administered 2017-01-22: 650 mg via ORAL
  Filled 2017-01-21: qty 2

## 2017-01-21 MED ORDER — FENTANYL CITRATE (PF) 100 MCG/2ML IJ SOLN
50.0000 ug | Freq: Once | INTRAMUSCULAR | Status: AC
  Administered 2017-01-22: 50 ug via INTRAVENOUS
  Filled 2017-01-21: qty 2

## 2017-01-21 MED ORDER — SODIUM CHLORIDE 0.9 % IV SOLN
1500.0000 mg | Freq: Once | INTRAVENOUS | Status: AC
  Administered 2017-01-22: 1500 mg via INTRAVENOUS
  Filled 2017-01-21: qty 1500

## 2017-01-21 NOTE — ED Triage Notes (Signed)
Onset 2 nights ago unknown insect bite to left medial upper leg and right arm.  Onset this afternoon lower abd reddened and "feels like on fire".  Pt reports fever today.

## 2017-01-21 NOTE — ED Notes (Signed)
Lab called:  WBC 53.0.  Called and advised Dr. Ellender Hose.

## 2017-01-22 ENCOUNTER — Encounter (HOSPITAL_COMMUNITY): Payer: Self-pay | Admitting: General Practice

## 2017-01-22 DIAGNOSIS — E875 Hyperkalemia: Secondary | ICD-10-CM | POA: Diagnosis present

## 2017-01-22 DIAGNOSIS — D638 Anemia in other chronic diseases classified elsewhere: Secondary | ICD-10-CM | POA: Diagnosis present

## 2017-01-22 DIAGNOSIS — W57XXXA Bitten or stung by nonvenomous insect and other nonvenomous arthropods, initial encounter: Secondary | ICD-10-CM

## 2017-01-22 DIAGNOSIS — Z87891 Personal history of nicotine dependence: Secondary | ICD-10-CM | POA: Diagnosis not present

## 2017-01-22 DIAGNOSIS — Z8249 Family history of ischemic heart disease and other diseases of the circulatory system: Secondary | ICD-10-CM | POA: Diagnosis not present

## 2017-01-22 DIAGNOSIS — G4733 Obstructive sleep apnea (adult) (pediatric): Secondary | ICD-10-CM | POA: Diagnosis present

## 2017-01-22 DIAGNOSIS — C9110 Chronic lymphocytic leukemia of B-cell type not having achieved remission: Secondary | ICD-10-CM | POA: Diagnosis present

## 2017-01-22 DIAGNOSIS — L03311 Cellulitis of abdominal wall: Secondary | ICD-10-CM | POA: Diagnosis present

## 2017-01-22 DIAGNOSIS — T148XXA Other injury of unspecified body region, initial encounter: Secondary | ICD-10-CM | POA: Diagnosis present

## 2017-01-22 DIAGNOSIS — D696 Thrombocytopenia, unspecified: Secondary | ICD-10-CM | POA: Diagnosis present

## 2017-01-22 DIAGNOSIS — Z96643 Presence of artificial hip joint, bilateral: Secondary | ICD-10-CM | POA: Diagnosis present

## 2017-01-22 DIAGNOSIS — A419 Sepsis, unspecified organism: Secondary | ICD-10-CM | POA: Diagnosis present

## 2017-01-22 DIAGNOSIS — C9190 Lymphoid leukemia, unspecified not having achieved remission: Secondary | ICD-10-CM | POA: Diagnosis not present

## 2017-01-22 DIAGNOSIS — Z86718 Personal history of other venous thrombosis and embolism: Secondary | ICD-10-CM | POA: Diagnosis not present

## 2017-01-22 DIAGNOSIS — I10 Essential (primary) hypertension: Secondary | ICD-10-CM | POA: Diagnosis present

## 2017-01-22 DIAGNOSIS — Z7982 Long term (current) use of aspirin: Secondary | ICD-10-CM | POA: Diagnosis not present

## 2017-01-22 DIAGNOSIS — Z79899 Other long term (current) drug therapy: Secondary | ICD-10-CM | POA: Diagnosis not present

## 2017-01-22 DIAGNOSIS — Z6841 Body Mass Index (BMI) 40.0 and over, adult: Secondary | ICD-10-CM | POA: Diagnosis not present

## 2017-01-22 DIAGNOSIS — Z87442 Personal history of urinary calculi: Secondary | ICD-10-CM | POA: Diagnosis not present

## 2017-01-22 DIAGNOSIS — Z91048 Other nonmedicinal substance allergy status: Secondary | ICD-10-CM | POA: Diagnosis not present

## 2017-01-22 LAB — BASIC METABOLIC PANEL
Anion gap: 9 (ref 5–15)
BUN: 16 mg/dL (ref 6–20)
CALCIUM: 7.9 mg/dL — AB (ref 8.9–10.3)
CO2: 22 mmol/L (ref 22–32)
CREATININE: 0.78 mg/dL (ref 0.61–1.24)
Chloride: 105 mmol/L (ref 101–111)
Glucose, Bld: 174 mg/dL — ABNORMAL HIGH (ref 65–99)
Potassium: 4.9 mmol/L (ref 3.5–5.1)
SODIUM: 136 mmol/L (ref 135–145)

## 2017-01-22 LAB — CBC
HCT: 37.3 % — ABNORMAL LOW (ref 39.0–52.0)
Hemoglobin: 11.7 g/dL — ABNORMAL LOW (ref 13.0–17.0)
MCH: 27.1 pg (ref 26.0–34.0)
MCHC: 31.4 g/dL (ref 30.0–36.0)
MCV: 86.3 fL (ref 78.0–100.0)
PLATELETS: 161 10*3/uL (ref 150–400)
RBC: 4.32 MIL/uL (ref 4.22–5.81)
RDW: 15.7 % — ABNORMAL HIGH (ref 11.5–15.5)
WBC: 48.3 10*3/uL — ABNORMAL HIGH (ref 4.0–10.5)

## 2017-01-22 LAB — SEDIMENTATION RATE: Sed Rate: 11 mm/hr (ref 0–16)

## 2017-01-22 LAB — PROCALCITONIN: PROCALCITONIN: 0.12 ng/mL

## 2017-01-22 LAB — LACTIC ACID, PLASMA: Lactic Acid, Venous: 1.3 mmol/L (ref 0.5–1.9)

## 2017-01-22 LAB — PATHOLOGIST SMEAR REVIEW

## 2017-01-22 LAB — C-REACTIVE PROTEIN: CRP: 2.3 mg/dL — ABNORMAL HIGH (ref <1.0)

## 2017-01-22 LAB — HIV ANTIBODY (ROUTINE TESTING W REFLEX): HIV Screen 4th Generation wRfx: NONREACTIVE

## 2017-01-22 MED ORDER — SODIUM POLYSTYRENE SULFONATE 15 GM/60ML PO SUSP
15.0000 g | Freq: Once | ORAL | Status: AC
  Administered 2017-01-22: 15 g via ORAL
  Filled 2017-01-22: qty 60

## 2017-01-22 MED ORDER — ADULT MULTIVITAMIN W/MINERALS CH
1.0000 | ORAL_TABLET | Freq: Every day | ORAL | Status: DC
  Administered 2017-01-22 – 2017-01-25 (×4): 1 via ORAL
  Filled 2017-01-22 (×4): qty 1

## 2017-01-22 MED ORDER — SODIUM CHLORIDE 0.9 % IV BOLUS (SEPSIS)
3000.0000 mL | Freq: Once | INTRAVENOUS | Status: AC
  Administered 2017-01-22: 3000 mL via INTRAVENOUS

## 2017-01-22 MED ORDER — HYDRALAZINE HCL 20 MG/ML IJ SOLN: 5.0000 mg | INTRAMUSCULAR | Status: DC | PRN

## 2017-01-22 MED ORDER — ONDANSETRON HCL 4 MG/2ML IJ SOLN: 4.0000 mg | Freq: Four times a day (QID) | INTRAMUSCULAR | Status: DC | PRN

## 2017-01-22 MED ORDER — VANCOMYCIN HCL 10 G IV SOLR
1250.0000 mg | Freq: Two times a day (BID) | INTRAVENOUS | Status: DC
  Administered 2017-01-22 – 2017-01-24 (×5): 1250 mg via INTRAVENOUS
  Filled 2017-01-22 (×7): qty 1250

## 2017-01-22 MED ORDER — ENOXAPARIN SODIUM 100 MG/ML ~~LOC~~ SOLN
90.0000 mg | SUBCUTANEOUS | Status: DC
  Administered 2017-01-22 – 2017-01-24 (×3): 90 mg via SUBCUTANEOUS
  Filled 2017-01-22 (×4): qty 1

## 2017-01-22 MED ORDER — ZOLPIDEM TARTRATE 5 MG PO TABS: 5.0000 mg | ORAL_TABLET | Freq: Every evening | ORAL | Status: DC | PRN

## 2017-01-22 MED ORDER — DOXYCYCLINE HYCLATE 100 MG IV SOLR
200.0000 mg | Freq: Once | INTRAVENOUS | Status: AC
  Administered 2017-01-22: 200 mg via INTRAVENOUS
  Filled 2017-01-22: qty 200

## 2017-01-22 MED ORDER — OXYCODONE-ACETAMINOPHEN 5-325 MG PO TABS: 2.0000 | ORAL_TABLET | ORAL | Status: DC | PRN

## 2017-01-22 MED ORDER — SACCHAROMYCES BOULARDII 250 MG PO CAPS: 250.0000 mg | ORAL_CAPSULE | Freq: Two times a day (BID) | ORAL | Status: DC

## 2017-01-22 MED ORDER — FERROUS SULFATE 325 (65 FE) MG PO TABS
325.0000 mg | ORAL_TABLET | Freq: Every day | ORAL | Status: DC
  Administered 2017-01-22 – 2017-01-25 (×4): 325 mg via ORAL
  Filled 2017-01-22 (×4): qty 1

## 2017-01-22 MED ORDER — ONDANSETRON HCL 4 MG PO TABS: 4.0000 mg | ORAL_TABLET | Freq: Four times a day (QID) | ORAL | Status: DC | PRN

## 2017-01-22 MED ORDER — HYDROMORPHONE HCL 1 MG/ML IJ SOLN: 1.0000 mg | INTRAMUSCULAR | Status: DC | PRN

## 2017-01-22 MED ORDER — ACETAMINOPHEN 650 MG RE SUPP: 650.0000 mg | Freq: Four times a day (QID) | RECTAL | Status: DC | PRN

## 2017-01-22 MED ORDER — ACETAMINOPHEN 325 MG PO TABS
650.0000 mg | ORAL_TABLET | Freq: Four times a day (QID) | ORAL | Status: DC | PRN
  Administered 2017-01-23 – 2017-01-24 (×2): 650 mg via ORAL
  Filled 2017-01-22 (×2): qty 2

## 2017-01-22 MED ORDER — ASPIRIN EC 81 MG PO TBEC
81.0000 mg | DELAYED_RELEASE_TABLET | Freq: Every day | ORAL | Status: DC
  Administered 2017-01-22 – 2017-01-25 (×4): 81 mg via ORAL
  Filled 2017-01-22 (×4): qty 1

## 2017-01-22 MED ORDER — SODIUM CHLORIDE 0.9 % IV SOLN
INTRAVENOUS | Status: DC
  Administered 2017-01-23: 05:00:00 via INTRAVENOUS

## 2017-01-22 NOTE — Progress Notes (Signed)
Patient seen and examined. Admitted after midnight secondary to abd cellulitis. Patient had hx of CLL. No CP, no SOB. Pain ins abd skin where cellulitis process is appreciated. On my examination reported to feel somewhat better, no nausea, no CP, no SOB. Patient is currently afebrile. Cultures taken and patient started on vancomycin. Please refer to H&P written by Dr. Blaine Hamper for further info/details on admissions.  Plan: -will continue IV antibiotics -will apply cold compresses -continue supportive care and IVF's -continue PRN analgesics  Barton Dubois MD [phone]

## 2017-01-22 NOTE — ED Notes (Signed)
Dr Madera at bedside  

## 2017-01-22 NOTE — ED Provider Notes (Signed)
Henry Hinton   CSN: [id number] Arrival date & time: 01/21/17  1950     History   Chief Complaint Chief Complaint  Patient presents with  . Insect Bite  . Fever  . Cellulitis    HPI Henry Hinton is a 55 y.o. male.  The history is provided by the patient.  Fever   This is a new problem. The current episode started less than 1 hour ago. The problem occurs constantly. The problem has been gradually worsening. Associated symptoms include headaches. Pertinent negatives include no chest pain, no diarrhea and no vomiting. He has tried nothing for the symptoms.  Patient reports he had "bug bite" to his right UE and left thigh yesterday with mild itching About 4hrs ago he had abrupt onset of abdominal pain/redness/rash He also has fever and chills This is similar to prior episodes of cellulitis He has h/o CLL, but no longer on chemo at this time   Past Medical History:  Diagnosis Date  . Anemia   . Arthritis   . BMI 50.0-59.9, adult ([place])   . Cellulitis 11/2015   trunk  . cll 11/2014   CLL - 11/30/2014- Dr. [facility] [place], [place]  . DVT (deep venous thrombosis) ([place]) 6/16   Rt calf  took xarelto for 6 months  . Family history of anesthesia complication    " father having delirium after anesthesia"  . Heart murmur    Hx; of as a child  . History of kidney stones    x2  . Joint pain    Hx: of periodically  . Numbness and tingling    Hx: of in right arm  . Pneumonia   . Sleep apnea    Cpap use- settings 10    Patient Active Problem List   Diagnosis Date Noted  . Essential hypertension   . Hoarseness 06/08/2016  . Cellulitis 11/28/2015  . Cellulitis of trunk 11/27/2015  . Nonspecific abnormal finding in stool contents   . Colon cancer screening   . Venous stasis   . History of DVT (deep vein thrombosis)   . Sepsis ([place]) 10/09/2015  . Panniculitis 10/09/2015  . CLL (chronic lymphocytic leukemia) ([place]) 05/27/2015  . Calculi,  ureter 10/27/2014  . Leukocytosis (leucocytosis) 08/29/2014  . HNP (herniated nucleus pulposus), cervical 01/21/2013  . bilat hip replacement 04/08/2012  . Morbid obesity ([place]) 04/08/2012  . Edema 04/08/2012    Past Surgical History:  Procedure Laterality Date  . COLONOSCOPY     Hx: of  . COLONOSCOPY N/A 11/23/2015   Procedure: COLONOSCOPY;  Surgeon: Irene Shipper, MD;  [facility];  Service: Endoscopy;  Laterality: N/A;  . INGUINAL HERNIA REPAIR Left 06/01/2015   Procedure: LEFT INGUINAL HERNIA REPAIR WITH MESH;  Surgeon: Autumn Messing III, MD;  [facility];  Service: General;  Laterality: Left;  . INSERTION OF MESH Left 06/01/2015   Procedure: INSERTION OF MESH;  Surgeon: Autumn Messing III, MD;  Location: [facility];  Service: General;  Laterality: Left;  . JOINT REPLACEMENT Bilateral    BTHA  . KNEE SURGERY Left 07/21/2014   Dr. Noemi Chapel -scope  . LUMBAR LAMINECTOMY     Hx: of 2005  . POSTERIOR CERVICAL LAMINECTOMY WITH MET- RX Right 01/21/2013   Procedure: Right Sitting C6-7 Microdiskectomy with Met-rex;  Surgeon: Kristeen Miss, MD;  Location: [facility];  Service: Neurosurgery;  Laterality: Right;  Right Sitting C6-7 Microdiskectomy with Met-rex  . SPINE SURGERY    .  STENT PLACEMENT RT URETER ([place] HX)  10/23/2014  . tenosynovitis     Hx: of thumb  . TONSILLECTOMY    . TOTAL HIP ARTHROPLASTY     Hx: of B/L hips  . WISDOM TOOTH EXTRACTION  1974   all 4       Home Medications    Prior to Admission medications   Medication Sig Start Date End Date Taking? Authorizing Provider  aspirin EC 81 MG tablet Take 81 mg by mouth daily.    [provider]  cephALEXin (KEFLEX) 500 MG capsule Take 1 capsule (500 mg total) by mouth 4 (four) times daily. x10 days 09/10/16   Ronnie Doss M, DO  ferrous sulfate (IRON SUPPLEMENT) 325 (65 FE) MG tablet Take 325 mg by mouth daily with breakfast.    [provider]  furosemide (LASIX) 40 MG tablet Take 1 tablet (40 mg  total) by mouth daily. Office visit needed for refills 11/20/16   [name], MD  ibuprofen (ADVIL,MOTRIN) 200 MG tablet Take 800 mg by mouth every 6 (six) hours as needed for moderate pain.     [provider]  Multiple Vitamins-Minerals (MULTIVITAMIN WITH MINERALS) tablet Take 1 tablet by mouth daily.    [provider]  potassium chloride (KLOR-CON 10) 10 MEQ tablet Take 1 tablet (10 mEq total) by mouth daily. Office visit needed for 90 day supply 11/20/16   [name], MD  saccharomyces boulardii (FLORASTOR) 250 MG capsule Take 1 capsule (250 mg total) by mouth 2 (two) times daily. 11/29/15   Janece Canterbury, MD    Family History Family History  Problem Relation Age of Onset  . COPD Maternal Grandmother   . Heart disease Maternal Grandfather   . Lung cancer Paternal Grandfather        LUNG  . Diabetes Father   . Prostate cancer Father        PROSTATE    Social History Social History  Substance Use Topics  . Smoking status: Never Smoker  . Smokeless tobacco: Former Systems developer    Types: Chew    Quit date: 05/01/1993  . Alcohol use 0.6 oz/week    1 Standard drinks or equivalent per week     Comment: BEER AND LIQUOR     Allergies   Adhesive [tape]   Review of Systems Review of Systems  Constitutional: Positive for fatigue and fever.  Cardiovascular: Negative for chest pain.  Gastrointestinal: Negative for diarrhea and vomiting.  Neurological: Positive for headaches.  All other systems reviewed and are negative.    Physical Exam Updated Vital Signs BP (!) 158/74   Pulse 93   Temp (!) 100.9 F (38.3 C) (Oral)   Resp 20   Ht 1.753 m ('5\' 9"' )   Wt (!) 183.3 kg (404 lb)   SpO2 100%   BMI 59.66 kg/m   Physical Exam CONSTITUTIONAL: Well developed/well nourished HEAD: Normocephalic/atraumatic EYES: EOMI/PERRL ENMT: Mucous membranes moist NECK: supple no meningeal signs SPINE/BACK:entire spine nontender CV: S1/S2 noted, no  murmurs/rubs/gallops noted LUNGS: Lungs are clear to auscultation bilaterally, no apparent distress ABDOMEN: soft, mild tenderness surrounding cellulitis, no crepitus, no hernia noted, no rebound or guarding, bowel sounds noted throughout abdomen GU:no cva tenderness NEURO: Pt is awake/alert/appropriate, moves all extremitiesx4.  No facial droop.   EXTREMITIES: pulses normal/equal, full ROM SKIN: warm, color normal, scattered erythematous papules to right UE and left thigh PSYCH: no abnormalities of mood noted, alert and oriented to situation    Patient  gave verbal permission to utilize photo for medical documentation only The image was not stored on any personal device   ED Treatments / Results  Labs (all labs ordered are listed, but only abnormal results are displayed) Labs Reviewed  COMPREHENSIVE METABOLIC PANEL - Abnormal; Notable for the following:       Result Value   Potassium 5.2 (*)    Glucose, Bld 153 (*)    Calcium 8.6 (*)    All other components within normal limits  CBC WITH DIFFERENTIAL/PLATELET - Abnormal; Notable for the following:    WBC 53.0 (*)    Hemoglobin 12.7 (*)    RDW 15.8 (*)    Neutro Abs 11.7 (*)    Lymphs Abs 40.8 (*)    All other components within normal limits  URINALYSIS, ROUTINE W REFLEX MICROSCOPIC - Abnormal; Notable for the following:    Hgb urine dipstick SMALL (*)    All other components within normal limits  CULTURE, BLOOD (ROUTINE X 2)  CULTURE, BLOOD (ROUTINE X 2)  I-STAT CG4 LACTIC ACID, ED  I-STAT CG4 LACTIC ACID, ED    EKG  EKG Interpretation None       Radiology No results found.  Procedures Procedures (including critical care time)  Medications Ordered in ED Medications  vancomycin (VANCOCIN) 1,500 mg in sodium chloride 0.9 % 500 mL IVPB (1,500 mg Intravenous New Bag/Given 01/22/17 0025)  acetaminophen (TYLENOL) tablet 650 mg (650 mg Oral Given 01/22/17 0002)  fentaNYL (SUBLIMAZE) injection 50 mcg (50 mcg  Intravenous Given 01/22/17 0002)     Initial Impression / Assessment and Plan / ED Course  I have reviewed the triage vital signs and the nursing notes.  Pertinent labs results that were available during my care of the patient were reviewed by me and considered in my medical decision making (see chart for details).     Pt with h/o CLL who presents with recurrent episode of cellulitis to abdominal wall No crepitus noted Mild tenderness only, does not appear to have underlying strangulated hernia  12:39 AM D/w Dr Blaine Hamper for admission Pt stable at this time  Final Clinical Impressions(s) / ED Diagnoses   Final diagnoses:  Cellulitis of abdominal wall    New Prescriptions New Prescriptions   No medications on file     Ripley Fraise, MD 01/22/17 [phone]

## 2017-01-22 NOTE — Progress Notes (Addendum)
Pharmacy Antibiotic Note  Henry Hinton is a 56 y.o. male admitted on 01/21/2017 with abdominal wall cellulitis. Pharmacy has been consulted for vancomycin dosing.  Temp 100.9 and WBC 53 (also with hx CLL - WBC chronically elevated 20-40K). LA 1.46. SCr elevated at 1 (BL ~ 0.6) for estimated nCrCl ~ 80-85 mL/min.   Patient received doxycyline IV x1 in ED.   Noted patient with previously therapeutic VT on 1500mg  IV q12hr in 2017 with Scr = 0.64.   Plan: Vancomycin 1.5g IV x1 given in ED, then vancomycin 1250 mg IV q12hr Vancomycin trough at SS and prn (goal 10-15 mcg/mL) Monitor renal function, clinical picture, and culture data F/u length of therapy   Height: 5\' 9"  (175.3 cm) Weight: (!) 404 lb (183.3 kg) IBW/kg (Calculated) : 70.7  Temp (24hrs), Avg:100.2 F (37.9 C), Min:99.4 F (37.4 C), Max:100.9 F (38.3 C)   Recent Labs Lab 01/21/17 2208 01/21/17 2222  WBC 53.0*  --   CREATININE 1.00  --   LATICACIDVEN  --  1.46    Estimated Creatinine Clearance: 135 mL/min (by C-G formula based on SCr of 1 mg/dL).    Allergies  Allergen Reactions  . Adhesive [Tape] Other (See Comments)    Blisters and rash at site    Antimicrobials this admission: 9/24 doxy x1 9/24 Vanc >>   Microbiology results: pending   Argie Ramming, PharmD Clinical Pharmacist 01/22/17 1:19 AM

## 2017-01-22 NOTE — Progress Notes (Signed)
Patient has CPAP from home. Refused at this time.

## 2017-01-22 NOTE — H&P (Signed)
History and Physical    Henry Hinton NWG:[id number] DOB: 09/02/1960 DOA: 01/21/2017  Referring MD/NP/PA:   PCP: [name], MD   Patient coming from:  The patient is coming from home.  At baseline, pt is independent for most of ADL.   Chief Complaint: Abdominal wall tenderness and redness, fever and chills, insect bit  HPI: Henry Hinton is a 56 y.o. male with medical history significant of morbid obesity, abdominal wall cellulitis, hypertension, CLL not on chemotherapy, OSA on CPAP, who presents with abdominal tenderness and redness, fever and chills and insect bit.  Pt states that he started having abrupt onset of abdominal wall pain and redness this afternoon. The patient is constant, sharp, 9 out of 10 in safety, nonradiating. He also has fever and chills. Denies nausea, vomiting, diarrhea or abdominal pain. No chest pain, shortness breath, cough, symptoms of UTI or unilateral weakness. Patient also reports that he had "bug bite" to his right UE and left thigh yesterday with mild itching  ED Course: pt was found to have WBC 53, potassium 5.2, creatinine normal, negative urinalysis, lactic acid 1.46, temperature 100.9, tachycardia, tachypnea, oxygen saturation 100% on room air. Patient is admitted to [facility] bed as inpatient.  Review of Systems:   General: has fevers, chills, no body weight gain, has poor appetite, has fatigue HEENT: no blurry vision, hearing changes or sore throat Respiratory: no dyspnea, coughing, wheezing CV: no chest pain, no palpitations GI: no nausea, vomiting, abdominal pain, diarrhea, constipation GU: no dysuria, burning on urination, increased urinary frequency, hematuria  Ext: no leg edema Neuro: no unilateral weakness, numbness, or tingling, no vision change or hearing loss Skin: has left lower quadrant abdominal wall tenderness and redness MSK: No muscle spasm, no deformity, no limitation of range of movement in spin Heme: No easy bruising.    Travel history: No recent long distant travel.  Allergy:  Allergies  Allergen Reactions  . Adhesive [Tape] Other (See Comments)    Blisters and rash at site    Past Medical History:  Diagnosis Date  . Anemia   . Arthritis   . BMI 50.0-59.9, adult ([place])   . Cellulitis 11/2015   trunk  . cll 11/2014   CLL - 11/30/2014- [facility]. [facility] [place], [place]  . DVT (deep venous thrombosis) ([place]) 6/16   Rt calf  took xarelto for 6 months  . Family history of anesthesia complication    " father having delirium after anesthesia"  . Heart murmur    Hx; of as a child  . History of kidney stones    x2  . Joint pain    Hx: of periodically  . Numbness and tingling    Hx: of in right arm  . Pneumonia   . Sleep apnea    Cpap use- settings 10    Past Surgical History:  Procedure Laterality Date  . COLONOSCOPY     Hx: of  . COLONOSCOPY N/A 11/23/2015   Procedure: COLONOSCOPY;  Surgeon: Irene Shipper, MD;  [facility];  Service: Endoscopy;  Laterality: N/A;  . INGUINAL HERNIA REPAIR Left 06/01/2015   Procedure: LEFT INGUINAL HERNIA REPAIR WITH MESH;  Surgeon: Autumn Messing III, MD;  [facility];  Service: General;  Laterality: Left;  . INSERTION OF MESH Left 06/01/2015   Procedure: INSERTION OF MESH;  Surgeon: Autumn Messing III, MD;  [facility];  Service: General;  Laterality: Left;  . JOINT REPLACEMENT Bilateral    BTHA  .  KNEE SURGERY Left 07/21/2014   Dr. Noemi Chapel -scope  . LUMBAR LAMINECTOMY     Hx: of 2005  . POSTERIOR CERVICAL LAMINECTOMY WITH MET- RX Right 01/21/2013   Procedure: Right Sitting C6-7 Microdiskectomy with Met-rex;  Surgeon: Kristeen Miss, MD;  Location: [facility];  Service: Neurosurgery;  Laterality: Right;  Right Sitting C6-7 Microdiskectomy with Met-rex  . SPINE SURGERY    . STENT PLACEMENT RT URETER ([place] [facility]X)  10/23/2014  . tenosynovitis     Hx: of thumb  . TONSILLECTOMY    . TOTAL HIP ARTHROPLASTY     Hx: of B/L hips  . WISDOM TOOTH  EXTRACTION  1974   all 4    Social History:  reports that he has never smoked. He quit smokeless tobacco use about 23 years ago. His smokeless tobacco use included Chew. He reports that he drinks about 0.6 oz of alcohol per week . He reports that he does not use drugs.  Family History:  Family History  Problem Relation Age of Onset  . COPD Maternal Grandmother   . Heart disease Maternal Grandfather   . Lung cancer Paternal Grandfather        LUNG  . Diabetes Father   . Prostate cancer Father        PROSTATE     Prior to Admission medications   Medication Sig Start Date End Date Taking? Authorizing Provider  aspirin EC 81 MG tablet Take 81 mg by mouth daily.    [provider]  cephALEXin (KEFLEX) 500 MG capsule Take 1 capsule (500 mg total) by mouth 4 (four) times daily. x10 days 09/10/16   Ronnie Doss M, DO  ferrous sulfate (IRON SUPPLEMENT) 325 (65 FE) MG tablet Take 325 mg by mouth daily with breakfast.    [provider]  furosemide (LASIX) 40 MG tablet Take 1 tablet (40 mg total) by mouth daily. Office visit needed for refills 11/20/16   [name], MD  ibuprofen (ADVIL,MOTRIN) 200 MG tablet Take 800 mg by mouth every 6 (six) hours as needed for moderate pain.     [provider]  Multiple Vitamins-Minerals (MULTIVITAMIN WITH MINERALS) tablet Take 1 tablet by mouth daily.    [provider]  potassium chloride (KLOR-CON 10) 10 MEQ tablet Take 1 tablet (10 mEq total) by mouth daily. Office visit needed for 90 day supply 11/20/16   [name], MD  saccharomyces boulardii (FLORASTOR) 250 MG capsule Take 1 capsule (250 mg total) by mouth 2 (two) times daily. 11/29/15   Janece Canterbury, MD    Physical Exam: Vitals:   01/21/17 1954 01/21/17 2220  BP: (!) 155/109 (!) 158/74  Pulse: (!) 103 93  Resp: 20 20  Temp: 99.4 F (37.4 C) (!) 100.9 F (38.3 C)  TempSrc: Oral Oral  SpO2: 100% 100%  Weight: (!) 183.3 kg (404 lb)     Height: '5\' 9"'  (1.753 m)    General: Not in acute distress HEENT:       Eyes: PERRL, EOMI, no scleral icterus.       ENT: No discharge from the ears and nose, no pharynx injection, no tonsillar enlargement.        Neck: No JVD, no bruit, no mass felt. Heme: No neck lymph node enlargement. Cardiac: S1/S2, RRR, No murmurs, No gallops or rubs. Respiratory: Good air movement bilaterally. No rales, wheezing, rhonchi or rubs. GI: Soft, nondistended, nontender, no rebound pain, no organomegaly, BS present. GU: No hematuria Ext: No  pitting leg edema bilaterally. 2+DP/PT pulse bilaterally. Musculoskeletal: No joint deformities, No joint redness or warmth, no limitation of ROM in spin. Skin: has left lower quadrant abdominal wall tenderness, warmth and the redness. There in one insect bit mark in left medial thigh and 3 marks in right upper arm Neuro: Alert, oriented X3, cranial nerves II-XII grossly intact, moves all extremities normally.  Psych: Patient is not psychotic, no suicidal or hemocidal ideation.  Labs on Admission: I have personally reviewed following labs and imaging studies  CBC:  Recent Labs Lab 01/21/17 2208  WBC 53.0*  NEUTROABS 11.7*  HGB 12.7*  HCT 41.3  MCV 87.9  PLT 229   Basic Metabolic Panel:  Recent Labs Lab 01/21/17 2208  NA 136  K 5.2*  CL 101  CO2 25  GLUCOSE 153*  BUN 20  CREATININE 1.00  CALCIUM 8.6*   GFR: Estimated Creatinine Clearance: 135 mL/min (by C-G formula based on SCr of 1 mg/dL). Liver Function Tests:  Recent Labs Lab 01/21/17 2208  AST 28  ALT 26  ALKPHOS 108  BILITOT 0.3  PROT 7.2  ALBUMIN 3.9   No results for input(s): LIPASE, AMYLASE in the last 168 hours. No results for input(s): AMMONIA in the last 168 hours. Coagulation Profile: No results for input(s): INR, PROTIME in the last 168 hours. Cardiac Enzymes: No results for input(s): CKTOTAL, CKMB, CKMBINDEX, TROPONINI in the last 168 hours. BNP (last 3 results) No  results for input(s): PROBNP in the last 8760 hours. HbA1C: No results for input(s): HGBA1C in the last 72 hours. CBG: No results for input(s): GLUCAP in the last 168 hours. Lipid Profile: No results for input(s): CHOL, HDL, LDLCALC, TRIG, CHOLHDL, LDLDIRECT in the last 72 hours. Thyroid Function Tests: No results for input(s): TSH, T4TOTAL, FREET4, T3FREE, THYROIDAB in the last 72 hours. Anemia Panel: No results for input(s): VITAMINB12, FOLATE, FERRITIN, TIBC, IRON, RETICCTPCT in the last 72 hours. Urine analysis:    Component Value Date/Time   COLORURINE YELLOW 01/21/2017 2158   APPEARANCEUR CLEAR 01/21/2017 2158   LABSPEC 1.016 01/21/2017 2158   PHURINE 5.0 01/21/2017 2158   GLUCOSEU NEGATIVE 01/21/2017 2158   HGBUR SMALL (A) 01/21/2017 2158   BILIRUBINUR NEGATIVE 01/21/2017 2158   BILIRUBINUR neg 12/06/2013 1446   KETONESUR NEGATIVE 01/21/2017 2158   PROTEINUR NEGATIVE 01/21/2017 2158   UROBILINOGEN 0.2 10/28/2014 1008   NITRITE NEGATIVE 01/21/2017 2158   LEUKOCYTESUR NEGATIVE 01/21/2017 2158   Sepsis Labs: '@LABRCNTIP' (procalcitonin:4,lacticidven:4) )No results found for this or any previous visit (from the past 240 hour(s)).   Radiological Exams on Admission: No results found.   EKG:  Not done in ED, will get one.   Assessment/Plan Principal Problem:   Abdominal wall cellulitis Active Problems:   Morbid obesity ([facility])   CLL (chronic lymphocytic leukemia) ([facility])   Sepsis ([place])   Essential hypertension   Insect bite   Hyperkalemia   OSA (obstructive sleep apnea)   Sepsis due to Abdominal wall cellulitis:Patient meets criteria for sepsis with leukocytosis, tachycardia, tachypnea and fever. Lactic acid is normal. Hemodynamically stable currently.  - will admit to med-surg bed as inpt - Empiric antimicrobial treatment with vancomycin - PRN Zofran for nausea, dilaudid and Percocet for pain - Blood cultures x 2  - ESR and CRP - will get Procalcitonin and trend  lactic acid levels per sepsis protocol. - IVF: 3 L of NS bolus in ED, followed by 100 cc/h  Essential hypertension:  -hold lasix due to sepsis -IV hydralazine  when necessary  Insect bite: -give one dose of doxycycline, 200 mg 1  CLL (chronic lymphocytic leukemia) ([place]): in remission. No on chemo. Pt is followed up by hematologist, Dr. [name] [facility].  -f/u with Dr. [name]  Hyperkalemia: Potassium 5.2. -will get EKG -Kayexalate 15 g 1  OSA: -CPAP     DVT ppx:  SQ Lovenox Code Status: Full code Family Communication: Yes, patient's mother at bed side Disposition Plan:  Anticipate discharge back to previous home environment Consults called:  none Admission status:   medical floor/inpt Date of Service 01/22/2017    Ivor Costa [facility] Pager [phone]  If 7PM-7AM, please contact night-coverage [email or web address] Password [email or web address] 01/22/2017, 1:58 AM

## 2017-01-23 DIAGNOSIS — E875 Hyperkalemia: Secondary | ICD-10-CM

## 2017-01-23 LAB — CBC
HEMATOCRIT: 35.7 % — AB (ref 39.0–52.0)
HEMOGLOBIN: 11.1 g/dL — AB (ref 13.0–17.0)
MCH: 27.1 pg (ref 26.0–34.0)
MCHC: 31.1 g/dL (ref 30.0–36.0)
MCV: 87.1 fL (ref 78.0–100.0)
Platelets: 143 10*3/uL — ABNORMAL LOW (ref 150–400)
RBC: 4.1 MIL/uL — AB (ref 4.22–5.81)
RDW: 16.3 % — ABNORMAL HIGH (ref 11.5–15.5)
WBC: 37.5 10*3/uL — AB (ref 4.0–10.5)

## 2017-01-23 LAB — BASIC METABOLIC PANEL
ANION GAP: 6 (ref 5–15)
BUN: 9 mg/dL (ref 6–20)
CALCIUM: 7.6 mg/dL — AB (ref 8.9–10.3)
CO2: 25 mmol/L (ref 22–32)
Chloride: 106 mmol/L (ref 101–111)
Creatinine, Ser: 0.62 mg/dL (ref 0.61–1.24)
GLUCOSE: 130 mg/dL — AB (ref 65–99)
Potassium: 4 mmol/L (ref 3.5–5.1)
Sodium: 137 mmol/L (ref 135–145)

## 2017-01-23 NOTE — Progress Notes (Signed)
PROGRESS NOTE    Henry Hinton  FBP:[id number] DOB: Apr 19, 1961 DOA: 01/21/2017 PCP: [name], MD    Brief Narrative:  56 year old male presents to the hospital with chief complaint abdominal erythema, tenderness, fevers and chills. Patient has significant medical history for morbid obesity, CLL, hypertension, obstructive sleep apnea. He developed acute onset of abdominal tenderness and redness, sharp and severe pain, nonradiating. Symptoms started after local insect bite. On initial physical examination, blood pressure 158/74, heart rate 93 203, respiratory 20, temperature 100.9, oxygen saturation 100%. Moist mucous membranes, lungs were clear to auscultation bilaterally, no wheezing rales or rhonchi, heart S1-S2 present rhythmic, abdomen soft, posisitve erythema at the lower quadrants, increased local temperature, tender to touch, no purulence.  Patient admitted to the hospital working diagnosis of sepsis due to abdominal wall cellulitis.   Assessment & Plan:   Principal Problem:   Abdominal wall cellulitis Active Problems:   Morbid obesity (HCC)   CLL (chronic lymphocytic leukemia) (HCC)   Sepsis ([place])   Essential hypertension   Insect bite   Hyperkalemia   OSA (obstructive sleep apnea)  1.Sepsis due to abdominal wall cellulitis. Patient responding well to IV vancomycin with improvement of the rash, no nausea or vomiting, no diarrhea. Will continue to follow cell count, cultures and temperature curve. Continue pain control.  2. HTN. Will continue blood pressure control.   3. CLL. White cell count trending down, will continue to follow on cell count, patient with immunosuppression due to leukemia   4. Hyperkalemia. Serum K down to 4.0 from 4,9, with preserved renal function, will continue to follow on renal panel in am, patient is tolerating well po, will hold on IV fluids.    DVT prophylaxis: enoxaparin  Code Status: full Family Communication:  Disposition Plan:  home    Consultants:     Procedures:     Antimicrobials:   Vancomycin    Subjective: Patient with improved abdominal pain and rash, no nausea or vomiting, no chest pain or dyspnea,.   Objective: Vitals:   01/22/17 1403 01/22/17 2108 01/23/17 0457 01/23/17 1419  BP: (!) 149/70 (!) 159/67 128/63 136/66  Pulse: 73 69 67 64  Resp: 18 18 18 20   Temp: 98.3 F (36.8 C) 98.1 F (36.7 C) 98.8 F (37.1 C) 98.7 F (37.1 C)  TempSrc: Oral Oral Oral Oral  SpO2: 99% 99% 97% 95%  Weight:      Height:        Intake/Output Summary (Last 24 hours) at 01/23/17 1503 Last data filed at 01/23/17 1420  Gross per 24 hour  Intake          1875.33 ml  Output                0 ml  Net          1875.33 ml   Filed Weights   01/21/17 1954 01/22/17 0930  Weight: (!) 183.3 kg (404 lb) (!) 183.3 kg (404 lb)    Examination:  General: Not in pain or dyspnea Neurology: Awake and alert, non focal  E ENT: no pallor, no icterus, oral mucosa moist Cardiovascular: No JVD. S1-S2 present, rhythmic, no gallops, rubs, or murmurs. No lower extremity edema. Pulmonary: vesicular breath sounds bilaterally, adequate air movement, no wheezing, rhonchi or rales. Gastrointestinal. Abdomen protuberant, no organomegaly, non tender, no rebound or guarding Skin. Erythema at the lower abdomen, with increase local temperature, not expanding beyond demarcated area Musculoskeletal: no joint deformities     Data Reviewed:  I have personally reviewed following labs and imaging studies  CBC:  Recent Labs Lab 01/21/17 2208 01/22/17 0345 01/23/17 0416  WBC 53.0* 48.3* 37.5*  NEUTROABS 11.7*  --   --   HGB 12.7* 11.7* 11.1*  HCT 41.3 37.3* 35.7*  MCV 87.9 86.3 87.1  PLT 178 161 376*   Basic Metabolic Panel:  Recent Labs Lab 01/21/17 2208 01/22/17 0345 01/23/17 0416  NA 136 136 137  K 5.2* 4.9 4.0  CL 101 105 106  CO2 25 22 25   GLUCOSE 153* 174* 130*  BUN 20 16 9   CREATININE 1.00 0.78 0.62    CALCIUM 8.6* 7.9* 7.6*   GFR: Estimated Creatinine Clearance: 168.7 mL/min (by C-G formula based on SCr of 0.62 mg/dL). Liver Function Tests:  Recent Labs Lab 01/21/17 2208  AST 28  ALT 26  ALKPHOS 108  BILITOT 0.3  PROT 7.2  ALBUMIN 3.9   No results for input(s): LIPASE, AMYLASE in the last 168 hours. No results for input(s): AMMONIA in the last 168 hours. Coagulation Profile: No results for input(s): INR, PROTIME in the last 168 hours. Cardiac Enzymes: No results for input(s): CKTOTAL, CKMB, CKMBINDEX, TROPONINI in the last 168 hours. BNP (last 3 results) No results for input(s): PROBNP in the last 8760 hours. HbA1C: No results for input(s): HGBA1C in the last 72 hours. CBG: No results for input(s): GLUCAP in the last 168 hours. Lipid Profile: No results for input(s): CHOL, HDL, LDLCALC, TRIG, CHOLHDL, LDLDIRECT in the last 72 hours. Thyroid Function Tests: No results for input(s): TSH, T4TOTAL, FREET4, T3FREE, THYROIDAB in the last 72 hours. Anemia Panel: No results for input(s): VITAMINB12, FOLATE, FERRITIN, TIBC, IRON, RETICCTPCT in the last 72 hours.    Radiology Studies: I have reviewed all of the imaging during this hospital visit personally     Scheduled Meds: . aspirin EC  81 mg Oral Daily  . enoxaparin (LOVENOX) injection  90 mg Subcutaneous Q24H  . ferrous sulfate  325 mg Oral Q breakfast  . multivitamin with minerals  1 tablet Oral Daily   Continuous Infusions: . sodium chloride 100 mL/hr at 01/23/17 0432  . vancomycin Stopped (01/23/17 1210)     LOS: 1 day        Roseann Kees Gerome Apley, MD [facility] Pager [phone]

## 2017-01-23 NOTE — Progress Notes (Signed)
Patient has home CPAP unit at bedside and states he places himself on and off when ready. RT informed patient if he has any trouble have RN contact RT.

## 2017-01-23 NOTE — Progress Notes (Signed)
Offered assistance with bath. Pt stated that he will get a shower once his IV fluids have finished and does not require any assistance.

## 2017-01-24 DIAGNOSIS — I10 Essential (primary) hypertension: Secondary | ICD-10-CM

## 2017-01-24 DIAGNOSIS — A419 Sepsis, unspecified organism: Principal | ICD-10-CM

## 2017-01-24 DIAGNOSIS — C9190 Lymphoid leukemia, unspecified not having achieved remission: Secondary | ICD-10-CM

## 2017-01-24 DIAGNOSIS — L03311 Cellulitis of abdominal wall: Secondary | ICD-10-CM

## 2017-01-24 LAB — CBC WITH DIFFERENTIAL/PLATELET
BASOS ABS: 0 10*3/uL (ref 0.0–0.1)
Basophils Relative: 0 %
EOS ABS: 0.4 10*3/uL (ref 0.0–0.7)
Eosinophils Relative: 1 %
HCT: 36.2 % — ABNORMAL LOW (ref 39.0–52.0)
HEMOGLOBIN: 11 g/dL — AB (ref 13.0–17.0)
LYMPHS PCT: 86 %
Lymphs Abs: 32.6 10*3/uL — ABNORMAL HIGH (ref 0.7–4.0)
MCH: 26.6 pg (ref 26.0–34.0)
MCHC: 30.4 g/dL (ref 30.0–36.0)
MCV: 87.7 fL (ref 78.0–100.0)
Monocytes Absolute: 0.8 10*3/uL (ref 0.1–1.0)
Monocytes Relative: 2 %
NEUTROS PCT: 11 %
Neutro Abs: 4.2 10*3/uL (ref 1.7–7.7)
Platelets: 148 10*3/uL — ABNORMAL LOW (ref 150–400)
RBC: 4.13 MIL/uL — ABNORMAL LOW (ref 4.22–5.81)
RDW: 15.8 % — ABNORMAL HIGH (ref 11.5–15.5)
WBC: 38 10*3/uL — ABNORMAL HIGH (ref 4.0–10.5)

## 2017-01-24 LAB — BASIC METABOLIC PANEL
Anion gap: 6 (ref 5–15)
BUN: 8 mg/dL (ref 6–20)
CHLORIDE: 105 mmol/L (ref 101–111)
CO2: 27 mmol/L (ref 22–32)
CREATININE: 0.62 mg/dL (ref 0.61–1.24)
Calcium: 8 mg/dL — ABNORMAL LOW (ref 8.9–10.3)
GFR calc non Af Amer: >60 mL/min (ref >60)
Glucose, Bld: 117 mg/dL — ABNORMAL HIGH (ref 65–99)
POTASSIUM: 4.2 mmol/L (ref 3.5–5.1)
SODIUM: 138 mmol/L (ref 135–145)

## 2017-01-24 LAB — VANCOMYCIN, TROUGH: VANCOMYCIN TR: 8 ug/mL — AB (ref 15–20)

## 2017-01-24 MED ORDER — VANCOMYCIN HCL 10 G IV SOLR
1500.0000 mg | Freq: Two times a day (BID) | INTRAVENOUS | Status: DC
  Administered 2017-01-24 – 2017-01-25 (×2): 1500 mg via INTRAVENOUS
  Filled 2017-01-24 (×2): qty 1500

## 2017-01-24 NOTE — Progress Notes (Signed)
PROGRESS NOTE    Henry Hinton  ZOX:[id number] DOB: 08-25-1960 DOA: 01/21/2017 PCP: [name], MD    Brief Narrative:  56 year old male with history of morbid obesity, CLL not on chemotherapy, hypertension, cellulitis, obstructive sleep apnea presented  with chief complaint of abdominal erythema, tenderness, fevers and chills.  Patient was admitted with sepsis due to abdominal wall cellulitis and started on IV vancomycin.   Assessment & Plan:   Principal Problem:   Abdominal wall cellulitis Active Problems:   Morbid obesity (HCC)   CLL (chronic lymphocytic leukemia) (HCC)   Sepsis ([place])   Essential hypertension   Insect bite   Hyperkalemia   OSA (obstructive sleep apnea)  1.Sepsis due to abdominal wall cellulitis.  - Sepsis has resolved  - Cellulitis is improving. Continue IV vancomycin for now  - Afebrile. Continue pain control  - Cultures negative so far   2. HTN. Blood pressure controlled.   3. CLL.  - Outpatient follow-up with oncologist as scheduled. Patient is currently not on chemotherapy - WBC is still elevated but stable  4. Hyperkalemia.  - Resolved  5. Chronic anemia probably anemia of chronic disease from CLL - Hemoglobin stable  6. Thrombocytopenia - Questionable cause. Monitor  7. Morbid obesity - Outpatient follow-up with primary care provider   DVT prophylaxis: enoxaparin  Code Status: full Family Communication: None at bedside Disposition Plan: home in 1-2 days   Consultants:   None  Procedures:   None  Antimicrobials:   Vancomycin from 01/21/2017   Subjective: Patient seen and examined at bedside. He feels that his abdominal wall redness is improving with decreasing pain. No overnight fever, nausea or vomiting.  Objective: Vitals:   01/23/17 0457 01/23/17 1419 01/23/17 2255 01/24/17 0633  BP: 128/63 136/66 (!) 142/71 136/76  Pulse: 67 64 (!) 57 64  Resp: 18 20 19 19   Temp: 98.8 F (37.1 C) 98.7 F (37.1 C)  98.5 F (36.9 C) 98.1 F (36.7 C)  TempSrc: Oral Oral Oral   SpO2: 97% 95% 95% 99%  Weight:      Height:        Intake/Output Summary (Last 24 hours) at 01/24/17 1210 Last data filed at 01/23/17 2230  Gross per 24 hour  Intake              712 ml  Output                0 ml  Net              712 ml   Filed Weights   01/21/17 1954 01/22/17 0930  Weight: (!) 183.3 kg (404 lb) (!) 183.3 kg (404 lb)    Examination:  General: Not in acute distress. Alert and awake Cardiovascular: S1 and S2 positive, rate controlled Pulmonary: Bilateral decreased breath sounds at bases Gastrointestinal. Abdomen morbidly obese, protuberant, no organomegaly, non tender, no rebound or guarding Skin. Erythema at the lower abdomen with no tenderness, with mild warmth, not expanding beyond demarcated area  Data Reviewed: I have personally reviewed following labs and imaging studies  CBC:  Recent Labs Lab 01/21/17 2208 01/22/17 0345 01/23/17 0416 01/24/17 0358  WBC 53.0* 48.3* 37.5* 38.0*  NEUTROABS 11.7*  --   --  4.2  HGB 12.7* 11.7* 11.1* 11.0*  HCT 41.3 37.3* 35.7* 36.2*  MCV 87.9 86.3 87.1 87.7  PLT 178 161 143* 811*   Basic Metabolic Panel:  Recent Labs Lab 01/21/17 2208 01/22/17 0345 01/23/17 0416 01/24/17 0358  NA 136 136 137 138  K 5.2* 4.9 4.0 4.2  CL 101 105 106 105  CO2 25 22 25 27   GLUCOSE 153* 174* 130* 117*  BUN 20 16 9 8   CREATININE 1.00 0.78 0.62 0.62  CALCIUM 8.6* 7.9* 7.6* 8.0*   GFR: Estimated Creatinine Clearance: 168.7 mL/min (by C-G formula based on SCr of 0.62 mg/dL). Liver Function Tests:  Recent Labs Lab 01/21/17 2208  AST 28  ALT 26  ALKPHOS 108  BILITOT 0.3  PROT 7.2  ALBUMIN 3.9   No results for input(s): LIPASE, AMYLASE in the last 168 hours. No results for input(s): AMMONIA in the last 168 hours. Coagulation Profile: No results for input(s): INR, PROTIME in the last 168 hours. Cardiac Enzymes: No results for input(s): CKTOTAL, CKMB,  CKMBINDEX, TROPONINI in the last 168 hours. BNP (last 3 results) No results for input(s): PROBNP in the last 8760 hours. HbA1C: No results for input(s): HGBA1C in the last 72 hours. CBG: No results for input(s): GLUCAP in the last 168 hours. Lipid Profile: No results for input(s): CHOL, HDL, LDLCALC, TRIG, CHOLHDL, LDLDIRECT in the last 72 hours. Thyroid Function Tests: No results for input(s): TSH, T4TOTAL, FREET4, T3FREE, THYROIDAB in the last 72 hours. Anemia Panel: No results for input(s): VITAMINB12, FOLATE, FERRITIN, TIBC, IRON, RETICCTPCT in the last 72 hours.    Radiology Studies: I have reviewed all of the imaging during this hospital visit personally     Scheduled Meds: . aspirin EC  81 mg Oral Daily  . enoxaparin (LOVENOX) injection  90 mg Subcutaneous Q24H  . ferrous sulfate  325 mg Oral Q breakfast  . multivitamin with minerals  1 tablet Oral Daily   Continuous Infusions: . vancomycin 1,250 mg (01/24/17 0950)          Aline August, MD [facility] Pager [phone]

## 2017-01-24 NOTE — Progress Notes (Signed)
Pharmacy Antibiotic Note  Henry Hinton is a 56 y.o. male admitted on 01/21/2017 with abdominal wall cellulitis. Pharmacy has been consulted for vancomycin dosing.  Noted patient with previously therapeutic VT on 1500mg  IV q12hr in 2017 with Scr = 0.64.    Plan: Increase vancomycin to 1500 mg IV q12hr Monitor renal function, clinical picture, and culture data F/u length of therapy   Height: 5\' 9"  (175.3 cm) Weight: (!) 404 lb (183.3 kg) IBW/kg (Calculated) : 70.7  Temp (24hrs), Avg:98.4 F (36.9 C), Min:98.1 F (36.7 C), Max:98.5 F (36.9 C)   Recent Labs Lab 01/21/17 2208 01/21/17 2222 01/22/17 0345 01/23/17 0416 01/24/17 0358 01/24/17 2100  WBC 53.0*  --  48.3* 37.5* 38.0*  --   CREATININE 1.00  --  0.78 0.62 0.62  --   LATICACIDVEN  --  1.46 1.3  --   --   --   VANCOTROUGH  --   --   --   --   --  8*    Estimated Creatinine Clearance: 168.7 mL/min (by C-G formula based on SCr of 0.62 mg/dL).    Allergies  Allergen Reactions  . Adhesive [Tape] Other (See Comments)    Blisters and rash at site    Antimicrobials this admission: 9/24 doxy x1 9/24 Vanc >>   Microbiology results: 9/23 BCx ngtd    Thank you for allowing [place] to participate in this patients care.  Jens Som, PharmD Clinical phone for 01/24/2017 from 3:30-10:30p: [phone] If after 10:30p, please call main pharmacy at: [phone] 01/24/2017 10:19 PM

## 2017-01-25 LAB — BASIC METABOLIC PANEL
Anion gap: 9 (ref 5–15)
BUN: 9 mg/dL (ref 6–20)
CHLORIDE: 103 mmol/L (ref 101–111)
CO2: 24 mmol/L (ref 22–32)
CREATININE: 0.52 mg/dL — AB (ref 0.61–1.24)
Calcium: 8.1 mg/dL — ABNORMAL LOW (ref 8.9–10.3)
GFR calc Af Amer: >60 mL/min (ref >60)
GFR calc non Af Amer: >60 mL/min (ref >60)
Glucose, Bld: 117 mg/dL — ABNORMAL HIGH (ref 65–99)
Potassium: 4.2 mmol/L (ref 3.5–5.1)
Sodium: 136 mmol/L (ref 135–145)

## 2017-01-25 LAB — CBC WITH DIFFERENTIAL/PLATELET
BASOS ABS: 0.1 10*3/uL (ref 0.0–0.1)
Basophils Relative: 0 %
EOS ABS: 0.3 10*3/uL (ref 0.0–0.7)
EOS PCT: 1 %
HCT: 37.2 % — ABNORMAL LOW (ref 39.0–52.0)
Hemoglobin: 11.8 g/dL — ABNORMAL LOW (ref 13.0–17.0)
Lymphocytes Relative: 88 %
Lymphs Abs: 36.1 10*3/uL — ABNORMAL HIGH (ref 0.7–4.0)
MCH: 27.5 pg (ref 26.0–34.0)
MCHC: 31.7 g/dL (ref 30.0–36.0)
MCV: 86.7 fL (ref 78.0–100.0)
MONOS PCT: 1 %
Monocytes Absolute: 0.5 10*3/uL (ref 0.1–1.0)
NEUTROS PCT: 10 %
Neutro Abs: 4.2 10*3/uL (ref 1.7–7.7)
PLATELETS: 195 10*3/uL (ref 150–400)
RBC: 4.29 MIL/uL (ref 4.22–5.81)
RDW: 15.6 % — ABNORMAL HIGH (ref 11.5–15.5)
WBC: 41.2 10*3/uL — ABNORMAL HIGH (ref 4.0–10.5)

## 2017-01-25 LAB — MAGNESIUM: Magnesium: 1.9 mg/dL (ref 1.7–2.4)

## 2017-01-25 MED ORDER — CEPHALEXIN 500 MG PO CAPS: 500.0000 mg | ORAL_CAPSULE | Freq: Four times a day (QID) | ORAL | 0 refills | Status: AC

## 2017-01-25 MED ORDER — DOXYCYCLINE HYCLATE 100 MG PO TABS: 100.0000 mg | ORAL_TABLET | Freq: Two times a day (BID) | ORAL | 0 refills | Status: AC

## 2017-01-25 NOTE — Discharge Summary (Signed)
Physician Discharge Summary  Henry Hinton YPP:[id number] DOB: [birth year] DOA: 01/21/2017  PCP: [name], MD  Admit date: 01/21/2017 Discharge date: 01/25/2017  Admitted From: Home Disposition:  Home  Recommendations for Outpatient Follow-up:  1. Follow up with PCP in 1 week    Home Health: No  Equipment/Devices: None  Discharge Condition: Stable  CODE STATUS: Full  Diet recommendation: Heart Healthy   Brief/Interim Summary: 56 year old male with history of morbid obesity, CLL not on chemotherapy, hypertension, cellulitis, obstructive sleep apnea presented  with chief complaint of abdominal erythema, tenderness, fevers and chills.  Patient was admitted with sepsis due to abdominal wall cellulitis and started on IV vancomycin. Cultures have been negative. He is afebrile. Cellulitis is improving. He will be discharged on 10 more days of oral Doxycycline and Keflex.   Discharge Diagnoses:  Principal Problem:   Abdominal wall cellulitis Active Problems:   Morbid obesity (HCC)   CLL (chronic lymphocytic leukemia) (HCC)   Sepsis ([place])   Essential hypertension   Insect bite   Hyperkalemia   OSA (obstructive sleep apnea)   1.Sepsis due to abdominal wall cellulitis.  - Sepsis has resolved  - Cellulitis is improving. Currently on IV vancomycin - Afebrile.  - Cultures negative so far  - discharge on 10 more days of oral Doxycycline and Keflex.   2. HTN. Blood pressure controlled.   3. CLL.  - Outpatient follow-up with oncologist as scheduled. Patient is currently not on chemotherapy - WBC is still elevated but stable  4. Hyperkalemia.  - Resolved  5. Chronic anemia probably anemia of chronic disease from CLL - Hemoglobin stable  6. Thrombocytopenia - Questionable cause. Resolved  7. Morbid obesity - Outpatient follow-up with primary care provider  Discharge Instructions  Discharge Instructions    Call MD for:  difficulty breathing, headache or  visual disturbances    Complete by:  As directed    Call MD for:  extreme fatigue    Complete by:  As directed    Call MD for:  hives    Complete by:  As directed    Call MD for:  persistant dizziness or light-headedness    Complete by:  As directed    Call MD for:  persistant nausea and vomiting    Complete by:  As directed    Call MD for:  severe uncontrolled pain    Complete by:  As directed    Call MD for:  temperature >100.4    Complete by:  As directed    Diet - low sodium heart healthy    Complete by:  As directed    Increase activity slowly    Complete by:  As directed      Allergies as of 01/25/2017      Reactions   Adhesive [tape] Other (See Comments)   Blisters and rash at site      Medication List    TAKE these medications   aspirin EC 81 MG tablet Take 81 mg by mouth daily.   cephALEXin 500 MG capsule Commonly known as:  KEFLEX Take 1 capsule (500 mg total) by mouth 4 (four) times daily.   doxycycline 100 MG tablet Commonly known as:  VIBRA-TABS Take 1 tablet (100 mg total) by mouth 2 (two) times daily.   furosemide 40 MG tablet Commonly known as:  LASIX Take 1 tablet (40 mg total) by mouth daily. Office visit needed for refills   ibuprofen 200 MG tablet Commonly known as:  ADVIL,MOTRIN  Take 800 mg by mouth every 6 (six) hours as needed for moderate pain.   IRON SUPPLEMENT 325 (65 FE) MG tablet Generic drug:  ferrous sulfate Take 325 mg by mouth daily with breakfast.   multivitamin with minerals tablet Take 1 tablet by mouth daily.   potassium chloride 10 MEQ tablet Commonly known as:  KLOR-CON 10 Take 1 tablet (10 mEq total) by mouth daily. Office visit needed for 90 day supply            Discharge Care Instructions        Start     Ordered   01/25/17 0000  cephALEXin (KEFLEX) 500 MG capsule  4 times daily     01/25/17 1218   01/25/17 0000  doxycycline (VIBRA-TABS) 100 MG tablet  2 times daily     01/25/17 1218   01/25/17 0000   Increase activity slowly     01/25/17 1218   01/25/17 0000  Diet - low sodium heart healthy     01/25/17 1218   01/25/17 0000  Call MD for:  temperature >100.4     01/25/17 1218   01/25/17 0000  Call MD for:  persistant nausea and vomiting     01/25/17 1218   01/25/17 0000  Call MD for:  severe uncontrolled pain     01/25/17 1218   01/25/17 0000  Call MD for:  difficulty breathing, headache or visual disturbances     01/25/17 1218   01/25/17 0000  Call MD for:  hives     01/25/17 1218   01/25/17 0000  Call MD for:  persistant dizziness or light-headedness     01/25/17 1218   01/25/17 0000  Call MD for:  extreme fatigue     01/25/17 1218     Follow-up Information    [name], MD Follow up in 1 week(s).   Specialties:  Family Medicine, Sports Medicine Contact information: [place] [address] [phone]          Allergies  Allergen Reactions  . Adhesive [Tape] Other (See Comments)    Blisters and rash at site    Consultations:  None   Procedures/Studies: None  Subjective: Patient seen and examined at bedside. He feels that his abdominal wall redness is improving with decreasing pain. No overnight fever, nausea or vomiting. He wants to go home today.  Discharge Exam: Vitals:   01/24/17 1421 01/24/17 2253  BP: (!) 142/73 (!) 164/72  Pulse: 60 62  Resp: 18 18  Temp: 98.5 F (36.9 C) 98 F (36.7 C)  SpO2: 98% 99%   Vitals:   01/23/17 2255 01/24/17 0633 01/24/17 1421 01/24/17 2253  BP: (!) 142/71 136/76 (!) 142/73 (!) 164/72  Pulse: (!) 57 64 60 62  Resp: 19 19 18 18   Temp: 98.5 F (36.9 C) 98.1 F (36.7 C) 98.5 F (36.9 C) 98 F (36.7 C)  TempSrc: Oral  Oral Oral  SpO2: 95% 99% 98% 99%  Weight:      Height:        General: Pt is alert, awake, not in acute distress Cardiovascular: rate controlled, S1/S2 + Respiratory: Bilateral decreased breath sounds at bases Abdominal: Abdomen morbidly obese, protuberant, no  organomegaly, non tender, no rebound or guarding Skin. Erythema at the lower abdomen with erythema but no tenderness, with mild warmth, not expanding beyond demarcated area    The results of significant diagnostics from this hospitalization (including imaging, microbiology, ancillary and laboratory) are listed below  for reference.     Microbiology: Recent Results (from the past 240 hour(s))  Blood culture (routine x 2)     Status: None (Preliminary result)   Collection Time: 01/21/17 12:22 AM  Result Value Ref Range Status   Specimen Description BLOOD RIGHT FOREARM  Final   Special Requests IN PEDIATRIC BOTTLE Blood Culture adequate volume  Final   Culture NO GROWTH 2 DAYS  Final   Report Status PENDING  Incomplete  Blood culture (routine x 2)     Status: None (Preliminary result)   Collection Time: 01/21/17 11:50 PM  Result Value Ref Range Status   Specimen Description BLOOD LEFT ANTECUBITAL  Final   Special Requests   Final    BOTTLES DRAWN AEROBIC AND ANAEROBIC Blood Culture adequate volume   Culture NO GROWTH 2 DAYS  Final   Report Status PENDING  Incomplete     Labs: BNP (last 3 results) No results for input(s): BNP in the last 8760 hours. Basic Metabolic Panel:  Recent Labs Lab 01/21/17 2208 01/22/17 0345 01/23/17 0416 01/24/17 0358 01/25/17 0325  NA 136 136 137 138 136  K 5.2* 4.9 4.0 4.2 4.2  CL 101 105 106 105 103  CO2 25 22 25 27 24   GLUCOSE 153* 174* 130* 117* 117*  BUN 20 16 9 8 9   CREATININE 1.00 0.78 0.62 0.62 0.52*  CALCIUM 8.6* 7.9* 7.6* 8.0* 8.1*  MG  --   --   --   --  1.9   Liver Function Tests:  Recent Labs Lab 01/21/17 2208  AST 28  ALT 26  ALKPHOS 108  BILITOT 0.3  PROT 7.2  ALBUMIN 3.9   No results for input(s): LIPASE, AMYLASE in the last 168 hours. No results for input(s): AMMONIA in the last 168 hours. CBC:  Recent Labs Lab 01/21/17 2208 01/22/17 0345 01/23/17 0416 01/24/17 0358 01/25/17 0726  WBC 53.0* 48.3* 37.5*  38.0* PENDING  NEUTROABS 11.7*  --   --  4.2 PENDING  HGB 12.7* 11.7* 11.1* 11.0* 11.8*  HCT 41.3 37.3* 35.7* 36.2* 37.2*  MCV 87.9 86.3 87.1 87.7 86.7  PLT 178 161 143* 148* 195   Cardiac Enzymes: No results for input(s): CKTOTAL, CKMB, CKMBINDEX, TROPONINI in the last 168 hours. BNP: Invalid input(s): POCBNP CBG: No results for input(s): GLUCAP in the last 168 hours. D-Dimer No results for input(s): DDIMER in the last 72 hours. Hgb A1c No results for input(s): HGBA1C in the last 72 hours. Lipid Profile No results for input(s): CHOL, HDL, LDLCALC, TRIG, CHOLHDL, LDLDIRECT in the last 72 hours. Thyroid function studies No results for input(s): TSH, T4TOTAL, T3FREE, THYROIDAB in the last 72 hours.  Invalid input(s): FREET3 Anemia work up No results for input(s): VITAMINB12, FOLATE, FERRITIN, TIBC, IRON, RETICCTPCT in the last 72 hours. Urinalysis    Component Value Date/Time   COLORURINE YELLOW 01/21/2017 2158   APPEARANCEUR CLEAR 01/21/2017 2158   LABSPEC 1.016 01/21/2017 2158   PHURINE 5.0 01/21/2017 2158   GLUCOSEU NEGATIVE 01/21/2017 2158   HGBUR SMALL (A) 01/21/2017 2158   BILIRUBINUR NEGATIVE 01/21/2017 2158   BILIRUBINUR neg 12/06/2013 1446   KETONESUR NEGATIVE 01/21/2017 2158   PROTEINUR NEGATIVE 01/21/2017 2158   UROBILINOGEN 0.2 10/28/2014 1008   NITRITE NEGATIVE 01/21/2017 2158   LEUKOCYTESUR NEGATIVE 01/21/2017 2158   Sepsis Labs Invalid input(s): PROCALCITONIN,  WBC,  LACTICIDVEN Microbiology Recent Results (from the past 240 hour(s))  Blood culture (routine x 2)     Status: None (Preliminary result)  Collection Time: 01/21/17 12:22 AM  Result Value Ref Range Status   Specimen Description BLOOD RIGHT FOREARM  Final   Special Requests IN PEDIATRIC BOTTLE Blood Culture adequate volume  Final   Culture NO GROWTH 2 DAYS  Final   Report Status PENDING  Incomplete  Blood culture (routine x 2)     Status: None (Preliminary result)   Collection Time:  01/21/17 11:50 PM  Result Value Ref Range Status   Specimen Description BLOOD LEFT ANTECUBITAL  Final   Special Requests   Final    BOTTLES DRAWN AEROBIC AND ANAEROBIC Blood Culture adequate volume   Culture NO GROWTH 2 DAYS  Final   Report Status PENDING  Incomplete     Time coordinating discharge: 35 minutes  SIGNED:   Aline August, MD  [facility] 01/25/2017, 12:19 PM Pager: [phone]  If 7PM-7AM, please contact night-coverage [email or web address] Password [email or web address]

## 2017-01-27 LAB — CULTURE, BLOOD (ROUTINE X 2)
CULTURE: NO GROWTH
Culture: NO GROWTH
SPECIAL REQUESTS: ADEQUATE
Special Requests: ADEQUATE

## 2017-02-27 ENCOUNTER — Ambulatory Visit (INDEPENDENT_AMBULATORY_CARE_PROVIDER_SITE_OTHER): Payer: BC Managed Care – PPO

## 2017-02-27 ENCOUNTER — Encounter: Payer: Self-pay | Admitting: Family Medicine

## 2017-02-27 ENCOUNTER — Ambulatory Visit (INDEPENDENT_AMBULATORY_CARE_PROVIDER_SITE_OTHER): Payer: BC Managed Care – PPO | Admitting: Family Medicine

## 2017-02-27 VITALS — BP 138/82 | HR 75 | Temp 98.4°F | Resp 16 | Ht 67.72 in | Wt >= 6400 oz

## 2017-02-27 DIAGNOSIS — Z6841 Body Mass Index (BMI) 40.0 and over, adult: Secondary | ICD-10-CM | POA: Diagnosis not present

## 2017-02-27 DIAGNOSIS — Z13220 Encounter for screening for lipoid disorders: Secondary | ICD-10-CM | POA: Diagnosis not present

## 2017-02-27 DIAGNOSIS — Z0000 Encounter for general adult medical examination without abnormal findings: Secondary | ICD-10-CM

## 2017-02-27 DIAGNOSIS — Z23 Encounter for immunization: Secondary | ICD-10-CM | POA: Diagnosis not present

## 2017-02-27 DIAGNOSIS — Z8042 Family history of malignant neoplasm of prostate: Secondary | ICD-10-CM

## 2017-02-27 DIAGNOSIS — R609 Edema, unspecified: Secondary | ICD-10-CM

## 2017-02-27 DIAGNOSIS — Z125 Encounter for screening for malignant neoplasm of prostate: Secondary | ICD-10-CM | POA: Diagnosis not present

## 2017-02-27 DIAGNOSIS — R739 Hyperglycemia, unspecified: Secondary | ICD-10-CM | POA: Diagnosis not present

## 2017-02-27 DIAGNOSIS — M79672 Pain in left foot: Secondary | ICD-10-CM

## 2017-02-27 NOTE — Patient Instructions (Addendum)
Compression stockings may help during the workday.  Continue lasix and follow up as planned with nephrology. Let me know if I need to refill meds.   Heel xray today. I will let you know if there are any issues, but pain appears to be due to plantar fasciitis. Stretch as discussed, ice massage, night splints if needed. Follow up with podiatry or Raliegh Ip if needed for continued pain.   Let me know if you need a referral to look into gastric bypass for weight loss.   I do recommend flu vaccine - let me know if you change your mind. You may also need to have pneumonia vaccine, but would discuss that with your hematologist.   Tdap today, good for 10 years.   I will check prostate test today.    Plantar Fasciitis Rehab Ask your health care provider which exercises are safe for you. Do exercises exactly as told by your health care provider and adjust them as directed. It is normal to feel mild stretching, pulling, tightness, or discomfort as you do these exercises, but you should stop right away if you feel sudden pain or your pain gets worse. Do not begin these exercises until told by your health care provider. Stretching and range of motion exercises These exercises warm up your muscles and joints and improve the movement and flexibility of your foot. These exercises also help to relieve pain. Exercise A: Plantar fascia stretch  1. Sit with your left / right leg crossed over your opposite knee. 2. Hold your heel with one hand with that thumb near your arch. With your other hand, hold your toes and gently pull them back toward the top of your foot. You should feel a stretch on the bottom of your toes or your foot or both. 3. Hold this stretch for__________ seconds. 4. Slowly release your toes and return to the starting position. Repeat __________ times. Complete this exercise __________ times a day. Exercise B: Gastroc, standing  1. Stand with your hands against a wall. 2. Extend your left  / right leg behind you, and bend your front knee slightly. 3. Keeping your heels on the floor and keeping your back knee straight, shift your weight toward the wall without arching your back. You should feel a gentle stretch in your left / right calf. 4. Hold this position for __________ seconds. Repeat __________ times. Complete this exercise __________ times a day. Exercise C: Soleus, standing 1. Stand with your hands against a wall. 2. Extend your left / right leg behind you, and bend your front knee slightly. 3. Keeping your heels on the floor, bend your back knee and slightly shift your weight over the back leg. You should feel a gentle stretch deep in your calf. 4. Hold this position for __________ seconds. Repeat __________ times. Complete this exercise __________ times a day. Exercise D: Gastrocsoleus, standing 1. Stand with the ball of your left / right foot on a step. The ball of your foot is on the walking surface, right under your toes. 2. Keep your other foot firmly on the same step. 3. Hold onto the wall or a railing for balance. 4. Slowly lift your other foot, allowing your body weight to press your heel down over the edge of the step. You should feel a stretch in your left / right calf. 5. Hold this position for __________ seconds. 6. Return both feet to the step. 7. Repeat this exercise with a slight bend in your left /  right knee. Repeat __________ times with your left / right knee straight and __________ times with your left / right knee bent. Complete this exercise __________ times a day. Balance exercise This exercise builds your balance and strength control of your arch to help take pressure off your plantar fascia. Exercise E: Single leg stand 1. Without shoes, stand near a railing or in a doorway. You may hold onto the railing or door frame as needed. 2. Stand on your left / right foot. Keep your big toe down on the floor and try to keep your arch lifted. Do not let  your foot roll inward. 3. Hold this position for __________ seconds. 4. If this exercise is too easy, you can try it with your eyes closed or while standing on a pillow. Repeat __________ times. Complete this exercise __________ times a day. This information is not intended to replace advice given to you by your health care provider. Make sure you discuss any questions you have with your health care provider. Document Released: 04/17/2005 Document Revised: 12/21/2015 Document Reviewed: 03/01/2015 Elsevier Interactive Patient Education  2018 [place].  Plantar Fasciitis Plantar fasciitis is a painful foot condition that affects the heel. It occurs when the band of tissue that connects the toes to the heel bone (plantar fascia) becomes irritated. This can happen after exercising too much or doing other repetitive activities (overuse injury). The pain from plantar fasciitis can range from mild irritation to severe pain that makes it difficult for you to walk or move. The pain is usually worse in the morning or after you have been sitting or lying down for a while. What are the causes? This condition may be caused by:  Standing for long periods of time.  Wearing shoes that do not fit.  Doing high-impact activities, including running, aerobics, and ballet.  Being overweight.  Having an abnormal way of walking (gait).  Having tight calf muscles.  Having high arches in your feet.  Starting a new athletic activity.  What are the signs or symptoms? The main symptom of this condition is heel pain. Other symptoms include:  Pain that gets worse after activity or exercise.  Pain that is worse in the morning or after resting.  Pain that goes away after you walk for a few minutes.  How is this diagnosed? This condition may be diagnosed based on your signs and symptoms. Your health care provider will also do a physical exam to check for:  A tender area on the bottom of your foot.  A  high arch in your foot.  Pain when you move your foot.  Difficulty moving your foot.  You may also need to have imaging studies to confirm the diagnosis. These can include:  X-rays.  Ultrasound.  MRI.  How is this treated? Treatment for plantar fasciitis depends on the severity of the condition. Your treatment may include:  Rest, ice, and over-the-counter pain medicines to manage your pain.  Exercises to stretch your calves and your plantar fascia.  A splint that holds your foot in a stretched, upward position while you sleep (night splint).  Physical therapy to relieve symptoms and prevent problems in the future.  Cortisone injections to relieve severe pain.  Extracorporeal shock wave therapy (ESWT) to stimulate damaged plantar fascia with electrical impulses. It is often used as a last resort before surgery.  Surgery, if other treatments have not worked after 12 months.  Follow these instructions at home:  Take medicines only as directed  by your health care provider.  Avoid activities that cause pain.  Roll the bottom of your foot over a bag of ice or a bottle of cold water. Do this for 20 minutes, 3-4 times a day.  Perform simple stretches as directed by your health care provider.  Try wearing athletic shoes with air-sole or gel-sole cushions or soft shoe inserts.  Wear a night splint while sleeping, if directed by your health care provider.  Keep all follow-up appointments with your health care provider. How is this prevented?  Do not perform exercises or activities that cause heel pain.  Consider finding low-impact activities if you continue to have problems.  Lose weight if you need to. The best way to prevent plantar fasciitis is to avoid the activities that aggravate your plantar fascia. Contact a health care provider if:  Your symptoms do not go away after treatment with home care measures.  Your pain gets worse.  Your pain affects your ability to  move or do your daily activities. This information is not intended to replace advice given to you by your health care provider. Make sure you discuss any questions you have with your health care provider. Document Released: 01/10/2001 Document Revised: 09/20/2015 Document Reviewed: 02/25/2014 Elsevier Interactive Patient Education  2018 [place] [employer]  Get these tests  Blood pressure- Have your blood pressure checked once a year by your healthcare provider.  Normal blood pressure is 120/80  Weight- Have your body mass index (BMI) calculated to screen for obesity.  BMI is a measure of body fat based on height and weight. You can also calculate your own BMI at [email or web address].  Cholesterol- Have your cholesterol checked every year.  Diabetes- Have your blood sugar checked regularly if you have high blood pressure, high cholesterol, have a family history of diabetes or if you are overweight.  Screening for Colon Cancer- Colonoscopy starting at age 32.  Screening may begin sooner depending on your family history and other health conditions. Follow up colonoscopy as directed by your Gastroenterologist.  Screening for Prostate Cancer- Both blood work (PSA) and a rectal exam help screen for Prostate Cancer.  Screening begins at age 50 with African-American men and at age 35 with Caucasian men.  Screening may begin sooner depending on your family history.  Take these medicines  Aspirin- One aspirin daily can help prevent Heart disease and Stroke.  Flu shot- Every fall.  Tetanus- Every 10 years.  Zostavax- Once after the age of 83 to prevent Shingles.  Pneumonia shot- Once after the age of 67; if you are younger than 68, ask your healthcare provider if you need a Pneumonia shot.  Take these steps  Don't smoke- If you do smoke, talk to your doctor about quitting.  For tips on how to quit, go to www.smokefree.gov or call 1-800-QUIT-NOW.  Be physically active-  Exercise 5 days a week for at least 30 minutes.  If you are not already physically active start slow and gradually work up to 30 minutes of moderate physical activity.  Examples of moderate activity include walking briskly, mowing the yard, dancing, swimming, bicycling, etc.  Eat a healthy diet- Eat a variety of healthy food such as fruits, vegetables, low fat milk, low fat cheese, yogurt, lean meant, poultry, fish, beans, tofu, etc. For more information go to www.thenutritionsource.org  Drink alcohol in moderation- Limit alcohol intake to less than two drinks a day. Never drink and drive.  Dentist- Brush and floss twice  daily; visit your dentist twice a year.  Depression- Your emotional health is as important as your physical health. If you're feeling down, or losing interest in things you would normally enjoy please talk to your healthcare provider.  Eye exam- Visit your eye doctor every year.  Safe sex- If you may be exposed to a sexually transmitted infection, use a condom.  Seat belts- Seat belts can save your life; always wear one.  Smoke/Carbon Monoxide detectors- These detectors need to be installed on the appropriate level of your home.  Replace batteries at least once a year.  Skin cancer- When out in the sun, cover up and use sunscreen 15 SPF or higher.  Violence- If anyone is threatening you, please tell your healthcare provider.  Living Will/ Health care power of attorney- Speak with your healthcare provider and family.  IF you received an x-ray today, you will receive an invoice from [facility]. Please contact [facility] at [phone] with questions or concerns regarding your invoice.   IF you received labwork today, you will receive an invoice from [place]. Please contact [facility] at [phone] with questions or concerns regarding your invoice.   Our billing staff will not be able to assist you with questions regarding bills from these  companies.  You will be contacted with the lab results as soon as they are available. The fastest way to get your results is to activate your My Chart account. Instructions are located on the last page of this paperwork. If you have not heard from [place] regarding the results in 2 weeks, please contact this office.

## 2017-02-27 NOTE — Progress Notes (Signed)
Subjective:  By signing my name below, I, Henry Hinton, attest that this documentation has been prepared under the direction and in the presence of Henry Agreste, MD Electronically Signed: Ladene Artist, ED Scribe 02/27/2017 at 3:52 PM.   Patient ID: Henry Hinton, male    DOB: 03/11/1961, 56 y.o.   MRN: [id number]  Chief Complaint  Patient presents with  . Annual Exam   HPI Henry Hinton is a 56 y.o. male who presents to Primary Care at [facility] for an annual exam. H/o CLL followed by Dr. Wendee Hinton at [facility], obesity, peipheral edema, h/o kidney stones, remote h/o DVT and recurrent abdominal wall cellulitis, OSA, HTN.  CLL Most recent visit July 11. No B symptoms or adenopathy. WBC 46. Planned on close observations. On iron supplement for anemia.  Lab Results  Component Value Date   WBC 41.2 (H) 01/25/2017   HGB 11.8 (L) 01/25/2017   HCT 37.2 (L) 01/25/2017   MCV 86.7 01/25/2017   PLT 195 01/25/2017   Obesity Wt Readings from Last 3 Encounters:  02/27/17 (!) 403 lb (182.8 kg)  01/22/17 (!) 404 lb (183.3 kg)  09/08/16 (!) 414 lb (187.8 kg)  Body mass index is 61.79 kg/m. Exercise: pt is swimming 5 days/week. Denies consuming sweet teas or sodas; reports he is drinking a lot of water. Eating fast food ~2 days/week. 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   HTN Only on Lasix for now for LE edema.   Chronic LE Edema Recommended to see nephrology by his oncologist. Recent appointment with Dr. Florene Hinton. Is now on Lasix 80 mg bid and takes potassium 20 mEq bid for the past week. States increase in Lasix has improved LE swelling. Reports swelling is worse on the R and worsens as the day progresses. Denies cp, sob, h/o heart failure. Pt has a follow-up appointment in 6-8 weeks.   Recurrent Cellulitis of Abdominal Wall Admitted May 11 and September 23 with erythema, tenderness, fever and chills.  Admitted for 4 days for sepsis due to abdominal wall cellulitis. Cultures were negative. Treated with IV Vancomycin then discharged on Doxycycline and Keflex.   Hyperglycemia Pt had an A1C of 6.2 1 year ago, however, his A1C was 7.4 on 09/08/16. Pt is fasting at this visit; his last meal was around 3 AM this morning.   Obstructive Sleep Apnea on CPAP Pt is compliant with CPAP without difficulties.   CA Screening Followed by hematology. Colonoscopy: July 2017 by Dr. Henrene Hinton. Normal, repeat in 10 years Prostate CA Screening: Pt's father passed of prostate CA at age 72 with normal PSA levels.  Lab Results  Component Value Date   PSA 0.3 02/24/2016   PSA 0.38 02/15/2015   PSA 0.39 11/25/2013   Immunizations Immunization History  Administered Date(s) Administered  . Tdap 07/31/2006  Flu: pt declines Tetanus: pt agrees to update tetanus at this visit   Depression Screening Depression screen [facility] 2/9 02/27/2017 06/23/2016 04/06/2016 02/24/2016 12/16/2015  Decreased Interest 0 0 0 0 0  Down, Depressed, Hopeless 0 0 0 0 0  PHQ - 2 Score 0 0 0 0 0     Visual Acuity Screening   Right eye Left eye Both eyes  Without correction: '20/20 20/20 20/20 '  With correction:      Vision: annually  Dentist: every 6 months  Exercise: swims 5 days/week  L Heel Pain Pt reports L  heel pain since x 2 months. States he does a lot of walking at work but denies injury or change in activity. Pain is worse with taking the very first step in the morning. He has tried adding cushioned heel inserts in his shoes with temporary relief.  Patient Active Problem List   Diagnosis Date Noted  . Abdominal wall cellulitis 01/22/2017  . Insect bite 01/22/2017  . Hyperkalemia 01/22/2017  . OSA (obstructive sleep apnea) 01/22/2017  . Essential hypertension   . Hoarseness 06/08/2016  . Cellulitis 11/28/2015  . Cellulitis of trunk 11/27/2015  . Nonspecific abnormal finding in stool contents   . Colon cancer screening     . Venous stasis   . History of DVT (deep vein thrombosis)   . Sepsis ([place]) 10/09/2015  . Panniculitis 10/09/2015  . CLL (chronic lymphocytic leukemia) ([place]) 05/27/2015  . 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 Leukocytosis (leucocytosis) 08/29/2014  . HNP (herniated nucleus pulposus), cervical 01/21/2013  . bilat hip replacement 04/08/2012  . Morbid obesity ([place]) 04/08/2012  . Edema 04/08/2012   Past Medical History:  Diagnosis Date  . Anemia   . Arthritis   . BMI 50.0-59.9, adult ([place])   . Cellulitis 11/2015   trunk  . cll 11/2014   CLL - 11/30/2014- Dr. [facility] [place], [place]  . DVT (deep venous thrombosis) ([place]) 6/16   Rt calf  took xarelto for 6 months  . Family history of anesthesia complication    " father having delirium after anesthesia"  . Heart murmur    Hx; of as a child  . History of kidney stones    x2  . Joint pain    Hx: of periodically  . Numbness and tingling    Hx: of in right arm  . Pneumonia   . Sleep apnea    Cpap use- settings 10   Past Surgical History:  Procedure Laterality Date  . BACK SURGERY    . COLONOSCOPY     Hx: of  . COLONOSCOPY N/A 11/23/2015   Procedure: COLONOSCOPY;  Surgeon: Irene Shipper, MD;  Location: [facility];  Service: Endoscopy;  Laterality: N/A;  . INGUINAL HERNIA REPAIR Left 06/01/2015   Procedure: LEFT INGUINAL HERNIA REPAIR WITH MESH;  Surgeon: Autumn Messing III, MD;  Location: [facility];  Service: General;  Laterality: Left;  . INGUINAL HERNIA REPAIR    . INSERTION OF MESH Left 06/01/2015   Procedure: INSERTION OF MESH;  Surgeon: Autumn Messing III, MD;  Location: [facility] ORS;  Service: General;  Laterality: Left;  . JOINT REPLACEMENT Bilateral    BTHA  . KNEE ARTHROSCOPY Left 07/21/2014   Dr. Noemi Chapel  . LUMBAR LAMINECTOMY  2005  . POSTERIOR CERVICAL LAMINECTOMY WITH MET- RX Right 01/21/2013   Procedure: Right Sitting C6-7 Microdiskectomy with Met-rex;  Surgeon: Kristeen Miss, MD;  Location: [facility];  Service: Neurosurgery;   Laterality: Right;  Right Sitting C6-7 Microdiskectomy with Met-rex  . STENT PLACEMENT RT URETER ([facility] HX)  10/23/2014  . tenosynovitis  2000   Hx: of thumb  . TONSILLECTOMY    . TOTAL HIP ARTHROPLASTY Bilateral   . WISDOM TOOTH EXTRACTION  1974   all 4   Allergies  Allergen Reactions  . Adhesive [Tape] Other (See Comments)    Blisters and rash at site   Prior to Admission medications   Medication Sig Start Date End Date Taking? Authorizing Provider  aspirin EC 81 MG tablet Take 81 mg by mouth daily.   Yes  [provider]  ferrous sulfate (IRON SUPPLEMENT) 325 (65 FE) MG tablet Take 325 mg by mouth daily with breakfast.   Yes [provider]  furosemide (LASIX) 80 MG tablet Take 80 mg by mouth 2 (two) times daily.   Yes [provider]  ibuprofen (ADVIL,MOTRIN) 200 MG tablet Take 800 mg by mouth every 6 (six) hours as needed for moderate pain.    Yes [provider]  Multiple Vitamins-Minerals (MULTIVITAMIN WITH MINERALS) tablet Take 1 tablet by mouth daily.   Yes [provider]  potassium chloride SA (K-DUR,KLOR-CON) 20 MEQ tablet Take 20 mEq by mouth 2 (two) times daily.   Yes [provider]   Social History   Social History  . Marital status: Divorced    Spouse name: N/A  . Number of children: N/A  . Years of education: N/A   Occupational History  . 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 Smoking status: Never Smoker  . Smokeless tobacco: Former Systems developer    Types: Chew    Quit date: 05/01/1993  . Alcohol use 0.6 oz/week    1 Standard drinks or equivalent per week     Comment: [employer]  . Drug use: No  . Sexual activity: No   Other Topics Concern  . Not on file   Social History Narrative   Exercise swimming 3-5 times/week for 45 minutes   Review of Systems  Respiratory: Negative for shortness of breath.   Cardiovascular: Positive for leg swelling. Negative for chest  pain.  Musculoskeletal: Positive for arthralgias and myalgias.  Skin: Positive for wound (healed).      Objective:   Physical Exam  Constitutional: He is oriented to person, place, and time. He appears well-developed and well-nourished.  HENT:  Head: Normocephalic and atraumatic.  Right Ear: External ear normal.  Left Ear: External ear normal.  Mouth/Throat: Oropharynx is clear and moist.  Eyes: Pupils are equal, round, and reactive to light. Conjunctivae and EOM are normal.  Neck: Normal range of motion. Neck supple. No thyromegaly present.  Cardiovascular: Normal rate, regular rhythm, normal heart sounds and intact distal pulses.   Pulmonary/Chest: Effort normal and breath sounds normal. No respiratory distress. He has no wheezes.  Abdominal: Soft. He exhibits no distension. There is no tenderness. Hernia confirmed negative in the right inguinal area and confirmed negative in the left inguinal area.  Genitourinary: Prostate normal.  Musculoskeletal: Normal range of motion. He exhibits no edema or tenderness.  Malleoli are nontender. Posterior calcaneous and achilles are nontender. Tender along proximal fascia insertion to the heel.  Lymphadenopathy:    He has no cervical adenopathy.  Neurological: He is alert and oriented to person, place, and time. He has normal reflexes.  Skin: Skin is warm and dry.  R LE: Hyperpigmentation, slight woody apparence to R lower anterior leg. No skin breakdown.  L LE: Minimal woody appearence to L side with lower extremity edema. No skin breakdown.  Psychiatric: He has a normal mood and affect. His behavior is normal.  Vitals reviewed.  Vitals:   02/27/17 1523  BP: 138/82  Pulse: 75  Resp: 16  Temp: 98.4 F (36.9 C)  TempSrc: Oral  SpO2: 95%  Weight: (!) 403 lb (182.8 kg)  Height: 5' 7.72" (1.72 m)      Assessment & Plan:   Jamel Holzmann is a 56 y.o. male Annual physical exam  - -anticipatory guidance as below in AVS, screening  labs  above. Health maintenance items as above in HPI discussed/recommended as applicable.   Peripheral edema - Plan: Comprehensive metabolic panel  - Improved with treatment by nephrology on higher Lasix dosing. Did have some cramps in the office, but attributes that to not eating or less intake of fluids. Check CMP. Continue follow-up with nephrology as planned. Compression stockings were discussed during work day.  Class 3 severe obesity with body mass index (BMI) of 60.0 to 69.9 in adult, unspecified obesity type, unspecified whether serious comorbidity present [facility])  -He is still exercising with swimming, has been trying to watch diet. He is considering gastric bypass possibly early next year. Advised let me know if referral needed.   Hyperglycemia - Plan: Hemoglobin A1c  -Repeat A1c to evaluate for diabetes/prediabetes  Screening for hyperlipidemia - Plan: Comprehensive metabolic panel, Lipid panel  Screening for prostate cancer - Plan: PSA Family history of prostate cancer  -We discussed pros and cons of prostate cancer screening, and after this discussion, he chose to have screening done. PSA obtained, and no concerning findings on DRE.   Pain of left heel - Plan: DG Os Calcis Left  - Appears to be plantar fasciitis, x-ray without apparent acute findings. Handout given and discussed plantar fascial stretches. Consider Ortho versus podiatry follow-up if persistent or worsening  Need for Tdap vaccination - Plan: Tdap vaccine greater than or equal to 7yo IM  Need for prophylactic vaccination and inoculation against influenza - Plan: Flu Vaccine QUAD 36+ mos IM   Meds ordered this encounter  Medications  . furosemide (LASIX) 80 MG tablet    Sig: Take 80 mg by mouth 2 (two) times daily.  . potassium chloride SA (K-DUR,KLOR-CON) 20 MEQ tablet    Sig: Take 20 mEq by mouth 2 (two) times daily.   Patient Instructions   Compression stockings may help during the workday.  Continue lasix and  follow up as planned with nephrology. Let me know if I need to refill meds.   Heel xray today. I will let you know if there are any issues, but pain appears to be due to plantar fasciitis. Stretch as discussed, ice massage, night splints if needed. Follow up with podiatry or Raliegh Ip if needed for continued pain.   Let me know if you need a referral to look into gastric bypass for weight loss.   I do recommend flu vaccine - let me know if you change your mind. You may also need to have pneumonia vaccine, but would discuss that with your hematologist.   Tdap today, good for 10 years.   I will check prostate test today.    Plantar Fasciitis Rehab Ask your health care provider which exercises are safe for you. Do exercises exactly as told by your health care provider and adjust them as directed. It is normal to feel mild stretching, pulling, tightness, or discomfort as you do these exercises, but you should stop right away if you feel sudden pain or your pain gets worse. Do not begin these exercises until told by your health care provider. Stretching and range of motion exercises These exercises warm up your muscles and joints and improve the movement and flexibility of your foot. These exercises also help to relieve pain. Exercise A: Plantar fascia stretch  1. Sit with your left / right leg crossed over your opposite knee. 2. Hold your heel with one hand with that thumb near your arch. With your other hand, hold your toes and gently pull  them back toward the top of your foot. You should feel a stretch on the bottom of your toes or your foot or both. 3. Hold this stretch for__________ seconds. 4. Slowly release your toes and return to the starting position. Repeat __________ times. Complete this exercise __________ times a day. Exercise B: Gastroc, standing  1. Stand with your hands against a wall. 2. Extend your left / right leg behind you, and bend your front knee slightly. 3. Keeping  your heels on the floor and keeping your back knee straight, shift your weight toward the wall without arching your back. You should feel a gentle stretch in your left / right calf. 4. Hold this position for __________ seconds. Repeat __________ times. Complete this exercise __________ times a day. Exercise C: Soleus, standing 1. Stand with your hands against a wall. 2. Extend your left / right leg behind you, and bend your front knee slightly. 3. Keeping your heels on the floor, bend your back knee and slightly shift your weight over the back leg. You should feel a gentle stretch deep in your calf. 4. Hold this position for __________ seconds. Repeat __________ times. Complete this exercise __________ times a day. Exercise D: Gastrocsoleus, standing 1. Stand with the ball of your left / right foot on a step. The ball of your foot is on the walking surface, right under your toes. 2. Keep your other foot firmly on the same step. 3. Hold onto the wall or a railing for balance. 4. Slowly lift your other foot, allowing your body weight to press your heel down over the edge of the step. You should feel a stretch in your left / right calf. 5. Hold this position for __________ seconds. 6. Return both feet to the step. 7. Repeat this exercise with a slight bend in your left / right knee. Repeat __________ times with your left / right knee straight and __________ times with your left / right knee bent. Complete this exercise __________ times a day. Balance exercise This exercise builds your balance and strength control of your arch to help take pressure off your plantar fascia. Exercise E: Single leg stand 1. Without shoes, stand near a railing or in a doorway. You may hold onto the railing or door frame as needed. 2. Stand on your left / right foot. Keep your big toe down on the floor and try to keep your arch lifted. Do not let your foot roll inward. 3. Hold this position for __________  seconds. 4. If this exercise is too easy, you can try it with your eyes closed or while standing on a pillow. Repeat __________ times. Complete this exercise __________ times a day. This information is not intended to replace advice given to you by your health care provider. Make sure you discuss any questions you have with your health care provider. Document Released: 04/17/2005 Document Revised: 12/21/2015 Document Reviewed: 03/01/2015 Elsevier Interactive Patient Education  2018 [employer].  Plantar Fasciitis Plantar fasciitis is a painful foot condition that affects the heel. It occurs when the band of tissue that connects the toes to the heel bone (plantar fascia) becomes irritated. This can happen after exercising too much or doing other repetitive activities (overuse injury). The pain from plantar fasciitis can range from mild irritation to severe pain that makes it difficult for you to walk or move. The pain is usually worse in the morning or after you have been sitting or lying down for a while. What are  the causes? This condition may be caused by:  Standing for long periods of time.  Wearing shoes that do not fit.  Doing high-impact activities, including running, aerobics, and ballet.  Being overweight.  Having an abnormal way of walking (gait).  Having tight calf muscles.  Having high arches in your feet.  Starting a new athletic activity.  What are the signs or symptoms? The main symptom of this condition is heel pain. Other symptoms include:  Pain that gets worse after activity or exercise.  Pain that is worse in the morning or after resting.  Pain that goes away after you walk for a few minutes.  How is this diagnosed? This condition may be diagnosed based on your signs and symptoms. Your health care provider will also do a physical exam to check for:  A tender area on the bottom of your foot.  A high arch in your foot.  Pain when you move your  foot.  Difficulty moving your foot.  You may also need to have imaging studies to confirm the diagnosis. These can include:  X-rays.  Ultrasound.  MRI.  How is this treated? Treatment for plantar fasciitis depends on the severity of the condition. Your treatment may include:  Rest, ice, and over-the-counter pain medicines to manage your pain.  Exercises to stretch your calves and your plantar fascia.  A splint that holds your foot in a stretched, upward position while you sleep (night splint).  Physical therapy to relieve symptoms and prevent problems in the future.  Cortisone injections to relieve severe pain.  Extracorporeal shock wave therapy (ESWT) to stimulate damaged plantar fascia with electrical impulses. It is often used as a last resort before surgery.  Surgery, if other treatments have not worked after 12 months.  Follow these instructions at home:  Take medicines only as directed by your health care provider.  Avoid activities that cause pain.  Roll the bottom of your foot over a bag of ice or a bottle of cold water. Do this for 20 minutes, 3-4 times a day.  Perform simple stretches as directed by your health care provider.  Try wearing athletic shoes with air-sole or gel-sole cushions or soft shoe inserts.  Wear a night splint while sleeping, if directed by your health care provider.  Keep all follow-up appointments with your health care provider. How is this prevented?  Do not perform exercises or activities that cause heel pain.  Consider finding low-impact activities if you continue to have problems.  Lose weight if you need to. The best way to prevent plantar fasciitis is to avoid the activities that aggravate your plantar fascia. Contact a health care provider if:  Your symptoms do not go away after treatment with home care measures.  Your pain gets worse.  Your pain affects your ability to move or do your daily activities. This information  is not intended to replace advice given to you by your health care provider. Make sure you discuss any questions you have with your health care provider. Document Released: 01/10/2001 Document Revised: 09/20/2015 Document Reviewed: 02/25/2014 Elsevier Interactive Patient Education  2018 [employer] you healthy  Get these tests  Blood pressure- Have your blood pressure checked once a year by your healthcare provider.  Normal blood pressure is 120/80  Weight- Have your body mass index (BMI) calculated to screen for obesity.  BMI is a measure of body fat based on height and weight. You can also calculate your own BMI  at [employer].  Cholesterol- Have your cholesterol checked every year.  Diabetes- Have your blood sugar checked regularly if you have high blood pressure, high cholesterol, have a family history of diabetes or if you are overweight.  Screening for Colon Cancer- Colonoscopy starting at age 21.  Screening may begin sooner depending on your family history and other health conditions. Follow up colonoscopy as directed by your Gastroenterologist.  Screening for Prostate Cancer- Both blood work (PSA) and a rectal exam help screen for Prostate Cancer.  Screening begins at age 50 with African-American men and at age 65 with Caucasian men.  Screening may begin sooner depending on your family history.  Take these medicines  Aspirin- One aspirin daily can help prevent Heart disease and Stroke.  Flu shot- Every fall.  Tetanus- Every 10 years.  Zostavax- Once after the age of 29 to prevent Shingles.  Pneumonia shot- Once after the age of 60; if you are younger than 20, ask your healthcare provider if you need a Pneumonia shot.  Take these steps  Don't smoke- If you do smoke, talk to your doctor about quitting.  For tips on how to quit, go to www.smokefree.gov or call 1-800-QUIT-NOW.  Be physically active- Exercise 5 days a week for at least 30 minutes.  If  you are not already physically active start slow and gradually work up to 30 minutes of moderate physical activity.  Examples of moderate activity include walking briskly, mowing the yard, dancing, swimming, bicycling, etc.  Eat a healthy diet- Eat a variety of healthy food such as fruits, vegetables, low fat milk, low fat cheese, yogurt, lean meant, poultry, fish, beans, tofu, etc. For more information go to www.thenutritionsource.org  Drink alcohol in moderation- Limit alcohol intake to less than two drinks a day. Never drink and drive.  Dentist- Brush and floss twice daily; visit your dentist twice a year.  Depression- Your emotional health is as important as your physical health. If you're feeling down, or losing interest in things you would normally enjoy please talk to your healthcare provider.  Eye exam- Visit your eye doctor every year.  Safe sex- If you may be exposed to a sexually transmitted infection, use a condom.  Seat belts- Seat belts can save your life; always wear one.  Smoke/Carbon Monoxide detectors- These detectors need to be installed on the appropriate level of your home.  Replace batteries at least once a year.  Skin cancer- When out in the sun, cover up and use sunscreen 15 SPF or higher.  Violence- If anyone is threatening you, please tell your healthcare provider.  Living Will/ Health care power of attorney- Speak with your healthcare provider and family.  IF you received an x-ray today, you will receive an invoice from [facility]. Please contact [facility] at [phone] with questions or concerns regarding your invoice.   IF you received labwork today, you will receive an invoice from [facility]. Please contact [facility] at [phone] with questions or concerns regarding your invoice.   Our billing staff will not be able to assist you with questions regarding bills from these companies.  You will be contacted with the lab results as soon  as they are available. The fastest way to get your results is to activate your My Chart account. Instructions are located on the last page of this paperwork. If you have not heard from [place] regarding the results in 2 weeks, please contact this office.       I personally performed the  services described in this documentation, which was scribed in my presence. The recorded information has been reviewed and considered for accuracy and completeness, addended by me as needed, and agree with information above.  Signed,   [name], MD Primary [facility] at [place].  03/02/17 5:14 PM

## 2017-02-28 LAB — LIPID PANEL
CHOL/HDL RATIO: 3.3 ratio (ref 0.0–5.0)
CHOLESTEROL TOTAL: 211 mg/dL — AB (ref 100–199)
HDL: 63 mg/dL (ref >39)
LDL Calculated: 124 mg/dL — ABNORMAL HIGH (ref 0–99)
TRIGLYCERIDES: 119 mg/dL (ref 0–149)
VLDL Cholesterol Cal: 24 mg/dL (ref 5–40)

## 2017-02-28 LAB — COMPREHENSIVE METABOLIC PANEL
A/G RATIO: 1.5 (ref 1.2–2.2)
ALBUMIN: 4.6 g/dL (ref 3.5–5.5)
ALK PHOS: 133 IU/L — AB (ref 39–117)
ALT: 29 IU/L (ref 0–44)
AST: 22 IU/L (ref 0–40)
BILIRUBIN TOTAL: 0.4 mg/dL (ref 0.0–1.2)
BUN / CREAT RATIO: 34 — AB (ref 9–20)
BUN: 31 mg/dL — AB (ref 6–24)
CHLORIDE: 98 mmol/L (ref 96–106)
CO2: 24 mmol/L (ref 20–29)
Calcium: 8.9 mg/dL (ref 8.7–10.2)
Creatinine, Ser: 0.91 mg/dL (ref 0.76–1.27)
GFR calc Af Amer: 109 mL/min/{1.73_m2} (ref >59)
GFR calc non Af Amer: 94 mL/min/{1.73_m2} (ref >59)
GLUCOSE: 118 mg/dL — AB (ref 65–99)
Globulin, Total: 3.1 g/dL (ref 1.5–4.5)
POTASSIUM: 4.4 mmol/L (ref 3.5–5.2)
Sodium: 140 mmol/L (ref 134–144)
Total Protein: 7.7 g/dL (ref 6.0–8.5)

## 2017-02-28 LAB — HEMOGLOBIN A1C
Est. average glucose Bld gHb Est-mCnc: 174 mg/dL
HEMOGLOBIN A1C: 7.7 % — AB (ref 4.8–5.6)

## 2017-02-28 LAB — PSA: Prostate Specific Ag, Serum: 0.6 ng/mL (ref 0.0–4.0)

## 2017-04-23 ENCOUNTER — Encounter: Payer: Self-pay | Admitting: Family Medicine

## 2017-06-01 ENCOUNTER — Other Ambulatory Visit: Payer: Self-pay

## 2017-06-01 ENCOUNTER — Other Ambulatory Visit: Payer: Self-pay | Admitting: Family Medicine

## 2017-06-01 ENCOUNTER — Telehealth: Payer: Self-pay | Admitting: Family Medicine

## 2017-06-01 ENCOUNTER — Encounter: Payer: Self-pay | Admitting: Family Medicine

## 2017-06-01 ENCOUNTER — Ambulatory Visit: Payer: BC Managed Care – PPO | Admitting: Family Medicine

## 2017-06-01 VITALS — BP 138/82 | HR 76 | Temp 98.0°F | Resp 20 | Ht 67.87 in | Wt >= 6400 oz

## 2017-06-01 DIAGNOSIS — R609 Edema, unspecified: Secondary | ICD-10-CM

## 2017-06-01 DIAGNOSIS — N492 Inflammatory disorders of scrotum: Secondary | ICD-10-CM

## 2017-06-01 DIAGNOSIS — E785 Hyperlipidemia, unspecified: Secondary | ICD-10-CM | POA: Diagnosis not present

## 2017-06-01 DIAGNOSIS — R748 Abnormal levels of other serum enzymes: Secondary | ICD-10-CM | POA: Diagnosis not present

## 2017-06-01 DIAGNOSIS — E119 Type 2 diabetes mellitus without complications: Secondary | ICD-10-CM

## 2017-06-01 LAB — POCT URINALYSIS DIP (MANUAL ENTRY)
BILIRUBIN UA: NEGATIVE mg/dL
Bilirubin, UA: NEGATIVE
Blood, UA: NEGATIVE
Glucose, UA: NEGATIVE mg/dL
Leukocytes, UA: NEGATIVE
Nitrite, UA: NEGATIVE
PROTEIN UA: NEGATIVE mg/dL
Spec Grav, UA: 1.015 (ref 1.010–1.025)
UROBILINOGEN UA: 0.2 U/dL
pH, UA: 7 (ref 5.0–8.0)

## 2017-06-01 LAB — POC MICROSCOPIC URINALYSIS (UMFC): MUCUS RE: ABSENT

## 2017-06-01 MED ORDER — POTASSIUM CHLORIDE CRYS ER 20 MEQ PO TBCR: 20.0000 meq | EXTENDED_RELEASE_TABLET | Freq: Two times a day (BID) | ORAL | 1 refills | Status: DC

## 2017-06-01 MED ORDER — FUROSEMIDE 80 MG PO TABS: 80.0000 mg | ORAL_TABLET | Freq: Two times a day (BID) | ORAL | 1 refills | Status: DC

## 2017-06-01 NOTE — Patient Instructions (Addendum)
If A1c is still over 7, would recommend metformin.   Let me know if you change your mind about the statin.  I will check levels to see if there are any changes.   Depending on the ultrasound, can call in meds or follow up with urology. If any pain or new urinary symptoms, let me know.   No changes in meds for now, but if you continue to have cramps, we may need to adjust the lasix doses. I will check the electrolytes today.    IF you received an x-ray today, you will receive an invoice from [facility] Radiology. Please contact [facility] Radiology at [phone] with questions or concerns regarding your invoice.   IF you received labwork today, you will receive an invoice from [facility]. Please contact [facility] at [phone] with questions or concerns regarding your invoice.   Our billing staff will not be able to assist you with questions regarding bills from these companies.  You will be contacted with the lab results as soon as they are available. The fastest way to get your results is to activate your My Chart account. Instructions are located on the last page of this paperwork. If you have not heard from [place] regarding the results in 2 weeks, please contact this office.

## 2017-06-01 NOTE — Telephone Encounter (Signed)
Phone call to patient to call and advise regarding time for scrotal ultrasound. Per signed release, left detailed message stating his ultrasound exam is at 2:30pm at [facility] on Monday. Please return call to radiology at [phone] if appointment needs to be changed.

## 2017-06-01 NOTE — Progress Notes (Signed)
Subjective:    Patient ID: Henry Hinton, male    DOB: Dec 13, 1960, 57 y.o.   MRN: [id number]  HPI [name] is a 57 y.o. male Presents today for: Chief Complaint  Patient presents with  . Mass    X 1 week- left side of groin   history of multiple medical problems including chronic lymphocytic leukemia, prior abdominal wall cellulitis with sepsis requiring admission. Last admitted for abdominal wall cellulitis in September 2018. Cultures were negative at that time, improved on oral doxycycline and Keflex. Presents today with left groin issue. Noticed bump in scrotal sac1 week ago. Felt a marble sized area there. Not sore, no fever. No dysuria, still some urinary frequency with lasix use, but no changes. No hematuria. No redness to abdomen or other rash. No new sexual contacts, not currently sexually active. Denies discharge or history of sexually transmitted infections.  Saw hematologist 2 days ago. Had exam there - concern for epididymitis? No treatments as not painful.   Diabetes:  Lab Results  Component Value Date   HGBA1C 7.7 (H) 02/27/2017   Wt Readings from Last 3 Encounters:  06/01/17 (!) 405 lb 3.2 oz (183.8 kg)  02/27/17 (!) 403 lb (182.8 kg)  01/22/17 (!) 404 lb (183.3 kg)  discussed metformin prior - he defers starting the medication initially. Has fluctuated form 397 to 405 Also with elevated lipids as below, email in December-  plan to recheck levels in 3-6 months.  Elevated Alk Phos: See prior labs, plan to recheck.  Lab Results  Component Value Date   ALT 29 02/27/2017   AST 22 02/27/2017   ALKPHOS 133 (H) 02/27/2017   BILITOT 0.4 02/27/2017   Hyperlipidemia: Has changed diet, but will not take statin - ok to recheck levels.  Lab Results  Component Value Date   CHOL 211 (H) 02/27/2017   HDL 63 02/27/2017   LDLCALC 124 (H) 02/27/2017   TRIG 119 02/27/2017   CHOLHDL 3.3 02/27/2017  The 10-year ASCVD risk score Mikey Bussing DC Jr., et al., 2013) is: 13.3%   Values used to calculate the score:     Age: 71 years     Sex: Male     Is Non-Hispanic African American: No     Diabetic: Yes     Tobacco smoker: No     Systolic Blood Pressure: 664 mmHg     Is BP treated: Yes     HDL Cholesterol: 63 mg/dL     Total Cholesterol: 211 mg/dL  On lasix and potassium for pedal edema. Still has to hydrate at night to prevent cramps. Swelling improves with swelling., has cut back on sodium - under 2512m per day. Seen by nephrology prior, [facility]   Patient Active Problem List   Diagnosis Date Noted  . Abdominal wall cellulitis 01/22/2017  . Insect bite 01/22/2017  . Hyperkalemia 01/22/2017  . OSA (obstructive sleep apnea) 01/22/2017  . Essential hypertension   . Hoarseness 06/08/2016  . Cellulitis 11/28/2015  . Cellulitis of trunk 11/27/2015  . Nonspecific abnormal finding in stool contents   . Colon cancer screening   . Venous stasis   . History of DVT (deep vein thrombosis)   . Sepsis ([place] 10/09/2015  . Panniculitis 10/09/2015  . CLL (chronic lymphocytic leukemia) ([place] 05/27/2015  . 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 Leukocytosis (leucocytosis) 08/29/2014  . HNP (herniated nucleus pulposus), cervical 01/21/2013  . bilat hip replacement 04/08/2012  .  Morbid obesity ([place]) 04/08/2012  . Edema 04/08/2012   Past Medical History:  Diagnosis Date  . Anemia   . Arthritis   . BMI 50.0-59.9, adult ([place])   . Cellulitis 11/2015   trunk  . cll 11/2014   CLL - 11/30/2014- Dr. [facility] [place], [place]  . DVT (deep venous thrombosis) ([place]) 6/16   Rt calf  took xarelto for 6 months  . Family history of anesthesia complication    " father having delirium after anesthesia"  . Heart murmur    Hx; of as a child  . History of kidney stones    x2  . Joint pain    Hx: of periodically  . Numbness and tingling    Hx: of in right arm  . Pneumonia   . Sleep apnea    Cpap use- settings 10   Past  Surgical History:  Procedure Laterality Date  . BACK SURGERY    . COLONOSCOPY     Hx: of  . COLONOSCOPY N/A 11/23/2015   Procedure: COLONOSCOPY;  Surgeon: Irene Shipper, MD;  Location: [facility];  Service: Endoscopy;  Laterality: N/A;  . INGUINAL HERNIA REPAIR Left 06/01/2015   Procedure: LEFT INGUINAL HERNIA REPAIR WITH MESH;  Surgeon: Autumn Messing III, MD;  Location: [facility] ORS;  Service: General;  Laterality: Left;  . INGUINAL HERNIA REPAIR    . INSERTION OF MESH Left 06/01/2015   Procedure: INSERTION OF MESH;  Surgeon: Autumn Messing III, MD;  Location: [facility] ORS;  Service: General;  Laterality: Left;  . JOINT REPLACEMENT Bilateral    BTHA  . KNEE ARTHROSCOPY Left 07/21/2014   Dr. Noemi Chapel  . LUMBAR LAMINECTOMY  2005  . POSTERIOR CERVICAL LAMINECTOMY WITH MET- RX Right 01/21/2013   Procedure: Right Sitting C6-7 Microdiskectomy with Met-rex;  Surgeon: Kristeen Miss, MD;  Location: [facility] OR[facility];  Service: Neurosurgery;  Laterality: Right;  Right Sitting C6-7 Microdiskectomy with Met-rex  . STENT PLACEMENT RT URETER ([place] HX)  10/23/2014  . tenosynovitis  2000   Hx: of thumb  . TONSILLECTOMY    . TOTAL HIP ARTHROPLASTY Bilateral   . WISDOM TOOTH EXTRACTION  1974   all 4   Allergies  Allergen Reactions  . Adhesive [Tape] Other (See Comments)    Blisters and rash at site   Prior to Admission medications   Medication Sig Start Date End Date Taking? Authorizing Provider  aspirin EC 81 MG tablet Take 81 mg by mouth daily.   Yes [provider]  ferrous sulfate (IRON SUPPLEMENT) 325 (65 FE) MG tablet Take 325 mg by mouth daily with breakfast.   Yes [provider]  furosemide (LASIX) 80 MG tablet Take 80 mg by mouth 2 (two) times daily.   Yes [provider]  ibuprofen (ADVIL,MOTRIN) 200 MG tablet Take 800 mg by mouth every 6 (six) hours as needed for moderate pain.    Yes [provider]  Multiple Vitamins-Minerals (MULTIVITAMIN WITH MINERALS) tablet Take 1 tablet by  mouth daily.   Yes [provider]  potassium chloride SA (K-DUR,KLOR-CON) 20 MEQ tablet Take 20 mEq by mouth 2 (two) times daily.   Yes [provider]   Social History   Socioeconomic History  . Marital status: Divorced    Spouse name: Not on file  . Number of children: 0  . Years of education: Not on file  . Highest education level: Not on file  Social Needs  . Financial resource strain: Not on file  .  Food insecurity - worry: Not on file  . Food insecurity - inability: Not on file  . Transportation needs - medical: Not on file  . Transportation needs - non-medical: Not on file  Occupational History  . Occupation: Geophysical data processor: [employer]  Tobacco Use  . Smoking status: Never Smoker  . Smokeless tobacco: Former Systems developer    Types: Chew  Substance and Sexual Activity  . Alcohol use: Yes    Alcohol/week: 0.6 oz    Types: 1 Standard drinks or equivalent per week    Comment: [employer]  . Drug use: No  . Sexual activity: Not Currently  Other Topics Concern  . Not on file  Social History Narrative   Exercise swimming 3-5 times/week for 45 minutes    Review of Systems As above.     Objective:   Physical Exam  Constitutional: He is oriented to person, place, and time. He appears well-developed and well-nourished.  HENT:  Head: Normocephalic and atraumatic.  Eyes: EOM are normal. Pupils are equal, round, and reactive to light.  Neck: No JVD present. Carotid bruit is not present.  Cardiovascular: Normal rate, regular rhythm and normal heart sounds.  No murmur heard. Pulmonary/Chest: Effort normal and breath sounds normal. He has no rales.  Genitourinary:  Genitourinary Comments: 1.5-2cm firm nodular area distal to left testicle. Skin intact without erythema or induration Nontender, epididymis does not appear to be tender. No apparent hernia, but somewhat difficult exam with body habitus.  Musculoskeletal: He exhibits  edema (Hyperpigmentation with 1-2+ edema at lower legs bilaterally. No wound/ulcer).  Neurological: He is alert and oriented to person, place, and time.  Skin: Skin is warm and dry.  Psychiatric: He has a normal mood and affect.  Vitals reviewed.  Vitals:   06/01/17 1444  BP: 138/82  Pulse: 76  Resp: 20  Temp: 98 F (36.7 C)  TempSrc: Oral  SpO2: 96%  Weight: (!) 405 lb 3.2 oz (183.8 kg)  Height: 5' 7.87" (1.724 m)       Assessment & Plan:   Langdon Crosson is a 57 y.o. male Scrotal nodule - Plan: POCT urinalysis dipstick, POCT Microscopic Urinalysis ([facility]), US SCROTUM W/DOPPLER, CANCELED: US Scrotum, CANCELED: US PELVIC DOPPLER (TORSION R/O OR MASS ARTERIAL FLOW), CANCELED: US SCROTUM W/DOPPLER  - Nontender, hold on antibiotics at this time. Schedule ultrasound with Doppler to further evaluate, then possible urology eval  Type 2 diabetes mellitus without complication, without long-term current use of insulin ([place]) - Plan: Hemoglobin A1c, Microalbumin, urine  -Discussed recommendation for metformin. He would prefer not to take medication at this point. Will recheck A1c, urine microalbumin, then discuss further recommendations at that time.  Elevated alkaline phosphatase level - Plan: Comprehensive metabolic panel  -Repeat CMP  Hyperlipidemia, unspecified hyperlipidemia type - Plan: Lipid panel  -Statin recommendation discussed, especially with diabetes. He would like to recheck lipids, but still would decline statins at this time.  Peripheral edema - Plan: furosemide (LASIX) 80 MG tablet, potassium chloride SA (K-DUR,KLOR-CON) 20 MEQ tablet  -Refilled Lasix and potassium at same doses as prescribed by nephrology. However if persistent swelling, may need to evaluate fluid intake and furosemide doses.  Meds ordered this encounter  Medications  . furosemide (LASIX) 80 MG tablet    Sig: Take 1 tablet (80 mg total) by mouth 2 (two) times daily.    Dispense:  180 tablet     Refill:  1  . potassium chloride SA (  K-DUR,KLOR-CON) 20 MEQ tablet    Sig: Take 1 tablet (20 mEq total) by mouth 2 (two) times daily.    Dispense:  180 tablet    Refill:  1   Patient Instructions   If A1c is still over 7, would recommend metformin.   Let me know if you change your mind about the statin.  I will check levels to see if there are any changes.   Depending on the ultrasound, can call in meds or follow up with urology. If any pain or new urinary symptoms, let me know.   No changes in meds for now, but if you continue to have cramps, we may need to adjust the lasix doses. I will check the electrolytes today.    IF you received an x-ray today, you will receive an invoice from [facility] Radiology. Please contact [facility] at [phone] with questions or concerns regarding your invoice.   IF you received labwork today, you will receive an invoice from [facility]. Please contact [facility] at [phone] with questions or concerns regarding your invoice.   Our billing staff will not be able to assist you with questions regarding bills from these companies.  You will be contacted with the lab results as soon as they are available. The fastest way to get your results is to activate your My Chart account. Instructions are located on the last page of this paperwork. If you have not heard from [place] regarding the results in 2 weeks, please contact this office.      Signed,   [name], MD [facility].  06/02/17 4:27 PM

## 2017-06-02 ENCOUNTER — Encounter: Payer: Self-pay | Admitting: Family Medicine

## 2017-06-02 LAB — COMPREHENSIVE METABOLIC PANEL
A/G RATIO: 1.5 (ref 1.2–2.2)
ALK PHOS: 128 IU/L — AB (ref 39–117)
ALT: 29 IU/L (ref 0–44)
AST: 20 IU/L (ref 0–40)
Albumin: 4.3 g/dL (ref 3.5–5.5)
BUN/Creatinine Ratio: 22 — ABNORMAL HIGH (ref 9–20)
BUN: 17 mg/dL (ref 6–24)
Bilirubin Total: 0.4 mg/dL (ref 0.0–1.2)
CHLORIDE: 99 mmol/L (ref 96–106)
CO2: 26 mmol/L (ref 20–29)
Calcium: 8.8 mg/dL (ref 8.7–10.2)
Creatinine, Ser: 0.78 mg/dL (ref 0.76–1.27)
GFR calc Af Amer: 117 mL/min/{1.73_m2} (ref >59)
GFR calc non Af Amer: 101 mL/min/{1.73_m2} (ref >59)
GLOBULIN, TOTAL: 2.8 g/dL (ref 1.5–4.5)
Glucose: 138 mg/dL — ABNORMAL HIGH (ref 65–99)
POTASSIUM: 4.4 mmol/L (ref 3.5–5.2)
SODIUM: 142 mmol/L (ref 134–144)
Total Protein: 7.1 g/dL (ref 6.0–8.5)

## 2017-06-02 LAB — LIPID PANEL
CHOL/HDL RATIO: 3.6 ratio (ref 0.0–5.0)
Cholesterol, Total: 193 mg/dL (ref 100–199)
HDL: 54 mg/dL (ref >39)
LDL CALC: 120 mg/dL — AB (ref 0–99)
Triglycerides: 94 mg/dL (ref 0–149)
VLDL CHOLESTEROL CAL: 19 mg/dL (ref 5–40)

## 2017-06-02 LAB — HEMOGLOBIN A1C
Est. average glucose Bld gHb Est-mCnc: 186 mg/dL
HEMOGLOBIN A1C: 8.1 % — AB (ref 4.8–5.6)

## 2017-06-02 LAB — MICROALBUMIN, URINE

## 2017-06-04 ENCOUNTER — Ambulatory Visit (HOSPITAL_COMMUNITY)
Admission: RE | Admit: 2017-06-04 | Discharge: 2017-06-04 | Disposition: A | Discharge status: Home / Self Care | Payer: BC Managed Care – PPO | Source: Ambulatory Visit | Attending: Family Medicine | Admitting: Family Medicine

## 2017-06-04 DIAGNOSIS — N492 Inflammatory disorders of scrotum: Secondary | ICD-10-CM | POA: Diagnosis not present

## 2017-06-04 DIAGNOSIS — N433 Hydrocele, unspecified: Secondary | ICD-10-CM | POA: Insufficient documentation

## 2017-06-04 DIAGNOSIS — N503 Cyst of epididymis: Secondary | ICD-10-CM | POA: Insufficient documentation

## 2017-06-06 ENCOUNTER — Encounter: Payer: Self-pay | Admitting: Family Medicine

## 2017-08-01 IMAGING — DX DG CHEST 2V
3 series · 3 of 3 positions shown · non-contrast
Comparison: 04/25/2014

CLINICAL DATA: Acute upper respiratory tract infection.

EXAM:
CHEST  2 VIEW

[chest pa (1 of 2)]
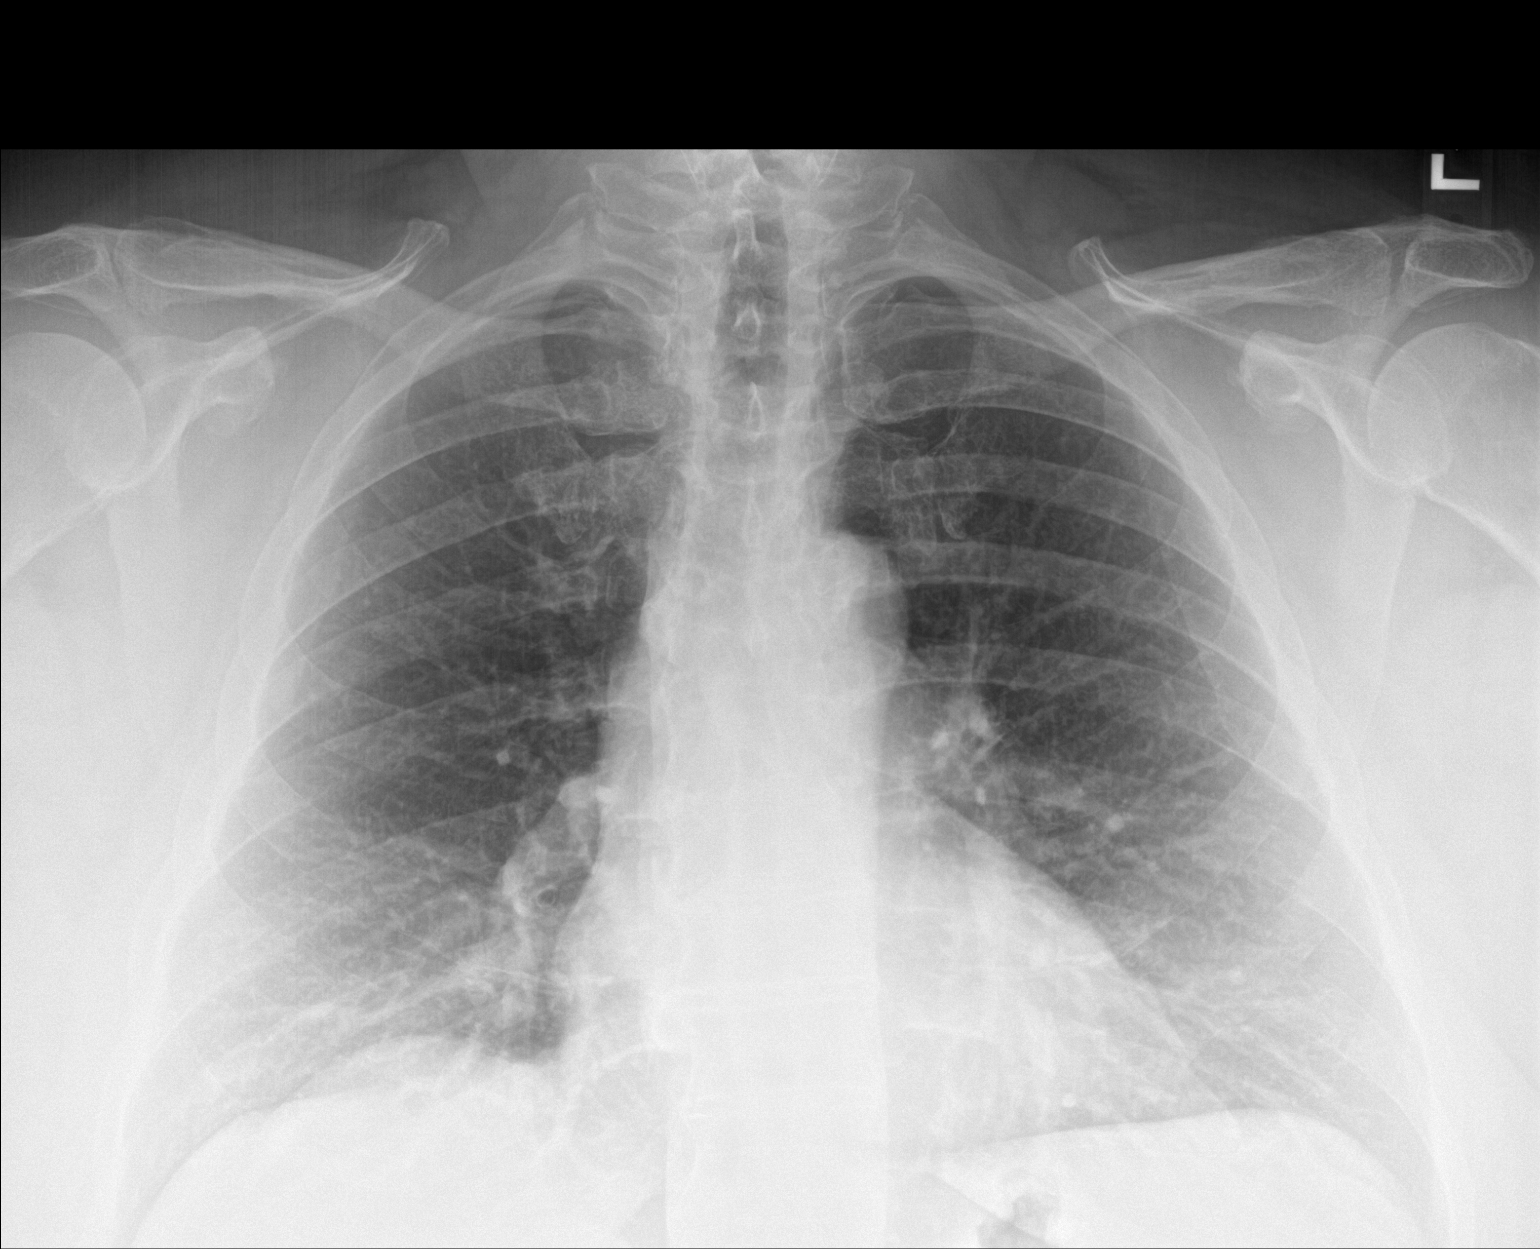

[chest lat]
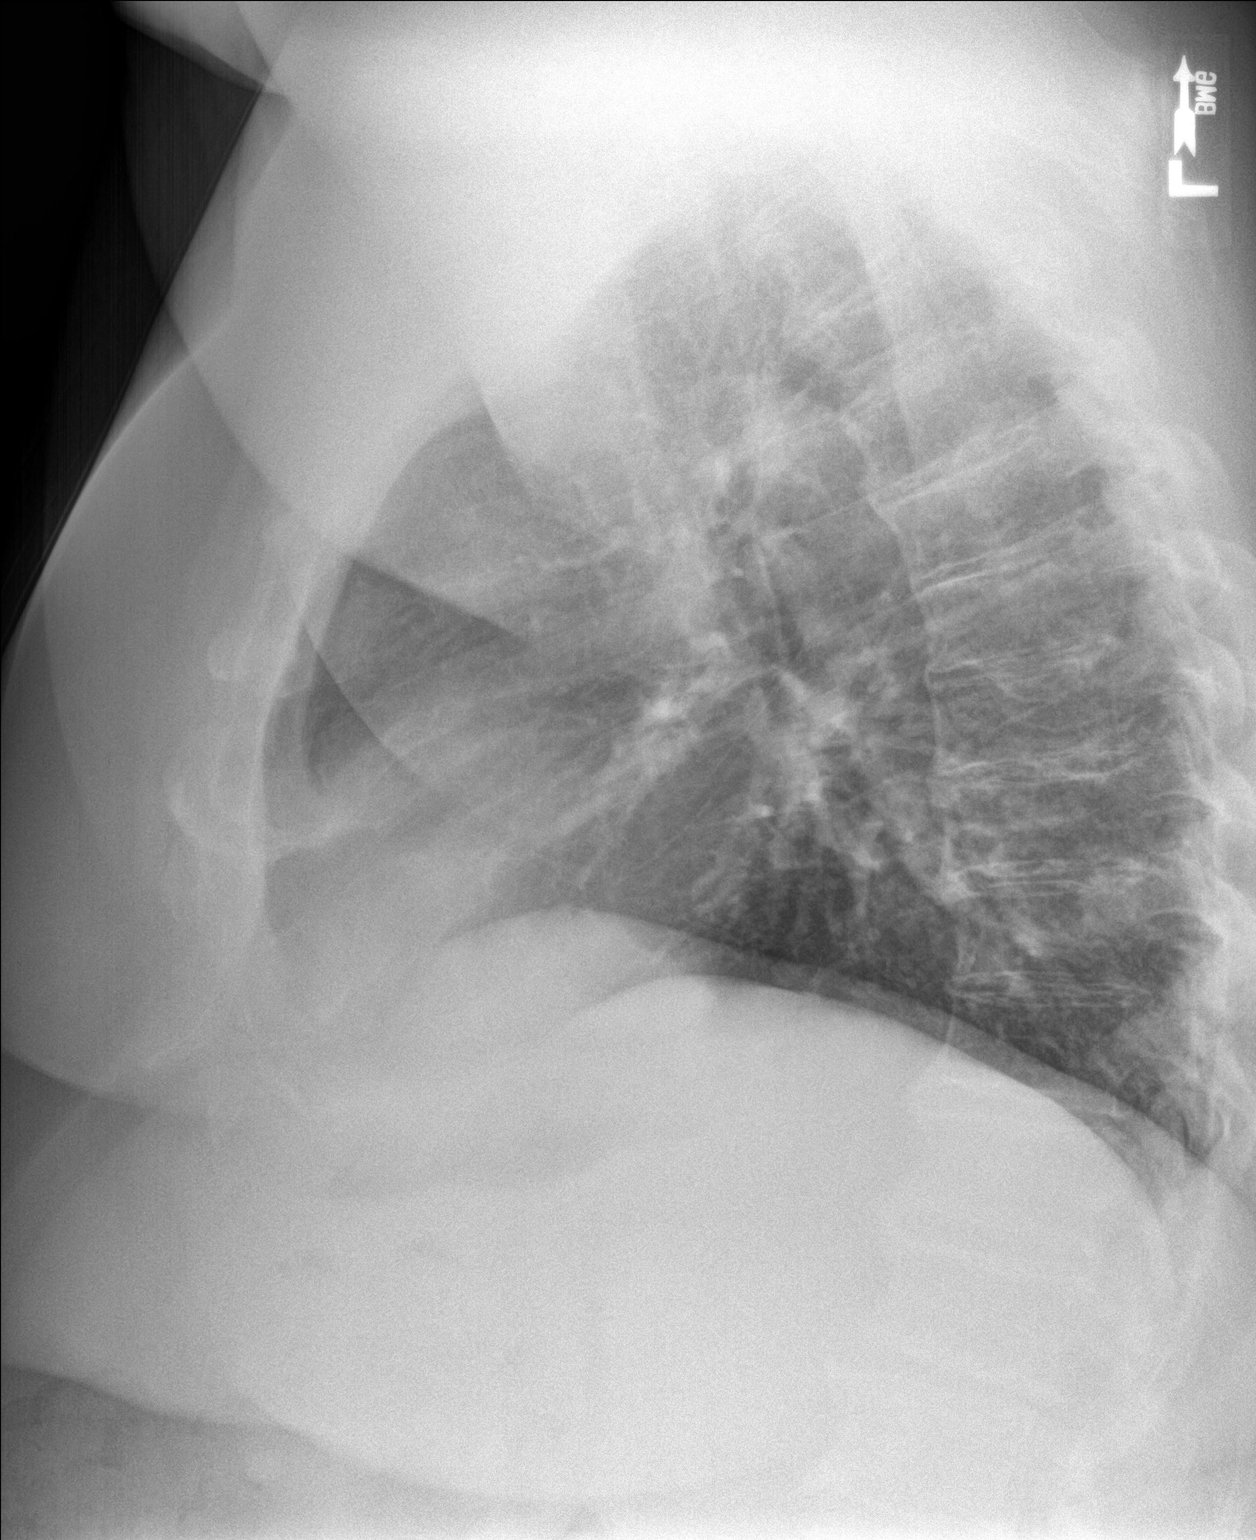

[chest pa (2 of 2)]
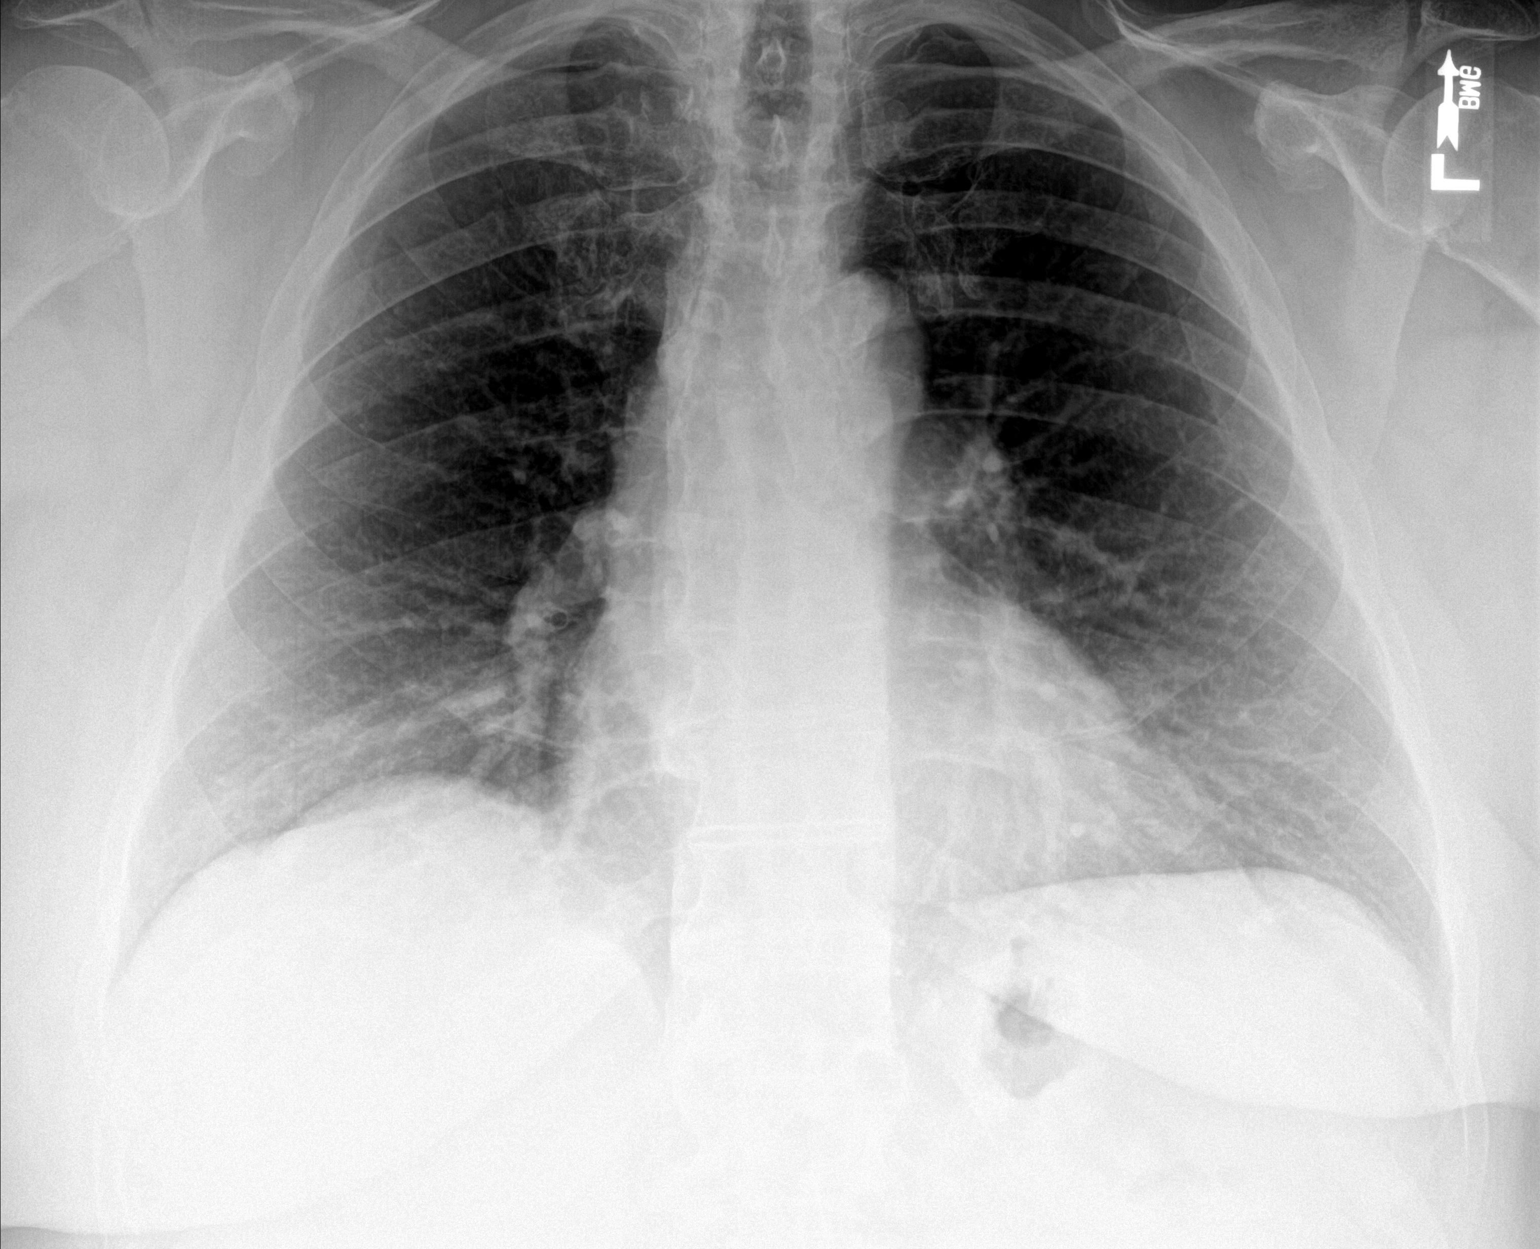

[3 of 3 positions shown; findings below may reference images not displayed]

FINDINGS: Mildly motion and patient size degraded lateral view. Midline
trachea. Normal heart size and mediastinal contours. No pleural
effusion or pneumothorax. Clear lungs.
IMPRESSION: No acute cardiopulmonary disease.

## 2017-08-19 ENCOUNTER — Encounter (HOSPITAL_COMMUNITY): Payer: Self-pay | Admitting: *Deleted

## 2017-08-19 DIAGNOSIS — R509 Fever, unspecified: Secondary | ICD-10-CM | POA: Diagnosis present

## 2017-08-19 DIAGNOSIS — Z5321 Procedure and treatment not carried out due to patient leaving prior to being seen by health care provider: Secondary | ICD-10-CM | POA: Diagnosis not present

## 2017-08-19 NOTE — ED Triage Notes (Signed)
Pt stated "began having fever, chills, n/v/d since yesterday.  Last took tylenol @ 1900."

## 2017-08-20 ENCOUNTER — Emergency Department (HOSPITAL_COMMUNITY)
Admission: EM | Admit: 2017-08-20 | Discharge: 2017-08-20 | Discharge status: Against Medical Advice | Payer: BC Managed Care – PPO | Attending: Emergency Medicine | Admitting: Emergency Medicine

## 2017-09-20 ENCOUNTER — Ambulatory Visit: Payer: BC Managed Care – PPO | Admitting: Urgent Care

## 2017-09-20 ENCOUNTER — Ambulatory Visit (INDEPENDENT_AMBULATORY_CARE_PROVIDER_SITE_OTHER): Payer: BC Managed Care – PPO

## 2017-09-20 ENCOUNTER — Encounter: Payer: Self-pay | Admitting: Urgent Care

## 2017-09-20 VITALS — BP 146/78 | HR 93 | Temp 102.6°F | Resp 16 | Ht 69.0 in | Wt >= 6400 oz

## 2017-09-20 DIAGNOSIS — R05 Cough: Secondary | ICD-10-CM | POA: Diagnosis not present

## 2017-09-20 DIAGNOSIS — R509 Fever, unspecified: Secondary | ICD-10-CM

## 2017-09-20 DIAGNOSIS — R0789 Other chest pain: Secondary | ICD-10-CM | POA: Diagnosis not present

## 2017-09-20 DIAGNOSIS — J101 Influenza due to other identified influenza virus with other respiratory manifestations: Secondary | ICD-10-CM | POA: Diagnosis not present

## 2017-09-20 DIAGNOSIS — R059 Cough, unspecified: Secondary | ICD-10-CM

## 2017-09-20 DIAGNOSIS — C9190 Lymphoid leukemia, unspecified not having achieved remission: Secondary | ICD-10-CM

## 2017-09-20 DIAGNOSIS — R5381 Other malaise: Secondary | ICD-10-CM | POA: Diagnosis not present

## 2017-09-20 DIAGNOSIS — R5383 Other fatigue: Secondary | ICD-10-CM | POA: Diagnosis not present

## 2017-09-20 DIAGNOSIS — C9110 Chronic lymphocytic leukemia of B-cell type not having achieved remission: Secondary | ICD-10-CM

## 2017-09-20 LAB — POCT CBC
Granulocyte percent: 21.5 %G — AB (ref 37–80)
HCT, POC: 37.3 % — AB (ref 43.5–53.7)
Hemoglobin: 12.2 g/dL — AB (ref 14.1–18.1)
LYMPH, POC: 47.3 — AB (ref 0.6–3.4)
MCH: 27.5 pg (ref 27–31.2)
MCHC: 32.8 g/dL (ref 31.8–35.4)
MCV: 83.8 fL (ref 80–97)
MID (CBC): 3.6 — AB (ref 0–0.9)
MPV: 8.2 fL (ref 0–99.8)
PLATELET COUNT, POC: 143 10*3/uL (ref 142–424)
POC Granulocyte: 14 — AB (ref 2–6.9)
POC LYMPH %: 72.9 % — AB (ref 10–50)
POC MID %: 5.6 % (ref 0–12)
RBC: 4.44 M/uL — AB (ref 4.69–6.13)
RDW, POC: 16.5 %
WBC: 64.9 10*3/uL — AB (ref 4.6–10.2)

## 2017-09-20 LAB — POCT RAPID STREP A (OFFICE): Rapid Strep A Screen: NEGATIVE

## 2017-09-20 LAB — POC INFLUENZA A&B (BINAX/QUICKVUE)
INFLUENZA A, POC: POSITIVE — AB
INFLUENZA B, POC: NEGATIVE

## 2017-09-20 MED ORDER — BENZONATATE 100 MG PO CAPS: 100.0000 mg | ORAL_CAPSULE | Freq: Three times a day (TID) | ORAL | 0 refills | Status: DC | PRN

## 2017-09-20 MED ORDER — OSELTAMIVIR PHOSPHATE 75 MG PO CAPS: 75.0000 mg | ORAL_CAPSULE | Freq: Two times a day (BID) | ORAL | 0 refills | Status: DC

## 2017-09-20 MED ORDER — HYDROCODONE-HOMATROPINE 5-1.5 MG/5ML PO SYRP: 5.0000 mL | ORAL_SOLUTION | Freq: Every evening | ORAL | 0 refills | Status: DC | PRN

## 2017-09-20 NOTE — Progress Notes (Signed)
MRN: [id number] DOB: 1960/11/27  Subjective:   Henry Hinton is a 57 y.o. male presenting for 4 day history of worsening productive cough now having chest pain, fatigue, lethargic. He is having a difficult time laying down, sweating episodes, right ear pain, stuffy nose, headache. Has also had fever, highest was 102.58F. Has had 1 episode of vomiting. Has been trying ibuprofen. Denies smoking cigarettes. Patient has a history of CLL, last OV was 07/2017. He is [facility] by [facility]. Has a history of dvt 2016.  Henry Hinton has a current medication list which includes the following prescription(s): aspirin ec, ferrous sulfate, furosemide, multivitamin with minerals, and potassium chloride sa. Also is allergic to adhesive [tape].  Henry Hinton  has a past medical history of Anemia, Arthritis, BMI 50.0-59.9, adult ([place]), Cellulitis (11/2015), cll (11/2014), DVT (deep venous thrombosis) ([place]) (6/16), Family history of anesthesia complication, Heart murmur, History of kidney stones, Joint pain, Numbness and tingling, Pneumonia, and Sleep apnea. Also  has a past surgical history that includes Lumbar laminectomy (2005); Total hip arthroplasty (Bilateral); tenosynovitis (2000); Tonsillectomy; Colonoscopy; Posterior cervical laminectomy with met- rx (Right, 01/21/2013); STENT PLACEMENT RT URETER ([facility] HX) (10/23/2014); Wisdom tooth extraction (1974); Inguinal hernia repair (Left, 06/01/2015); Insertion of mesh (Left, 06/01/2015); Colonoscopy (N/A, 11/23/2015); Back surgery; Knee arthroscopy (Left, 07/21/2014); Joint replacement (Bilateral); and Inguinal hernia repair.  Objective:   Vitals: BP (!) 146/78   Pulse 93   Temp (!) 102.6 F (39.2 C) (Oral)   Resp 16   Ht '5\' 9"'  (1.753 m)   Wt (!) 400 lb (181.4 kg)   SpO2 99%   BMI 59.07 kg/m   Physical Exam  Constitutional: He is oriented to person, place, and time. He appears well-developed and well-nourished.  HENT:  Right Ear: Tympanic membrane normal.  Left Ear:  Tympanic membrane normal.  Nose: No mucosal edema, rhinorrhea or sinus tenderness.  Mouth/Throat: Posterior oropharyngeal erythema present.  Eyes: Right eye exhibits no discharge. Left eye exhibits no discharge.  Neck: Normal range of motion. Neck supple.  Cardiovascular: Normal rate, regular rhythm and intact distal pulses. Exam reveals no gallop and no friction rub.  No murmur heard. Pulmonary/Chest: No respiratory distress. He has no wheezes. He has no rales.  Lymphadenopathy:    He has no cervical adenopathy.  Neurological: He is alert and oriented to person, place, and time.  Skin: Skin is warm and dry.  Psychiatric: He has a normal mood and affect.    Dg Chest 2 View  Result Date: 09/20/2017 CLINICAL DATA:  Cough, fever. EXAM: CHEST - 2 VIEW COMPARISON:  Radiographs of June 23, 2016. FINDINGS: The heart size and mediastinal contours are within normal limits. Both lungs are clear. No pneumothorax or pleural effusion is noted. The visualized skeletal structures are unremarkable. IMPRESSION: No active cardiopulmonary disease. Electronically Signed   By: Marijo Conception, M.D.   On: 09/20/2017 15:54    Results for orders placed or performed in visit on 09/20/17 (from the past 24 hour(s))  POCT CBC     Status: Abnormal   Collection Time: 09/20/17  4:07 PM  Result Value Ref Range   WBC 64.9 (A) 4.6 - 10.2 K/uL   Lymph, poc 47.3 (A) 0.6 - 3.4   POC LYMPH PERCENT 72.9 (A) 10 - 50 %L   MID (cbc) 3.6 (A) 0 - 0.9   POC MID % 5.6 0 - 12 %M   POC Granulocyte 14.0 (A) 2 - 6.9   Granulocyte percent 21.5 (A) 37 -  80 %G   RBC 4.44 (A) 4.69 - 6.13 M/uL   Hemoglobin 12.2 (A) 14.1 - 18.1 g/dL   HCT, POC 37.3 (A) 43.5 - 53.7 %   MCV 83.8 80 - 97 fL   MCH, POC 27.5 27 - 31.2 pg   MCHC 32.8 31.8 - 35.4 g/dL   RDW, POC 16.5 %   Platelet Count, POC 143 142 - 424 K/uL   MPV 8.2 0 - 99.8 fL  POCT rapid strep A     Status: None   Collection Time: 09/20/17  4:30 PM  Result Value Ref Range    Rapid Strep A Screen Negative Negative  POC Influenza A&B(BINAX/QUICKVUE)     Status: Abnormal   Collection Time: 09/20/17  4:48 PM  Result Value Ref Range   Influenza A, POC Positive (A) Negative   Influenza B, POC Negative Negative     Assessment and Plan :   Influenza A  Cough - Plan: DG Chest 2 View, POCT CBC  Fever, unspecified - Plan: DG Chest 2 View, POCT CBC, POCT rapid strep A, POC Influenza A&B(BINAX/QUICKVUE), Culture, Group A Strep  Atypical chest pain  Other fatigue  Malaise  CLL (chronic lymphocytic leukemia) ([place])  Precepted case with Dr. Carlota Hinton.  Patient has high risk and we agreed to start Tamiflu.  Patient is to schedule Tylenol and ibuprofen, hydrate well and rest.  Offered cough suppression medications including Hycodan and Tessalon capsules.  Patient is to return to clinic no later than 09/22/2017 to see Dr. Nolon Hinton if he has no improvement.  Henry Eagles, PA-C [facility] [phone] 09/20/2017  3:32 PM

## 2017-09-20 NOTE — Patient Instructions (Addendum)
You may take 500mg  Tylenol with ibuprofen 600mg  every 6 hours for pain and inflammation. For sore throat try using a honey-based tea. Use 3 teaspoons of honey with juice squeezed from half lemon. Place shaved pieces of ginger into 1/2-1 cup of water and warm over stove top. Then mix the ingredients and repeat every 4 hours as needed. If you are not having any improvement by Saturday, then come back to our clinic and see Dr. Nolon Rod.     Influenza, Adult Influenza, more commonly known as "the flu," is a viral infection that primarily affects the respiratory tract. The respiratory tract includes organs that help you breathe, such as the lungs, nose, and throat. The flu causes many common cold symptoms, as well as a high fever and body aches. The flu spreads easily from person to person (is contagious). Getting a flu shot (influenza vaccination) every year is the best way to prevent influenza. What are the causes? Influenza is caused by a virus. You can catch the virus by:  Breathing in droplets from an infected person's cough or sneeze.  Touching something that was recently contaminated with the virus and then touching your mouth, nose, or eyes.  What increases the risk? The following factors may make you more likely to get the flu:  Not cleaning your hands frequently with soap and water or alcohol-based hand sanitizer.  Having close contact with many people during cold and flu season.  Touching your mouth, eyes, or nose without washing or sanitizing your hands first.  Not drinking enough fluids or not eating a healthy diet.  Not getting enough sleep or exercise.  Being under a high amount of stress.  Not getting a yearly (annual) flu shot.  You may be at a higher risk of complications from the flu, such as a severe lung infection (pneumonia), if you:  Are over the age of 34.  Are pregnant.  Have a weakened disease-fighting system (immune system). You may have a weakened immune  system if you: ? Have HIV or AIDS. ? Are undergoing chemotherapy. ? Aretaking medicines that reduce the activity of (suppress) the immune system.  Have a long-term (chronic) illness, such as heart disease, kidney disease, diabetes, or lung disease.  Have a liver disorder.  Are obese.  Have anemia.  What are the signs or symptoms? Symptoms of this condition typically last 4-10 days and may include:  Fever.  Chills.  Headache, body aches, or muscle aches.  Sore throat.  Cough.  Runny or congested nose.  Chest discomfort and cough.  Poor appetite.  Weakness or tiredness (fatigue).  Dizziness.  Nausea or vomiting.  How is this diagnosed? This condition may be diagnosed based on your medical history and a physical exam. Your health care provider may do a nose or throat swab test to confirm the diagnosis. How is this treated? If influenza is detected early, you can be treated with antiviral medicine that can reduce the length of your illness and the severity of your symptoms. This medicine may be given by mouth (orally) or through an IV tube that is inserted in one of your veins. The goal of treatment is to relieve symptoms by taking care of yourself at home. This may include taking over-the-counter medicines, drinking plenty of fluids, and adding humidity to the air in your home. In some cases, influenza goes away on its own. Severe influenza or complications from influenza may be treated in a hospital. Follow these instructions at home:  Take over-the-counter  and prescription medicines only as told by your health care provider.  Use a cool mist humidifier to add humidity to the air in your home. This can make breathing easier.  Rest as needed.  Drink enough fluid to keep your urine clear or pale yellow.  Cover your mouth and nose when you cough or sneeze.  Wash your hands with soap and water often, especially after you cough or sneeze. If soap and water are not  available, use hand sanitizer.  Stay home from work or school as told by your health care provider. Unless you are visiting your health care provider, try to avoid leaving home until your fever has been gone for 24 hours without the use of medicine.  Keep all follow-up visits as told by your health care provider. This is important. How is this prevented?  Getting an annual flu shot is the best way to avoid getting the flu. You may get the flu shot in late summer, fall, or winter. Ask your health care provider when you should get your flu shot.  Wash your hands often or use hand sanitizer often.  Avoid contact with people who are sick during cold and flu season.  Eat a healthy diet, drink plenty of fluids, get enough sleep, and exercise regularly. Contact a health care provider if:  You develop new symptoms.  You have: ? Chest pain. ? Diarrhea. ? A fever.  Your cough gets worse.  You produce more mucus.  You feel nauseous or you vomit. Get help right away if:  You develop shortness of breath or difficulty breathing.  Your skin or nails turn a bluish color.  You have severe pain or stiffness in your neck.  You develop a sudden headache or sudden pain in your face or ear.  You cannot stop vomiting. This information is not intended to replace advice given to you by your health care provider. Make sure you discuss any questions you have with your health care provider. Document Released: 04/14/2000 Document Revised: 09/23/2015 Document Reviewed: 02/09/2015 Elsevier Interactive Patient Education  2017 [employer].     IF you received an x-ray today, you will receive an invoice from [facility]. Please contact [facility] at [phone] with questions or concerns regarding your invoice.   IF you received labwork today, you will receive an invoice from [facility]. Please contact [facility] at [phone] with questions or concerns regarding your invoice.    Our billing staff will not be able to assist you with questions regarding bills from these companies.  You will be contacted with the lab results as soon as they are available. The fastest way to get your results is to activate your My Chart account. Instructions are located on the last page of this paperwork. If you have not heard from [place] regarding the results in 2 weeks, please contact this office.

## 2017-09-22 ENCOUNTER — Observation Stay (HOSPITAL_COMMUNITY)
Admission: EM | Admit: 2017-09-22 | Discharge: 2017-09-23 | Disposition: A | Discharge status: Home / Self Care | Payer: BC Managed Care – PPO | Attending: Student in an Organized Health Care Education/Training Program | Admitting: Student in an Organized Health Care Education/Training Program

## 2017-09-22 ENCOUNTER — Encounter (HOSPITAL_COMMUNITY): Payer: Self-pay | Admitting: Emergency Medicine

## 2017-09-22 ENCOUNTER — Emergency Department (HOSPITAL_COMMUNITY): Payer: BC Managed Care – PPO

## 2017-09-22 DIAGNOSIS — G4733 Obstructive sleep apnea (adult) (pediatric): Secondary | ICD-10-CM | POA: Diagnosis not present

## 2017-09-22 DIAGNOSIS — R000 Tachycardia, unspecified: Secondary | ICD-10-CM | POA: Diagnosis not present

## 2017-09-22 DIAGNOSIS — Z9989 Dependence on other enabling machines and devices: Secondary | ICD-10-CM | POA: Diagnosis not present

## 2017-09-22 DIAGNOSIS — Z888 Allergy status to other drugs, medicaments and biological substances status: Secondary | ICD-10-CM | POA: Diagnosis not present

## 2017-09-22 DIAGNOSIS — Z86718 Personal history of other venous thrombosis and embolism: Secondary | ICD-10-CM | POA: Diagnosis not present

## 2017-09-22 DIAGNOSIS — R918 Other nonspecific abnormal finding of lung field: Secondary | ICD-10-CM | POA: Insufficient documentation

## 2017-09-22 DIAGNOSIS — M1990 Unspecified osteoarthritis, unspecified site: Secondary | ICD-10-CM | POA: Insufficient documentation

## 2017-09-22 DIAGNOSIS — M7989 Other specified soft tissue disorders: Secondary | ICD-10-CM | POA: Diagnosis not present

## 2017-09-22 DIAGNOSIS — IMO0001 Reserved for inherently not codable concepts without codable children: Secondary | ICD-10-CM | POA: Diagnosis present

## 2017-09-22 DIAGNOSIS — C9110 Chronic lymphocytic leukemia of B-cell type not having achieved remission: Secondary | ICD-10-CM | POA: Insufficient documentation

## 2017-09-22 DIAGNOSIS — Z6841 Body Mass Index (BMI) 40.0 and over, adult: Secondary | ICD-10-CM | POA: Diagnosis not present

## 2017-09-22 DIAGNOSIS — R5081 Fever presenting with conditions classified elsewhere: Secondary | ICD-10-CM

## 2017-09-22 DIAGNOSIS — Z7982 Long term (current) use of aspirin: Secondary | ICD-10-CM | POA: Insufficient documentation

## 2017-09-22 DIAGNOSIS — J1000 Influenza due to other identified influenza virus with unspecified type of pneumonia: Secondary | ICD-10-CM | POA: Diagnosis not present

## 2017-09-22 DIAGNOSIS — Z96643 Presence of artificial hip joint, bilateral: Secondary | ICD-10-CM | POA: Insufficient documentation

## 2017-09-22 DIAGNOSIS — Z8249 Family history of ischemic heart disease and other diseases of the circulatory system: Secondary | ICD-10-CM | POA: Diagnosis not present

## 2017-09-22 DIAGNOSIS — J181 Lobar pneumonia, unspecified organism: Secondary | ICD-10-CM

## 2017-09-22 DIAGNOSIS — J09X1 Influenza due to identified novel influenza A virus with pneumonia: Secondary | ICD-10-CM | POA: Diagnosis not present

## 2017-09-22 DIAGNOSIS — Z79899 Other long term (current) drug therapy: Secondary | ICD-10-CM | POA: Diagnosis not present

## 2017-09-22 DIAGNOSIS — J189 Pneumonia, unspecified organism: Secondary | ICD-10-CM

## 2017-09-22 DIAGNOSIS — R509 Fever, unspecified: Secondary | ICD-10-CM

## 2017-09-22 DIAGNOSIS — J188 Other pneumonia, unspecified organism: Secondary | ICD-10-CM | POA: Diagnosis not present

## 2017-09-22 DIAGNOSIS — A419 Sepsis, unspecified organism: Secondary | ICD-10-CM

## 2017-09-22 LAB — I-STAT CG4 LACTIC ACID, ED: Lactic Acid, Venous: 0.69 mmol/L (ref 0.5–1.9)

## 2017-09-22 LAB — CBC WITH DIFFERENTIAL/PLATELET
BASOS ABS: 0 10*3/uL (ref 0.0–0.1)
Basophils Relative: 0 %
Eosinophils Absolute: 0 10*3/uL (ref 0.0–0.7)
Eosinophils Relative: 0 %
HEMATOCRIT: 33.4 % — AB (ref 39.0–52.0)
Hemoglobin: 10.2 g/dL — ABNORMAL LOW (ref 13.0–17.0)
LYMPHS ABS: 48.6 10*3/uL — AB (ref 0.7–4.0)
LYMPHS PCT: 82 %
MCH: 26.4 pg (ref 26.0–34.0)
MCHC: 30.5 g/dL (ref 30.0–36.0)
MCV: 86.3 fL (ref 78.0–100.0)
MONOS PCT: 1 %
Monocytes Absolute: 0.6 10*3/uL (ref 0.1–1.0)
Neutro Abs: 10.1 10*3/uL — ABNORMAL HIGH (ref 1.7–7.7)
Neutrophils Relative %: 17 %
Platelets: 136 10*3/uL — ABNORMAL LOW (ref 150–400)
RBC: 3.87 MIL/uL — AB (ref 4.22–5.81)
RDW: 16.2 % — AB (ref 11.5–15.5)
WBC: 59.3 10*3/uL — AB (ref 4.0–10.5)

## 2017-09-22 LAB — BASIC METABOLIC PANEL
ANION GAP: 8 (ref 5–15)
BUN: 10 mg/dL (ref 6–20)
CHLORIDE: 103 mmol/L (ref 101–111)
CO2: 24 mmol/L (ref 22–32)
Calcium: 7.4 mg/dL — ABNORMAL LOW (ref 8.9–10.3)
Creatinine, Ser: 0.72 mg/dL (ref 0.61–1.24)
GFR calc non Af Amer: >60 mL/min (ref >60)
GLUCOSE: 194 mg/dL — AB (ref 65–99)
POTASSIUM: 4.2 mmol/L (ref 3.5–5.1)
Sodium: 135 mmol/L (ref 135–145)

## 2017-09-22 MED ORDER — SODIUM CHLORIDE 0.9 % IV SOLN
2.0000 g | INTRAVENOUS | Status: DC
  Administered 2017-09-22: 2 g via INTRAVENOUS
  Filled 2017-09-22: qty 20

## 2017-09-22 MED ORDER — IBUPROFEN 600 MG PO TABS
600.0000 mg | ORAL_TABLET | Freq: Four times a day (QID) | ORAL | Status: DC | PRN
  Administered 2017-09-22 – 2017-09-23 (×3): 600 mg via ORAL
  Filled 2017-09-22 (×4): qty 1

## 2017-09-22 MED ORDER — BENZONATATE 100 MG PO CAPS
100.0000 mg | ORAL_CAPSULE | Freq: Three times a day (TID) | ORAL | Status: DC | PRN
  Administered 2017-09-22 (×2): 100 mg via ORAL
  Filled 2017-09-22 (×2): qty 1

## 2017-09-22 MED ORDER — ACETAMINOPHEN 650 MG RE SUPP: 650.0000 mg | Freq: Four times a day (QID) | RECTAL | Status: DC | PRN

## 2017-09-22 MED ORDER — SODIUM CHLORIDE 0.9 % IV SOLN
100.0000 mg | Freq: Once | INTRAVENOUS | Status: AC
  Administered 2017-09-22: 100 mg via INTRAVENOUS
  Filled 2017-09-22: qty 100

## 2017-09-22 MED ORDER — HYDROCODONE-HOMATROPINE 5-1.5 MG/5ML PO SYRP
5.0000 mL | ORAL_SOLUTION | Freq: Every evening | ORAL | Status: DC | PRN
  Administered 2017-09-22 (×2): 5 mL via ORAL
  Filled 2017-09-22 (×2): qty 5

## 2017-09-22 MED ORDER — ACETAMINOPHEN 325 MG PO TABS
650.0000 mg | ORAL_TABLET | Freq: Four times a day (QID) | ORAL | Status: DC | PRN
  Administered 2017-09-22 – 2017-09-23 (×3): 650 mg via ORAL
  Filled 2017-09-22 (×3): qty 2

## 2017-09-22 MED ORDER — ASPIRIN EC 81 MG PO TBEC
81.0000 mg | DELAYED_RELEASE_TABLET | Freq: Every day | ORAL | Status: DC
  Administered 2017-09-22 – 2017-09-23 (×2): 81 mg via ORAL
  Filled 2017-09-22 (×2): qty 1

## 2017-09-22 MED ORDER — ENOXAPARIN SODIUM 40 MG/0.4ML ~~LOC~~ SOLN
40.0000 mg | SUBCUTANEOUS | Status: DC
  Administered 2017-09-23: 40 mg via SUBCUTANEOUS
  Filled 2017-09-22: qty 0.4

## 2017-09-22 MED ORDER — OSELTAMIVIR PHOSPHATE 75 MG PO CAPS
75.0000 mg | ORAL_CAPSULE | Freq: Two times a day (BID) | ORAL | Status: DC
Start: 2017-09-22 — End: 2017-09-23
  Administered 2017-09-22 – 2017-09-23 (×3): 75 mg via ORAL
  Filled 2017-09-22 (×3): qty 1

## 2017-09-22 MED ORDER — ADULT MULTIVITAMIN W/MINERALS CH
1.0000 | ORAL_TABLET | Freq: Every day | ORAL | Status: DC
  Administered 2017-09-23: 1 via ORAL
  Filled 2017-09-22: qty 1

## 2017-09-22 MED ORDER — FERROUS SULFATE 325 (65 FE) MG PO TABS
325.0000 mg | ORAL_TABLET | Freq: Every day | ORAL | Status: DC
  Administered 2017-09-23: 325 mg via ORAL
  Filled 2017-09-22: qty 1

## 2017-09-22 MED ORDER — GUAIFENESIN ER 600 MG PO TB12
600.0000 mg | ORAL_TABLET | Freq: Two times a day (BID) | ORAL | Status: DC
  Administered 2017-09-22 – 2017-09-23 (×3): 600 mg via ORAL
  Filled 2017-09-22 (×3): qty 1

## 2017-09-22 MED ORDER — SODIUM CHLORIDE 0.9 % IV BOLUS
1000.0000 mL | Freq: Once | INTRAVENOUS | Status: AC
  Administered 2017-09-22: 1000 mL via INTRAVENOUS

## 2017-09-22 MED ORDER — ACETAMINOPHEN 325 MG PO TABS
650.0000 mg | ORAL_TABLET | Freq: Once | ORAL | Status: AC | PRN
  Administered 2017-09-22: 650 mg via ORAL
  Filled 2017-09-22: qty 2

## 2017-09-22 NOTE — ED Notes (Signed)
Family at bedside. 

## 2017-09-22 NOTE — ED Notes (Signed)
Admitting at bedside 

## 2017-09-22 NOTE — ED Triage Notes (Signed)
Pt arrives by [facility] for cough and sob. Pt was diagnosed with Type A flu on Thursday and was up all night coughing and started having blood tinged sputum this morning with increased sob. Pt also been having high fevers and been taking Motrin. Temp 103 reported at home.

## 2017-09-22 NOTE — H&P (Addendum)
Date: 09/22/2017               Patient Name:  Henry Hinton MRN: [id number]  DOB: January 01, 1961 Age / Sex: 57 y.o., male   PCP: Henry Agreste, MD         Medical Service: Internal Medicine Teaching Service         Attending Physician: Dr. Fatima Blank, *    First Contact: Dr. Lars Mage Pager: [phone]  Second Contact: Dr. 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 who presented with a one week history of cough, myalgia, sore throat, and fatigue, chills, and fever. The patient was diagnosed with Influenza A on 09/20/17 at [facility] urgent care. He was started on tamiflu, hycodan and tessalon capsules, tylenol and ibuprofen, and to hydrate and rest.   Patient presented to ED today 5/25 stating that he felt that he was feeling worse. He stated that he was coughing throughout the night and was producing blood tinged sputum, dyspnea, and with highest temperature of 103 noted at home. He also mentioned that he has a 6/10 pain in his bilateral ribs that he describes as being achy in nature and worsened with breaths.   ED Course:  Fever of 102.9, tachycardic 109, tachypnic 25, Spo2=90s RA, normotensive (128/68) Code sepsis was called in ED 1L NS bolus given Radiology read of chest x-ray was that there was presence of right lower lobe airspace diasease Doxy and rocephrin given in ED for presumed post viral pneumonia  Meds:  Current Meds  Medication Sig  . aspirin EC 81 MG tablet Take 81 mg by mouth daily.  . benzonatate (TESSALON) 100 MG capsule Take 1-2 capsules (100-200 mg total) by mouth 3 (three) times daily as needed.  . ferrous sulfate (IRON SUPPLEMENT) 325 (65 FE) MG  tablet Take 325 mg by mouth daily with breakfast.  . furosemide (LASIX) 80 MG tablet Take 1 tablet (80 mg total) by mouth 2 (two) times daily.  Marland Kitchen HYDROcodone-homatropine (HYCODAN) 5-1.5 MG/5ML syrup Take 5 mLs by mouth at bedtime as needed.  Marland Kitchen ibuprofen (ADVIL,MOTRIN) 200 MG tablet Take 600 mg by mouth every 6 (six) hours as needed for mild pain.  . Multiple Vitamins-Minerals (MULTIVITAMIN WITH MINERALS) tablet Take 1 tablet by mouth daily.  Marland Kitchen oseltamivir (TAMIFLU) 75 MG capsule Take 1 capsule (75 mg total) by mouth 2 (two) times daily.  . potassium chloride SA (K-DUR,KLOR-CON) 20 MEQ tablet Take 1 tablet (20 mEq total) by mouth 2 (two) times daily.     Allergies: Allergies as of 09/22/2017 - Review Complete 09/22/2017  Allergen Reaction Noted  . Adhesive [tape] Other (See Comments) 02/15/2015   Past Medical History:  Diagnosis Date  . Anemia   . Arthritis   . BMI 50.0-59.9, adult ([place])   . Cellulitis 11/2015   trunk  . cll 11/2014   CLL - 11/30/2014- [facility]. [facility] [place], [place]  . DVT (deep venous thrombosis) ([place]) 6/16   Rt calf  took xarelto for 6 months  . Family history of anesthesia complication    " father having delirium after anesthesia"  . Heart murmur  Hx; of as a child  . History of kidney stones    x2  . Joint pain    Hx: of periodically  . Numbness and tingling    Hx: of in right arm  . Pneumonia   . Sleep apnea    Cpap use- settings 10    Family History:   Family History  Problem Relation Age of Onset  . COPD Maternal Grandmother   . Heart disease Maternal Grandfather   . Lung cancer Paternal Grandfather        LUNG  . Diabetes Father   . Prostate cancer Father        PROSTATE   Mother-atrial fibrillation  Social History:   Social History   Socioeconomic History  . Marital status: Divorced    Spouse name: Not on file  . Number of children: 0  . Years of education: Not on file  . Highest education level: Not on file    Occupational History  . Occupation: Geophysical data processor: [employer]  . Financial resource strain: Not on file  . Food insecurity:    Worry: Not on file    Inability: Not on file  . Transportation needs:    Medical: Not on file    Non-medical: Not on file  Tobacco Use  . Smoking status: Never Smoker  . Smokeless tobacco: Former Systems developer    Types: Chew  Substance and Sexual Activity  . Alcohol use: Yes    Alcohol/week: 0.6 oz    Types: 1 Standard drinks or equivalent per week    Comment: BEER AND LIQUOR  . Drug use: No  . Sexual activity: Not Currently  Lifestyle  . Physical activity:    Days per week: Not on file    Minutes per session: Not on file  . Stress: Not on file  Relationships  . Social connections:    Talks on phone: Not on file    Gets together: Not on file    Attends religious service: Not on file    Active member of club or organization: Not on file    Attends meetings of clubs or organizations: Not on file    Relationship status: Not on file  Other Topics Concern  . Not on file  Social History Narrative   Exercise swimming 3-5 times/week for 45 minutes    Review of Systems: A complete ROS was negative except as per HPI.   Physical Exam: Blood pressure 128/68, pulse 82, temperature (!) 102.9 F (39.4 C), temperature source Oral, resp. rate (!) 27, height 5\' 9"  (1.753 m), weight (!) 400 lb (181.4 kg), SpO2 97 %.  Physical Exam  Constitutional: He appears well-developed and well-nourished. No distress.  HENT:  Head: Normocephalic and atraumatic.  No posterior pharyngeal erythema or exudate noted  Eyes: Conjunctivae are normal.  Cardiovascular: Normal rate, regular rhythm and normal heart sounds.  Respiratory: Effort normal and breath sounds normal. No respiratory distress. He has no wheezes. He exhibits tenderness (bilateral lower ribs).  No egophany noted   GI: Soft. Bowel sounds are normal. He exhibits no  distension. There is no tenderness.  Musculoskeletal: He exhibits no edema.  Lymphadenopathy:    He has cervical adenopathy.  Neurological: He is alert.  Skin: He is not diaphoretic. No erythema.  Psychiatric: He has a normal mood and affect. His behavior is normal. Judgment and thought content normal.    EKG: personally reviewed my interpretation is sinus tachycardia,  no st or t wave changes noted  CXR: personally reviewed my interpretation is small amount of opacity noted in the right lower lobe. No effusions visualized.  Assessment & Plan by Problem:  Mr. Menz is a 57 y.o with CLL, OSA on home cpap, and morbid obesity presented with worsening upper respiratory symptoms (cough, dyspnea, fever, and chills) after being diagnosed with influenza a two days ago. Chest xray shows possible small right lower lobe infiltrate. Patient will be admitted as observation to manage and monitor his symptoms.   Possible post viral right lower lobe pneumonia Influenza A The patient has continued worsening of his symptoms including increase in frequency of cough, dyspnea, and increase in fever (103). Although patient has tachycardia, fever, tachypnea, leukocytosis (wbc=59), and known respiratory source he does not have any organ dysfunction to meet sepsis criteria. His lactic acid 0.69, mild thrombocytopenia=136 and leukocytosis from CLL, and cr=0.72. Patient has a qsofa of 1 that does not place him at high risk for in hospital mortality.   The patient's chest x-ray findings are not promising for right lower lobe infiltrate. The patient's symptoms are likely due to influenza. Patient was given one dose of doxycycline and ceftriaxone in the ED which was discontinued.  -Blood culture pending -CMP pending -Influenza A positive per test 09/20/17 -Rapid strep A negative per 09/20/17 test -continue tamiflu, day 3  Chronic Lymphocytic Leukemia The patient is being followed for his CLL by Dr. [name] at [facility]. He is being monitored with frequent lab draws, but is not on any active therapy. His next appointment is on June 11th.   Patient's wbc=59.3, lymp abs 48.6, neutro abs 10.1.   -Encourage patient follow up with Dr. [name]  OSA  -CPAP  Dispo: Admit patient to Observation with expected length of stay less than 2 midnights.  SignedLars Mage, MD 09/22/2017, 10:26 AM  Pager: [phone]

## 2017-09-22 NOTE — Discharge Summary (Signed)
Name: Henry Hinton MRN: [id number] DOB: [birth year] 57 y.o. PCP: [name], MD  Date of Admission: 09/22/2017  7:10 AM Date of Discharge: 09/23/17 Attending Physician: Axel Filler, *  Discharge Diagnosis:  Active Problems:   Influenza, pneumonia  Discharge Medications: Allergies as of 09/23/2017      Reactions   Adhesive [tape] Other (See Comments)   Blisters and rash at site      Medication List    TAKE these medications   aspirin EC 81 MG tablet Take 81 mg by mouth daily.   benzonatate 100 MG capsule Commonly known as:  TESSALON Take 1-2 capsules (100-200 mg total) by mouth 3 (three) times daily as needed.   furosemide 80 MG tablet Commonly known as:  LASIX Take 1 tablet (80 mg total) by mouth 2 (two) times daily.   HYDROcodone-homatropine 5-1.5 MG/5ML syrup Commonly known as:  HYCODAN Take 5 mLs by mouth at bedtime as needed.   ibuprofen 200 MG tablet Commonly known as:  ADVIL,MOTRIN Take 600 mg by mouth every 6 (six) hours as needed for mild pain.   IRON SUPPLEMENT 325 (65 FE) MG tablet Generic drug:  ferrous sulfate Take 325 mg by mouth daily with breakfast.   multivitamin with minerals tablet Take 1 tablet by mouth daily.   oseltamivir 75 MG capsule Commonly known as:  TAMIFLU Take 1 capsule (75 mg total) by mouth 2 (two) times daily for 2 days.   potassium chloride SA 20 MEQ tablet Commonly known as:  K-DUR,KLOR-CON Take 1 tablet (20 mEq total) by mouth 2 (two) times daily.       Disposition and follow-up:   [name] was discharged from [facility] in stable condition.  At the hospital follow up visit please address:  1.  Influenza A: Please ensure that the patient has taken full 10 doses of tamiflu. Please evaluate to ensure that patient does not have any worsening symptoms.   2.  Labs / imaging needed at time of follow-up: none  3.  Pending labs/ test needing follow-up: none  Follow-up  Appointments: Follow-up Information    [name], MD. Schedule an appointment as soon as possible for a visit in 1 week(s).   Specialties:  Family Medicine, Sports Medicine Contact information: [place] [address] [place] Hospital Course by problem list: Active Problems:   Influenza, pneumonia   Influenza A pneumonia The patient presented with worsening cough, dyspnea, fever, and chills after being diagnosed with influenza A 2 days ago. Chest xray on admission showed small right lower lobe infiltrate. Patient received a dose of doxycycline and ceftriaxone in the ED which was discontinued as patient's pneumonia was thought to be due to influenza. Patient was continued on tamiflu and given a 1L normal saline bolus. The patient was doing well on day of discharge, ambulated well without any dyspnea noted. He was discharged home with instructions to complete 4 additional doses of tamiflu, get rest, keep well hydrated, and follow up with primary care physician.   Discharge Vitals:   BP 122/69 (BP Location: Right Arm)   Pulse 60   Temp 98.4 F (36.9 C) (Oral)   Resp 20   Ht 5\' 9"  (1.753 m)   Wt (!) 400 lb (181.4 kg)   SpO2 93%   BMI 59.07 kg/m   Pertinent Labs, Studies, and Procedures:  CBC    Component Value Date/Time  WBC 51.3 (HH) 09/23/2017 0352   RBC 3.68 (L) 09/23/2017 0352   HGB 9.9 (L) 09/23/2017 0352   HCT 32.0 (L) 09/23/2017 0352   PLT 136 (L) 09/23/2017 0352   MCV 87.0 09/23/2017 0352   MCV 83.8 09/20/2017 1607   MCH 26.9 09/23/2017 0352   MCHC 30.9 09/23/2017 0352   RDW 16.3 (H) 09/23/2017 0352   LYMPHSABS 48.6 (H) 09/22/2017 0830   MONOABS 0.6 09/22/2017 0830   EOSABS 0.0 09/22/2017 0830   BASOSABS 0.0 09/22/2017 0830   CMP     Component Value Date/Time   NA 134 (L) 09/23/2017 0352   NA 142 06/01/2017 1708   K 3.4 (L) 09/23/2017 0352   CL 101 09/23/2017 0352   CO2 25 09/23/2017 0352   GLUCOSE 240 (H)  09/23/2017 0352   BUN 9 09/23/2017 0352   BUN 17 06/01/2017 1708   CREATININE 0.71 09/23/2017 0352   CREATININE 0.71 02/24/2016 1603   CALCIUM 7.5 (L) 09/23/2017 0352   PROT 6.3 (L) 09/23/2017 0352   PROT 7.1 06/01/2017 1708   ALBUMIN 2.5 (L) 09/23/2017 0352   ALBUMIN 4.3 06/01/2017 1708   AST 50 (H) 09/23/2017 0352   ALT 44 09/23/2017 0352   ALKPHOS 133 (H) 09/23/2017 0352   BILITOT 1.2 09/23/2017 0352   BILITOT 0.4 06/01/2017 1708   GFRNONAA >60 09/23/2017 0352   GFRNONAA >89 02/24/2016 1603   GFRAA >60 09/23/2017 0352   GFRAA >89 02/24/2016 1603    Discharge Instructions: Discharge Instructions    Call MD for:  difficulty breathing, headache or visual disturbances   Complete by:  As directed    Call MD for:  persistant nausea and vomiting   Complete by:  As directed    Call MD for:  redness, tenderness, or signs of infection (pain, swelling, redness, odor or green/yellow discharge around incision site)   Complete by:  As directed    Call MD for:  temperature >100.4   Complete by:  As directed    Diet - low sodium heart healthy   Complete by:  As directed    Diet - low sodium heart healthy   Complete by:  As directed    Discharge instructions   Complete by:  As directed    It was a pleasure to take care of you [name]. During your hospitalization you were taken care of Influena a (flu). Please complete your tamiflu course (4 more doses). Thank you!   Increase activity slowly   Complete by:  As directed    Increase activity slowly   Complete by:  As directed       Signed: Lars Mage, MD 09/23/2017, 11:29 AM   Pager: [phone]

## 2017-09-22 NOTE — ED Provider Notes (Signed)
[facility] EMERGENCY DEPARTMENT Provider Note  CSN: [id number] Arrival date & time: 09/22/17 0710  Chief Complaint(s) Influenza  HPI Henry Hinton is a 57 y.o. male   The history is provided by the patient.  Influenza  Presenting symptoms: cough, fatigue, fever, myalgias, rhinorrhea and sore throat   Presenting symptoms: no nausea and no vomiting   Severity:  Moderate Onset quality:  Gradual Duration:  1 week Progression:  Waxing and waning Chronicity:  New Relieved by:  OTC medications Worsened by:  Nothing Associated symptoms: chills, decreased appetite and nasal congestion   Associated symptoms: no mental status change   Associated symptoms comment:  Hemoptysis this am  Diagnosed with Type a influenza 2 days ago and placed on Tamiflu at PCP. Rapid strep negative. CXR negaitve.  Patient with a history of CLL followed by Dr. [name] at [facility], not currently on any medication.  Past Medical History Past Medical History:  Diagnosis Date  . Anemia   . Arthritis   . BMI 50.0-59.9, adult ([place])   . Cellulitis 11/2015   trunk  . cll 11/2014   CLL - 11/30/2014- Dr. [facility] [place], [place]  . DVT (deep venous thrombosis) ([place]) 6/16   Rt calf  took xarelto for 6 months  . Family history of anesthesia complication    " father having delirium after anesthesia"  . Heart murmur    Hx; of as a child  . History of kidney stones    x2  . Joint pain    Hx: of periodically  . Numbness and tingling    Hx: of in right arm  . Pneumonia   . Sleep apnea    Cpap use- settings 10   Patient Active Problem List   Diagnosis Date Noted  . Abdominal wall cellulitis 01/22/2017  . Insect bite 01/22/2017  . Hyperkalemia 01/22/2017  . OSA (obstructive sleep apnea) 01/22/2017  . Essential hypertension   . Hoarseness 06/08/2016  . Cellulitis 11/28/2015  . Cellulitis of trunk 11/27/2015  . Nonspecific abnormal finding in stool contents   . Colon cancer  screening   . Venous stasis   . History of DVT (deep vein thrombosis)   . Sepsis ([place]) 10/09/2015  . Panniculitis 10/09/2015  . CLL (chronic lymphocytic leukemia) ([place]) 05/27/2015  . 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 Leukocytosis (leucocytosis) 08/29/2014  . HNP (herniated nucleus pulposus), cervical 01/21/2013  . bilat hip replacement 04/08/2012  . Morbid obesity ([place]) 04/08/2012  . Edema 04/08/2012   Home Medication(s) Prior to Admission medications   Medication Sig Start Date End Date Taking? Authorizing Provider  aspirin EC 81 MG tablet Take 81 mg by mouth daily.   Yes [provider]  benzonatate (TESSALON) 100 MG capsule Take 1-2 capsules (100-200 mg total) by mouth 3 (three) times daily as needed. 09/20/17  Yes Jaynee Eagles, PA-C  ferrous sulfate (IRON SUPPLEMENT) 325 (65 FE) MG tablet Take 325 mg by mouth daily with breakfast.   Yes [provider]  furosemide (LASIX) 80 MG tablet Take 1 tablet (80 mg total) by mouth 2 (two) times daily. 06/01/17  Yes [name], MD  HYDROcodone-homatropine [facility]) 5-1.5 MG/5ML syrup Take 5 mLs by mouth at bedtime as needed. 09/20/17  Yes Jaynee Eagles, PA-C  ibuprofen (ADVIL,MOTRIN) 200 MG tablet Take 600 mg by mouth every 6 (six) hours as needed for mild pain.   Yes [provider]  Multiple Vitamins-Minerals (MULTIVITAMIN WITH MINERALS) tablet Take 1 tablet by mouth  daily.   Yes [provider]  oseltamivir (TAMIFLU) 75 MG capsule Take 1 capsule (75 mg total) by mouth 2 (two) times daily. 09/20/17  Yes Jaynee Eagles, PA-C  potassium chloride SA (K-DUR,KLOR-CON) 20 MEQ tablet Take 1 tablet (20 mEq total) by mouth 2 (two) times daily. 06/01/17  Yes [name], MD                                                                                                                                    Past Surgical History Past Surgical History:  Procedure Laterality Date  . BACK SURGERY    . COLONOSCOPY     Hx: of    . COLONOSCOPY N/A 11/23/2015   Procedure: COLONOSCOPY;  Surgeon: Irene Shipper, MD;  Location: [facility] ENDOSCOPY;  Service: Endoscopy;  Laterality: N/A;  . INGUINAL HERNIA REPAIR Left 06/01/2015   Procedure: LEFT INGUINAL HERNIA REPAIR WITH MESH;  Surgeon: Autumn Messing III, MD;  Location: [facility] ORS;  Service: General;  Laterality: Left;  . INGUINAL HERNIA REPAIR    . INSERTION OF MESH Left 06/01/2015   Procedure: INSERTION OF MESH;  Surgeon: Autumn Messing III, MD;  Location: [facility] ORS;  Service: General;  Laterality: Left;  . JOINT REPLACEMENT Bilateral    BTHA  . KNEE ARTHROSCOPY Left 07/21/2014   Dr. Noemi Chapel  . LUMBAR LAMINECTOMY  2005  . POSTERIOR CERVICAL LAMINECTOMY WITH MET- RX Right 01/21/2013   Procedure: Right Sitting C6-7 Microdiskectomy with Met-rex;  Surgeon: Kristeen Miss, MD;  Location: [facility];  Service: Neurosurgery;  Laterality: Right;  Right Sitting C6-7 Microdiskectomy with Met-rex  . STENT PLACEMENT RT URETER ([place] [facility]X)  10/23/2014  . tenosynovitis  2000   Hx: of thumb  . TONSILLECTOMY    . TOTAL HIP ARTHROPLASTY Bilateral   . WISDOM TOOTH EXTRACTION  1974   all 4   Family History Family History  Problem Relation Age of Onset  . COPD Maternal Grandmother   . Heart disease Maternal Grandfather   . Lung cancer Paternal Grandfather        LUNG  . Diabetes Father   . Prostate cancer Father        PROSTATE    Social History Social History   Tobacco Use  . Smoking status: Never Smoker  . Smokeless tobacco: Former Systems developer    Types: Chew  Substance Use Topics  . Alcohol use: Yes    Alcohol/week: 0.6 oz    Types: 1 Standard drinks or equivalent per week    Comment: BEER AND LIQUOR  . Drug use: No   Allergies Adhesive [tape]  Review of Systems Review of Systems  Constitutional: Positive for chills, decreased appetite, fatigue and fever.  HENT: Positive for congestion, rhinorrhea and sore throat.   Respiratory: Positive for cough.   Gastrointestinal: Negative for nausea and  vomiting.  Musculoskeletal: Positive for myalgias.   All other systems are reviewed  and are negative for acute change except as noted in the HPI  Physical Exam Vital Signs  I have reviewed the triage vital signs BP 128/68 (BP Location: Right Arm)   Pulse (!) 109   Temp (!) 102.9 F (39.4 C) (Oral)   Resp (!) 25   SpO2 94%   Physical Exam  Constitutional: He is oriented to person, place, and time. He appears well-developed and well-nourished. No distress.  Obese  HENT:  Head: Normocephalic and atraumatic.  Right Ear: Tympanic membrane is not injected and not erythematous. A middle ear effusion is present.  Left Ear: Tympanic membrane is not injected and not erythematous. A middle ear effusion is present.  Nose: Nose normal.  Mouth/Throat: Mucous membranes are not dry. Posterior oropharyngeal erythema (mild with PND) present. No oropharyngeal exudate or posterior oropharyngeal edema. No tonsillar exudate.  Eyes: Pupils are equal, round, and reactive to light. Conjunctivae and EOM are normal. Right eye exhibits no discharge. Left eye exhibits no discharge. No scleral icterus.  Neck: Normal range of motion. Neck supple.  Cardiovascular: Normal rate and regular rhythm. Exam reveals no gallop and no friction rub.  No murmur heard. Pulmonary/Chest: Effort normal. No stridor. No respiratory distress. He has rales in the right lower field.  Abdominal: Soft. He exhibits no distension. There is no tenderness.  Musculoskeletal: He exhibits no edema or tenderness.  Neurological: He is alert and oriented to person, place, and time.  Skin: Skin is warm and dry. No rash noted. He is not diaphoretic. No erythema.  Psychiatric: He has a normal mood and affect.  Vitals reviewed.   ED Results and Treatments Labs (all labs ordered are listed, but only abnormal results are displayed) Labs Reviewed  CBC WITH DIFFERENTIAL/PLATELET - Abnormal; Notable for the following components:      Result Value     WBC 59.3 (*)    RBC 3.87 (*)    Hemoglobin 10.2 (*)    HCT 33.4 (*)    RDW 16.2 (*)    Platelets 136 (*)    All other components within normal limits  BASIC METABOLIC PANEL - Abnormal; Notable for the following components:   Glucose, Bld 194 (*)    Calcium 7.4 (*)    All other components within normal limits  CULTURE, BLOOD (ROUTINE X 2)  CULTURE, BLOOD (ROUTINE X 2)  I-STAT CG4 LACTIC ACID, ED                                                                                                                         EKG  EKG Interpretation  Date/Time:  Saturday Sep 22 2017 07:19:53 EDT Ventricular Rate:  110 PR Interval:    QRS Duration: 102 QT Interval:  333 QTC Calculation: 451 R Axis:   -11 Text Interpretation:  Sinus tachycardia Abnormal R-wave progression, late transition NO STEMI No old tracing to compare Confirmed by Addison Lank [phone]) on 09/22/2017 9:22:53 AM      Radiology Dg  Chest 2 View  Result Date: 09/22/2017 CLINICAL DATA:  Cough and shortness of breath.  Fever. EXAM: CHEST - 2 VIEW COMPARISON:  09/20/2017 FINDINGS: Midline trachea. Normal heart size and mediastinal contours. No pleural effusion or pneumothorax. Apparent interstitial prominence is likely due to patient body habitus. On the frontal radiograph, possible right lower lobe airspace disease. Not well localized on the lateral. IMPRESSION: Possible developing right lower lobe airspace disease. Consider radiographic follow-up. Electronically Signed   By: Abigail Miyamoto M.D.   On: 09/22/2017 09:20   Pertinent labs & imaging results that were available during my care of the patient were reviewed by me and considered in my medical decision making (see chart for details).  Medications Ordered in ED Medications  doxycycline (VIBRAMYCIN) 100 mg in sodium chloride 0.9 % 250 mL IVPB (has no administration in time range)  cefTRIAXone (ROCEPHIN) 2 g in sodium chloride 0.9 % 100 mL IVPB (has no administration in time  range)  acetaminophen (TYLENOL) tablet 650 mg (650 mg Oral Given 09/22/17 0728)  sodium chloride 0.9 % bolus 1,000 mL (1,000 mLs Intravenous New Bag/Given 09/22/17 0900)                                                                                                                                    Procedures Procedures CRITICAL CARE Performed by: Grayce Sessions Cardama Total critical care time: 30 minutes Critical care time was exclusive of separately billable procedures and treating other patients. Critical care was necessary to treat or prevent imminent or life-threatening deterioration. Critical care was time spent personally by me on the following activities: development of treatment plan with patient and/or surrogate as well as nursing, discussions with consultants, evaluation of patient's response to treatment, examination of patient, obtaining history from patient or surrogate, ordering and performing treatments and interventions, ordering and review of laboratory studies, ordering and review of radiographic studies, pulse oximetry and re-evaluation of patient's condition.   (including critical care time)  Medical Decision Making / ED Course I have reviewed the nursing notes for this encounter and the patient's prior records (if available in EHR or on provided paperwork).    Patient is febrile and tachycardic.  Had positive influenza 2 days ago, currently on Tamiflu.  Presents with persistent symptoms.  Faint crackles in right lower lobe.  Chest x-ray concerning for developing pneumonia.  Labs with significant leukocytosis but improved from recent CBC.  Other labs grossly reassuring without significant electrolyte derangements or renal insufficiency.  Concerning for post viral/post influenza pneumonia.  Code sepsis initiated.  Given patient's history of CLL, feel that he would benefit from several days of IV antibiotics.  Will discuss case with medicine for admission and continued  management.  Final Clinical Impression(s) / ED Diagnoses Final diagnoses:  Fever  Community acquired pneumonia of right lower lobe of lung ([place])  Sepsis, due to unspecified organism [facility])      This  chart was dictated using voice recognition software.  Despite best efforts to proofread,  errors can occur which can change the documentation meaning.   Fatima Blank, MD 09/22/17 1019

## 2017-09-22 NOTE — ED Notes (Signed)
PRN cough medication given because pt is having increased coughing and pt is tired from not sleeping all night so bedtime cough medication was also given in hopes pt could get some rest.

## 2017-09-22 NOTE — Progress Notes (Signed)
Pt roomed on unit at 1445hrs, running a fever of 101.5. No assigned MD at this time

## 2017-09-23 DIAGNOSIS — Z6841 Body Mass Index (BMI) 40.0 and over, adult: Secondary | ICD-10-CM | POA: Diagnosis not present

## 2017-09-23 DIAGNOSIS — Z7982 Long term (current) use of aspirin: Secondary | ICD-10-CM

## 2017-09-23 DIAGNOSIS — Z79899 Other long term (current) drug therapy: Secondary | ICD-10-CM

## 2017-09-23 DIAGNOSIS — R5081 Fever presenting with conditions classified elsewhere: Secondary | ICD-10-CM | POA: Diagnosis not present

## 2017-09-23 DIAGNOSIS — J1000 Influenza due to other identified influenza virus with unspecified type of pneumonia: Secondary | ICD-10-CM | POA: Diagnosis not present

## 2017-09-23 LAB — COMPREHENSIVE METABOLIC PANEL
ALK PHOS: 133 U/L — AB (ref 38–126)
ALT: 44 U/L (ref 17–63)
AST: 50 U/L — AB (ref 15–41)
Albumin: 2.5 g/dL — ABNORMAL LOW (ref 3.5–5.0)
Anion gap: 8 (ref 5–15)
BUN: 9 mg/dL (ref 6–20)
CALCIUM: 7.5 mg/dL — AB (ref 8.9–10.3)
CO2: 25 mmol/L (ref 22–32)
CREATININE: 0.71 mg/dL (ref 0.61–1.24)
Chloride: 101 mmol/L (ref 101–111)
GFR calc non Af Amer: >60 mL/min (ref >60)
GLUCOSE: 240 mg/dL — AB (ref 65–99)
Potassium: 3.4 mmol/L — ABNORMAL LOW (ref 3.5–5.1)
SODIUM: 134 mmol/L — AB (ref 135–145)
Total Bilirubin: 1.2 mg/dL (ref 0.3–1.2)
Total Protein: 6.3 g/dL — ABNORMAL LOW (ref 6.5–8.1)

## 2017-09-23 LAB — CBC
HCT: 32 % — ABNORMAL LOW (ref 39.0–52.0)
HEMOGLOBIN: 9.9 g/dL — AB (ref 13.0–17.0)
MCH: 26.9 pg (ref 26.0–34.0)
MCHC: 30.9 g/dL (ref 30.0–36.0)
MCV: 87 fL (ref 78.0–100.0)
Platelets: 136 10*3/uL — ABNORMAL LOW (ref 150–400)
RBC: 3.68 MIL/uL — AB (ref 4.22–5.81)
RDW: 16.3 % — ABNORMAL HIGH (ref 11.5–15.5)
WBC: 51.3 10*3/uL — AB (ref 4.0–10.5)

## 2017-09-23 LAB — CULTURE, GROUP A STREP: Strep A Culture: NEGATIVE

## 2017-09-23 MED ORDER — OSELTAMIVIR PHOSPHATE 75 MG PO CAPS: 75.0000 mg | ORAL_CAPSULE | Freq: Two times a day (BID) | ORAL | 0 refills | Status: AC

## 2017-09-23 MED ORDER — FUROSEMIDE 80 MG PO TABS
80.0000 mg | ORAL_TABLET | Freq: Two times a day (BID) | ORAL | Status: DC
  Filled 2017-09-23: qty 1

## 2017-09-23 MED ORDER — ORAL CARE MOUTH RINSE: 15.0000 mL | Freq: Two times a day (BID) | OROMUCOSAL | Status: DC

## 2017-09-23 MED ORDER — POTASSIUM CHLORIDE CRYS ER 20 MEQ PO TBCR
20.0000 meq | EXTENDED_RELEASE_TABLET | Freq: Two times a day (BID) | ORAL | Status: DC
  Administered 2017-09-23: 20 meq via ORAL
  Filled 2017-09-23: qty 1

## 2017-09-23 NOTE — Discharge Instructions (Signed)

## 2017-09-23 NOTE — Evaluation (Signed)
Physical Therapy Evaluation Patient Details Name: Henry Hinton MRN: [id number] DOB: [birth year] Today's Date: 09/23/2017   History of Present Illness  Pt. is a 57 y.o. M with significant PMH of OSA, morbid obesity, and history of DVT who prseents with one week history of cough, myalgia, sore throat and fatigue, chills and fever. Diagnosed with influenza A on 09/20/17.   Clinical Impression  Patient evaluated by Physical Therapy with no further acute PT needs identified. Patient ambulating 320 feet independently with no assistive device with dyspnea on exertion but maintaining between 91-94% SpO2. Education provided on activity recommendations, pacing, and endurance strategies. Patient verbalized understanding. All education has been completed and the patient has no further questions. PT is signing off. Thank you for this referral.     Follow Up Recommendations No PT follow up    Equipment Recommendations  None recommended by PT    Recommendations for Other Services       Precautions / Restrictions Precautions Precautions: None Restrictions Weight Bearing Restrictions: No      Mobility  Bed Mobility               General bed mobility comments: OOB in recliner  Transfers Overall transfer level: Independent                  Ambulation/Gait Ambulation/Gait assistance: Independent Ambulation Distance (Feet): 320 Feet Assistive device: None Gait Pattern/deviations: WFL(Within Functional Limits)     General Gait Details: Slightly wider BOS utilized secondary to obesity but overall WFL. Maintaining between 91-94% on RA with mobility.  Stairs            Wheelchair Mobility    Modified Rankin (Stroke Patients Only)       Balance Overall balance assessment: No apparent balance deficits (not formally assessed)                                           Pertinent Vitals/Pain Pain Assessment: No/denies pain    Home Living  Family/patient expects to be discharged to:: Private residence Living Arrangements: Parent Available Help at Discharge: Family Type of Home: House Home Access: Stairs to enter Entrance Stairs-Rails: None Entrance Stairs-Number of Steps: 2 Home Layout: One level Home Equipment: Grab bars - tub/shower;Shower seat      Prior Function Level of Independence: Independent         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     Hand Dominance        Extremity/Trunk Assessment   Upper Extremity Assessment Upper Extremity Assessment: Overall WFL for tasks assessed    Lower Extremity Assessment Lower Extremity Assessment: Overall WFL for tasks assessed       Communication   Communication: No difficulties  Cognition Arousal/Alertness: Awake/alert Behavior During Therapy: WFL for tasks assessed/performed Overall Cognitive Status: Within Functional Limits for tasks assessed                                        General Comments      Exercises     Assessment/Plan    PT Assessment Patent does not need any further PT services  PT Problem List         PT Treatment Interventions      PT Goals (Current goals can  be found in the Care Plan section)  Acute Rehab PT Goals Patient Stated Goal: go back to work on Tuesday PT Goal Formulation: All assessment and education complete, DC therapy    Frequency     Barriers to discharge        Co-evaluation               AM-PAC PT "6 Clicks" Daily Activity  Outcome Measure Difficulty turning over in bed (including adjusting bedclothes, sheets and blankets)?: None Difficulty moving from lying on back to sitting on the side of the bed? : None Difficulty sitting down on and standing up from a chair with arms (e.g., wheelchair, bedside commode, etc,.)?: None Help needed moving to and from a bed to chair (including a wheelchair)?: None Help needed walking in hospital room?:  None Help needed climbing 3-5 steps with a railing? : A Little 6 Click Score: 23    End of Session   Activity Tolerance: Patient tolerated treatment well Patient left: Other (comment)((standing in room - patient is independent)) Nurse Communication: Mobility status PT Visit Diagnosis: Difficulty in walking, not elsewhere classified (R26.2);Unsteadiness on feet (R26.81)    Time: 6503-5465 PT Time Calculation (min) (ACUTE ONLY): 10 min   Charges:   PT Evaluation $PT Eval Low Complexity: 1 Low     PT G Codes:        Ellamae Sia, PT, DPT Acute Rehabilitation Services  Pager: [place] 09/23/2017, 11:35 AM

## 2017-09-23 NOTE — Progress Notes (Signed)
Paged Phy on call re: pt states he normally takes Lasix 40mg  bid and K 33meq daily. Labs show K is 3.4 this am

## 2017-09-23 NOTE — Progress Notes (Signed)
   Subjective: [name] was seen sitting up in a chair by bedside this morning doing well. He states that he feels much better. His breathing has improved and he is able to move around in room without difficulty. He feels that his cough has decreased as well.  Objective:  Vital signs in last 24 hours: Vitals:   09/22/17 2148 09/23/17 0015 09/23/17 0313 09/23/17 0602  BP: (!) 156/83   122/69  Pulse: 81   60  Resp: (!) 22   20  Temp: (!) 100.4 F (38 C) 99 F (37.2 C) 98.2 F (36.8 C) 98.4 F (36.9 C)  TempSrc: Oral Oral Oral Oral  SpO2: 94%   93%  Weight:      Height:       Physical Exam  Constitutional: He appears well-developed and well-nourished. No distress.  HENT:  Head: Normocephalic and atraumatic.  Eyes: Conjunctivae are normal.  Cardiovascular: Normal rate, regular rhythm and normal heart sounds.  Respiratory: Effort normal and breath sounds normal. No respiratory distress. He has no wheezes.  Mild rhonchi noted in bilateral lower lung fields  GI: Soft. Bowel sounds are normal. He exhibits no distension. There is no tenderness.  Musculoskeletal: He exhibits no edema.  Neurological: He is alert.  Skin: He is not diaphoretic. No erythema.  Psychiatric: He has a normal mood and affect. His behavior is normal. Judgment and thought content normal.    Assessment/Plan: [name] is a 57 y.o with CLL, OSA on home cpap, and morbid obesity presented with worsening upper respiratory symptoms (cough, dyspnea, fever, and chills) after being diagnosed with influenza a two days ago. Chest xray shows possible small right lower lobe infiltrate. The patient is stable and has improved during hospitalization with fluids and supportive care.  Possible post viral right lower lobe pneumonia Influenza A The patient appears better today. He continues to be afebrile and leukocyte count today has come down slightly (although unreliable in setting of CLL) from 59 to 51. His blood culture is no  growth <24hrs. The patient has received 5 doses of tamiflu thusfar. He will get his 6th dose today at 10am prior to discharge.   Chronic Lymphocytic Leukemia  -Encourage patient follow up with Dr. [name]  Lower extremity swelling  -Resumed patient's lasix 80mg  bid and kdur 59meq bid  OSA  -CPAP  Dispo: Anticipated discharge today.  Lars Mage, MD 09/23/2017, 9:07 AM Pager: [phone]

## 2017-09-23 NOTE — Progress Notes (Signed)
Pt discharged to home accompanied by mother.  DC instructions given and reviewed with pt and copy given.  No questions about home self care.   Took pt temp orally before DC and it was 98.9. Pt understands follow up and what to call for.

## 2017-09-23 NOTE — Progress Notes (Signed)
Pt has his home unit CPAP. On arrival patient is already on his home unit CPAP

## 2017-09-24 ENCOUNTER — Encounter: Payer: Self-pay | Admitting: Family Medicine

## 2017-09-24 LAB — BLOOD CULTURE ID PANEL (REFLEXED)
Acinetobacter baumannii: NOT DETECTED
CANDIDA ALBICANS: NOT DETECTED
CANDIDA GLABRATA: NOT DETECTED
CANDIDA KRUSEI: NOT DETECTED
Candida parapsilosis: NOT DETECTED
Candida tropicalis: NOT DETECTED
ENTEROBACTER CLOACAE COMPLEX: NOT DETECTED
ENTEROBACTERIACEAE SPECIES: NOT DETECTED
ENTEROCOCCUS SPECIES: NOT DETECTED
ESCHERICHIA COLI: NOT DETECTED
Haemophilus influenzae: NOT DETECTED
Klebsiella oxytoca: NOT DETECTED
Klebsiella pneumoniae: NOT DETECTED
LISTERIA MONOCYTOGENES: NOT DETECTED
METHICILLIN RESISTANCE: DETECTED — AB
NEISSERIA MENINGITIDIS: NOT DETECTED
PROTEUS SPECIES: NOT DETECTED
PSEUDOMONAS AERUGINOSA: NOT DETECTED
STAPHYLOCOCCUS AUREUS BCID: NOT DETECTED
STAPHYLOCOCCUS SPECIES: DETECTED — AB
Serratia marcescens: NOT DETECTED
Streptococcus agalactiae: NOT DETECTED
Streptococcus pneumoniae: NOT DETECTED
Streptococcus pyogenes: NOT DETECTED
Streptococcus species: NOT DETECTED

## 2017-09-25 NOTE — Progress Notes (Signed)
Negative strep culture.

## 2017-09-26 LAB — CULTURE, BLOOD (ROUTINE X 2)

## 2017-09-26 LAB — PATHOLOGIST SMEAR REVIEW

## 2017-09-27 LAB — CULTURE, BLOOD (ROUTINE X 2): Culture: NO GROWTH

## 2017-09-28 ENCOUNTER — Other Ambulatory Visit: Payer: Self-pay

## 2017-09-28 ENCOUNTER — Ambulatory Visit: Payer: BC Managed Care – PPO | Admitting: Family Medicine

## 2017-09-28 ENCOUNTER — Ambulatory Visit (INDEPENDENT_AMBULATORY_CARE_PROVIDER_SITE_OTHER): Payer: BC Managed Care – PPO

## 2017-09-28 ENCOUNTER — Encounter: Payer: Self-pay | Admitting: Family Medicine

## 2017-09-28 VITALS — BP 140/80 | HR 76 | Temp 98.5°F | Resp 16 | Ht 67.8 in | Wt 388.8 lb

## 2017-09-28 DIAGNOSIS — E119 Type 2 diabetes mellitus without complications: Secondary | ICD-10-CM

## 2017-09-28 DIAGNOSIS — R634 Abnormal weight loss: Secondary | ICD-10-CM

## 2017-09-28 DIAGNOSIS — J1100 Influenza due to unidentified influenza virus with unspecified type of pneumonia: Secondary | ICD-10-CM | POA: Diagnosis not present

## 2017-09-28 DIAGNOSIS — R252 Cramp and spasm: Secondary | ICD-10-CM | POA: Diagnosis not present

## 2017-09-28 LAB — BASIC METABOLIC PANEL
BUN/Creatinine Ratio: 29 — ABNORMAL HIGH (ref 9–20)
BUN: 23 mg/dL (ref 6–24)
CALCIUM: 9.3 mg/dL (ref 8.7–10.2)
CO2: 24 mmol/L (ref 20–29)
CREATININE: 0.8 mg/dL (ref 0.76–1.27)
Chloride: 97 mmol/L (ref 96–106)
GFR calc Af Amer: 115 mL/min/{1.73_m2} (ref >59)
GFR calc non Af Amer: 99 mL/min/{1.73_m2} (ref >59)
GLUCOSE: 143 mg/dL — AB (ref 65–99)
Potassium: 5.1 mmol/L (ref 3.5–5.2)
Sodium: 139 mmol/L (ref 134–144)

## 2017-09-28 LAB — CK: CK TOTAL: 106 U/L (ref 24–204)

## 2017-09-28 LAB — MAGNESIUM: MAGNESIUM: 2.6 mg/dL — AB (ref 1.6–2.3)

## 2017-09-28 NOTE — Progress Notes (Signed)
Subjective:  By signing my name below, I, Henry Hinton, attest that this documentation has been prepared under the direction and in the presence of Henry Ray, MD. Electronically Signed: Moises Hinton, [place]. 09/28/2017 , 4:37 PM .  Patient was seen in Room 2 .   Patient ID: Henry Hinton, male    DOB: Mar 13, 1961, 57 y.o.   MRN: [id number] Chief Complaint  Patient presents with  . Hospitalization Follow-up    f/u- influenza  . Shortness of Breath    X 1 week   HPI Henry Hinton is a 57 y.o. male  Here for hospital follow up and complaining of shortness of breath. He was admitted on May 25th through May 26th with pneumonia. He was previously diagnosed with influenza A on May 23rd, was treated with tamiflu. He had chest xray on admission, showed small right lower lobe infiltrate; received dose of doxycycline and rocephin in the ED. Plan for 4 additional doses of tamiflu. Hinton cultures 1 of 2 positive for coag, negative staph, which appeared to be contaminated, low suspicion for true bacteria.   Patient states he's been feeling better. He informs feeling awful on Sunday (5/26) and Monday (5/27), but in the past few days, he's feeling relatively better. Although, he notes he hasn't had much sleep due to cramping. He's also lost about 15 lbs in the past few days. He reports drinking plenty of water and trying to stay hydrated.   Wt Readings from Last 3 Encounters:  09/28/17 (!) 388 lb 12.8 oz (176.4 kg)  09/22/17 (!) 400 lb (181.4 kg)  09/20/17 (!) 400 lb (181.4 kg)   He was taking Lasix bid, but in the last 2 days, he's been taking Lasix qd. Even with the Lasix qd, he still has some cramping. He notes decreased appetite probably with the heat of recent weather.   Cough and shortness of breath He notes cough and shortness of breath improved. He denies any fevers since hospital discharge. He had possible developing right lower lobe airspace disease; recommended follow up xray.     Diabetes Lab Results  Component Value Date   HGBA1C 8.1 (H) 06/01/2017   Discussed on office visit 06/01/17. He hasn't been checking his sugars at home. He denies any medication changes at the hospital. His glucose was 194 on May 25th. He had normal urine micro albumin on 06/01/17.   Patient Active Problem List   Diagnosis Date Noted  . Influenza, pneumonia 09/22/2017  . Abdominal wall cellulitis 01/22/2017  . Insect bite 01/22/2017  . Hyperkalemia 01/22/2017  . OSA (obstructive sleep apnea) 01/22/2017  . Essential hypertension   . Hoarseness 06/08/2016  . Cellulitis 11/28/2015  . Cellulitis of trunk 11/27/2015  . Nonspecific abnormal finding in stool contents   . Colon cancer screening   . Venous stasis   . History of DVT (deep vein thrombosis)   . Sepsis ([place]) 10/09/2015  . Panniculitis 10/09/2015  . CLL (chronic lymphocytic leukemia) ([place]) 05/27/2015  . 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 Leukocytosis (leucocytosis) 08/29/2014  . HNP (herniated nucleus pulposus), cervical 01/21/2013  . bilat hip replacement 04/08/2012  . Morbid obesity ([place]) 04/08/2012  . Edema 04/08/2012   Past Medical History:  Diagnosis Date  . Anemia   . Arthritis   . BMI 50.0-59.9, adult ([place])   . Cellulitis 11/2015   trunk  . cll 11/2014   CLL - 11/30/2014- Dr. [facility] [place], [place]  . DVT (deep venous thrombosis) ([place]) 6/16  Rt calf  took xarelto for 6 months  . Family history of anesthesia complication    " father having delirium after anesthesia"  . Heart murmur    Hx; of as a child  . History of kidney stones    x2  . Joint pain    Hx: of periodically  . Numbness and tingling    Hx: of in right arm  . Pneumonia   . Sleep apnea    Cpap use- settings 10   Past Surgical History:  Procedure Laterality Date  . BACK SURGERY    . COLONOSCOPY     Hx: of  . COLONOSCOPY N/A 11/23/2015   Procedure: COLONOSCOPY;  Surgeon: Irene Shipper, MD;  Location: [facility];  Service:  Endoscopy;  Laterality: N/A;  . INGUINAL HERNIA REPAIR Left 06/01/2015   Procedure: LEFT INGUINAL HERNIA REPAIR WITH MESH;  Surgeon: Autumn Messing III, MD;  Location: [facility];  Service: General;  Laterality: Left;  . INGUINAL HERNIA REPAIR    . INSERTION OF MESH Left 06/01/2015   Procedure: INSERTION OF MESH;  Surgeon: Autumn Messing III, MD;  Location: [facility];  Service: General;  Laterality: Left;  . JOINT REPLACEMENT Bilateral    BTHA  . KNEE ARTHROSCOPY Left 07/21/2014   Dr. Noemi Chapel  . LUMBAR LAMINECTOMY  2005  . POSTERIOR CERVICAL LAMINECTOMY WITH MET- RX Right 01/21/2013   Procedure: Right Sitting C6-7 Microdiskectomy with Met-rex;  Surgeon: Kristeen Miss, MD;  Location: [facility];  Service: Neurosurgery;  Laterality: Right;  Right Sitting C6-7 Microdiskectomy with Met-rex  . STENT PLACEMENT RT URETER ([place] [facility])  10/23/2014  . tenosynovitis  2000   Hx: of thumb  . TONSILLECTOMY    . TOTAL HIP ARTHROPLASTY Bilateral   . WISDOM TOOTH EXTRACTION  1974   all 4   Allergies  Allergen Reactions  . Adhesive [Tape] Other (See Comments)    Blisters and rash at site   Prior to Admission medications   Medication Sig Start Date End Date Taking? Authorizing Provider  aspirin EC 81 MG tablet Take 81 mg by mouth daily.    [provider]  benzonatate (TESSALON) 100 MG capsule Take 1-2 capsules (100-200 mg total) by mouth 3 (three) times daily as needed. 09/20/17   Jaynee Eagles, PA-C  ferrous sulfate (IRON SUPPLEMENT) 325 (65 FE) MG tablet Take 325 mg by mouth daily with breakfast.    [provider]  furosemide (LASIX) 80 MG tablet Take 1 tablet (80 mg total) by mouth 2 (two) times daily. 06/01/17   [name], MD  HYDROcodone-homatropine [facility]) 5-1.5 MG/5ML syrup Take 5 mLs by mouth at bedtime as needed. 09/20/17   Jaynee Eagles, PA-C  ibuprofen (ADVIL,MOTRIN) 200 MG tablet Take 600 mg by mouth every 6 (six) hours as needed for mild pain.    [provider]  Multiple  Vitamins-Minerals (MULTIVITAMIN WITH MINERALS) tablet Take 1 tablet by mouth daily.    [provider]  potassium chloride SA (K-DUR,KLOR-CON) 20 MEQ tablet Take 1 tablet (20 mEq total) by mouth 2 (two) times daily. 06/01/17   [name], MD   Social History   Socioeconomic History  . Marital status: Divorced    Spouse name: Not on file  . Number of children: 0  . Years of education: Not on file  . Highest education level: Not on file  Occupational History  . Occupation: Geophysical data processor: [employer]  .  Financial resource strain: Not on file  . Food insecurity:    Worry: Not on file    Inability: Not on file  . Transportation needs:    Medical: Not on file    Non-medical: Not on file  Tobacco Use  . Smoking status: Never Smoker  . Smokeless tobacco: Former Systems developer    Types: Chew  Substance and Sexual Activity  . Alcohol use: Yes    Alcohol/week: 0.6 oz    Types: 1 Standard drinks or equivalent per week    Comment: BEER AND LIQUOR  . Drug use: No  . Sexual activity: Not Currently  Lifestyle  . Physical activity:    Days per week: Not on file    Minutes per session: Not on file  . Stress: Not on file  Relationships  . Social connections:    Talks on phone: Not on file    Gets together: Not on file    Attends religious service: Not on file    Active member of club or organization: Not on file    Attends meetings of clubs or organizations: Not on file    Relationship status: Not on file  . Intimate partner violence:    Fear of current or ex partner: Not on file    Emotionally abused: Not on file    Physically abused: Not on file    Forced sexual activity: Not on file  Other Topics Concern  . Not on file  Social History Narrative   Exercise swimming 3-5 times/week for 45 minutes   Review of Systems  Constitutional: Negative for fatigue and unexpected weight change.  Eyes: Negative for visual disturbance.    Respiratory: Positive for cough. Negative for chest tightness and shortness of breath.   Cardiovascular: Negative for chest pain, palpitations and leg swelling.  Gastrointestinal: Negative for abdominal pain and Hinton in stool.  Musculoskeletal: Positive for myalgias.  Neurological: Negative for dizziness, light-headedness and headaches.       Objective:   Physical Exam  Constitutional: He is oriented to person, place, and time. He appears well-developed and well-nourished.  HENT:  Head: Normocephalic and atraumatic.  Eyes: Pupils are equal, round, and reactive to light. EOM are normal.  Neck: No JVD present. Carotid bruit is not present.  Cardiovascular: Normal rate, regular rhythm and normal heart sounds.  No murmur heard. Pulmonary/Chest: Effort normal and breath sounds normal. He has no rales.  Musculoskeletal: He exhibits no edema.  Pitting edema in ankles with some stasis changes without signs of infection  Neurological: He is alert and oriented to person, place, and time.  Skin: Skin is warm and dry.  Psychiatric: He has a normal mood and affect.  Vitals reviewed.   Vitals:   09/28/17 1544 09/28/17 1615  BP: (!) 142/78 140/80  Pulse: 76   Resp: 16   Temp: 98.5 F (36.9 C)   TempSrc: Oral   SpO2: 95%   Weight: (!) 388 lb 12.8 oz (176.4 kg)   Height: 5' 7.8" (1.722 m)    Dg Chest 2 View  Result Date: 09/28/2017 CLINICAL DATA:  Cough. Possible airspace disease, continued surveillance. EXAM: CHEST - 2 VIEW COMPARISON:  09/22/2017. FINDINGS: Heart size is normal. RIGHT base infiltrate has cleared. No effusion or pneumothorax. Bones unremarkable. IMPRESSION: No active disease. Electronically Signed   By: Staci Righter M.D.   On: 09/28/2017 16:49       Assessment & Plan:  Henry Hinton is a 57 y.o. male Influenza with  pneumonia - Plan: DG Chest 2 View Pneumonia of right lower lobe due to influenza A virus - Plan: DG Chest 2 View  -Improving with resolution of  infiltrate on chest x-Hinton.  Continue symptomatic care, RTC precautions given if acute worsening.  Muscle cramps - Plan: Basic metabolic panel, Magnesium, CK  -May be from recent hospitalization, decreased p.o. intake and may be over diuresing with his furosemide.  Stat labs were obtained with reassuring creatinine, CPK.  Borderline elevated magnesium but other electrolytes looked okay.  Continue Lasix once per day for now, hydration, and RTC precautions if persistent or worsening  Loss of weight - Plan: Basic metabolic panel, Magnesium, CK  As above may be related to recent hospitalization and decreased p.o. intake.-Continue to monitor home weights and recheck if unexplained weight loss.  Type 2 diabetes mellitus without complication, without long-term current use of insulin (HCC) - Plan: Hemoglobin A1c  -Persistent elevated glucose readings and some recent high readings.  A1c was obtained, plan for start of metformin 500 mg daily for 1 week initially, then twice daily if tolerated.  Recheck A1c in 3 months  No orders of the defined types were placed in this encounter.  Patient Instructions   I will check the A1c to determine medications for diabetes.  For muscle cramps, make sure you are staying hydrated, agree with taking Lasix once per day for right now with monitoring of weight over this next 1 week.  I will have some electrolytes back tonight to see if there are any acute concerns.   cough should continue to improve.Return to the clinic or go to the nearest emergency room if any of your symptoms worsen or new symptoms occur.    IF you received an x-Hinton today, you will receive an invoice from [facility]. Please contact [facility] at [phone] with questions or concerns regarding your invoice.   IF you received labwork today, you will receive an invoice from [facility]. Please contact [facility] at [phone] with questions or concerns regarding your invoice.   Our  billing staff will not be able to assist you with questions regarding bills from these companies.  You will be contacted with the lab results as soon as they are available. The fastest way to get your results is to activate your My Chart account. Instructions are located on the last page of this paperwork. If you have not heard from [place] regarding the results in 2 weeks, please contact this office.      I personally performed the services described in this documentation, which was scribed in my presence. The recorded information has been reviewed and considered for accuracy and completeness, addended by me as needed, and agree with information above.  Signed,   Henry Ray, MD [facility].  09/28/17 4:56 PM

## 2017-09-28 NOTE — Patient Instructions (Addendum)
I will check the A1c to determine medications for diabetes.  For muscle cramps, make sure you are staying hydrated, agree with taking Lasix once per day for right now with monitoring of weight over this next 1 week.  I will have some electrolytes back tonight to see if there are any acute concerns.   cough should continue to improve.Return to the clinic or go to the nearest emergency room if any of your symptoms worsen or new symptoms occur.    IF you received an x-ray today, you will receive an invoice from [facility]. Please contact [facility] at [phone] with questions or concerns regarding your invoice.   IF you received labwork today, you will receive an invoice from [facility]. Please contact [facility] at [phone] with questions or concerns regarding your invoice.   Our billing staff will not be able to assist you with questions regarding bills from these companies.  You will be contacted with the lab results as soon as they are available. The fastest way to get your results is to activate your My Chart account. Instructions are located on the last page of this paperwork. If you have not heard from [place] regarding the results in 2 weeks, please contact this office.

## 2017-09-29 ENCOUNTER — Other Ambulatory Visit: Payer: Self-pay | Admitting: Family Medicine

## 2017-09-29 ENCOUNTER — Telehealth: Payer: Self-pay | Admitting: Family Medicine

## 2017-09-29 DIAGNOSIS — E1165 Type 2 diabetes mellitus with hyperglycemia: Secondary | ICD-10-CM

## 2017-09-29 LAB — HEMOGLOBIN A1C
Est. average glucose Bld gHb Est-mCnc: 200 mg/dL
Hgb A1c MFr Bld: 8.6 % — ABNORMAL HIGH (ref 4.8–5.6)

## 2017-09-29 MED ORDER — METFORMIN HCL 500 MG PO TABS: 500.0000 mg | ORAL_TABLET | Freq: Two times a day (BID) | ORAL | 1 refills | Status: DC

## 2017-09-29 NOTE — Telephone Encounter (Signed)
Lab note entered.  

## 2017-09-29 NOTE — Progress Notes (Signed)
See lab notes.  Lab Results  Component Value Date   HGBA1C 8.6 (H) 09/28/2017    Start metformin 500mg  qd then bid after 1 week.

## 2017-09-29 NOTE — Telephone Encounter (Signed)
Incoming fax from [facility] regarding patient's BMP, Creatine Kinase, Magnesium. Crystal in lab requests provider review results.

## 2017-10-02 ENCOUNTER — Telehealth: Payer: Self-pay | Admitting: Family Medicine

## 2017-10-02 NOTE — Telephone Encounter (Signed)
Pt given results per notes of Dr Carlota Raspberry on 09/29/17. Unable to document in result note due to result note not being routed to [facility]. Offered to make appt for pt but he stated he will call back when he has his schedule.   Notes recorded by [name], MD on 09/29/2017 at 10:29 AM EDT Call patient.   Muscle enzyme test was normal. Electrolytes overall looked okay. Magnesium level was at the higher end of normal, but not low as we were looking for with the muscle cramps.  Unfortunately hemoglobin A1c has increased to 8.6. Would recommend he start metformin initially 500 mg once a day, increasing to twice per day after 1 week if tolerated. Please advise that sometimes can experience diarrhea or stomach upset with that medication. If that occurs, let me know. Follow-up in the next 3 months for diabetes. Let me know if the muscle cramps are not improving with lower dose of lasix.

## 2017-11-19 ENCOUNTER — Emergency Department (HOSPITAL_COMMUNITY): Payer: BC Managed Care – PPO

## 2017-11-19 ENCOUNTER — Encounter (HOSPITAL_COMMUNITY): Payer: Self-pay | Admitting: Emergency Medicine

## 2017-11-19 ENCOUNTER — Inpatient Hospital Stay (HOSPITAL_COMMUNITY)
Admission: EM | Admit: 2017-11-19 | Discharge: 2017-11-23 | DRG: 872 | Disposition: A | Discharge status: Home / Self Care | Payer: BC Managed Care – PPO | Attending: Internal Medicine | Admitting: Internal Medicine

## 2017-11-19 DIAGNOSIS — Z7984 Long term (current) use of oral hypoglycemic drugs: Secondary | ICD-10-CM

## 2017-11-19 DIAGNOSIS — I10 Essential (primary) hypertension: Secondary | ICD-10-CM | POA: Diagnosis present

## 2017-11-19 DIAGNOSIS — Z801 Family history of malignant neoplasm of trachea, bronchus and lung: Secondary | ICD-10-CM

## 2017-11-19 DIAGNOSIS — L03311 Cellulitis of abdominal wall: Secondary | ICD-10-CM | POA: Diagnosis not present

## 2017-11-19 DIAGNOSIS — R197 Diarrhea, unspecified: Secondary | ICD-10-CM | POA: Diagnosis present

## 2017-11-19 DIAGNOSIS — Z8701 Personal history of pneumonia (recurrent): Secondary | ICD-10-CM

## 2017-11-19 DIAGNOSIS — Z7982 Long term (current) use of aspirin: Secondary | ICD-10-CM

## 2017-11-19 DIAGNOSIS — Z79899 Other long term (current) drug therapy: Secondary | ICD-10-CM

## 2017-11-19 DIAGNOSIS — A419 Sepsis, unspecified organism: Secondary | ICD-10-CM | POA: Diagnosis not present

## 2017-11-19 DIAGNOSIS — E119 Type 2 diabetes mellitus without complications: Secondary | ICD-10-CM

## 2017-11-19 DIAGNOSIS — G4733 Obstructive sleep apnea (adult) (pediatric): Secondary | ICD-10-CM | POA: Diagnosis present

## 2017-11-19 DIAGNOSIS — Z87442 Personal history of urinary calculi: Secondary | ICD-10-CM

## 2017-11-19 DIAGNOSIS — C9190 Lymphoid leukemia, unspecified not having achieved remission: Secondary | ICD-10-CM

## 2017-11-19 DIAGNOSIS — Z825 Family history of asthma and other chronic lower respiratory diseases: Secondary | ICD-10-CM

## 2017-11-19 DIAGNOSIS — Z8619 Personal history of other infectious and parasitic diseases: Secondary | ICD-10-CM

## 2017-11-19 DIAGNOSIS — Z833 Family history of diabetes mellitus: Secondary | ICD-10-CM

## 2017-11-19 DIAGNOSIS — Z96643 Presence of artificial hip joint, bilateral: Secondary | ICD-10-CM | POA: Diagnosis present

## 2017-11-19 DIAGNOSIS — Z6841 Body Mass Index (BMI) 40.0 and over, adult: Secondary | ICD-10-CM

## 2017-11-19 DIAGNOSIS — Z8249 Family history of ischemic heart disease and other diseases of the circulatory system: Secondary | ICD-10-CM

## 2017-11-19 DIAGNOSIS — Z86718 Personal history of other venous thrombosis and embolism: Secondary | ICD-10-CM

## 2017-11-19 DIAGNOSIS — D696 Thrombocytopenia, unspecified: Secondary | ICD-10-CM | POA: Diagnosis present

## 2017-11-19 DIAGNOSIS — Z8042 Family history of malignant neoplasm of prostate: Secondary | ICD-10-CM

## 2017-11-19 DIAGNOSIS — Z91048 Other nonmedicinal substance allergy status: Secondary | ICD-10-CM

## 2017-11-19 DIAGNOSIS — Z872 Personal history of diseases of the skin and subcutaneous tissue: Secondary | ICD-10-CM

## 2017-11-19 DIAGNOSIS — Z9089 Acquired absence of other organs: Secondary | ICD-10-CM

## 2017-11-19 DIAGNOSIS — C9110 Chronic lymphocytic leukemia of B-cell type not having achieved remission: Secondary | ICD-10-CM | POA: Diagnosis present

## 2017-11-19 DIAGNOSIS — Z9989 Dependence on other enabling machines and devices: Secondary | ICD-10-CM

## 2017-11-19 DIAGNOSIS — D509 Iron deficiency anemia, unspecified: Secondary | ICD-10-CM | POA: Diagnosis present

## 2017-11-19 LAB — URINALYSIS, ROUTINE W REFLEX MICROSCOPIC
BACTERIA UA: NONE SEEN
Bilirubin Urine: NEGATIVE
Glucose, UA: NEGATIVE mg/dL
Ketones, ur: NEGATIVE mg/dL
Leukocytes, UA: NEGATIVE
Nitrite: NEGATIVE
PROTEIN: NEGATIVE mg/dL
Specific Gravity, Urine: 1.015 (ref 1.005–1.030)
pH: 5 (ref 5.0–8.0)

## 2017-11-19 LAB — CBC WITH DIFFERENTIAL/PLATELET
BASOS PCT: 0 %
Basophils Absolute: 0 10*3/uL (ref 0.0–0.1)
EOS PCT: 0 %
Eosinophils Absolute: 0 10*3/uL (ref 0.0–0.7)
HEMATOCRIT: 44.4 % (ref 39.0–52.0)
HEMOGLOBIN: 13.5 g/dL (ref 13.0–17.0)
LYMPHS PCT: 79 %
Lymphs Abs: 55.9 10*3/uL — ABNORMAL HIGH (ref 0.7–4.0)
MCH: 27.2 pg (ref 26.0–34.0)
MCHC: 30.4 g/dL (ref 30.0–36.0)
MCV: 89.5 fL (ref 78.0–100.0)
MONO ABS: 1.4 10*3/uL — AB (ref 0.1–1.0)
MONOS PCT: 2 %
NEUTROS PCT: 19 %
Neutro Abs: 13.4 10*3/uL — ABNORMAL HIGH (ref 1.7–7.7)
Platelets: 191 10*3/uL (ref 150–400)
RBC: 4.96 MIL/uL (ref 4.22–5.81)
RDW: 16.2 % — ABNORMAL HIGH (ref 11.5–15.5)
WBC: 70.7 10*3/uL — AB (ref 4.0–10.5)

## 2017-11-19 LAB — COMPREHENSIVE METABOLIC PANEL
ALT: 24 U/L (ref 0–44)
AST: 30 U/L (ref 15–41)
Albumin: 3.9 g/dL (ref 3.5–5.0)
Alkaline Phosphatase: 122 U/L (ref 38–126)
Anion gap: 12 (ref 5–15)
BUN: 18 mg/dL (ref 6–20)
CHLORIDE: 103 mmol/L (ref 98–111)
CO2: 24 mmol/L (ref 22–32)
CREATININE: 1.13 mg/dL (ref 0.61–1.24)
Calcium: 9 mg/dL (ref 8.9–10.3)
GFR calc non Af Amer: >60 mL/min (ref >60)
Glucose, Bld: 158 mg/dL — ABNORMAL HIGH (ref 70–99)
POTASSIUM: 5.1 mmol/L (ref 3.5–5.1)
Sodium: 139 mmol/L (ref 135–145)
Total Bilirubin: 0.5 mg/dL (ref 0.3–1.2)
Total Protein: 7.1 g/dL (ref 6.5–8.1)

## 2017-11-19 LAB — I-STAT CG4 LACTIC ACID, ED: Lactic Acid, Venous: 2.46 mmol/L (ref 0.5–1.9)

## 2017-11-19 MED ORDER — VANCOMYCIN HCL 10 G IV SOLR
2000.0000 mg | Freq: Once | INTRAVENOUS | Status: AC
  Administered 2017-11-19: 2000 mg via INTRAVENOUS
  Filled 2017-11-19: qty 2000

## 2017-11-19 MED ORDER — ACETAMINOPHEN 500 MG PO TABS
1000.0000 mg | ORAL_TABLET | Freq: Once | ORAL | Status: AC
  Administered 2017-11-19: 1000 mg via ORAL
  Filled 2017-11-19: qty 2

## 2017-11-19 MED ORDER — SODIUM CHLORIDE 0.9 % IV BOLUS
1000.0000 mL | Freq: Once | INTRAVENOUS | Status: AC
  Administered 2017-11-19: 1000 mL via INTRAVENOUS

## 2017-11-19 NOTE — H&P (Signed)
History and Physical    Henry Hinton KMQ:[id number] DOB: 14-May-1960 DOA: 11/19/2017  Referring MD/NP/PA:   PCP: [name], MD   Patient coming from:  The patient is coming from home.  At baseline, pt is independent for most of ADL.       Chief Complaint: Fever, chills, abdominal wall erythema and tenderness, diarrhea  HPI: Henry Hinton is a 57 y.o. male with medical history significant of diabetes mellitus, CLL under observation, OSA on CPAP, morbid obesity, iron deficiency anemia, DVT, who presents with fever, chills, abdominal wall erythema and tenderness and diarrhea.  Pt states that he noted abdominal wall tenderness and erythema this afternoon, which has been rapidly worsening.  The pain is constant, severe, nonradiating.  He has fever and chills.  Temperature is 101.9 in ED.  Patient had mild cough and shortness of breath earlier, which have resolved.  Currently no chest pain, shortness breath or cough.  Patient states that he had mild left upper quadrant abdominal soreness, which has resolved.  Patient does not have nausea or vomiting, but has diarrhea.  Patient states that he had 5-6 times of loose stool diarrhea today, but he states that he did not have diarrhea since 8:00 PM (in the past 4 hours). Pt states that he was treated with antibiotics for pneumonia in May, but did not use antibiotics since then. Pt denies symptoms of UTI or unilateral weakness.  He states that he is taking Lasix 80 mg daily for leg edema, not for CHF.  He denies history of CHF.   ED Course: pt was found to have WBC 70.7 with lymphocyte 55.9% and neutrophil 13.4%, lactic acid 2.46, negative urinalysis, creatinine 1.13, GFR> 60, temperature 101.9, tachycardia, tachypnea, oxygen saturation 92 to 96% on room air, negative chest x-ray.  Patient is admitted to telemetry bed as inpatient.  Review of Systems:   General: has fevers, chills, no body weight gain, has poor appetite, has fatigue HEENT: no  blurry vision, hearing changes or sore throat Respiratory: currently no dyspnea, coughing, wheezing CV: no chest pain, no palpitations GI: no nausea, vomiting, abdominal pain, has diarrhea, no constipation GU: no dysuria, burning on urination, increased urinary frequency, hematuria  Ext: has mild leg edema Neuro: no unilateral weakness, numbness, or tingling, no vision change or hearing loss Skin: has erythema, tenderness and warmth in the right lower abdominal wall MSK: No muscle spasm, no deformity, no limitation of range of movement in spin Heme: No easy bruising.  Travel history: No recent long distant travel.  Allergy:  Allergies  Allergen Reactions  . Adhesive [Tape] Other (See Comments)    Blisters and rash at site    Past Medical History:  Diagnosis Date  . Anemia   . Arthritis   . BMI 50.0-59.9, adult ([place])   . Cellulitis 11/2015   trunk  . cll 11/2014   CLL - 11/30/2014- [facility]. [facility] [place], [place]  . DVT (deep venous thrombosis) ([place] 6/16   Rt calf  took xarelto for 6 months  . Family history of anesthesia complication    " father having delirium after anesthesia"  . Heart murmur    Hx; of as a child  . History of kidney stones    x2  . Joint pain    Hx: of periodically  . Numbness and tingling    Hx: of in right arm  . Pneumonia   . Sleep apnea    Cpap use- settings 10  Past Surgical History:  Procedure Laterality Date  . BACK SURGERY    . COLONOSCOPY     Hx: of  . COLONOSCOPY N/A 11/23/2015   Procedure: COLONOSCOPY;  Surgeon: Irene Shipper, MD;  Location: [facility];  Service: Endoscopy;  Laterality: N/A;  . INGUINAL HERNIA REPAIR Left 06/01/2015   Procedure: LEFT INGUINAL HERNIA REPAIR WITH MESH;  Surgeon: Autumn Messing III, MD;  Location: [facility];  Service: General;  Laterality: Left;  . INGUINAL HERNIA REPAIR    . INSERTION OF MESH Left 06/01/2015   Procedure: INSERTION OF MESH;  Surgeon: Autumn Messing III, MD;  Location: [facility];  Service:  General;  Laterality: Left;  . JOINT REPLACEMENT Bilateral    BTHA  . KNEE ARTHROSCOPY Left 07/21/2014   Dr. Noemi Chapel  . LUMBAR LAMINECTOMY  2005  . POSTERIOR CERVICAL LAMINECTOMY WITH MET- RX Right 01/21/2013   Procedure: Right Sitting C6-7 Microdiskectomy with Met-rex;  Surgeon: Kristeen Miss, MD;  Location: [facility];  Service: Neurosurgery;  Laterality: Right;  Right Sitting C6-7 Microdiskectomy with Met-rex  . STENT PLACEMENT RT URETER ([place] HX)  10/23/2014  . tenosynovitis  2000   Hx: of thumb  . TONSILLECTOMY    . TOTAL HIP ARTHROPLASTY Bilateral   . WISDOM TOOTH EXTRACTION  1974   all 4    Social History:  reports that he has never smoked. He quit smokeless tobacco use about 24 years ago. His smokeless tobacco use included chew. He reports that he drinks about 0.6 oz of alcohol per week. He reports that he does not use drugs.  Family History:  Family History  Problem Relation Age of Onset  . COPD Maternal Grandmother   . Heart disease Maternal Grandfather   . Lung cancer Paternal Grandfather        LUNG  . Diabetes Father   . Prostate cancer Father        PROSTATE     Prior to Admission medications   Medication Sig Start Date End Date Taking? Authorizing Provider  aspirin EC 81 MG tablet Take 81 mg by mouth daily.   Yes [provider]  benzonatate (TESSALON) 100 MG capsule Take 1-2 capsules (100-200 mg total) by mouth 3 (three) times daily as needed. Patient taking differently: Take 100-200 mg by mouth 3 (three) times daily as needed for cough.  09/20/17  Yes Jaynee Eagles, PA-C  diclofenac sodium (VOLTAREN) 1 % GEL Apply 2 g topically 4 (four) times daily as needed (pain).   Yes [provider]  ferrous sulfate (IRON SUPPLEMENT) 325 (65 FE) MG tablet Take 325 mg by mouth daily with breakfast.   Yes [provider]  furosemide (LASIX) 80 MG tablet Take 1 tablet (80 mg total) by mouth 2 (two) times daily. Patient taking differently: Take 80 mg by  mouth daily.  06/01/17  Yes [name], MD  HYDROcodone-homatropine [facility]) 5-1.5 MG/5ML syrup Take 5 mLs by mouth at bedtime as needed. Patient taking differently: Take 5 mLs by mouth at bedtime as needed for cough.  09/20/17  Yes Jaynee Eagles, PA-C  ibuprofen (ADVIL,MOTRIN) 200 MG tablet Take 600 mg by mouth every 6 (six) hours as needed for mild pain.   Yes [provider]  metFORMIN (GLUCOPHAGE) 500 MG tablet Take 1 tablet (500 mg total) by mouth 2 (two) times daily with a meal. Start once per day for 1st week. 09/29/17  Yes [name], MD  Multiple Vitamins-Minerals (MULTIVITAMIN WITH MINERALS) tablet Take 1  tablet by mouth daily.   Yes [provider]  potassium chloride SA (K-DUR,KLOR-CON) 20 MEQ tablet Take 1 tablet (20 mEq total) by mouth 2 (two) times daily. 06/01/17  Yes [name], MD    Physical Exam: Vitals:   11/20/17 0230 11/20/17 0330 11/20/17 0345 11/20/17 0424  BP: 140/71 136/68 140/69 (!) 169/79  Pulse: 67 72 67 73  Resp: (!) 26 (!) 21 (!) 23 20  Temp:    98.8 F (37.1 C)  TempSrc:    Oral  SpO2: 94% 98% 98% 97%  Weight:    (!) 174.2 kg (384 lb)  Height:    _0  (1.753 m)   General: Not in acute distress HEENT:       Eyes: PERRL, EOMI, no scleral icterus.       ENT: No discharge from the ears and nose, no pharynx injection, no tonsillar enlargement.        Neck: No JVD, no bruit, no mass felt. Heme: No neck lymph node enlargement. Cardiac: S1/S2, RRR, No murmurs, No gallops or rubs. Respiratory: No rales, wheezing, rhonchi or rubs. GI: Soft, nondistended, nontender, no rebound pain, no organomegaly, BS present. GU: No hematuria Ext: has trace leg edema bilaterally. 2+DP/PT pulse bilaterally. Musculoskeletal: No joint deformities, No joint redness or warmth, no limitation of ROM in spin. Skin:  has erythema, tenderness and warmth in the right lower abdominal wall Neuro: Alert, oriented X3, cranial nerves II-XII grossly intact,  moves all extremities normally.   Labs on Admission: I have personally reviewed following labs and imaging studies  CBC: Recent Labs  Lab 11/19/17 2126  WBC 70.7*  NEUTROABS 13.4*  HGB 13.5  HCT 44.4  MCV 89.5  PLT 300   Basic Metabolic Panel: Recent Labs  Lab 11/19/17 2126  NA 139  K 5.1  CL 103  CO2 24  GLUCOSE 158*  BUN 18  CREATININE 1.13  CALCIUM 9.0   GFR: Estimated Creatinine Clearance: 114.4 mL/min (by C-G formula based on SCr of 1.13 mg/dL). Liver Function Tests: Recent Labs  Lab 11/19/17 2126  AST 30  ALT 24  ALKPHOS 122  BILITOT 0.5  PROT 7.1  ALBUMIN 3.9   No results for input(s): LIPASE, AMYLASE in the last 168 hours. No results for input(s): AMMONIA in the last 168 hours. Coagulation Profile: No results for input(s): INR, PROTIME in the last 168 hours. Cardiac Enzymes: No results for input(s): CKTOTAL, CKMB, CKMBINDEX, TROPONINI in the last 168 hours. BNP (last 3 results) No results for input(s): PROBNP in the last 8760 hours. HbA1C: No results for input(s): HGBA1C in the last 72 hours. CBG: Recent Labs  Lab 11/20/17 0208  GLUCAP 221*   Lipid Profile: No results for input(s): CHOL, HDL, LDLCALC, TRIG, CHOLHDL, LDLDIRECT in the last 72 hours. Thyroid Function Tests: No results for input(s): TSH, T4TOTAL, FREET4, T3FREE, THYROIDAB in the last 72 hours. Anemia Panel: No results for input(s): VITAMINB12, FOLATE, FERRITIN, TIBC, IRON, RETICCTPCT in the last 72 hours. Urine analysis:    Component Value Date/Time   COLORURINE YELLOW 11/19/2017 2130   APPEARANCEUR CLEAR 11/19/2017 2130   LABSPEC 1.015 11/19/2017 2130   PHURINE 5.0 11/19/2017 2130   GLUCOSEU NEGATIVE 11/19/2017 2130   HGBUR SMALL (A) 11/19/2017 2130   BILIRUBINUR NEGATIVE 11/19/2017 2130   BILIRUBINUR negative 06/01/2017 1603   BILIRUBINUR neg 12/06/2013 1446   KETONESUR NEGATIVE 11/19/2017 2130   PROTEINUR NEGATIVE 11/19/2017 2130   UROBILINOGEN 0.2 06/01/2017 1603    UROBILINOGEN  0.2 10/28/2014 1008   NITRITE NEGATIVE 11/19/2017 2130   LEUKOCYTESUR NEGATIVE 11/19/2017 2130   Sepsis Labs: _0 (procalcitonin:4,lacticidven:4) )No results found for this or any previous visit (from the past 240 hour(s)).   Radiological Exams on Admission: Dg Chest 2 View  Result Date: 11/19/2017 CLINICAL DATA:  Shortness of breath EXAM: CHEST - 2 VIEW COMPARISON:  09/28/2017 FINDINGS: The heart size and mediastinal contours are within normal limits. Both lungs are clear. The visualized skeletal structures are unremarkable. IMPRESSION: No active cardiopulmonary disease. Electronically Signed   By: Ulyses Jarred M.D.   On: 11/19/2017 22:00     EKG: Independently reviewed.  Sinus rhythm, QTC 451, LAD, poor R wave progression, nonspecific T wave change  Assessment/Plan Principal Problem:   Abdominal wall cellulitis Active Problems:   CLL (chronic lymphocytic leukemia) (HCC)   Sepsis (HCC)   Essential hypertension   OSA (obstructive sleep apnea)   Diabetes mellitus without complication (HCC)   Iron deficiency anemia   Diarrhea   Sepsis due to abdominal wall cellulitis: Patient is a septic obese WBC 17.7, fever, tachycardia and tachypnea.  Lactic acid elevated at 2.46.  Mental status normal.  Currently hemodynamically stable.   - will admit to tele bed as inpt - Empiric antimicrobial treatment with vancomycin, Flagyl and Rocephin - PRN Zofran for nausea, dilaudid and Percocet for pain - Blood cultures x 2  - ESR and CRP - will get Procalcitonin and trend lactic acid levels per sepsis protocol. - IVF: 3.5 L of NS bolus in ED, followed by 75 cc/h  CLL (chronic lymphocytic leukemia) ([place]):  WBC 70.7 with lymphocyte 55.9% and neutrophil 13.4%. Pt is followed up with Dr. [name] in [facility].  Patient is on observation, no chemotherapy. -f/u by CBC  Essential hypertension: -hold lasix due to sepsis -IV hydralazine as needed  OSA (obstructive sleep  apnea): -CPAP  Diabetes mellitus without complication ([place]):  Last A1c 8.6 on 09/28/17, poorly controled. Patient is taking metformin at home -SSI  Iron deficiency anemia: Hemoglobin stable, 13.5 -Continue iron supplement  Diarrhea: pt state that he did not have diarrhea since 8:00 PM. Possibly due to viral enteritis. -Observe closely -IVF as above   DVT ppx:  SQ Lovenox Code Status: Full code Family Communication:    Yes, patient's lady friend at bed side Disposition Plan:  Anticipate discharge back to previous home environment Consults called:  None Admission status:  Inpatient/tele           Date of Service 11/20/2017    Ivor Costa [facility] Pager [phone]  If 7PM-7AM, please contact night-coverage [email or web address] Password [facility] 11/20/2017, 5:29 AM

## 2017-11-19 NOTE — ED Triage Notes (Signed)
Pt presents with cellulitis to lower abd area, pt states hx of same. Pt also noted to be tachypneic and tachycardic in triage. Recently admitted for sepsis d/t PNA.

## 2017-11-19 NOTE — ED Notes (Signed)
Critical Lab Values reported top Mia, PA

## 2017-11-19 NOTE — Progress Notes (Signed)
Pharmacy Antibiotic Note  Henry Hinton is a 57 y.o. male admitted on 11/19/2017 with cellulitis.  Pharmacy has been consulted for Vancomycin dosing. CLL is likely causing markedly elevated WBC. Renal function good.   Plan: Vancomycin 2000 mg IV x 1, then give 1500 mg IV q8h Trend WBC, temp, renal function  F/U infectious work-up Drug levels ASAP   Height: 5\' 9"  (175.3 cm) Weight: (!) 385 lb (174.6 kg) IBW/kg (Calculated) : 70.7  Temp (24hrs), Avg:100.6 F (38.1 C), Min:99.3 F (37.4 C), Max:101.9 F (38.8 C)  Recent Labs  Lab 11/19/17 2126 11/19/17 2202  WBC 70.7*  --   CREATININE 1.13  --   LATICACIDVEN  --  2.46*    Estimated Creatinine Clearance: 114.6 mL/min (by C-G formula based on SCr of 1.13 mg/dL).    Allergies  Allergen Reactions  . Adhesive [Tape] Other (See Comments)    Blisters and rash at site    Narda Bonds 11/19/2017 11:57 PM

## 2017-11-19 NOTE — ED Provider Notes (Signed)
 [facility] EMERGENCY DEPARTMENT Provider Note   CSN: [id number] Arrival date & time: 11/19/17  2108     History   Chief Complaint Chief Complaint  Patient presents with  . Cellulitis    HPI Henry Hinton is a 57 y.o. male with a history of CLL, nephrolithiasis, DVT, morbid obesity, and OSA who presents to the emergency department with a chief complaint of severe, constant, rapidly worsening redness and swelling to the abdomen, onset today.  He reports associated nonbloody diarrhea that also began today with fever and chills.  He also endorses abdominal pain in the left upper quadrant that also began today.  He denies nausea, vomiting, back pain, hematuria, melena, or hematochezia.   He reports that he has been hospitalized 2 previous times with similar symptoms.  No recent antibiotic use since he was hospitalized in May for pneumonia.  No recent travel or camping.  He reports that he is very active and swims regularly.  No known sick contacts.  He denies any recent insect or tick bites to the abdomen.  The history is provided by the patient. No language interpreter was used.    Past Medical History:  Diagnosis Date  . Anemia   . Arthritis   . BMI 50.0-59.9, adult (HCC)   . Cellulitis 11/2015   trunk  . cll 11/2014   CLL - 11/30/2014- Dr. Chinnasami-[facility] [place], [place]  . DVT (deep venous thrombosis) (HCC) 6/16   Rt calf  took xarelto for 6 months  . Family history of anesthesia complication    " father having delirium after anesthesia"  . Heart murmur    Hx; of as a child  . History of kidney stones    x2  . Joint pain    Hx: of periodically  . Numbness and tingling    Hx: of in right arm  . Pneumonia   . Sleep apnea    Cpap use- settings 10    Patient Active Problem List   Diagnosis Date Noted  . Diabetes mellitus without complication (HCC) 11/19/2017  . Iron deficiency anemia 11/19/2017  . Influenza, pneumonia 09/22/2017  .  Cellulitis, abdominal wall 01/22/2017  . Insect bite 01/22/2017  . Hyperkalemia 01/22/2017  . OSA (obstructive sleep apnea) 01/22/2017  . Essential hypertension   . Hoarseness 06/08/2016  . Cellulitis 11/28/2015  . Cellulitis of trunk 11/27/2015  . Nonspecific abnormal finding in stool contents   . Colon cancer screening   . Venous stasis   . History of DVT (deep vein thrombosis)   . Sepsis (HCC) 10/09/2015  . Panniculitis 10/09/2015  . CLL (chronic lymphocytic leukemia) (HCC) 05/27/2015  . 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 Leukocytosis (leucocytosis) 08/29/2014  . HNP (herniated nucleus pulposus), cervical 01/21/2013  . bilat hip replacement 04/08/2012  . Morbid obesity (HCC) 04/08/2012  . Edema 04/08/2012    Past Surgical History:  Procedure Laterality Date  . BACK SURGERY    . COLONOSCOPY     Hx: of  . COLONOSCOPY N/A 11/23/2015   Procedure: COLONOSCOPY;  Surgeon: John N Perry, MD;  Location: [facility];  Service: Endoscopy;  Laterality: N/A;  . INGUINAL HERNIA REPAIR Left 06/01/2015   Procedure: LEFT INGUINAL HERNIA REPAIR WITH MESH;  Surgeon: Paul Toth III, MD;  Location: [facility];  Service: General;  Laterality: Left;  . INGUINAL HERNIA REPAIR    . INSERTION OF MESH Left 06/01/2015   Procedure: INSERTION OF MESH;  Surgeon: Paul Toth III, MD;    Location: [facility];  Service: General;  Laterality: Left;  . JOINT REPLACEMENT Bilateral    BTHA  . KNEE ARTHROSCOPY Left 07/21/2014   Dr. Noemi Chapel  . LUMBAR LAMINECTOMY  2005  . POSTERIOR CERVICAL LAMINECTOMY WITH MET- RX Right 01/21/2013   Procedure: Right Sitting C6-7 Microdiskectomy with Met-rex;  Surgeon: Kristeen Miss, MD;  Location: [facility];  Service: Neurosurgery;  Laterality: Right;  Right Sitting C6-7 Microdiskectomy with Met-rex  . STENT PLACEMENT RT URETER ([facility])  10/23/2014  . tenosynovitis  2000   Hx: of thumb  . TONSILLECTOMY    . TOTAL HIP ARTHROPLASTY Bilateral   . WISDOM TOOTH EXTRACTION  1974   all 4         Home Medications    Prior to Admission medications   Medication Sig Start Date End Date Taking? Authorizing Provider  aspirin EC 81 MG tablet Take 81 mg by mouth daily.   Yes [provider]  benzonatate (TESSALON) 100 MG capsule Take 1-2 capsules (100-200 mg total) by mouth 3 (three) times daily as needed. Patient taking differently: Take 100-200 mg by mouth 3 (three) times daily as needed for cough.  09/20/17  Yes Jaynee Eagles, PA-C  diclofenac sodium (VOLTAREN) 1 % GEL Apply 2 g topically 4 (four) times daily as needed (pain).   Yes [provider]  ferrous sulfate (IRON SUPPLEMENT) 325 (65 FE) MG tablet Take 325 mg by mouth daily with breakfast.   Yes [provider]  furosemide (LASIX) 80 MG tablet Take 1 tablet (80 mg total) by mouth 2 (two) times daily. Patient taking differently: Take 80 mg by mouth daily.  06/01/17  Yes [name], MD  HYDROcodone-homatropine [facility]) 5-1.5 MG/5ML syrup Take 5 mLs by mouth at bedtime as needed. Patient taking differently: Take 5 mLs by mouth at bedtime as needed for cough.  09/20/17  Yes Jaynee Eagles, PA-C  ibuprofen (ADVIL,MOTRIN) 200 MG tablet Take 600 mg by mouth every 6 (six) hours as needed for mild pain.   Yes [provider]  metFORMIN (GLUCOPHAGE) 500 MG tablet Take 1 tablet (500 mg total) by mouth 2 (two) times daily with a meal. Start once per day for 1st week. 09/29/17  Yes [name], MD  Multiple Vitamins-Minerals (MULTIVITAMIN WITH MINERALS) tablet Take 1 tablet by mouth daily.   Yes [provider]  potassium chloride SA (K-DUR,KLOR-CON) 20 MEQ tablet Take 1 tablet (20 mEq total) by mouth 2 (two) times daily. 06/01/17  Yes [name], MD    Family History Family History  Problem Relation Age of Onset  . COPD Maternal Grandmother   . Heart disease Maternal Grandfather   . Lung cancer Paternal Grandfather        LUNG  . Diabetes Father   . Prostate cancer Father         PROSTATE    Social History Social History   Tobacco Use  . Smoking status: Never Smoker  . Smokeless tobacco: Former Systems developer    Types: Chew  Substance Use Topics  . Alcohol use: Yes    Alcohol/week: 0.6 oz    Types: 1 Standard drinks or equivalent per week    Comment: BEER AND LIQUOR  . Drug use: No     Allergies   Adhesive [tape]   Review of Systems Review of Systems  Constitutional: Positive for chills and fever. Negative for appetite change.  Eyes: Negative for visual disturbance.  Respiratory: Negative for shortness of breath.  Cardiovascular: Negative for chest pain.  Gastrointestinal: Positive for abdominal pain and diarrhea. Negative for blood in stool, nausea and vomiting.  Genitourinary: Negative for dysuria.  Musculoskeletal: Negative for back pain.  Skin: Positive for color change. Negative for rash.  Allergic/Immunologic: Negative for immunocompromised state.  Neurological: Negative for dizziness, seizures, syncope, weakness and headaches.  Psychiatric/Behavioral: Negative for confusion.   Physical Exam Updated Vital Signs BP 132/60   Pulse 89   Temp (!) 101.9 F (38.8 C) (Rectal)   Resp (!) 29   Ht 5' 9" (1.753 m)   Wt (!) 174.6 kg (385 lb)   SpO2 100%   BMI 56.85 kg/m   Physical Exam  Constitutional: He appears well-developed.  HENT:  Head: Normocephalic.  Eyes: Conjunctivae are normal.  Neck: Neck supple.  Cardiovascular: Normal rate, regular rhythm, normal heart sounds and intact distal pulses. Exam reveals no gallop and no friction rub.  No murmur heard. Pulmonary/Chest: Effort normal. No stridor. No respiratory distress. He has no wheezes. He has no rales. He exhibits no tenderness.  Abdominal: Soft. He exhibits no distension and no mass. There is tenderness. There is no rebound and no guarding. No hernia.  Obese abdomen.  Tender to palpation in the left upper quadrant.  There is a large erythematous, warm area to the bilateral lower  abdomen.  No purulent drainage.  No focal areas of induration or fluctuance.  Neurological: He is alert.  Skin: Skin is warm and dry.  Psychiatric: His behavior is normal.  Nursing note and vitals reviewed.    ED Treatments / Results  Labs (all labs ordered are listed, but only abnormal results are displayed) Labs Reviewed  COMPREHENSIVE METABOLIC PANEL - Abnormal; Notable for the following components:      Result Value   Glucose, Bld 158 (*)    All other components within normal limits  CBC WITH DIFFERENTIAL/PLATELET - Abnormal; Notable for the following components:   WBC 70.7 (*)    RDW 16.2 (*)    Neutro Abs 13.4 (*)    Lymphs Abs 55.9 (*)    Monocytes Absolute 1.4 (*)    All other components within normal limits  URINALYSIS, ROUTINE W REFLEX MICROSCOPIC - Abnormal; Notable for the following components:   Hgb urine dipstick SMALL (*)    All other components within normal limits  I-STAT CG4 LACTIC ACID, ED - Abnormal; Notable for the following components:   Lactic Acid, Venous 2.46 (*)    All other components within normal limits  CULTURE, BLOOD (ROUTINE X 2)  CULTURE, BLOOD (ROUTINE X 2)  I-STAT CG4 LACTIC ACID, ED    EKG None  Radiology Dg Chest 2 View  Result Date: 11/19/2017 CLINICAL DATA:  Shortness of breath EXAM: CHEST - 2 VIEW COMPARISON:  09/28/2017 FINDINGS: The heart size and mediastinal contours are within normal limits. Both lungs are clear. The visualized skeletal structures are unremarkable. IMPRESSION: No active cardiopulmonary disease. Electronically Signed   By: Kevin  Herman M.D.   On: 11/19/2017 22:00    Procedures Procedures (including critical care time)  Medications Ordered in ED Medications  vancomycin (VANCOCIN) 2,000 mg in sodium chloride 0.9 % 500 mL IVPB (2,000 mg Intravenous New Bag/Given 11/19/17 2334)  vancomycin (VANCOCIN) 1,500 mg in sodium chloride 0.9 % 500 mL IVPB (has no administration in time range)  sodium chloride 0.9 % bolus  1,000 mL (1,000 mLs Intravenous New Bag/Given 11/19/17 2258)  acetaminophen (TYLENOL) tablet 1,000 mg (1,000 mg Oral Given 11/19/17 2341)       Initial Impression / Assessment and Plan / ED Course  I have reviewed the triage vital signs and the nursing notes.  Pertinent labs & imaging results that were available during my care of the patient were reviewed by me and considered in my medical decision making (see chart for details).     57-year-old male with a history of CLL, nephrolithiasis, DVT, morbid obesity, and OSA who presents to the emergency department with pain, warmth, redness to the abdominal wall.  Initial lactate is 2.46.  The patient was tachycardic in the 110s on arrival.  Rectal temp 101.9. WBC 70.7, up from the patient's baseline of 50-60K.  IV vancomycin dosed per pharmacy.  IV fluid bolus  and Tylenol given.  Repeat lactate is pending.  Given the patient's history of CLL, he will require admission for further observation and management.  Spoke with Dr. Niu with the hospitalist team who will accept the patient for admission. The patient appears reasonably stabilized for admission considering the current resources, flow, and capabilities available in the ED at this time, and I doubt any other EMC requiring further screening and/or treatment in the ED prior to admission.  Final Clinical Impressions(s) / ED Diagnoses   Final diagnoses:  Abdominal wall cellulitis  Sepsis, due to unspecified organism (HCC)    ED Discharge Orders    None       McDonald, Mia A, PA-C 11/20/17 0013    Messick, Peter C, MD 11/21/17 0133  

## 2017-11-20 ENCOUNTER — Other Ambulatory Visit: Payer: Self-pay

## 2017-11-20 DIAGNOSIS — Z8619 Personal history of other infectious and parasitic diseases: Secondary | ICD-10-CM | POA: Diagnosis not present

## 2017-11-20 DIAGNOSIS — A419 Sepsis, unspecified organism: Secondary | ICD-10-CM | POA: Diagnosis present

## 2017-11-20 DIAGNOSIS — Z825 Family history of asthma and other chronic lower respiratory diseases: Secondary | ICD-10-CM | POA: Diagnosis not present

## 2017-11-20 DIAGNOSIS — Z9089 Acquired absence of other organs: Secondary | ICD-10-CM | POA: Diagnosis not present

## 2017-11-20 DIAGNOSIS — I10 Essential (primary) hypertension: Secondary | ICD-10-CM

## 2017-11-20 DIAGNOSIS — Z872 Personal history of diseases of the skin and subcutaneous tissue: Secondary | ICD-10-CM | POA: Diagnosis not present

## 2017-11-20 DIAGNOSIS — Z96643 Presence of artificial hip joint, bilateral: Secondary | ICD-10-CM | POA: Diagnosis present

## 2017-11-20 DIAGNOSIS — C9110 Chronic lymphocytic leukemia of B-cell type not having achieved remission: Secondary | ICD-10-CM | POA: Diagnosis present

## 2017-11-20 DIAGNOSIS — E119 Type 2 diabetes mellitus without complications: Secondary | ICD-10-CM | POA: Diagnosis present

## 2017-11-20 DIAGNOSIS — Z8701 Personal history of pneumonia (recurrent): Secondary | ICD-10-CM | POA: Diagnosis not present

## 2017-11-20 DIAGNOSIS — Z8249 Family history of ischemic heart disease and other diseases of the circulatory system: Secondary | ICD-10-CM | POA: Diagnosis not present

## 2017-11-20 DIAGNOSIS — R197 Diarrhea, unspecified: Secondary | ICD-10-CM | POA: Diagnosis present

## 2017-11-20 DIAGNOSIS — Z801 Family history of malignant neoplasm of trachea, bronchus and lung: Secondary | ICD-10-CM | POA: Diagnosis not present

## 2017-11-20 DIAGNOSIS — D696 Thrombocytopenia, unspecified: Secondary | ICD-10-CM | POA: Diagnosis present

## 2017-11-20 DIAGNOSIS — Z6841 Body Mass Index (BMI) 40.0 and over, adult: Secondary | ICD-10-CM | POA: Diagnosis not present

## 2017-11-20 DIAGNOSIS — Z8042 Family history of malignant neoplasm of prostate: Secondary | ICD-10-CM | POA: Diagnosis not present

## 2017-11-20 DIAGNOSIS — L03311 Cellulitis of abdominal wall: Secondary | ICD-10-CM | POA: Diagnosis present

## 2017-11-20 DIAGNOSIS — Z86718 Personal history of other venous thrombosis and embolism: Secondary | ICD-10-CM | POA: Diagnosis not present

## 2017-11-20 DIAGNOSIS — Z87442 Personal history of urinary calculi: Secondary | ICD-10-CM | POA: Diagnosis not present

## 2017-11-20 DIAGNOSIS — D509 Iron deficiency anemia, unspecified: Secondary | ICD-10-CM | POA: Diagnosis present

## 2017-11-20 DIAGNOSIS — G4733 Obstructive sleep apnea (adult) (pediatric): Secondary | ICD-10-CM | POA: Diagnosis present

## 2017-11-20 DIAGNOSIS — Z833 Family history of diabetes mellitus: Secondary | ICD-10-CM | POA: Diagnosis not present

## 2017-11-20 DIAGNOSIS — Z9989 Dependence on other enabling machines and devices: Secondary | ICD-10-CM | POA: Diagnosis not present

## 2017-11-20 LAB — LACTIC ACID, PLASMA
LACTIC ACID, VENOUS: 1.1 mmol/L (ref 0.5–1.9)
Lactic Acid, Venous: 1.6 mmol/L (ref 0.5–1.9)

## 2017-11-20 LAB — C-REACTIVE PROTEIN: CRP: 1.4 mg/dL — ABNORMAL HIGH (ref <1.0)

## 2017-11-20 LAB — CBG MONITORING, ED: Glucose-Capillary: 221 mg/dL — ABNORMAL HIGH (ref 70–99)

## 2017-11-20 LAB — GLUCOSE, CAPILLARY
GLUCOSE-CAPILLARY: 159 mg/dL — AB (ref 70–99)
GLUCOSE-CAPILLARY: 127 mg/dL — AB (ref 70–99)
Glucose-Capillary: 171 mg/dL — ABNORMAL HIGH (ref 70–99)
Glucose-Capillary: 131 mg/dL — ABNORMAL HIGH (ref 70–99)

## 2017-11-20 LAB — SEDIMENTATION RATE: Sed Rate: 7 mm/hr (ref 0–16)

## 2017-11-20 LAB — BRAIN NATRIURETIC PEPTIDE: B NATRIURETIC PEPTIDE 5: 13 pg/mL (ref 0.0–100.0)

## 2017-11-20 LAB — PROCALCITONIN

## 2017-11-20 MED ORDER — INSULIN ASPART 100 UNIT/ML ~~LOC~~ SOLN
0.0000 [IU] | Freq: Three times a day (TID) | SUBCUTANEOUS | Status: DC
  Administered 2017-11-20: 1 [IU] via SUBCUTANEOUS
  Administered 2017-11-20 – 2017-11-21 (×3): 2 [IU] via SUBCUTANEOUS
  Administered 2017-11-21: 1 [IU] via SUBCUTANEOUS
  Administered 2017-11-22 (×2): 2 [IU] via SUBCUTANEOUS
  Administered 2017-11-22 – 2017-11-23 (×2): 1 [IU] via SUBCUTANEOUS

## 2017-11-20 MED ORDER — ONDANSETRON HCL 4 MG PO TABS: 4.0000 mg | ORAL_TABLET | Freq: Four times a day (QID) | ORAL | Status: DC | PRN

## 2017-11-20 MED ORDER — ADULT MULTIVITAMIN W/MINERALS CH
1.0000 | ORAL_TABLET | Freq: Every day | ORAL | Status: DC
  Administered 2017-11-20 – 2017-11-23 (×4): 1 via ORAL
  Filled 2017-11-20 (×4): qty 1

## 2017-11-20 MED ORDER — HYDROMORPHONE HCL 1 MG/ML IJ SOLN: 1.0000 mg | INTRAMUSCULAR | Status: DC | PRN

## 2017-11-20 MED ORDER — ZOLPIDEM TARTRATE 5 MG PO TABS
5.0000 mg | ORAL_TABLET | Freq: Every evening | ORAL | Status: DC | PRN
  Filled 2017-11-20: qty 1

## 2017-11-20 MED ORDER — VANCOMYCIN HCL 10 G IV SOLR
1500.0000 mg | Freq: Three times a day (TID) | INTRAVENOUS | Status: DC
  Administered 2017-11-20 – 2017-11-21 (×4): 1500 mg via INTRAVENOUS
  Filled 2017-11-20 (×5): qty 1500

## 2017-11-20 MED ORDER — INSULIN ASPART 100 UNIT/ML ~~LOC~~ SOLN
0.0000 [IU] | Freq: Every day | SUBCUTANEOUS | Status: DC
  Administered 2017-11-20: 2 [IU] via SUBCUTANEOUS
  Filled 2017-11-20: qty 1

## 2017-11-20 MED ORDER — ACETAMINOPHEN 650 MG RE SUPP: 650.0000 mg | Freq: Four times a day (QID) | RECTAL | Status: DC | PRN

## 2017-11-20 MED ORDER — ONDANSETRON HCL 4 MG/2ML IJ SOLN: 4.0000 mg | Freq: Four times a day (QID) | INTRAMUSCULAR | Status: DC | PRN

## 2017-11-20 MED ORDER — ASPIRIN EC 81 MG PO TBEC
81.0000 mg | DELAYED_RELEASE_TABLET | Freq: Every day | ORAL | Status: DC
  Administered 2017-11-20 – 2017-11-23 (×4): 81 mg via ORAL
  Filled 2017-11-20 (×4): qty 1

## 2017-11-20 MED ORDER — SODIUM CHLORIDE 0.9 % IV BOLUS
2500.0000 mL | Freq: Once | INTRAVENOUS | Status: AC
  Administered 2017-11-20: 2500 mL via INTRAVENOUS

## 2017-11-20 MED ORDER — ACETAMINOPHEN 325 MG PO TABS
650.0000 mg | ORAL_TABLET | Freq: Four times a day (QID) | ORAL | Status: DC | PRN
  Administered 2017-11-20: 650 mg via ORAL
  Filled 2017-11-20: qty 2

## 2017-11-20 MED ORDER — HYDROCODONE-HOMATROPINE 5-1.5 MG/5ML PO SYRP: 5.0000 mL | ORAL_SOLUTION | Freq: Every evening | ORAL | Status: DC | PRN

## 2017-11-20 MED ORDER — SODIUM CHLORIDE 0.9 % IV SOLN
2.0000 g | Freq: Every day | INTRAVENOUS | Status: DC
  Administered 2017-11-20 (×2): 2 g via INTRAVENOUS
  Filled 2017-11-20 (×3): qty 20

## 2017-11-20 MED ORDER — SODIUM CHLORIDE 0.9 % IV SOLN
INTRAVENOUS | Status: DC
  Administered 2017-11-20 – 2017-11-21 (×2): via INTRAVENOUS

## 2017-11-20 MED ORDER — OXYCODONE-ACETAMINOPHEN 5-325 MG PO TABS: 1.0000 | ORAL_TABLET | ORAL | Status: DC | PRN

## 2017-11-20 MED ORDER — METRONIDAZOLE IN NACL 5-0.79 MG/ML-% IV SOLN
500.0000 mg | Freq: Three times a day (TID) | INTRAVENOUS | Status: DC
  Administered 2017-11-20 – 2017-11-23 (×11): 500 mg via INTRAVENOUS
  Filled 2017-11-20 (×11): qty 100

## 2017-11-20 MED ORDER — HYDRALAZINE HCL 20 MG/ML IJ SOLN: 5.0000 mg | INTRAMUSCULAR | Status: DC | PRN

## 2017-11-20 MED ORDER — ENOXAPARIN SODIUM 100 MG/ML ~~LOC~~ SOLN
90.0000 mg | Freq: Every day | SUBCUTANEOUS | Status: DC
Start: 2017-11-20 — End: 2017-11-23
  Administered 2017-11-20 – 2017-11-22 (×3): 90 mg via SUBCUTANEOUS
  Filled 2017-11-20 (×2): qty 1
  Filled 2017-11-20: qty 0.9
  Filled 2017-11-20: qty 1

## 2017-11-20 MED ORDER — DICLOFENAC SODIUM 1 % TD GEL: 2.0000 g | Freq: Four times a day (QID) | TRANSDERMAL | Status: DC | PRN

## 2017-11-20 NOTE — Progress Notes (Signed)
Patient has home CPAP unit and needs no assistance with placing self on his unit.

## 2017-11-20 NOTE — Progress Notes (Signed)
Patient has home CPAP unit and needs no assistance with placing it on himself.

## 2017-11-20 NOTE — Progress Notes (Signed)
PROGRESS NOTE    Henry Hinton  TDS:[id number] DOB: 1960-10-26 DOA: 11/19/2017 PCP: [name], MD    Brief Narrative:  57 y.o. male with medical history significant of diabetes mellitus, CLL under observation, OSA on CPAP, morbid obesity, iron deficiency anemia, DVT, who presents with fever, chills, abdominal wall erythema and tenderness and diarrhea.  Pt states that he noted abdominal wall tenderness and erythema this afternoon, which has been rapidly worsening.  The pain is constant, severe, nonradiating.  He has fever and chills.  Temperature is 101.9 in ED.  Patient had mild cough and shortness of breath earlier, which have resolved.  Currently no chest pain, shortness breath or cough.  Patient states that he had mild left upper quadrant abdominal soreness, which has resolved.  Patient does not have nausea or vomiting, but has diarrhea.  Patient states that he had 5-6 times of loose stool diarrhea today, but he states that he did not have diarrhea since 8:00 PM (in the past 4 hours). Pt states that he was treated with antibiotics for pneumonia in May, but did not use antibiotics since then. Pt denies symptoms of UTI or unilateral weakness.  He states that he is taking Lasix 80 mg daily for leg edema, not for CHF.  He denies history of CHF.   ED Course: pt was found to have WBC 70.7 with lymphocyte 55.9% and neutrophil 13.4%, lactic acid 2.46, negative urinalysis, creatinine 1.13, GFR> 60, temperature 101.9, tachycardia, tachypnea, oxygen saturation 92 to 96% on room air, negative chest x-ray.  Patient is admitted to telemetry bed as inpatient.   Assessment & Plan:   Principal Problem:   Abdominal wall cellulitis Active Problems:   CLL (chronic lymphocytic leukemia) (HCC)   Sepsis (HCC)   Essential hypertension   OSA (obstructive sleep apnea)   Diabetes mellitus without complication (HCC)   Iron deficiency anemia   Diarrhea  Sepsis due to abdominal wall cellulitis, present  on admission:  - Patient is a septic obese WBC 70.7, fever, tachycardia and tachypnea.  Lactic acid elevated at 2.46. -Blood cultures ordered, pending -Given immunosuppression, have continued patient on broad spectrum coverage (Vancomycin, rocephin, flagyl)  -Have outlined area of cellulitis -Anticipate narrowing abx coverage as patient improves -Repeat cbc in AM  CLL (chronic lymphocytic leukemia) ([place]):   -Presenting WBC of 70.7 with lymphocyte 55.9% and neutrophil 13.4%.  -Pt is followed up with Dr. [name] in [facility].   -Patient is on observation, no chemotherapy. -Repeat cbc in AM  Essential hypertension: -Continue PRN hydralazine -Repeat bmet in AM  OSA (obstructive sleep apnea): -continued on cpap as tolerated  Diabetes mellitus without complication ([name]):   -Most recent A1c of 8.6 on 09/28/17, poorly controled.  -On metformin prior to admit -Contnue SSI as needed  Iron deficiency anemia:  -Presenting hemoglobin stable, 13.5 -Will continue iron supplement as tolerated  Diarrhea:  -No further diarrhea since 8:00 PM on 7/22.  -Continue to monitor -IVF as above  DVT prophylaxis: Lovenox subQ Code Status: Full Family Communication: Pt in room, family not at bedside Disposition Plan: Uncertain at this time  Consultants:     Procedures:     Antimicrobials: Anti-infectives (From admission, onward)   Start     Dose/Rate Route Frequency Ordered Stop   11/20/17 0600  vancomycin (VANCOCIN) 1,500 mg in sodium chloride 0.9 % 500 mL IVPB     1,500 mg 250 mL/hr over 120 Minutes Intravenous Every 8 hours 11/20/17 0007     11/20/17  0200  metroNIDAZOLE (FLAGYL) IVPB 500 mg     500 mg 100 mL/hr over 60 Minutes Intravenous Every 8 hours 11/20/17 0112     11/20/17 0130  cefTRIAXone (ROCEPHIN) 2 g in sodium chloride 0.9 % 100 mL IVPB     2 g 200 mL/hr over 30 Minutes Intravenous Daily at bedtime 11/20/17 0112     11/19/17 2300  vancomycin (VANCOCIN)  2,000 mg in sodium chloride 0.9 % 500 mL IVPB     2,000 mg 250 mL/hr over 120 Minutes Intravenous  Once 11/19/17 2253 11/20/17 0134       Subjective: Reports feeling somewhat better today  Objective: Vitals:   11/20/17 0330 11/20/17 0345 11/20/17 0424 11/20/17 1349  BP: 136/68 140/69 (!) 169/79 137/67  Pulse: 72 67 73 63  Resp: (!) 21 (!) 23 20 20   Temp:   98.8 F (37.1 C) 98.9 F (37.2 C)  TempSrc:   Oral Oral  SpO2: 98% 98% 97% 96%  Weight:   (!) 174.2 kg (384 lb)   Height:   5\' 9"  (1.753 m)     Intake/Output Summary (Last 24 hours) at 11/20/2017 1502 Last data filed at 11/20/2017 0346 Gross per 24 hour  Intake 1694.14 ml  Output -  Net 1694.14 ml   Filed Weights   11/19/17 2112 11/20/17 0424  Weight: (!) 174.6 kg (385 lb) (!) 174.2 kg (384 lb)    Examination:  General exam: Appears calm and comfortable  Respiratory system: Clear to auscultation. Respiratory effort normal. Cardiovascular system: S1 & S2 heard, RRR. Gastrointestinal system: Abdomen is nondistended, soft and nontender. No organomegaly or masses felt. Normal bowel sounds heard. Central nervous system: Alert and oriented. No focal neurological deficits. Extremities: Symmetric 5 x 5 power. Skin: Large patch of tender erythema over lower abdomen with no active drainage Psychiatry: Judgement and insight appear normal. Mood & affect appropriate.   Data Reviewed: I have personally reviewed following labs and imaging studies  CBC: Recent Labs  Lab 11/19/17 2126  WBC 70.7*  NEUTROABS 13.4*  HGB 13.5  HCT 44.4  MCV 89.5  PLT 034   Basic Metabolic Panel: Recent Labs  Lab 11/19/17 2126  NA 139  K 5.1  CL 103  CO2 24  GLUCOSE 158*  BUN 18  CREATININE 1.13  CALCIUM 9.0   GFR: Estimated Creatinine Clearance: 114.4 mL/min (by C-G formula based on SCr of 1.13 mg/dL). Liver Function Tests: Recent Labs  Lab 11/19/17 2126  AST 30  ALT 24  ALKPHOS 122  BILITOT 0.5  PROT 7.1  ALBUMIN 3.9     No results for input(s): LIPASE, AMYLASE in the last 168 hours. No results for input(s): AMMONIA in the last 168 hours. Coagulation Profile: No results for input(s): INR, PROTIME in the last 168 hours. Cardiac Enzymes: No results for input(s): CKTOTAL, CKMB, CKMBINDEX, TROPONINI in the last 168 hours. BNP (last 3 results) No results for input(s): PROBNP in the last 8760 hours. HbA1C: No results for input(s): HGBA1C in the last 72 hours. CBG: Recent Labs  Lab 11/20/17 0208 11/20/17 0809 11/20/17 1219  GLUCAP 221* 159* 171*   Lipid Profile: No results for input(s): CHOL, HDL, LDLCALC, TRIG, CHOLHDL, LDLDIRECT in the last 72 hours. Thyroid Function Tests: No results for input(s): TSH, T4TOTAL, FREET4, T3FREE, THYROIDAB in the last 72 hours. Anemia Panel: No results for input(s): VITAMINB12, FOLATE, FERRITIN, TIBC, IRON, RETICCTPCT in the last 72 hours. Sepsis Labs: Recent Labs  Lab 11/19/17 2202 11/20/17 0129 11/20/17  0335  PROCALCITON  --  <0.10  --   LATICACIDVEN 2.46* 1.6 1.1    Recent Results (from the past 240 hour(s))  Blood culture (routine x 2)     Status: None (Preliminary result)   Collection Time: 11/19/17  9:15 PM  Result Value Ref Range Status   Specimen Description BLOOD RIGHT ARM  Final   Special Requests   Final    BOTTLES DRAWN AEROBIC AND ANAEROBIC Blood Culture results may not be optimal due to an excessive volume of blood received in culture bottles   Culture   Final    NO GROWTH < 24 HOURS Performed at [facility] [address] [place], [place] [address]    Report Status PENDING  Incomplete  Blood culture (routine x 2)     Status: None (Preliminary result)   Collection Time: 11/19/17  9:30 PM  Result Value Ref Range Status   Specimen Description BLOOD LEFT ARM  Final   Special Requests   Final    BOTTLES DRAWN AEROBIC ONLY Blood Culture results may not be optimal due to an excessive volume of blood received in culture bottles    Culture   Final    NO GROWTH < 24 HOURS Performed at [facility], [place] [address] [place], [place] [address]    Report Status PENDING  Incomplete     Radiology Studies: Dg Chest 2 View  Result Date: 11/19/2017 CLINICAL DATA:  Shortness of breath EXAM: CHEST - 2 VIEW COMPARISON:  09/28/2017 FINDINGS: The heart size and mediastinal contours are within normal limits. Both lungs are clear. The visualized skeletal structures are unremarkable. IMPRESSION: No active cardiopulmonary disease. Electronically Signed   By: Ulyses Jarred M.D.   On: 11/19/2017 22:00    Scheduled Meds: . aspirin EC  81 mg Oral Daily  . enoxaparin (LOVENOX) injection  90 mg Subcutaneous Daily  . insulin aspart  0-5 Units Subcutaneous QHS  . insulin aspart  0-9 Units Subcutaneous TID WC  . multivitamin with minerals  1 tablet Oral Daily   Continuous Infusions: . sodium chloride 75 mL/hr at 11/20/17 0432  . cefTRIAXone (ROCEPHIN)  IV Stopped (11/20/17 0346)  . metronidazole 500 mg (11/20/17 0939)  . vancomycin 1,500 mg (11/20/17 1332)     LOS: 0 days   Marylu Lund, MD [facility] Pager [phone]  If 7PM-7AM, please contact night-coverage [email or web address] Password [facility] 11/20/2017, 3:02 PM

## 2017-11-21 LAB — CBC
HCT: 36.6 % — ABNORMAL LOW (ref 39.0–52.0)
Hemoglobin: 11 g/dL — ABNORMAL LOW (ref 13.0–17.0)
MCH: 27 pg (ref 26.0–34.0)
MCHC: 30.1 g/dL (ref 30.0–36.0)
MCV: 89.7 fL (ref 78.0–100.0)
PLATELETS: 127 10*3/uL — AB (ref 150–400)
RBC: 4.08 MIL/uL — ABNORMAL LOW (ref 4.22–5.81)
RDW: 16.4 % — AB (ref 11.5–15.5)
WBC: 45 10*3/uL — AB (ref 4.0–10.5)

## 2017-11-21 LAB — BASIC METABOLIC PANEL
Anion gap: 7 (ref 5–15)
BUN: 8 mg/dL (ref 6–20)
CALCIUM: 8 mg/dL — AB (ref 8.9–10.3)
CHLORIDE: 108 mmol/L (ref 98–111)
CO2: 25 mmol/L (ref 22–32)
CREATININE: 0.61 mg/dL (ref 0.61–1.24)
GFR calc non Af Amer: >60 mL/min (ref >60)
Glucose, Bld: 185 mg/dL — ABNORMAL HIGH (ref 70–99)
Potassium: 4.3 mmol/L (ref 3.5–5.1)
SODIUM: 140 mmol/L (ref 135–145)

## 2017-11-21 LAB — VANCOMYCIN, TROUGH: VANCOMYCIN TR: 20 ug/mL (ref 15–20)

## 2017-11-21 LAB — PATHOLOGIST SMEAR REVIEW

## 2017-11-21 LAB — GLUCOSE, CAPILLARY
GLUCOSE-CAPILLARY: 144 mg/dL — AB (ref 70–99)
Glucose-Capillary: 199 mg/dL — ABNORMAL HIGH (ref 70–99)
Glucose-Capillary: 96 mg/dL (ref 70–99)
Glucose-Capillary: 167 mg/dL — ABNORMAL HIGH (ref 70–99)

## 2017-11-21 LAB — MRSA PCR SCREENING: MRSA by PCR: NEGATIVE

## 2017-11-21 MED ORDER — SODIUM CHLORIDE 0.9 % IV SOLN
1250.0000 mg | Freq: Three times a day (TID) | INTRAVENOUS | Status: DC
  Administered 2017-11-21: 1250 mg via INTRAVENOUS
  Filled 2017-11-21 (×3): qty 1250

## 2017-11-21 MED ORDER — CEFAZOLIN SODIUM-DEXTROSE 2-4 GM/100ML-% IV SOLN
2.0000 g | Freq: Three times a day (TID) | INTRAVENOUS | Status: DC
  Administered 2017-11-21 – 2017-11-23 (×5): 2 g via INTRAVENOUS
  Filled 2017-11-21 (×7): qty 100

## 2017-11-21 NOTE — Progress Notes (Signed)
Pharmacy Antibiotic Note  Henry Hinton is a 57 y.o. male admitted on 11/19/2017 with cellulitis.  Pharmacy has been consulted for Vancomycin dosing. CLL is likely causing markedly elevated WBC. Renal function good. Pt is also on ceftriaxone and flagyl.   Vancomycin trough today is at higher end of goal at 20. Pt is afebrile, procal <0.1, lactic acid down to 1.1, WBC and Scr down trending. Blood cultures NGTD.   Plan: Decrease Vancomycin dose to 1250 mg IV q8h to target lower goal and prevent accumulation Goal trough 10-15 Trend WBC, temp, renal function  F/U infectious work-up Check Vancomycin trough at steady state   Height: 5\' 9"  (175.3 cm) Weight: (!) 384 lb (174.2 kg) IBW/kg (Calculated) : 70.7  Temp (24hrs), Avg:98.8 F (37.1 C), Min:98.7 F (37.1 C), Max:98.9 F (37.2 C)  Recent Labs  Lab 11/19/17 2126 11/19/17 2202 11/20/17 0129 11/20/17 0335 11/21/17 0525  WBC 70.7*  --   --   --  45.0*  CREATININE 1.13  --   --   --  0.61  LATICACIDVEN  --  2.46* 1.6 1.1  --   VANCOTROUGH  --   --   --   --  20    Estimated Creatinine Clearance: 161.5 mL/min (by C-G formula based on SCr of 0.61 mg/dL).    Allergies  Allergen Reactions   Adhesive [Tape] Other (See Comments)    Blisters and rash at site    Juanell Fairly, PharmD PGY1 Pharmacy Resident Phone [phone] 11/21/2017 8:50 AM

## 2017-11-21 NOTE — Progress Notes (Signed)
PROGRESS NOTE  Henry Hinton UXN:[id number] DOB: 24-Jun-1960 DOA: 11/19/2017 PCP: [name], MD  HPI/Recap of past 24 hours:  Feeling better, no fever, denies pain  Assessment/Plan: Principal Problem:   Abdominal wall cellulitis Active Problems:   CLL (chronic lymphocytic leukemia) (HCC)   Sepsis ([place])   Essential hypertension   OSA (obstructive sleep apnea)   Diabetes mellitus without complication (HCC)   Iron deficiency anemia   Diarrhea  Sepsis due to abdominal wall cellulitis, present on admission: -with Recurrent abdominal wall cellulitis at the same location - Patient is a septic obese WBC 70.7, fever101.9, tachycardia and tachypnea. Lactic acid elevated at 2.46. No drainage, no open wound, does not appear to have abscess  - patient has been on broad spectrum coverage (Vancomycin, rocephin, flagyl) Given immunosuppression, -Blood cultures no growth, fever resolved, wbc back to baseline, mrsa screening negative -d/c vanc and rocephin, narrow abx to ancef, continue flagyl due to location to cover anaerobes  -d/c ivf, continue hold home meds lasix  CLL (chronic lymphocytic leukemia) ([place]): -Presenting WBCof 70.7withlymphocyte 55.9% andneutrophil 13.4%.  -[name]  -Patient is on observation, no chemotherapy. -wbc back to baseline  normocytic anemia: -Presenting hemoglobin stable, 13.5 -on iron supplement  at home which is continued   Thrombocytopenia: -plt 127 close to baseline -monitor  Diabetes mellitus without complication (HCC):noninsulin dependent  -Most recent A1c of8.6 on 09/28/17, poorly controled.  -On metforminprior to admit -Contnue SSI as needed  Essential hypertension: -Continue PRN hydralazine -he is only on lasix at home, lasix held since admission due to sepsis, likely able to resume in am  OSA (obstructive sleep apnea): Body mass index is 56.71 kg/m. meet morbid  obesity criteria -continued on cpap as tolerated    Diarrhea:  -No further diarrhea since 8:00PM on 7/22.  -Continue to monitor  Tele unremarkable, patient is improving, d/c tele  DVT prophylaxis: Lovenox subQ Code Status: Full Family Communication: Pt in room, family not at bedside Disposition Plan:home hopefully on 7/26   Consultants:  none  Procedures:  none  Antibiotics:  As above   Objective: BP 136/61 (BP Location: Right Wrist)   Pulse 60   Temp 98.8 F (37.1 C) (Oral)   Resp 18   Ht 5\' 9"  (1.753 m)   Wt (!) 174.2 kg (384 lb)   SpO2 97%   BMI 56.71 kg/m   Intake/Output Summary (Last 24 hours) at 11/21/2017 1327 Last data filed at 11/21/2017 1100 Gross per 24 hour  Intake 3796.16 ml  Output -  Net 3796.16 ml   Filed Weights   11/19/17 2112 11/20/17 0424  Weight: (!) 174.6 kg (385 lb) (!) 174.2 kg (384 lb)    Exam: Patient is examined daily including today on 11/21/2017, exams remain the same as of yesterday except that has changed    General:  NAD, obese   Cardiovascular: RRR  Respiratory: CTABL  Abdomen: Soft/ND/NT, positive BS, abdominal wall cellulitis, no drainage  Musculoskeletal: No Edema  Neuro: alert, oriented   Data Reviewed: Basic Metabolic Panel: Recent Labs  Lab 11/19/17 2126 11/21/17 0525  NA 139 140  K 5.1 4.3  CL 103 108  CO2 24 25  GLUCOSE 158* 185*  BUN 18 8  CREATININE 1.13 0.61  CALCIUM 9.0 8.0*   Liver Function Tests: Recent Labs  Lab 11/19/17 2126  AST 30  ALT 24  ALKPHOS 122  BILITOT 0.5  PROT 7.1  ALBUMIN 3.9  No results for input(s): LIPASE, AMYLASE in the last 168 hours. No results for input(s): AMMONIA in the last 168 hours. CBC: Recent Labs  Lab 11/19/17 2126 11/21/17 0525  WBC 70.7* 45.0*  NEUTROABS 13.4*  --   HGB 13.5 11.0*  HCT 44.4 36.6*  MCV 89.5 89.7  PLT 191 127*   Cardiac Enzymes:   No results for input(s): CKTOTAL, CKMB, CKMBINDEX, TROPONINI in the last 168  hours. BNP (last 3 results) Recent Labs    11/20/17 0129  BNP 13.0    ProBNP (last 3 results) No results for input(s): PROBNP in the last 8760 hours.  CBG: Recent Labs  Lab 11/20/17 1219 11/20/17 1704 11/20/17 2124 11/21/17 0804 11/21/17 1208  GLUCAP 171* 131* 127* 144* 199*    Recent Results (from the past 240 hour(s))  Blood culture (routine x 2)     Status: None (Preliminary result)   Collection Time: 11/19/17  9:15 PM  Result Value Ref Range Status   Specimen Description BLOOD RIGHT ARM  Final   Special Requests   Final    BOTTLES DRAWN AEROBIC AND ANAEROBIC Blood Culture results may not be optimal due to an excessive volume of blood received in culture bottles   Culture   Final    NO GROWTH < 24 HOURS Performed at [facility] [address] [place], [place] [address]    Report Status PENDING  Incomplete  Blood culture (routine x 2)     Status: None (Preliminary result)   Collection Time: 11/19/17  9:30 PM  Result Value Ref Range Status   Specimen Description BLOOD LEFT ARM  Final   Special Requests   Final    BOTTLES DRAWN AEROBIC ONLY Blood Culture results may not be optimal due to an excessive volume of blood received in culture bottles   Culture   Final    NO GROWTH < 24 HOURS Performed at [facility], [place] [address] [place],  [address]    Report Status PENDING  Incomplete     Studies: No results found.  Scheduled Meds: . aspirin EC  81 mg Oral Daily  . enoxaparin (LOVENOX) injection  90 mg Subcutaneous Daily  . insulin aspart  0-5 Units Subcutaneous QHS  . insulin aspart  0-9 Units Subcutaneous TID WC  . multivitamin with minerals  1 tablet Oral Daily    Continuous Infusions: . sodium chloride 75 mL/hr at 11/21/17 1209  . cefTRIAXone (ROCEPHIN)  IV Stopped (11/20/17 2258)  . metronidazole 100 mL/hr at 11/21/17 1100  . vancomycin       Time spent: 51mins I have personally reviewed and interpreted on  11/21/2017 daily  labs, tele strips, imagings as discussed above under date review session and assessment and plans.  I reviewed all nursing notes, pharmacy notes,   vitals, pertinent old records  I have discussed plan of care as described above with RN , patient  on 11/21/2017   Florencia Reasons MD, PhD  [facility] Pager [phone]. If 7PM-7AM, please contact night-coverage at [email or web address], password [facility] 11/21/2017, 1:27 PM  LOS: 1 day

## 2017-11-22 LAB — CBC
HCT: 39.6 % (ref 39.0–52.0)
Hemoglobin: 12 g/dL — ABNORMAL LOW (ref 13.0–17.0)
MCH: 27.1 pg (ref 26.0–34.0)
MCHC: 30.3 g/dL (ref 30.0–36.0)
MCV: 89.4 fL (ref 78.0–100.0)
PLATELETS: 156 10*3/uL (ref 150–400)
RBC: 4.43 MIL/uL (ref 4.22–5.81)
RDW: 16.3 % — AB (ref 11.5–15.5)
WBC: 52 10*3/uL (ref 4.0–10.5)

## 2017-11-22 LAB — GLUCOSE, CAPILLARY
GLUCOSE-CAPILLARY: 149 mg/dL — AB (ref 70–99)
GLUCOSE-CAPILLARY: 157 mg/dL — AB (ref 70–99)
Glucose-Capillary: 182 mg/dL — ABNORMAL HIGH (ref 70–99)
Glucose-Capillary: 136 mg/dL — ABNORMAL HIGH (ref 70–99)

## 2017-11-22 LAB — BASIC METABOLIC PANEL
Anion gap: 8 (ref 5–15)
BUN: 8 mg/dL (ref 6–20)
CO2: 24 mmol/L (ref 22–32)
CREATININE: 0.68 mg/dL (ref 0.61–1.24)
Calcium: 8.3 mg/dL — ABNORMAL LOW (ref 8.9–10.3)
Chloride: 107 mmol/L (ref 98–111)
GFR calc Af Amer: >60 mL/min (ref >60)
GLUCOSE: 153 mg/dL — AB (ref 70–99)
POTASSIUM: 4.2 mmol/L (ref 3.5–5.1)
Sodium: 139 mmol/L (ref 135–145)

## 2017-11-22 MED ORDER — FUROSEMIDE 40 MG PO TABS
40.0000 mg | ORAL_TABLET | Freq: Every day | ORAL | Status: DC
  Administered 2017-11-22: 40 mg via ORAL
  Filled 2017-11-22: qty 1

## 2017-11-22 NOTE — Progress Notes (Signed)
PROGRESS NOTE  Henry Hinton ZMO:[id number] DOB: [birth year] DOA: 11/19/2017 PCP: [name], MD  HPI/Recap of past 24 hours:  Continue to improve, no fever, denies pain Reports legs start to swell   Assessment/Plan: Principal Problem:   Abdominal wall cellulitis Active Problems:   CLL (chronic lymphocytic leukemia) (HCC)   Sepsis ([place])   Essential hypertension   OSA (obstructive sleep apnea)   Diabetes mellitus without complication (HCC)   Iron deficiency anemia   Diarrhea  Sepsis due to abdominal wall cellulitis, present on admission: -with Recurrent abdominal wall cellulitis at the same location - Patient is a septic on presentation with WBC 70.7, fever101.9, tachycardia and tachypnea. Lactic acid elevated at 2.46. No drainage, no open wound, does not appear to have abscess  - patient has been on broad spectrum coverage (Vancomycin, rocephin, flagyl) Given immunosuppression, -Blood cultures no growth, fever resolved, wbc back to baseline, mrsa screening negative -d/c vanc and rocephin, narrow abx to ancef, continue flagyl due to location to cover anaerobes  -continue to improve, likely able to d/c home tomorrow with augmentin  CLL (chronic lymphocytic leukemia) ([place]): -Presenting WBCof 70.7withlymphocyte 55.9% andneutrophil 13.4%.  -[name]  -Patient is on observation, no chemotherapy. -wbc back to baseline  normocytic anemia: -Presenting hemoglobin stable, 13.5 -on iron supplement  at home which is continued   Thrombocytopenia: -plt 127 close to baseline -monitor  Diabetes mellitus without complication (HCC):noninsulin dependent  -Most recent A1c of8.6 on 09/28/17, poorly controled.  -On metforminprior to admit -Contnue SSI as needed  Essential hypertension: -Continue PRN hydralazine -he is only on lasix at home, lasix held since admission due to sepsis -resume lasix at a lower dose  today  OSA (obstructive sleep apnea): Body mass index is 56.71 kg/m. meet morbid obesity criteria -continued on cpap as tolerated    Diarrhea:  -No further diarrhea since 8:00PM on 7/22.  -Continue to monitor    DVT prophylaxis: Lovenox subQ Code Status: Full Family Communication: Pt in room, family not at bedside Disposition Plan:home hopefully on 7/26   Consultants:  none  Procedures:  none  Antibiotics:  As above   Objective: BP (!) 142/72 (BP Location: Right Arm)   Pulse 79   Temp 98.2 F (36.8 C) (Oral)   Resp 18   Ht 5\' 9"  (1.753 m)   Wt (!) 174.2 kg (384 lb)   SpO2 96%   BMI 56.71 kg/m   Intake/Output Summary (Last 24 hours) at 11/22/2017 1124 Last data filed at 11/21/2017 1700 Gross per 24 hour  Intake 563.56 ml  Output -  Net 563.56 ml   Filed Weights   11/19/17 2112 11/20/17 0424  Weight: (!) 174.6 kg (385 lb) (!) 174.2 kg (384 lb)    Exam: Patient is examined daily including today on 11/22/2017, exams remain the same as of yesterday except that has changed    General:  NAD, obese   Cardiovascular: RRR  Respiratory: CTABL  Abdomen: Soft/ND/NT, positive BS, abdominal wall cellulitis less erythematous, no drainage  Musculoskeletal: No Edema  Neuro: alert, oriented   Data Reviewed: Basic Metabolic Panel: Recent Labs  Lab 11/19/17 2126 11/21/17 0525 11/22/17 0613  NA 139 140 139  K 5.1 4.3 4.2  CL 103 108 107  CO2 24 25 24   GLUCOSE 158* 185* 153*  BUN 18 8 8   CREATININE 1.13 0.61 0.68  CALCIUM 9.0 8.0* 8.3*   Liver Function Tests: Recent Labs  Lab 11/19/17  2126  AST 30  ALT 24  ALKPHOS 122  BILITOT 0.5  PROT 7.1  ALBUMIN 3.9   No results for input(s): LIPASE, AMYLASE in the last 168 hours. No results for input(s): AMMONIA in the last 168 hours. CBC: Recent Labs  Lab 11/19/17 2126 11/21/17 0525 11/22/17 0613  WBC 70.7* 45.0* 52.0*  NEUTROABS 13.4*  --   --   HGB 13.5 11.0* 12.0*  HCT 44.4 36.6*  39.6  MCV 89.5 89.7 89.4  PLT 191 127* 156   Cardiac Enzymes:   No results for input(s): CKTOTAL, CKMB, CKMBINDEX, TROPONINI in the last 168 hours. BNP (last 3 results) Recent Labs    11/20/17 0129  BNP 13.0    ProBNP (last 3 results) No results for input(s): PROBNP in the last 8760 hours.  CBG: Recent Labs  Lab 11/21/17 0804 11/21/17 1208 11/21/17 1644 11/21/17 2128 11/22/17 0738  GLUCAP 144* 199* 96 167* 149*    Recent Results (from the past 240 hour(s))  Blood culture (routine x 2)     Status: None (Preliminary result)   Collection Time: 11/19/17  9:15 PM  Result Value Ref Range Status   Specimen Description BLOOD RIGHT ARM  Final   Special Requests   Final    BOTTLES DRAWN AEROBIC AND ANAEROBIC Blood Culture results may not be optimal due to an excessive volume of blood received in culture bottles   Culture   Final    NO GROWTH 2 DAYS Performed at [facility], [place] [address] [place], [place] [address]    Report Status PENDING  Incomplete  Blood culture (routine x 2)     Status: None (Preliminary result)   Collection Time: 11/19/17  9:30 PM  Result Value Ref Range Status   Specimen Description BLOOD LEFT ARM  Final   Special Requests   Final    BOTTLES DRAWN AEROBIC ONLY Blood Culture results may not be optimal due to an excessive volume of blood received in culture bottles   Culture   Final    NO GROWTH 2 DAYS Performed at [facility], [place] [address] [place], [place] [address]    Report Status PENDING  Incomplete  MRSA PCR Screening     Status: None   Collection Time: 11/21/17  2:14 PM  Result Value Ref Range Status   MRSA by PCR NEGATIVE NEGATIVE Final    Comment:        The GeneXpert MRSA Assay (FDA approved for NASAL specimens only), is one component of a comprehensive MRSA colonization surveillance program. It is not intended to diagnose MRSA infection nor to guide or monitor treatment for MRSA infections. Performed at [facility], [place] [address], [place], [place] [address]      Studies: No results found.  Scheduled Meds: . aspirin EC  81 mg Oral Daily  . enoxaparin (LOVENOX) injection  90 mg Subcutaneous Daily  . furosemide  40 mg Oral Daily  . insulin aspart  0-5 Units Subcutaneous QHS  . insulin aspart  0-9 Units Subcutaneous TID WC  . multivitamin with minerals  1 tablet Oral Daily    Continuous Infusions: .  ceFAZolin (ANCEF) IV 2 g (11/22/17 0413)  . metronidazole 500 mg (11/22/17 1022)     Time spent: 37mins I have personally reviewed and interpreted on  11/22/2017 daily labs,  imagings as discussed above under date review session and assessment and plans.  I reviewed all nursing notes, pharmacy notes,  vitals, pertinent old records  I have discussed plan of care as described above with RN , patient  on 11/22/2017   Florencia Reasons MD, PhD  [facility] Pager [phone]. If 7PM-7AM, please contact night-coverage at [email or web address], password [facility] 11/22/2017, 11:24 AM  LOS: 2 days

## 2017-11-22 NOTE — Progress Notes (Signed)
RT NOTE:  Pt has home CPAP @ bedside. No RT assistance needed.

## 2017-11-23 LAB — BASIC METABOLIC PANEL
ANION GAP: 9 (ref 5–15)
BUN: 10 mg/dL (ref 6–20)
CALCIUM: 8.1 mg/dL — AB (ref 8.9–10.3)
CHLORIDE: 107 mmol/L (ref 98–111)
CO2: 25 mmol/L (ref 22–32)
CREATININE: 0.66 mg/dL (ref 0.61–1.24)
GFR calc non Af Amer: >60 mL/min (ref >60)
Glucose, Bld: 142 mg/dL — ABNORMAL HIGH (ref 70–99)
Potassium: 4.7 mmol/L (ref 3.5–5.1)
SODIUM: 141 mmol/L (ref 135–145)

## 2017-11-23 LAB — CBC
HEMATOCRIT: 36.1 % — AB (ref 39.0–52.0)
HEMOGLOBIN: 11.2 g/dL — AB (ref 13.0–17.0)
MCH: 27.7 pg (ref 26.0–34.0)
MCHC: 31 g/dL (ref 30.0–36.0)
MCV: 89.1 fL (ref 78.0–100.0)
Platelets: 149 10*3/uL — ABNORMAL LOW (ref 150–400)
RBC: 4.05 MIL/uL — ABNORMAL LOW (ref 4.22–5.81)
RDW: 16 % — ABNORMAL HIGH (ref 11.5–15.5)
WBC: 44.4 10*3/uL — ABNORMAL HIGH (ref 4.0–10.5)

## 2017-11-23 LAB — MAGNESIUM: MAGNESIUM: 2 mg/dL (ref 1.7–2.4)

## 2017-11-23 LAB — GLUCOSE, CAPILLARY: GLUCOSE-CAPILLARY: 145 mg/dL — AB (ref 70–99)

## 2017-11-23 MED ORDER — SACCHAROMYCES BOULARDII 250 MG PO CAPS: 250.0000 mg | ORAL_CAPSULE | Freq: Two times a day (BID) | ORAL | 0 refills | Status: AC

## 2017-11-23 MED ORDER — AMOXICILLIN-POT CLAVULANATE 875-125 MG PO TABS: 1.0000 | ORAL_TABLET | Freq: Two times a day (BID) | ORAL | 0 refills | Status: AC

## 2017-11-23 NOTE — Discharge Summary (Signed)
Discharge Summary  Henry Hinton SEG:[id number] DOB: [birth year]  PCP: [name], MD  Admit date: 11/19/2017 Discharge date: 11/23/2017  Time spent: 72mins  Recommendations for Outpatient Follow-up:  1. F/u with PMD within a week  for hospital discharge follow up, repeat cbc/bmp at follow up. 2. F/u with hematology/oncology for cll  Discharge Diagnoses:  Active Hospital Problems   Diagnosis Date Noted  . Abdominal wall cellulitis 01/22/2017  . Diarrhea 11/20/2017  . Diabetes mellitus without complication ([place]) 73/71/0626  . Iron deficiency anemia 11/19/2017  . OSA (obstructive sleep apnea) 01/22/2017  . Essential hypertension   . Sepsis ([place]) 10/09/2015  . CLL (chronic lymphocytic leukemia) ([place]) 05/27/2015    Resolved Hospital Problems  No resolved problems to display.    Discharge Condition: stable  Diet recommendation: heart healthy/carb modified  Filed Weights   11/19/17 2112 11/20/17 0424  Weight: (!) 174.6 kg (385 lb) (!) 174.2 kg (384 lb)    History of present illness: (per admitting MD Dr Blaine Hamper) PCP: [name], MD   Patient coming from:  The patient is coming from home.  At baseline, pt is independent for most of ADL.       Chief Complaint: Fever, chills, abdominal wall erythema and tenderness, diarrhea  HPI: Henry Hinton is a 57 y.o. male with medical history significant of diabetes mellitus, CLL under observation, OSA on CPAP, morbid obesity, iron deficiency anemia, DVT, who presents with fever, chills, abdominal wall erythema and tenderness and diarrhea.  Pt states that he noted abdominal wall tenderness and erythema this afternoon, which has been rapidly worsening.  The pain is constant, severe, nonradiating.  He has fever and chills.  Temperature is 101.9 in ED.  Patient had mild cough and shortness of breath earlier, which have resolved.  Currently no chest pain, shortness breath or cough.  Patient states that he had mild left upper  quadrant abdominal soreness, which has resolved.  Patient does not have nausea or vomiting, but has diarrhea.  Patient states that he had 5-6 times of loose stool diarrhea today, but he states that he did not have diarrhea since 8:00 PM (in the past 4 hours). Pt states that he was treated with antibiotics for pneumonia in May, but did not use antibiotics since then. Pt denies symptoms of UTI or unilateral weakness.  He states that he is taking Lasix 80 mg daily for leg edema, not for CHF.  He denies history of CHF.   ED Course: pt was found to have WBC 70.7 with lymphocyte 55.9% and neutrophil 13.4%, lactic acid 2.46, negative urinalysis, creatinine 1.13, GFR> 60, temperature 101.9, tachycardia, tachypnea, oxygen saturation 92 to 96% on room air, negative chest x-ray.  Patient is admitted to telemetry bed as inpatient.    Hospital Course:  Principal Problem:   Abdominal wall cellulitis Active Problems:   CLL (chronic lymphocytic leukemia) (HCC)   Sepsis (HCC)   Essential hypertension   OSA (obstructive sleep apnea)   Diabetes mellitus without complication (HCC)   Iron deficiency anemia   Diarrhea   Sepsis due to abdominal wall cellulitis, present on admission: -with Recurrent abdominal wall cellulitis at the same location -Patient is a septic on presentation with WBC 70.7, fever101.9, tachycardia and tachypnea. Lactic acid elevated at 2.46. No drainage, no open wound, does not appear to have abscess  - patient has been on broad spectrum coverage (Vancomycin, rocephin, flagyl) Given immunosuppression, -Blood cultures no growth, fever resolved, wbc back to baseline, mrsa screening  negative -d/c vanc and rocephin, narrow abx to ancef, continue flagyl due to location to cover anaerobes  -continue to improve,  d/c home with augmentin  CLL (chronic lymphocytic leukemia) ([place]): -PresentingWBCof70.7withlymphocyte 55.9% andneutrophil 13.4%. -[name] -Patient is on observation, no chemotherapy. -wbc  44 back to baseline at discharge  normocytic anemia: -Presenting hemoglobin stable, 13.5 -on iron supplement at home which is continued   Thrombocytopenia: -plt 127 close to baseline -plt 149 at discharge  Diabetes mellitus without complication (HCC):noninsulin dependent  -Most recentA1cof8.6 on 09/28/17 -metforminheld in the hospital, resumed at discharge   Essential hypertension: -Continue PRN hydralazine -he is only on lasix at home, lasix held since admission due to sepsis -resume lasix   OSA (obstructive sleep apnea): Body mass index is 56.71 kg/m. meet morbid obesity criteria -continued on cpap as tolerated    Diarrhea: -No furtherdiarrhea since 8:00PMon 7/22.      DVT prophylaxis:Lovenox subQ Code Status:Full Family Communication:Pt in room, family not at bedside Disposition Plan:home on 7/26   Consultants:  none  Procedures:  none  Antibiotics:  As above   Discharge Exam: BP 133/65 (BP Location: Right Arm)   Pulse (!) 56   Temp 98.7 F (37.1 C) (Oral)   Resp 18   Ht 5\' 9"  (1.753 m)   Wt (!) 174.2 kg (384 lb)   SpO2 96%   BMI 56.71 kg/m    General:  NAD, obese   Cardiovascular: RRR  Respiratory: CTABL  Abdomen: Soft/ND/NT, positive BS, abdominal wall cellulitis less erythematous, no drainage  Musculoskeletal: No Edema  Neuro: alert, oriented        Discharge Instructions You were cared for by a hospitalist during your hospital stay. If you have any questions about your discharge medications or the care you received while you were in the hospital after you are discharged, you can call the unit and asked to speak with the hospitalist on call if the hospitalist that took care of you is not available. Once you are discharged, your primary care physician will handle any further medical issues. Please note that NO REFILLS for any  discharge medications will be authorized once you are discharged, as it is imperative that you return to your primary care physician (or establish a relationship with a primary care physician if you do not have one) for your aftercare needs so that they can reassess your need for medications and monitor your lab values.  Discharge Instructions    Diet - low sodium heart healthy   Complete by:  As directed    Carb modified diet   Increase activity slowly   Complete by:  As directed      Allergies as of 11/23/2017      Reactions   Adhesive [tape] Other (See Comments)   Blisters and rash at site      Medication List    TAKE these medications   amoxicillin-clavulanate 875-125 MG tablet Commonly known as:  AUGMENTIN Take 1 tablet by mouth 2 (two) times daily for 5 days.   aspirin EC 81 MG tablet Take 81 mg by mouth daily.   benzonatate 100 MG capsule Commonly known as:  TESSALON Take 1-2 capsules (100-200 mg total) by mouth 3 (three) times daily as needed. What changed:  reasons to take this   diclofenac sodium 1 % Gel Commonly known as:  VOLTAREN Apply 2 g topically 4 (four) times daily as needed (pain).   furosemide 80 MG  tablet Commonly known as:  LASIX Take 1 tablet (80 mg total) by mouth 2 (two) times daily. What changed:  when to take this   HYDROcodone-homatropine 5-1.5 MG/5ML syrup Commonly known as:  HYCODAN Take 5 mLs by mouth at bedtime as needed. What changed:  reasons to take this   ibuprofen 200 MG tablet Commonly known as:  ADVIL,MOTRIN Take 600 mg by mouth every 6 (six) hours as needed for mild pain.   IRON SUPPLEMENT 325 (65 FE) MG tablet Generic drug:  ferrous sulfate Take 325 mg by mouth daily with breakfast.   metFORMIN 500 MG tablet Commonly known as:  GLUCOPHAGE Take 1 tablet (500 mg total) by mouth 2 (two) times daily with a meal. Start once per day for 1st week.   multivitamin with minerals tablet Take 1 tablet by mouth daily.   potassium  chloride SA 20 MEQ tablet Commonly known as:  K-DUR,KLOR-CON Take 1 tablet (20 mEq total) by mouth 2 (two) times daily.   saccharomyces boulardii 250 MG capsule Commonly known as:  FLORASTOR Take 1 capsule (250 mg total) by mouth 2 (two) times daily for 10 days.      Allergies  Allergen Reactions  . Adhesive [Tape] Other (See Comments)    Blisters and rash at site   Follow-up Information    [name], MD Follow up in 1 week(s).   Specialties:  Family Medicine, Sports Medicine Why:  hospital discharge follow up. repeat cbc/bmp at follow up. Contact information: [place] [address] [phone]        Dustin Folks, MD Follow up.   Specialty:  Hematology and Oncology Contact information: [address] [place] [address] [phone]            The results of significant diagnostics from this hospitalization (including imaging, microbiology, ancillary and laboratory) are listed below for reference.    Significant Diagnostic Studies: Dg Chest 2 View  Result Date: 11/19/2017 CLINICAL DATA:  Shortness of breath EXAM: CHEST - 2 VIEW COMPARISON:  09/28/2017 FINDINGS: The heart size and mediastinal contours are within normal limits. Both lungs are clear. The visualized skeletal structures are unremarkable. IMPRESSION: No active cardiopulmonary disease. Electronically Signed   By: Ulyses Jarred M.D.   On: 11/19/2017 22:00    Microbiology: Recent Results (from the past 240 hour(s))  Blood culture (routine x 2)     Status: None (Preliminary result)   Collection Time: 11/19/17  9:15 PM  Result Value Ref Range Status   Specimen Description BLOOD RIGHT ARM  Final   Special Requests   Final    BOTTLES DRAWN AEROBIC AND ANAEROBIC Blood Culture results may not be optimal due to an excessive volume of blood received in culture bottles   Culture   Final    NO GROWTH 4 DAYS Performed at [facility], [place] [address], [place],  [place] [address]    Report Status PENDING  Incomplete  Blood culture (routine x 2)     Status: None (Preliminary result)   Collection Time: 11/19/17  9:30 PM  Result Value Ref Range Status   Specimen Description BLOOD LEFT ARM  Final   Special Requests   Final    BOTTLES DRAWN AEROBIC ONLY Blood Culture results may not be optimal due to an excessive volume of blood received in culture bottles   Culture   Final    NO GROWTH 4 DAYS Performed at [facility], [place] [address],  [place], [place] [address]    Report Status PENDING  Incomplete  MRSA PCR Screening     Status: None   Collection Time: 11/21/17  2:14 PM  Result Value Ref Range Status   MRSA by PCR NEGATIVE NEGATIVE Final    Comment:        The GeneXpert MRSA Assay (FDA approved for NASAL specimens only), is one component of a comprehensive MRSA colonization surveillance program. It is not intended to diagnose MRSA infection nor to guide or monitor treatment for MRSA infections. Performed at [facility], [place] [address], [place], [place] [address]      Labs: Basic Metabolic Panel: Recent Labs  Lab 11/19/17 2126 11/21/17 0525 11/22/17 0613 11/23/17 0434  NA 139 140 139 141  K 5.1 4.3 4.2 4.7  CL 103 108 107 107  CO2 24 25 24 25   GLUCOSE 158* 185* 153* 142*  BUN 18 8 8 10   CREATININE 1.13 0.61 0.68 0.66  CALCIUM 9.0 8.0* 8.3* 8.1*  MG  --   --   --  2.0   Liver Function Tests: Recent Labs  Lab 11/19/17 2126  AST 30  ALT 24  ALKPHOS 122  BILITOT 0.5  PROT 7.1  ALBUMIN 3.9   No results for input(s): LIPASE, AMYLASE in the last 168 hours. No results for input(s): AMMONIA in the last 168 hours. CBC: Recent Labs  Lab 11/19/17 2126 11/21/17 0525 11/22/17 0613 11/23/17 0434  WBC 70.7* 45.0* 52.0* 44.4*  NEUTROABS 13.4*  --   --   --   HGB 13.5 11.0* 12.0* 11.2*  HCT 44.4 36.6* 39.6 36.1*  MCV 89.5 89.7 89.4 89.1  PLT 191 127* 156 149*   Cardiac Enzymes: No results for input(s): CKTOTAL,  CKMB, CKMBINDEX, TROPONINI in the last 168 hours. BNP: BNP (last 3 results) Recent Labs    11/20/17 0129  BNP 13.0    ProBNP (last 3 results) No results for input(s): PROBNP in the last 8760 hours.  CBG: Recent Labs  Lab 11/22/17 0738 11/22/17 1337 11/22/17 1712 11/22/17 2057 11/23/17 0736  GLUCAP 149* 157* 182* 136* 145*       Signed:  Florencia Reasons MD, PhD  [facility] 11/23/2017, 10:45 AM

## 2017-11-24 ENCOUNTER — Other Ambulatory Visit: Payer: Self-pay | Admitting: Family Medicine

## 2017-11-24 DIAGNOSIS — R609 Edema, unspecified: Secondary | ICD-10-CM

## 2017-11-24 LAB — CULTURE, BLOOD (ROUTINE X 2)
Culture: NO GROWTH
Culture: NO GROWTH

## 2017-11-26 NOTE — Telephone Encounter (Signed)
Klor-Con 20 mEq tablet refill request  LOV 09/28/17 with Dr. Carlota Raspberry  [facility] [address] [place]

## 2017-12-10 ENCOUNTER — Encounter: Payer: Self-pay | Admitting: Family Medicine

## 2017-12-10 ENCOUNTER — Ambulatory Visit (INDEPENDENT_AMBULATORY_CARE_PROVIDER_SITE_OTHER): Payer: BC Managed Care – PPO | Admitting: Family Medicine

## 2017-12-10 VITALS — BP 149/80 | HR 80 | Temp 98.0°F | Ht 69.0 in | Wt 398.4 lb

## 2017-12-10 DIAGNOSIS — C9190 Lymphoid leukemia, unspecified not having achieved remission: Secondary | ICD-10-CM

## 2017-12-10 DIAGNOSIS — L03311 Cellulitis of abdominal wall: Secondary | ICD-10-CM | POA: Diagnosis not present

## 2017-12-10 DIAGNOSIS — I809 Phlebitis and thrombophlebitis of unspecified site: Secondary | ICD-10-CM

## 2017-12-10 DIAGNOSIS — C9110 Chronic lymphocytic leukemia of B-cell type not having achieved remission: Secondary | ICD-10-CM

## 2017-12-10 LAB — POCT CBC
GRANULOCYTE PERCENT: 11 % — AB (ref 37–80)
HCT, POC: 38.4 % — AB (ref 43.5–53.7)
HEMOGLOBIN: 11.5 g/dL — AB (ref 14.1–18.1)
LYMPH, POC: 48.1 — AB (ref 0.6–3.4)
MCH, POC: 25 pg — AB (ref 27–31.2)
MCHC: 29.9 g/dL — AB (ref 31.8–35.4)
MCV: 83.7 fL (ref 80–97)
MID (cbc): 3.8 — AB (ref 0–0.9)
MPV: 7.4 fL (ref 0–99.8)
PLATELET COUNT, POC: 260 10*3/uL (ref 142–424)
POC GRANULOCYTE: 6.4 (ref 2–6.9)
POC LYMPH PERCENT: 82.5 %L — AB (ref 10–50)
POC MID %: 6.5 %M (ref 0–12)
RBC: 4.58 M/uL — AB (ref 4.69–6.13)
RDW, POC: 16.6 %
WBC: 58.3 10*3/uL — AB (ref 4.6–10.2)

## 2017-12-10 MED ORDER — CEPHALEXIN 500 MG PO CAPS: 500.0000 mg | ORAL_CAPSULE | Freq: Four times a day (QID) | ORAL | 0 refills | Status: DC

## 2017-12-10 MED ORDER — CEFTRIAXONE SODIUM 1 G IJ SOLR
1.0000 g | Freq: Once | INTRAMUSCULAR | Status: AC
  Administered 2017-12-10: 1 g via INTRAMUSCULAR

## 2017-12-10 NOTE — Progress Notes (Signed)
Subjective:  By signing my name below, I, Henry Hinton, attest that this documentation has been prepared under the direction and in the presence of Henry Agreste, MD Electronically Signed: Ladene Artist, ED Scribe 12/10/2017 at 3:46 PM.   Patient ID: Henry Hinton, male    DOB: [birth year], 57 y.o.   MRN: [id number]  Chief Complaint  Patient presents with  . Hospitalization Follow-up    sepsis from cellulitis  . pain in left arm from iv site   HPI Henry Hinton "Henry Hinton" is a 57 y.o. male who presents to Primary Care at [facility] for hospital f/u. H/o CLL, obesity, HTN, DM. Admitted 7/22-26 with abdominal wall cellulitis and sepsis. Recurrent abdominal wall cellulitis as admitted in the past. On presentation, he had a WBC of 70.7 (h/o elevation with CLL, baseline in 22s). Fever of 101.9 and tachycardia, elevated lactate. No discharge or apparent abscess. Treated with vancomycin, rocephin, flagyl. MRSA screen neg. Blood cultures neg. Fever resolved and WBC to baseline. Vanc and rocephin discontinued, changed to ancef and flagyl, discharged home on Augmentin. Thrombocytopenia with improvement to 149 at discharge. Plan for rpt CBC and BMP today.  Pt states that he had a lot of diarrhea when he initially started antibiotics but none since stopping meds. Denies fever, redness or recent rash on abdominal wall. He did have a WBC in the 60s in June at hematologist.  DM Metformin was help in hospital, resumed at discharge. Lab Results  Component Value Date   HGBA1C 8.6 (H) 09/28/2017   HTN Prn hydralazine continued for BP. Lasix was held initially then resumed on discharge.  L Arm Pain Pt reports L arm pain since discharge that he describes as gradually improving soreness. He noticed firmness 3 days ago, redness into his axilla x 2 days ago which improved yesterday afternoon and warmth where the IV was inserted. He has been taking 800 mg bid x 2 days. Denies fever, chills.  Patient Active  Problem List   Diagnosis Date Noted  . Diarrhea 11/20/2017  . Diabetes mellitus without complication ([place]) 40/98/1191  . Iron deficiency anemia 11/19/2017  . Influenza, pneumonia 09/22/2017  . Abdominal wall cellulitis 01/22/2017  . Insect bite 01/22/2017  . Hyperkalemia 01/22/2017  . OSA (obstructive sleep apnea) 01/22/2017  . Essential hypertension   . Hoarseness 06/08/2016  . Cellulitis 11/28/2015  . Cellulitis of trunk 11/27/2015  . Nonspecific abnormal finding in stool contents   . Colon cancer screening   . Venous stasis   . History of DVT (deep vein thrombosis)   . Sepsis ([place]) 10/09/2015  . Panniculitis 10/09/2015  . CLL (chronic lymphocytic leukemia) ([place]) 05/27/2015  . 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 Leukocytosis (leucocytosis) 08/29/2014  . HNP (herniated nucleus pulposus), cervical 01/21/2013  . bilat hip replacement 04/08/2012  . Morbid obesity ([place]) 04/08/2012  . Edema 04/08/2012   Past Medical History:  Diagnosis Date  . Anemia   . Arthritis   . BMI 50.0-59.9, adult ([place])   . Cellulitis 11/2015   trunk  . cll 11/2014   CLL - 11/30/2014- Dr. [facility] [place], [place]  . DVT (deep venous thrombosis) ([place]) 6/16   Rt calf  took xarelto for 6 months  . Family history of anesthesia complication    " father having delirium after anesthesia"  . Heart murmur    Hx; of as a child  . History of kidney stones    x2  . Joint pain    Hx: of  periodically  . Numbness and tingling    Hx: of in right arm  . Pneumonia   . Sleep apnea    Cpap use- settings 10   Past Surgical History:  Procedure Laterality Date  . BACK SURGERY    . COLONOSCOPY     Hx: of  . COLONOSCOPY N/A 11/23/2015   Procedure: COLONOSCOPY;  Surgeon: Irene Shipper, MD;  Location: [facility];  Service: Endoscopy;  Laterality: N/A;  . INGUINAL HERNIA REPAIR Left 06/01/2015   Procedure: LEFT INGUINAL HERNIA REPAIR WITH MESH;  Surgeon: Autumn Messing III, MD;  Location: [facility];  Service:  General;  Laterality: Left;  . INGUINAL HERNIA REPAIR    . INSERTION OF MESH Left 06/01/2015   Procedure: INSERTION OF MESH;  Surgeon: Autumn Messing III, MD;  Location: [facility];  Service: General;  Laterality: Left;  . JOINT REPLACEMENT Bilateral    BTHA  . KNEE ARTHROSCOPY Left 07/21/2014   Dr. Noemi Chapel  . LUMBAR LAMINECTOMY  2005  . POSTERIOR CERVICAL LAMINECTOMY WITH MET- RX Right 01/21/2013   Procedure: Right Sitting C6-7 Microdiskectomy with Met-rex;  Surgeon: Kristeen Miss, MD;  Location: [facility];  Service: Neurosurgery;  Laterality: Right;  Right Sitting C6-7 Microdiskectomy with Met-rex  . STENT PLACEMENT RT URETER ([place] [facility])  10/23/2014  . tenosynovitis  2000   Hx: of thumb  . TONSILLECTOMY    . TOTAL HIP ARTHROPLASTY Bilateral   . WISDOM TOOTH EXTRACTION  1974   all 4   Allergies  Allergen Reactions  . Adhesive [Tape] Other (See Comments)    Blisters and rash at site   Prior to Admission medications   Medication Sig Start Date End Date Taking? Authorizing Provider  aspirin EC 81 MG tablet Take 81 mg by mouth daily.    [provider]  benzonatate (TESSALON) 100 MG capsule Take 1-2 capsules (100-200 mg total) by mouth 3 (three) times daily as needed. Patient taking differently: Take 100-200 mg by mouth 3 (three) times daily as needed for cough.  09/20/17   Jaynee Eagles, PA-C  diclofenac sodium (VOLTAREN) 1 % GEL Apply 2 g topically 4 (four) times daily as needed (pain).    [provider]  ferrous sulfate (IRON SUPPLEMENT) 325 (65 FE) MG tablet Take 325 mg by mouth daily with breakfast.    [provider]  furosemide (LASIX) 80 MG tablet Take 1 tablet (80 mg total) by mouth 2 (two) times daily. Patient taking differently: Take 80 mg by mouth daily.  06/01/17   Henry Agreste, MD  HYDROcodone-homatropine [facility]) 5-1.5 MG/5ML syrup Take 5 mLs by mouth at bedtime as needed. Patient taking differently: Take 5 mLs by mouth at bedtime as needed for cough.   09/20/17   Jaynee Eagles, PA-C  ibuprofen (ADVIL,MOTRIN) 200 MG tablet Take 600 mg by mouth every 6 (six) hours as needed for mild pain.    [provider]  KLOR-CON M20 20 MEQ tablet TAKE 1 TABLET BY MOUTH TWICE A DAY 11/27/17   Henry Agreste, MD  metFORMIN (GLUCOPHAGE) 500 MG tablet Take 1 tablet (500 mg total) by mouth 2 (two) times daily with a meal. Start once per day for 1st week. 09/29/17   Henry Agreste, MD  Multiple Vitamins-Minerals (MULTIVITAMIN WITH MINERALS) tablet Take 1 tablet by mouth daily.    [provider]   Social History   Socioeconomic History  . Marital status: Divorced    Spouse name: Not on file  .  Number of children: 0  . Years of education: Not on file  . Highest education level: Not on file  Occupational History  . Occupation: Geophysical data processor: [employer]  . Financial resource strain: Not on file  . Food insecurity:    Worry: Not on file    Inability: Not on file  . Transportation needs:    Medical: Not on file    Non-medical: Not on file  Tobacco Use  . Smoking status: Never Smoker  . Smokeless tobacco: Former Systems developer    Types: Chew  Substance and Sexual Activity  . Alcohol use: Yes    Alcohol/week: 1.0 standard drinks    Types: 1 Standard drinks or equivalent per week    Comment: BEER AND LIQUOR  . Drug use: No  . Sexual activity: Not Currently  Lifestyle  . Physical activity:    Days per week: Not on file    Minutes per session: Not on file  . Stress: Not on file  Relationships  . Social connections:    Talks on phone: Not on file    Gets together: Not on file    Attends religious service: Not on file    Active member of club or organization: Not on file    Attends meetings of clubs or organizations: Not on file    Relationship status: Not on file  . Intimate partner violence:    Fear of current or ex partner: Not on file    Emotionally abused: Not on file    Physically  abused: Not on file    Forced sexual activity: Not on file  Other Topics Concern  . Not on file  Social History Narrative   Exercise swimming 3-5 times/week for 45 minutes   Review of Systems  Constitutional: Negative for chills and fever.  Skin: Positive for color change. Negative for rash.      Objective:   Physical Exam  Constitutional: He is oriented to person, place, and time. He appears well-developed and well-nourished. No distress.  HENT:  Head: Normocephalic and atraumatic.  Cardiovascular: Normal rate.  Pulmonary/Chest: Effort normal.  Abdominal:  Faint pink appearance. No induration.  Lymphadenopathy:    He has no axillary adenopathy.  Neurological: He is alert and oriented to person, place, and time.  Skin:  L arm volar surface: faint erythema extending from mid to distal forearm to the upper arm ~10 cm proximal to the elbow. Axilla is nontender. In the erythematous area there is a  firm linear area ~ 7 cm long.  Psychiatric: He has a normal mood and affect.   Vitals:   12/10/17 1625  BP: (!) 149/80  Pulse: 80  Temp: 98 F (36.7 C)  TempSrc: Oral  SpO2: 95%  Weight: (!) 398 lb 6.4 oz (180.7 kg)  Height: '5\' 9"'  (1.753 m)      Assessment & Plan:    Hallie Ishida is a 57 y.o. male Thrombophlebitis - Plan: POCT CBC, Basic metabolic panel, cefTRIAXone (ROCEPHIN) injection 1 g, cephALEXin (KEFLEX) 500 MG capsule  Cellulitis, abdominal wall - Plan: Basic metabolic panel  CLL (chronic lymphocytic leukemia) (HCC) - Plan: POCT CBC, Basic metabolic panel  Abdominal wall cellulitis improved.  Completed antibiotic.  Concern on left arm suspicious for thrombophlebitis, and with slight erythema concern for possible suppurative thrombophlebitis.  Does report some improvement in discomfort, but given CLL and history of rapidly progressive cellulitis will treat with Rocephin 1 g initially,  start Keflex 500 mg 4 times daily, and recheck in 48 hours-72 hours. Continue  symptomatic care for now as well with ibuprofen and heat. Consider ultrasound if not improving, but less likely DVT with superficial symptoms. RTC/ER precautions if worse.   Meds ordered this encounter  Medications  . cefTRIAXone (ROCEPHIN) injection 1 g  . cephALEXin (KEFLEX) 500 MG capsule    Sig: Take 1 capsule (500 mg total) by mouth 4 (four) times daily.    Dispense:  28 capsule    Refill:  0   Patient Instructions   If any return of redness on abdominal wall, proceed to the ER. Continue follow up with hematologist as planned. Arm swelling is likely superficial thrombophlebitis.  I am concerned about some infection as well, but as some improvement in redness today can try initial treatment outpatient.  Rocephin 1 g given today, start Keflex 4 times per day and recheck in 3 days.  Continue ibuprofen, apply heat to area.  See other information below.  Return to the clinic or go to the nearest emergency room if any of your symptoms worsen or new symptoms occur.    Phlebitis Phlebitis is soreness and swelling (inflammation) of a vein. This can occur in your arms, legs, or torso (trunk), as well as deeper inside your body. Phlebitis is usually not serious when it occurs close to the surface of the body. However, it can cause serious problems when it occurs in a vein deeper inside the body. What are the causes? Phlebitis can be triggered by various things, including:  Reduced blood flow through your veins. This can happen with: ? Bed rest over a long period. ? Long-distance travel. ? Injury. ? Surgery. ? Being overweight (obese) or pregnant.  Having an IV tube put in the vein and getting certain medicines through the vein.  Cancer and cancer treatment.  Use of illegal drugs taken through the vein.  Inflammatory diseases.  Inherited (genetic) diseases that increase the risk of blood clots.  Hormone therapy, such as birth control pills.  What are the signs or symptoms?  Red,  tender, swollen, and painful area on your skin. Usually, the area will be long and narrow.  Firmness along the center of the affected area. This can indicate that a blood clot has formed.  Low-grade fever. How is this diagnosed? A health care provider can usually diagnose phlebitis by examining the affected area and asking about your symptoms. To check for infection or blood clots, your health care provider may order blood tests or an ultrasound exam of the area. Blood tests and your family history may also indicate if you have an underlying genetic disease that causes blood clots. Occasionally, a piece of tissue is taken from the body (biopsy sample) if an unusual cause of phlebitis is suspected. How is this treated? Treatment will vary depending on the severity of the condition and the area of the body affected. Treatment may include:  Use of a warm compress or heating pad.  Use of compression stockings or bandages.  Anti-inflammatory medicines.  Removal of any IV tube that may be causing the problem.  Medicines that kill germs (antibiotics) if an infection is present.  Blood-thinning medicines if a blood clot is suspected or present.  In rare cases, surgery may be needed to remove damaged sections of vein.  Follow these instructions at home:  Only take over-the-counter or prescription medicines as directed by your health care provider. Take all medicines exactly as prescribed.  Raise (elevate) the affected area above the level of your heart as directed by your health care provider.  Apply a warm compress or heating pad to the affected area as directed by your health care provider. Do not sleep with the heating pad.  Use compression stockings or bandages as directed. These will speed healing and prevent the condition from coming back.  If you are on blood thinners: ? Get follow-up blood tests as directed by your health care provider. ? Check with your health care provider before  using any new medicines. ? Carry a medical alert card or wear your medical alert jewelry to show that you are on blood thinners.  For phlebitis in the legs: ? Avoid prolonged standing or bed rest. ? Keep your legs moving. Raise your legs when sitting or lying.  Do not smoke.  Women, particularly those over the age of 5, should consider the risks and benefits of taking the contraceptive pill. This kind of hormone treatment can increase your risk for blood clots.  Follow up with your health care provider as directed. Contact a health care provider if:  You have unusual bruising or any bleeding problems.  Your swelling or pain in the affected area is not improving.  You are on anti-inflammatory medicine, and you develop belly (abdominal) pain. Get help right away if:  You have a sudden onset of chest pain or difficulty breathing.  You have a fever or persistent symptoms for more than 2-3 days.  You have a fever and your symptoms suddenly get worse. This information is not intended to replace advice given to you by your health care provider. Make sure you discuss any questions you have with your health care provider. Document Released: 04/11/2001 Document Revised: 09/23/2015 Document Reviewed: 12/23/2012 Elsevier Interactive Patient Education  2017 [employer].    IF you received an x-ray today, you will receive an invoice from [facility]. Please contact [facility] at [phone] with questions or concerns regarding your invoice.   IF you received labwork today, you will receive an invoice from [facility]. Please contact [facility] at [phone] with questions or concerns regarding your invoice.   Our billing staff will not be able to assist you with questions regarding bills from these companies.  You will be contacted with the lab results as soon as they are available. The fastest way to get your results is to activate your My Chart account. Instructions are  located on the last page of this paperwork. If you have not heard from [place] regarding the results in 2 weeks, please contact this office.      I personally performed the services described in this documentation, which was scribed in my presence. The recorded information has been reviewed and considered for accuracy and completeness, addended by me as needed, and agree with information above.  Signed,   [name], MD [facility] at [facility].  12/12/17 5:12 PM

## 2017-12-10 NOTE — Patient Instructions (Addendum)
If any return of redness on abdominal wall, proceed to the ER. Continue follow up with hematologist as planned. Arm swelling is likely superficial thrombophlebitis.  I am concerned about some infection as well, but as some improvement in redness today can try initial treatment outpatient.  Rocephin 1 g given today, start Keflex 4 times per day and recheck in 3 days.  Continue ibuprofen, apply heat to area.  See other information below.  Return to the clinic or go to the nearest emergency room if any of your symptoms worsen or new symptoms occur.    Phlebitis Phlebitis is soreness and swelling (inflammation) of a vein. This can occur in your arms, legs, or torso (trunk), as well as deeper inside your body. Phlebitis is usually not serious when it occurs close to the surface of the body. However, it can cause serious problems when it occurs in a vein deeper inside the body. What are the causes? Phlebitis can be triggered by various things, including:  Reduced blood flow through your veins. This can happen with: ? Bed rest over a long period. ? Long-distance travel. ? Injury. ? Surgery. ? Being overweight (obese) or pregnant.  Having an IV tube put in the vein and getting certain medicines through the vein.  Cancer and cancer treatment.  Use of illegal drugs taken through the vein.  Inflammatory diseases.  Inherited (genetic) diseases that increase the risk of blood clots.  Hormone therapy, such as birth control pills.  What are the signs or symptoms?  Red, tender, swollen, and painful area on your skin. Usually, the area will be long and narrow.  Firmness along the center of the affected area. This can indicate that a blood clot has formed.  Low-grade fever. How is this diagnosed? A health care provider can usually diagnose phlebitis by examining the affected area and asking about your symptoms. To check for infection or blood clots, your health care provider may order blood  tests or an ultrasound exam of the area. Blood tests and your family history may also indicate if you have an underlying genetic disease that causes blood clots. Occasionally, a piece of tissue is taken from the body (biopsy sample) if an unusual cause of phlebitis is suspected. How is this treated? Treatment will vary depending on the severity of the condition and the area of the body affected. Treatment may include:  Use of a warm compress or heating pad.  Use of compression stockings or bandages.  Anti-inflammatory medicines.  Removal of any IV tube that may be causing the problem.  Medicines that kill germs (antibiotics) if an infection is present.  Blood-thinning medicines if a blood clot is suspected or present.  In rare cases, surgery may be needed to remove damaged sections of vein.  Follow these instructions at home:  Only take over-the-counter or prescription medicines as directed by your health care provider. Take all medicines exactly as prescribed.  Raise (elevate) the affected area above the level of your heart as directed by your health care provider.  Apply a warm compress or heating pad to the affected area as directed by your health care provider. Do not sleep with the heating pad.  Use compression stockings or bandages as directed. These will speed healing and prevent the condition from coming back.  If you are on blood thinners: ? Get follow-up blood tests as directed by your health care provider. ? Check with your health care provider before using any new medicines. ? Carry a medical  alert card or wear your medical alert jewelry to show that you are on blood thinners.  For phlebitis in the legs: ? Avoid prolonged standing or bed rest. ? Keep your legs moving. Raise your legs when sitting or lying.  Do not smoke.  Women, particularly those over the age of 62, should consider the risks and benefits of taking the contraceptive pill. This kind of hormone  treatment can increase your risk for blood clots.  Follow up with your health care provider as directed. Contact a health care provider if:  You have unusual bruising or any bleeding problems.  Your swelling or pain in the affected area is not improving.  You are on anti-inflammatory medicine, and you develop belly (abdominal) pain. Get help right away if:  You have a sudden onset of chest pain or difficulty breathing.  You have a fever or persistent symptoms for more than 2-3 days.  You have a fever and your symptoms suddenly get worse. This information is not intended to replace advice given to you by your health care provider. Make sure you discuss any questions you have with your health care provider. Document Released: 04/11/2001 Document Revised: 09/23/2015 Document Reviewed: 12/23/2012 Elsevier Interactive Patient Education  2017 [employer].    IF you received an x-ray today, you will receive an invoice from [facility] Radiology. Please contact [facility] Radiology at [phone] with questions or concerns regarding your invoice.   IF you received labwork today, you will receive an invoice from [place]. Please contact [facility] at [phone] with questions or concerns regarding your invoice.   Our billing staff will not be able to assist you with questions regarding bills from these companies.  You will be contacted with the lab results as soon as they are available. The fastest way to get your results is to activate your My Chart account. Instructions are located on the last page of this paperwork. If you have not heard from [place] regarding the results in 2 weeks, please contact this office.

## 2017-12-11 LAB — BASIC METABOLIC PANEL
BUN / CREAT RATIO: 24 — AB (ref 9–20)
BUN: 16 mg/dL (ref 6–24)
CO2: 23 mmol/L (ref 20–29)
Calcium: 8.6 mg/dL — ABNORMAL LOW (ref 8.7–10.2)
Chloride: 106 mmol/L (ref 96–106)
Creatinine, Ser: 0.68 mg/dL — ABNORMAL LOW (ref 0.76–1.27)
GFR calc Af Amer: 123 mL/min/{1.73_m2} (ref >59)
GFR, EST NON AFRICAN AMERICAN: 106 mL/min/{1.73_m2} (ref >59)
GLUCOSE: 186 mg/dL — AB (ref 65–99)
POTASSIUM: 4 mmol/L (ref 3.5–5.2)
SODIUM: 141 mmol/L (ref 134–144)

## 2017-12-14 ENCOUNTER — Other Ambulatory Visit: Payer: Self-pay

## 2017-12-14 ENCOUNTER — Encounter: Payer: Self-pay | Admitting: Family Medicine

## 2017-12-14 ENCOUNTER — Ambulatory Visit: Payer: BC Managed Care – PPO | Admitting: Family Medicine

## 2017-12-14 VITALS — BP 114/77 | HR 68 | Temp 97.6°F | Ht 69.0 in | Wt 399.4 lb

## 2017-12-14 DIAGNOSIS — I809 Phlebitis and thrombophlebitis of unspecified site: Secondary | ICD-10-CM | POA: Diagnosis not present

## 2017-12-14 DIAGNOSIS — M79602 Pain in left arm: Secondary | ICD-10-CM

## 2017-12-14 NOTE — Patient Instructions (Addendum)
Continue ibuprofen as needed, antibiotic, and warm compresses to the affected area.  If it does not continue to improve in the next week to 10 days or any worsening including redness or new areas of involvement, please return for recheck.  Thanks for coming in today.  Return to the clinic or go to the nearest emergency room if any of your symptoms worsen or new symptoms occur.   Phlebitis Phlebitis is soreness and swelling (inflammation) of a vein. This can occur in your arms, legs, or torso (trunk), as well as deeper inside your body. Phlebitis is usually not serious when it occurs close to the surface of the body. However, it can cause serious problems when it occurs in a vein deeper inside the body. What are the causes? Phlebitis can be triggered by various things, including:  Reduced blood flow through your veins. This can happen with: ? Bed rest over a long period. ? Long-distance travel. ? Injury. ? Surgery. ? Being overweight (obese) or pregnant.  Having an IV tube put in the vein and getting certain medicines through the vein.  Cancer and cancer treatment.  Use of illegal drugs taken through the vein.  Inflammatory diseases.  Inherited (genetic) diseases that increase the risk of blood clots.  Hormone therapy, such as birth control pills.  What are the signs or symptoms?  Red, tender, swollen, and painful area on your skin. Usually, the area will be long and narrow.  Firmness along the center of the affected area. This can indicate that a blood clot has formed.  Low-grade fever. How is this diagnosed? A health care provider can usually diagnose phlebitis by examining the affected area and asking about your symptoms. To check for infection or blood clots, your health care provider may order blood tests or an ultrasound exam of the area. Blood tests and your family history may also indicate if you have an underlying genetic disease that causes blood clots. Occasionally,  a piece of tissue is taken from the body (biopsy sample) if an unusual cause of phlebitis is suspected. How is this treated? Treatment will vary depending on the severity of the condition and the area of the body affected. Treatment may include:  Use of a warm compress or heating pad.  Use of compression stockings or bandages.  Anti-inflammatory medicines.  Removal of any IV tube that may be causing the problem.  Medicines that kill germs (antibiotics) if an infection is present.  Blood-thinning medicines if a blood clot is suspected or present.  In rare cases, surgery may be needed to remove damaged sections of vein.  Follow these instructions at home:  Only take over-the-counter or prescription medicines as directed by your health care provider. Take all medicines exactly as prescribed.  Raise (elevate) the affected area above the level of your heart as directed by your health care provider.  Apply a warm compress or heating pad to the affected area as directed by your health care provider. Do not sleep with the heating pad.  Use compression stockings or bandages as directed. These will speed healing and prevent the condition from coming back.  If you are on blood thinners: ? Get follow-up blood tests as directed by your health care provider. ? Check with your health care provider before using any new medicines. ? Carry a medical alert card or wear your medical alert jewelry to show that you are on blood thinners.  For phlebitis in the legs: ? Avoid prolonged standing or bed  rest. ? Keep your legs moving. Raise your legs when sitting or lying.  Do not smoke.  Women, particularly those over the age of 65, should consider the risks and benefits of taking the contraceptive pill. This kind of hormone treatment can increase your risk for blood clots.  Follow up with your health care provider as directed. Contact a health care provider if:  You have unusual bruising or any  bleeding problems.  Your swelling or pain in the affected area is not improving.  You are on anti-inflammatory medicine, and you develop belly (abdominal) pain. Get help right away if:  You have a sudden onset of chest pain or difficulty breathing.  You have a fever or persistent symptoms for more than 2-3 days.  You have a fever and your symptoms suddenly get worse. This information is not intended to replace advice given to you by your health care provider. Make sure you discuss any questions you have with your health care provider. Document Released: 04/11/2001 Document Revised: 09/23/2015 Document Reviewed: 12/23/2012 Elsevier Interactive Patient Education  [employer].   If you have lab work done today you will be contacted with your lab results within the next 2 weeks.  If you have not heard from [place] then please contact us. The fastest way to get your results is to register for My Chart.   IF you received an x-ray today, you will receive an invoice from [facility]. Please contact [facility] at [phone] with questions or concerns regarding your invoice.   IF you received labwork today, you will receive an invoice from [place]. Please contact [facility] at [phone] with questions or concerns regarding your invoice.   Our billing staff will not be able to assist you with questions regarding bills from these companies.  You will be contacted with the lab results as soon as they are available. The fastest way to get your results is to activate your My Chart account. Instructions are located on the last page of this paperwork. If you have not heard from [place] regarding the results in 2 weeks, please contact this office.

## 2017-12-14 NOTE — Progress Notes (Signed)
Subjective:  By signing my name below, I, [name], attest that this documentation has been prepared under the direction and in the presence of [name], MD Electronically Signed: Ladene Artist, ED Scribe 12/14/2017 at 11:59 AM.   Patient ID: Henry Hinton, male    DOB: 07/22/1960, 57 y.o.   MRN: [id number]  Chief Complaint  Patient presents with  . left arm Phlebitis    3 day f/u    HPI  Henry Arzuaga "Ray" is a 57 y.o. male who presents to Primary Care at [facility] for f/u of suspected suppurative thrombophlebitis of L arm. Pt states soreness, swelling and redness have improved some. Notes redness began to improve yesterday morning. He has not applied heat to the area. Denies fever, chills, cp, sob, side-effects of Keflex.  Patient Active Problem List   Diagnosis Date Noted  . Diarrhea 11/20/2017  . Diabetes mellitus without complication ([place]) 30/12/2328  . Iron deficiency anemia 11/19/2017  . Influenza, pneumonia 09/22/2017  . Abdominal wall cellulitis 01/22/2017  . Insect bite 01/22/2017  . Hyperkalemia 01/22/2017  . OSA (obstructive sleep apnea) 01/22/2017  . Essential hypertension   . Hoarseness 06/08/2016  . Cellulitis 11/28/2015  . Cellulitis of trunk 11/27/2015  . Nonspecific abnormal finding in stool contents   . Colon cancer screening   . Venous stasis   . History of DVT (deep vein thrombosis)   . Sepsis ([place]) 10/09/2015  . Panniculitis 10/09/2015  . CLL (chronic lymphocytic leukemia) ([place]) 05/27/2015  . 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 Leukocytosis (leucocytosis) 08/29/2014  . HNP (herniated nucleus pulposus), cervical 01/21/2013  . bilat hip replacement 04/08/2012  . Morbid obesity ([place]) 04/08/2012  . Edema 04/08/2012   Past Medical History:  Diagnosis Date  . Anemia   . Arthritis   . BMI 50.0-59.9, adult ([place])   . Cellulitis 11/2015   trunk  . cll 11/2014   CLL - 11/30/2014- Dr. [facility] [place], [place]  . DVT (deep venous  thrombosis) ([place]) 6/16   Rt calf  took xarelto for 6 months  . Family history of anesthesia complication    " father having delirium after anesthesia"  . Heart murmur    Hx; of as a child  . History of kidney stones    x2  . Joint pain    Hx: of periodically  . Numbness and tingling    Hx: of in right arm  . Pneumonia   . Sleep apnea    Cpap use- settings 10   Past Surgical History:  Procedure Laterality Date  . BACK SURGERY    . COLONOSCOPY     Hx: of  . COLONOSCOPY N/A 11/23/2015   Procedure: COLONOSCOPY;  Surgeon: Irene Shipper, MD;  Location: [facility];  Service: Endoscopy;  Laterality: N/A;  . INGUINAL HERNIA REPAIR Left 06/01/2015   Procedure: LEFT INGUINAL HERNIA REPAIR WITH MESH;  Surgeon: Autumn Messing III, MD;  Location: [facility] ORS;  Service: General;  Laterality: Left;  . INGUINAL HERNIA REPAIR    . INSERTION OF MESH Left 06/01/2015   Procedure: INSERTION OF MESH;  Surgeon: Autumn Messing III, MD;  Location: [facility] ORS;  Service: General;  Laterality: Left;  . JOINT REPLACEMENT Bilateral    BTHA  . KNEE ARTHROSCOPY Left 07/21/2014   Dr. Noemi Chapel  . LUMBAR LAMINECTOMY  2005  . POSTERIOR CERVICAL LAMINECTOMY WITH MET- RX Right 01/21/2013   Procedure: Right Sitting C6-7 Microdiskectomy with Met-rex;  Surgeon: Kristeen Miss, MD;  Location: [facility] ORS;  Service: Neurosurgery;  Laterality: Right;  Right Sitting C6-7 Microdiskectomy with Met-rex  . STENT PLACEMENT RT URETER ([place] HX)  10/23/2014  . tenosynovitis  2000   Hx: of thumb  . TONSILLECTOMY    . TOTAL HIP ARTHROPLASTY Bilateral   . WISDOM TOOTH EXTRACTION  1974   all 4   Allergies  Allergen Reactions  . Adhesive [Tape] Other (See Comments)    Blisters and rash at site   Prior to Admission medications   Medication Sig Start Date End Date Taking? Authorizing Provider  aspirin EC 81 MG tablet Take 81 mg by mouth daily.   Yes [provider]  cephALEXin (KEFLEX) 500 MG capsule Take 1 capsule (500 mg total) by mouth 4  (four) times daily. 12/10/17  Yes [name], MD  diclofenac sodium (VOLTAREN) 1 % GEL Apply 2 g topically 4 (four) times daily as needed (pain).   Yes [provider]  ferrous sulfate (IRON SUPPLEMENT) 325 (65 FE) MG tablet Take 325 mg by mouth daily with breakfast.   Yes [provider]  furosemide (LASIX) 80 MG tablet Take 1 tablet (80 mg total) by mouth 2 (two) times daily. Patient taking differently: Take 80 mg by mouth daily.  06/01/17  Yes [name], MD  ibuprofen (ADVIL,MOTRIN) 200 MG tablet Take 600 mg by mouth every 6 (six) hours as needed for mild pain.   Yes [provider]  KLOR-CON M20 20 MEQ tablet TAKE 1 TABLET BY MOUTH TWICE A DAY 11/27/17  Yes [name], MD  metFORMIN (GLUCOPHAGE) 500 MG tablet Take 1 tablet (500 mg total) by mouth 2 (two) times daily with a meal. Start once per day for 1st week. 09/29/17  Yes [name], MD  Multiple Vitamins-Minerals (MULTIVITAMIN WITH MINERALS) tablet Take 1 tablet by mouth daily.   Yes [provider]   Social History   Socioeconomic History  . Marital status: Divorced    Spouse name: Not on file  . Number of children: 0  . Years of education: Not on file  . Highest education level: Not on file  Occupational History  . Occupation: Geophysical data processor: [employer]  . Financial resource strain: Not on file  . Food insecurity:    Worry: Not on file    Inability: Not on file  . Transportation needs:    Medical: Not on file    Non-medical: Not on file  Tobacco Use  . Smoking status: Never Smoker  . Smokeless tobacco: Former Systems developer    Types: Chew  Substance and Sexual Activity  . Alcohol use: Yes    Alcohol/week: 1.0 standard drinks    Types: 1 Standard drinks or equivalent per week    Comment: BEER AND LIQUOR  . Drug use: No  . Sexual activity: Not Currently  Lifestyle  . Physical activity:    Days per week: Not on file     Minutes per session: Not on file  . Stress: Not on file  Relationships  . Social connections:    Talks on phone: Not on file    Gets together: Not on file    Attends religious service: Not on file    Active member of club or organization: Not on file    Attends meetings of clubs or organizations: Not on file    Relationship status: Not on file  . Intimate partner violence:  Fear of current or ex partner: Not on file    Emotionally abused: Not on file    Physically abused: Not on file    Forced sexual activity: Not on file  Other Topics Concern  . Not on file  Social History Narrative   Exercise swimming 3-5 times/week for 45 minutes   Review of Systems  Constitutional: Negative for chills and fever.  Respiratory: Negative for shortness of breath.   Cardiovascular: Negative for chest pain.  Skin: Positive for color change (improved).      Objective:   Physical Exam  Constitutional: He is oriented to person, place, and time. He appears well-developed and well-nourished. No distress.  HENT:  Head: Normocephalic and atraumatic.  Eyes: Conjunctivae and EOM are normal.  Neck: Neck supple. No tracheal deviation present.  Cardiovascular: Normal rate.  Pulmonary/Chest: Effort normal. No respiratory distress.  Abdominal:  Abdominal wall redness has improved.  Musculoskeletal: Normal range of motion.  Neurological: He is alert and oriented to person, place, and time.  Skin: Skin is warm and dry.  L arm: Very faint firm area over volar upper forearm on L. No overlying erythema. Small firm area along ulnar aspect of L distal forearm to the wrist. Hand veins are flat including with arm elevation.  Psychiatric: He has a normal mood and affect. His behavior is normal.  Nursing note and vitals reviewed.  Vitals:   12/14/17 1134  BP: 114/77  Pulse: 68  Temp: 97.6 F (36.4 C)  TempSrc: Oral  SpO2: 95%  Weight: (!) 399 lb 6.4 oz (181.2 kg)  Height: 5' 9" (1.753 m)        Assessment & Plan:   Drako Maese is a 57 y.o. male Phlebitis  Left arm pain   Improved. Continue ibuprofen, complete antibiotic, and warm compresses. rtc precautions.   No orders of the defined types were placed in this encounter.  Patient Instructions     Continue ibuprofen as needed, antibiotic, and warm compresses to the affected area.  If it does not continue to improve in the next week to 10 days or any worsening including redness or new areas of involvement, please return for recheck.  Thanks for coming in today.  Return to the clinic or go to the nearest emergency room if any of your symptoms worsen or new symptoms occur.   Phlebitis Phlebitis is soreness and swelling (inflammation) of a vein. This can occur in your arms, legs, or torso (trunk), as well as deeper inside your body. Phlebitis is usually not serious when it occurs close to the surface of the body. However, it can cause serious problems when it occurs in a vein deeper inside the body. What are the causes? Phlebitis can be triggered by various things, including:  Reduced blood flow through your veins. This can happen with: ? Bed rest over a long period. ? Long-distance travel. ? Injury. ? Surgery. ? Being overweight (obese) or pregnant.  Having an IV tube put in the vein and getting certain medicines through the vein.  Cancer and cancer treatment.  Use of illegal drugs taken through the vein.  Inflammatory diseases.  Inherited (genetic) diseases that increase the risk of blood clots.  Hormone therapy, such as birth control pills.  What are the signs or symptoms?  Red, tender, swollen, and painful area on your skin. Usually, the area will be long and narrow.  Firmness along the center of the affected area. This can indicate that a blood clot has  formed.  Low-grade fever. How is this diagnosed? A health care provider can usually diagnose phlebitis by examining the affected area and asking  about your symptoms. To check for infection or blood clots, your health care provider may order blood tests or an ultrasound exam of the area. Blood tests and your family history may also indicate if you have an underlying genetic disease that causes blood clots. Occasionally, a piece of tissue is taken from the body (biopsy sample) if an unusual cause of phlebitis is suspected. How is this treated? Treatment will vary depending on the severity of the condition and the area of the body affected. Treatment may include:  Use of a warm compress or heating pad.  Use of compression stockings or bandages.  Anti-inflammatory medicines.  Removal of any IV tube that may be causing the problem.  Medicines that kill germs (antibiotics) if an infection is present.  Blood-thinning medicines if a blood clot is suspected or present.  In rare cases, surgery may be needed to remove damaged sections of vein.  Follow these instructions at home:  Only take over-the-counter or prescription medicines as directed by your health care provider. Take all medicines exactly as prescribed.  Raise (elevate) the affected area above the level of your heart as directed by your health care provider.  Apply a warm compress or heating pad to the affected area as directed by your health care provider. Do not sleep with the heating pad.  Use compression stockings or bandages as directed. These will speed healing and prevent the condition from coming back.  If you are on blood thinners: ? Get follow-up blood tests as directed by your health care provider. ? Check with your health care provider before using any new medicines. ? Carry a medical alert card or wear your medical alert jewelry to show that you are on blood thinners.  For phlebitis in the legs: ? Avoid prolonged standing or bed rest. ? Keep your legs moving. Raise your legs when sitting or lying.  Do not smoke.  Women, particularly those over the age of  17, should consider the risks and benefits of taking the contraceptive pill. This kind of hormone treatment can increase your risk for blood clots.  Follow up with your health care provider as directed. Contact a health care provider if:  You have unusual bruising or any bleeding problems.  Your swelling or pain in the affected area is not improving.  You are on anti-inflammatory medicine, and you develop belly (abdominal) pain. Get help right away if:  You have a sudden onset of chest pain or difficulty breathing.  You have a fever or persistent symptoms for more than 2-3 days.  You have a fever and your symptoms suddenly get worse. This information is not intended to replace advice given to you by your health care provider. Make sure you discuss any questions you have with your health care provider. Document Released: 04/11/2001 Document Revised: 09/23/2015 Document Reviewed: 12/23/2012 Elsevier Interactive Patient Education  [employer].   If you have lab work done today you will be contacted with your lab results within the next 2 weeks.  If you have not heard from [place] then please contact us. The fastest way to get your results is to register for My Chart.   IF you received an x-ray today, you will receive an invoice from [facility]. Please contact [facility] at [phone] with questions or concerns regarding your invoice.   IF you received  labwork today, you will receive an invoice from [facility]. Please contact [facility] at [phone] with questions or concerns regarding your invoice.   Our billing staff will not be able to assist you with questions regarding bills from these companies.  You will be contacted with the lab results as soon as they are available. The fastest way to get your results is to activate your My Chart account. Instructions are located on the last page of this paperwork. If you have not heard from [place] regarding the results in 2  weeks, please contact this office.       I personally performed the services described in this documentation, which was scribed in my presence. The recorded information has been reviewed and considered for accuracy and completeness, addended by me as needed, and agree with information above.  Signed,   [name], MD Primary [facility] at [facility].  12/21/17 7:59 AM

## 2017-12-21 ENCOUNTER — Encounter: Payer: Self-pay | Admitting: Family Medicine

## 2018-03-04 ENCOUNTER — Ambulatory Visit (INDEPENDENT_AMBULATORY_CARE_PROVIDER_SITE_OTHER): Payer: BC Managed Care – PPO | Admitting: Family Medicine

## 2018-03-04 ENCOUNTER — Other Ambulatory Visit: Payer: Self-pay

## 2018-03-04 ENCOUNTER — Encounter: Payer: Self-pay | Admitting: Family Medicine

## 2018-03-04 VITALS — BP 116/78 | HR 67 | Temp 98.2°F | Ht 69.0 in | Wt 388.8 lb

## 2018-03-04 DIAGNOSIS — E119 Type 2 diabetes mellitus without complications: Secondary | ICD-10-CM

## 2018-03-04 DIAGNOSIS — Z23 Encounter for immunization: Secondary | ICD-10-CM | POA: Diagnosis not present

## 2018-03-04 DIAGNOSIS — Z13220 Encounter for screening for lipoid disorders: Secondary | ICD-10-CM | POA: Diagnosis not present

## 2018-03-04 DIAGNOSIS — N402 Nodular prostate without lower urinary tract symptoms: Secondary | ICD-10-CM

## 2018-03-04 DIAGNOSIS — Z0000 Encounter for general adult medical examination without abnormal findings: Secondary | ICD-10-CM

## 2018-03-04 DIAGNOSIS — Z125 Encounter for screening for malignant neoplasm of prostate: Secondary | ICD-10-CM | POA: Diagnosis not present

## 2018-03-04 NOTE — Patient Instructions (Addendum)
   lasix up to twice per day. I will check potassium levels with blood work today. Depending on cramps, may need to adjust dose for swelling or follow up with nephrology.   Continue same dose of metformin until I see your labs.  Pneumonia and flu vaccine given today.  I will refer you for repeat prostate testing as possible nodule on R lobe.    If you have lab work done today you will be contacted with your lab results within the next 2 weeks.  If you have not heard from [place] then please contact us. The fastest way to get your results is to register for My Chart.   IF you received an x-ray today, you will receive an invoice from [facility]. Please contact [facility] at [phone] with questions or concerns regarding your invoice.   IF you received labwork today, you will receive an invoice from [place]. Please contact [facility] at [phone] with questions or concerns regarding your invoice.   Our billing staff will not be able to assist you with questions regarding bills from these companies.  You will be contacted with the lab results as soon as they are available. The fastest way to get your results is to activate your My Chart account. Instructions are located on the last page of this paperwork. If you have not heard from [place] regarding the results in 2 weeks, please contact this office.

## 2018-03-04 NOTE — Progress Notes (Addendum)
Subjective:  By signing my name below, I, Henry Hinton, attest that this documentation has been prepared under the direction and in the presence of [name], MD. Electronically Signed: Moises Hinton, [place]. 03/04/2018 , 3:54 PM .  Patient was seen in Room 14 .   Patient ID: Henry Hinton, male    DOB: Sep 18, 1960, 57 y.o.   MRN: [id number] Chief Complaint  Patient presents with  . Annual Exam    CPE   HPI Henry Hinton is a 57 y.o. male Here for annual physical. He has a history of obesity, chronic peripheral edema, CLL (chronic lymphocytic leukemia), history of DVT, phlebitis, HTN, OSA and diabetes.   He plans to retire in June; plans to work on his health afterwards. He's fasting today; last meal was 8:00 PM last night.   CLL He's followed by Dr. [name] with last office visit on 01/02/18 at [facility]. CBC on 01/02/18 with WBC 66.9, platelets normal 193, and hemoglobin 13.4. Plan for follow up on Hinton 4th with labs at that time.   Diabetes with obesity Lab Results  Component Value Date   HGBA1C 8.6 (H) 09/28/2017   Plan for 3 months follow up at that time; he takes metformin 500 mg bid. He denies side effects or diarrhea with the medication.   Wt Readings from Last 3 Encounters:  03/04/18 (!) 388 lb 12.8 oz (176.4 kg)  12/14/17 (!) 399 lb 6.4 oz (181.2 kg)  12/10/17 (!) 398 lb 6.4 oz (180.7 kg)   Urine micro albumin: 06/01/17, normal  Pneumovax: due, will update today  Foot: 02/27/17  Optho: 02/25/18  History of phlebitis, and previous admissions for abdominal wall cellulitis Most recently seen on Aug 16th with improving arm phlebitis after treatment of Keflex. Last admitted for abdominal wall cellulitis in July.   HTN - peripheral edema He's currently on Lasix 80 mg bid and potassium 20 mEq bid. He was seen in [place] 2018 by nephrology, Dr. Florene Glen.   He's doing Lasix 80 mg qd at the moment. He plans to resume Lasix bid as weather cools down and football  season slows down. He is still taking potassium 20 mEq bid.   Cancer Screening Colonoscopy: last done on 11/23/15.   Prostate cancer screening: family history of prostate cancer in father.  Lab Results  Component Value Date   PSA1 0.6 02/27/2017    Immunizations Immunization History  Administered Date(s) Administered  . Influenza,inj,Quad PF,6+ Mos 02/27/2017, 03/04/2018  . Tdap 07/31/2006, 02/27/2017   Pneumovax: updated today.  Flu shot: updated today.   Depression Depression screen [facility] 2/9 03/04/2018 12/14/2017 12/10/2017 09/28/2017 09/20/2017  Decreased Interest 0 0 0 0 0  Down, Depressed, Hopeless 0 0 0 0 0  PHQ - 2 Score 0 0 0 0 0    Vision  Visual Acuity Screening   Right eye Left eye Both eyes  Without correction: _0  With correction:      Last saw eye doctor a week ago.   Dentist He's followed by dentist regularly.   Activity/Exercise/obesity Wt Readings from Last 3 Encounters:  03/04/18 (!) 388 lb 12.8 oz (176.4 kg)  12/14/17 (!) 399 lb 6.4 oz (181.2 kg)  12/10/17 (!) 398 lb 6.4 oz (180.7 kg)   Body mass index is 57.42 kg/m.  He swims about 1.5 - 2 miles every early morning.   Patient Active Problem List   Diagnosis Date Noted  . Diarrhea 11/20/2017  . Diabetes mellitus without  complication ([place]) 81/82/9937  . Iron deficiency anemia 11/19/2017  . Influenza, pneumonia 09/22/2017  . Abdominal wall cellulitis 01/22/2017  . Insect bite 01/22/2017  . Hyperkalemia 01/22/2017  . OSA (obstructive sleep apnea) 01/22/2017  . Essential hypertension   . Hoarseness 06/08/2016  . Cellulitis 11/28/2015  . Cellulitis of trunk 11/27/2015  . Nonspecific abnormal finding in stool contents   . Colon cancer screening   . Venous stasis   . History of DVT (deep vein thrombosis)   . Sepsis ([place]) 10/09/2015  . Panniculitis 10/09/2015  . CLL (chronic lymphocytic leukemia) ([place]) 05/27/2015  . 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 Leukocytosis (leucocytosis)  08/29/2014  . HNP (herniated nucleus pulposus), cervical 01/21/2013  . bilat hip replacement 04/08/2012  . Morbid obesity ([place]) 04/08/2012  . Edema 04/08/2012   Past Medical History:  Diagnosis Date  . Anemia   . Arthritis   . BMI 50.0-59.9, adult ([place])   . Cellulitis 11/2015   trunk  . cll 11/2014   CLL - 11/30/2014- Dr. [facility] [place], [place]  . DVT (deep venous thrombosis) ([place]) 6/16   Rt calf  took xarelto for 6 months  . Family history of anesthesia complication    " father having delirium after anesthesia"  . Heart murmur    Hx; of as a child  . History of kidney stones    x2  . Joint pain    Hx: of periodically  . Numbness and tingling    Hx: of in right arm  . Pneumonia   . Sleep apnea    Cpap use- settings 10   Past Surgical History:  Procedure Laterality Date  . BACK SURGERY    . COLONOSCOPY     Hx: of  . COLONOSCOPY N/A 11/23/2015   Procedure: COLONOSCOPY;  Surgeon: Irene Shipper, MD;  Location: [facility];  Service: Endoscopy;  Laterality: N/A;  . INGUINAL HERNIA REPAIR Left 06/01/2015   Procedure: LEFT INGUINAL HERNIA REPAIR WITH MESH;  Surgeon: Autumn Messing III, MD;  Location: [facility];  Service: General;  Laterality: Left;  . INGUINAL HERNIA REPAIR    . INSERTION OF MESH Left 06/01/2015   Procedure: INSERTION OF MESH;  Surgeon: Autumn Messing III, MD;  Location: [facility];  Service: General;  Laterality: Left;  . JOINT REPLACEMENT Bilateral    BTHA  . KNEE ARTHROSCOPY Left 07/21/2014   Dr. Noemi Chapel  . LUMBAR LAMINECTOMY  2005  . POSTERIOR CERVICAL LAMINECTOMY WITH MET- RX Right 01/21/2013   Procedure: Right Sitting C6-7 Microdiskectomy with Met-rex;  Surgeon: Kristeen Miss, MD;  Location: [facility];  Service: Neurosurgery;  Laterality: Right;  Right Sitting C6-7 Microdiskectomy with Met-rex  . STENT PLACEMENT RT URETER ([place] [facility])  10/23/2014  . tenosynovitis  2000   Hx: of thumb  . TONSILLECTOMY    . TOTAL HIP ARTHROPLASTY Bilateral   . WISDOM TOOTH  EXTRACTION  1974   all 4   Allergies  Allergen Reactions  . Adhesive [Tape] Other (See Comments)    Blisters and rash at site   Prior to Admission medications   Medication Sig Start Date End Date Taking? Authorizing Provider  aspirin EC 81 MG tablet Take 81 mg by mouth daily.    [provider]  cephALEXin (KEFLEX) 500 MG capsule Take 1 capsule (500 mg total) by mouth 4 (four) times daily. 12/10/17   [name], MD  diclofenac sodium (VOLTAREN) 1 % GEL Apply 2 g topically 4 (four) times daily as  needed (pain).    [provider]  ferrous sulfate (IRON SUPPLEMENT) 325 (65 FE) MG tablet Take 325 mg by mouth daily with breakfast.    [provider]  furosemide (LASIX) 80 MG tablet Take 1 tablet (80 mg total) by mouth 2 (two) times daily. Patient taking differently: Take 80 mg by mouth daily.  06/01/17   [name], MD  ibuprofen (ADVIL,MOTRIN) 200 MG tablet Take 600 mg by mouth every 6 (six) hours as needed for mild pain.    [provider]  KLOR-CON M20 20 MEQ tablet TAKE 1 TABLET BY MOUTH TWICE A DAY 11/27/17   [name], MD  metFORMIN (GLUCOPHAGE) 500 MG tablet Take 1 tablet (500 mg total) by mouth 2 (two) times daily with a meal. Start once per day for 1st week. 09/29/17   [name], MD  Multiple Vitamins-Minerals (MULTIVITAMIN WITH MINERALS) tablet Take 1 tablet by mouth daily.    [provider]   Social History   Socioeconomic History  . Marital status: Divorced    Spouse name: Not on file  . Number of children: 0  . Years of education: Not on file  . Highest education level: Not on file  Occupational History  . Occupation: Geophysical data processor: [employer]  . Financial resource strain: Not on file  . Food insecurity:    Worry: Not on file    Inability: Not on file  . Transportation needs:    Medical: Not on file    Non-medical: Not on file  Tobacco Use  .  Smoking status: Never Smoker  . Smokeless tobacco: Former Systems developer    Types: Chew  Substance and Sexual Activity  . Alcohol use: Yes    Alcohol/week: 1.0 standard drinks    Types: 1 Standard drinks or equivalent per week    Comment: BEER AND LIQUOR  . Drug use: No  . Sexual activity: Not Currently  Lifestyle  . Physical activity:    Days per week: Not on file    Minutes per session: Not on file  . Stress: Not on file  Relationships  . Social connections:    Talks on phone: Not on file    Gets together: Not on file    Attends religious service: Not on file    Active member of club or organization: Not on file    Attends meetings of clubs or organizations: Not on file    Relationship status: Not on file  . Intimate partner violence:    Fear of current or ex partner: Not on file    Emotionally abused: Not on file    Physically abused: Not on file    Forced sexual activity: Not on file  Other Topics Concern  . Not on file  Social History Narrative   Exercise swimming 3-5 times/week for 45 minutes   Review of Systems 13 point ROS - positive for leg swelling, and joint pain     Objective:   Physical Exam  Constitutional: He is oriented to person, place, and time. He appears well-developed and well-nourished.  HENT:  Head: Normocephalic and atraumatic.  Right Ear: External ear normal.  Left Ear: External ear normal.  Mouth/Throat: Oropharynx is clear and moist.  Eyes: Pupils are equal, round, and reactive to light. Conjunctivae and EOM are normal.  Neck: Normal range of motion. Neck supple. No thyromegaly present.  Cardiovascular: Normal rate, regular rhythm, normal heart sounds and  intact distal pulses.  Pulmonary/Chest: Effort normal and breath sounds normal. No respiratory distress. He has no wheezes.  Abdominal: Soft. He exhibits no distension. There is no tenderness.  Genitourinary: Prostate is enlarged (Discrepancy noted with possible nodular area on the right lobe.   Nontender).  Musculoskeletal: Normal range of motion. He exhibits no edema or tenderness.  2+ tight edema to mid tibia, some stasis changes  Lymphadenopathy:    He has no cervical adenopathy.  Neurological: He is alert and oriented to person, place, and time. He has normal reflexes.  Skin: Skin is warm and dry.  Psychiatric: He has a normal mood and affect. His behavior is normal.  Vitals reviewed.   Vitals:   03/04/18 1523  BP: 116/78  Pulse: 67  Temp: 98.2 F (36.8 C)  TempSrc: Oral  SpO2: 97%  Weight: (!) 388 lb 12.8 oz (176.4 kg)  Height: _0  (1.753 m)       Assessment & Plan:  Henry Hinton is a 57 y.o. male Annual physical exam  - -anticipatory guidance as below in AVS, screening labs above. Health maintenance items as above in HPI discussed/recommended as applicable.   Need for influenza vaccination - Plan: Flu Vaccine QUAD 36+ mos IM  Type 2 diabetes mellitus without complication, without long-term current use of insulin ([place]) - Plan: Hemoglobin A1c  -Commended on some weight improvement.  Check A1c, may need additional dosing of metformin but currently tolerating  Screening for prostate cancer - Plan: PSA Prostate nodule - Plan: Ambulatory referral to Urology  -We discussed pros and cons of prostate cancer screening, and after this discussion, he chose to have screening done. PSA obtained.  Possible nodular area on the right.  Will refer to urology for further exam, PSA pending.  There is a paternal family history of prostate cancer.  Screening for hyperlipidemia - Plan: Comprehensive metabolic panel, Lipid panel  Need for prophylactic vaccination against Streptococcus pneumoniae (pneumococcus) - Plan: Pneumococcal polysaccharide vaccine 23-valent greater than or equal to 2yo subcutaneous/IM  History of peripheral edema, has Lasix with variable dosing as needed.  Check potassium levels.  If increasing cramps may need to adjust dose or follow-up with  nephrology  Continue follow-up with oncologist regarding chronic lymphocytic leukemia.  No orders of the defined types were placed in this encounter.  Patient Instructions     lasix up to twice per day. I will check potassium levels with [place] work today. Depending on cramps, may need to adjust dose for swelling or follow up with nephrology.   Continue same dose of metformin until I see your labs.  Pneumonia and flu vaccine given today.  I will refer you for repeat prostate testing as possible nodule on R lobe.    If you have lab work done today you will be contacted with your lab results within the next 2 weeks.  If you have not heard from [place] then please contact us. The fastest way to get your results is to register for My Chart.   IF you received an x-ray today, you will receive an invoice from [facility]. Please contact [facility] Radiology at [phone] with questions or concerns regarding your invoice.   IF you received labwork today, you will receive an invoice from [place]. Please contact [facility] at [phone] with questions or concerns regarding your invoice.   Our billing staff will not be able to assist you with questions regarding bills from these companies.  You will be contacted with the lab  results as soon as they are available. The fastest way to get your results is to activate your My Chart account. Instructions are located on the last page of this paperwork. If you have not heard from [place] regarding the results in 2 weeks, please contact this office.       I personally performed the services described in this documentation, which was scribed in my presence. The recorded information has been reviewed and considered for accuracy and completeness, addended by me as needed, and agree with information above.  Signed,   [name], MD [facility] [place].  03/07/18 2:55 PM

## 2018-03-05 LAB — COMPREHENSIVE METABOLIC PANEL
ALT: 26 IU/L (ref 0–44)
AST: 21 IU/L (ref 0–40)
Albumin/Globulin Ratio: 1.6 (ref 1.2–2.2)
Albumin: 4.5 g/dL (ref 3.5–5.5)
Alkaline Phosphatase: 132 IU/L — ABNORMAL HIGH (ref 39–117)
BUN/Creatinine Ratio: 22 — ABNORMAL HIGH (ref 9–20)
BUN: 16 mg/dL (ref 6–24)
Bilirubin Total: 0.5 mg/dL (ref 0.0–1.2)
CALCIUM: 9.1 mg/dL (ref 8.7–10.2)
CHLORIDE: 100 mmol/L (ref 96–106)
CO2: 21 mmol/L (ref 20–29)
Creatinine, Ser: 0.72 mg/dL — ABNORMAL LOW (ref 0.76–1.27)
GFR, EST AFRICAN AMERICAN: 120 mL/min/{1.73_m2} (ref >59)
GFR, EST NON AFRICAN AMERICAN: 104 mL/min/{1.73_m2} (ref >59)
GLUCOSE: 119 mg/dL — AB (ref 65–99)
Globulin, Total: 2.8 g/dL (ref 1.5–4.5)
Potassium: 4.2 mmol/L (ref 3.5–5.2)
Sodium: 140 mmol/L (ref 134–144)
TOTAL PROTEIN: 7.3 g/dL (ref 6.0–8.5)

## 2018-03-05 LAB — LIPID PANEL
CHOL/HDL RATIO: 3.8 ratio (ref 0.0–5.0)
Cholesterol, Total: 210 mg/dL — ABNORMAL HIGH (ref 100–199)
HDL: 55 mg/dL (ref >39)
LDL Calculated: 128 mg/dL — ABNORMAL HIGH (ref 0–99)
Triglycerides: 133 mg/dL (ref 0–149)
VLDL Cholesterol Cal: 27 mg/dL (ref 5–40)

## 2018-03-05 LAB — PSA: Prostate Specific Ag, Serum: 0.4 ng/mL (ref 0.0–4.0)

## 2018-03-05 LAB — HEMOGLOBIN A1C
Est. average glucose Bld gHb Est-mCnc: 180 mg/dL
HEMOGLOBIN A1C: 7.9 % — AB (ref 4.8–5.6)

## 2018-03-07 ENCOUNTER — Encounter: Payer: Self-pay | Admitting: Family Medicine

## 2018-03-14 DIAGNOSIS — Z8042 Family history of malignant neoplasm of prostate: Secondary | ICD-10-CM | POA: Insufficient documentation

## 2018-03-14 DIAGNOSIS — N400 Enlarged prostate without lower urinary tract symptoms: Secondary | ICD-10-CM | POA: Insufficient documentation

## 2018-03-14 DIAGNOSIS — R3129 Other microscopic hematuria: Secondary | ICD-10-CM | POA: Insufficient documentation

## 2018-03-14 DIAGNOSIS — Z87442 Personal history of urinary calculi: Secondary | ICD-10-CM | POA: Insufficient documentation

## 2018-03-27 ENCOUNTER — Other Ambulatory Visit: Payer: Self-pay | Admitting: Family Medicine

## 2018-03-27 DIAGNOSIS — E1165 Type 2 diabetes mellitus with hyperglycemia: Secondary | ICD-10-CM

## 2018-03-27 DIAGNOSIS — R609 Edema, unspecified: Secondary | ICD-10-CM

## 2018-03-27 NOTE — Telephone Encounter (Signed)
Requested Prescriptions  Pending Prescriptions Disp Refills  . furosemide (LASIX) 80 MG tablet [Pharmacy Med Name: FUROSEMIDE 80 MG TABLET] 180 tablet 1    Sig: TAKE 1 TABLET (80 MG TOTAL) BY MOUTH 2 (TWO) TIMES DAILY.     Cardiovascular:  Diuretics - Loop Failed - 03/27/2018  3:24 AM      Failed - Cr in normal range and within 360 days    Creat  Date Value Ref Range Status  02/24/2016 0.71 0.70 - 1.33 mg/dL Final    Comment:      For patients > or = 57 years of age: The upper reference limit for Creatinine is approximately 13% higher for people identified as African-American.      Creatinine, Ser  Date Value Ref Range Status  03/04/2018 0.72 (L) 0.76 - 1.27 mg/dL Final         Passed - K in normal range and within 360 days    Potassium  Date Value Ref Range Status  03/04/2018 4.2 3.5 - 5.2 mmol/L Final         Passed - Ca in normal range and within 360 days    Calcium  Date Value Ref Range Status  03/04/2018 9.1 8.7 - 10.2 mg/dL Final         Passed - Na in normal range and within 360 days    Sodium  Date Value Ref Range Status  03/04/2018 140 134 - 144 mmol/L Final         Passed - Last BP in normal range    BP Readings from Last 1 Encounters:  03/04/18 116/78         Passed - Valid encounter within last 6 months    Recent Outpatient Visits          3 weeks ago Annual physical exam   [facility] [place], Ranell Patrick, MD   3 months ago Phlebitis   [facility], Ranell Patrick, MD   3 months ago Thrombophlebitis   [facility] [place], Ranell Patrick, MD   6 months ago Influenza with pneumonia   [facility] [place], Ranell Patrick, MD   6 months ago Influenza A   [facility] [place], [place]      Future Appointments            In 2 months Carlota Raspberry, Ranell Patrick, MD [facility] [place], [facility]         . metFORMIN (GLUCOPHAGE) 500 MG tablet [Pharmacy Med Name: METFORMIN HCL 500 MG TABLET] 180 tablet 1    Sig: TAKE  1 TABLET BY MOUTH EVERY DAY FOR 7 DAYS THEN 1 TABLET TWICE A DAY     Endocrinology:  Diabetes - Biguanides Failed - 03/27/2018  3:24 AM      Failed - Cr in normal range and within 360 days    Creat  Date Value Ref Range Status  02/24/2016 0.71 0.70 - 1.33 mg/dL Final    Comment:      For patients > or = 57 years of age: The upper reference limit for Creatinine is approximately 13% higher for people identified as African-American.      Creatinine, Ser  Date Value Ref Range Status  03/04/2018 0.72 (L) 0.76 - 1.27 mg/dL Final         Passed - HBA1C is between 0 and 7.9 and within 180 days    Hgb A1c MFr Bld  Date Value  Ref Range Status  03/04/2018 7.9 (H) 4.8 - 5.6 % Final    Comment:             Prediabetes: 5.7 - 6.4          Diabetes: >6.4          Glycemic control for adults with diabetes: <7.0          Passed - eGFR in normal range and within 360 days    GFR, Est African American  Date Value Ref Range Status  02/24/2016 >89 >=60 mL/min Final   GFR calc Af Amer  Date Value Ref Range Status  03/04/2018 120 >59 mL/min/1.73 Final   GFR, Est Non African American  Date Value Ref Range Status  02/24/2016 >89 >=60 mL/min Final   GFR calc non Af Amer  Date Value Ref Range Status  03/04/2018 104 >59 mL/min/1.73 Final         Passed - Valid encounter within last 6 months    Recent Outpatient Visits          3 weeks ago Annual physical exam   [facility], Ranell Patrick, MD   3 months ago Phlebitis   [facility] [place], MD   3 months ago Thrombophlebitis   [facility] [place], Ranell Patrick, MD   6 months ago Influenza with pneumonia   [facility] [place], Ranell Patrick, MD   6 months ago Influenza A   [facility] at [place], [place]      Future Appointments            In 2 months Carlota Raspberry, Ranell Patrick, MD [facility] at [place], [facility]

## 2018-04-29 ENCOUNTER — Ambulatory Visit: Payer: BC Managed Care – PPO | Admitting: Family Medicine

## 2018-04-29 ENCOUNTER — Encounter: Payer: Self-pay | Admitting: Family Medicine

## 2018-04-29 ENCOUNTER — Other Ambulatory Visit: Payer: Self-pay

## 2018-04-29 VITALS — BP 140/81 | HR 82 | Temp 98.5°F | Ht 69.0 in | Wt 394.8 lb

## 2018-04-29 DIAGNOSIS — R6889 Other general symptoms and signs: Secondary | ICD-10-CM

## 2018-04-29 DIAGNOSIS — J0110 Acute frontal sinusitis, unspecified: Secondary | ICD-10-CM | POA: Diagnosis not present

## 2018-04-29 DIAGNOSIS — E119 Type 2 diabetes mellitus without complications: Secondary | ICD-10-CM

## 2018-04-29 DIAGNOSIS — H9201 Otalgia, right ear: Secondary | ICD-10-CM

## 2018-04-29 DIAGNOSIS — C9110 Chronic lymphocytic leukemia of B-cell type not having achieved remission: Secondary | ICD-10-CM | POA: Diagnosis not present

## 2018-04-29 DIAGNOSIS — H6981 Other specified disorders of Eustachian tube, right ear: Secondary | ICD-10-CM

## 2018-04-29 MED ORDER — FLUTICASONE PROPIONATE 50 MCG/ACT NA SUSP: 2.0000 | Freq: Every day | NASAL | 6 refills | Status: DC

## 2018-04-29 MED ORDER — AMOXICILLIN-POT CLAVULANATE 875-125 MG PO TABS: 1.0000 | ORAL_TABLET | Freq: Two times a day (BID) | ORAL | 0 refills | Status: DC

## 2018-04-29 NOTE — Progress Notes (Signed)
Patient ID: Adalberto Metzgar, male    DOB: 08-10-1960  Age: 57 y.o. MRN: [id number]  Chief Complaint  Patient presents with  . Nasal Congestion    since Thursday with throat pain. Feels lethargic. Taking sudafed for the symptoms  . Ear Pain    ears feel very hot  . Cough    productive cough    Subjective:   Ill-appearing gentleman, got sick about 4 days ago.  He has had head congestion and cough and drainage.  He has pain in his right ear.  He hurts over his right eye.  He does not smoke.  He is a Pharmacist, hospital.  He has a history of diabetes and CLL.  He will be retiring next year.  He has not been running a fever.  He did get a flu shot.  No major sore throat.  Coughing up a little drainage.  Current allergies, medications, problem list, past/family and social histories reviewed.  Objective:  BP 140/81 (BP Location: Left Arm, Patient Position: Sitting, Cuff Size: Large)   Pulse 82   Temp 98.5 F (36.9 C) (Oral)   Ht 5\' 9"  (1.753 m)   Wt (!) 394 lb 12.8 oz (179.1 kg)   SpO2 93%   BMI 58.30 kg/m   Ill-appearing.  TMs normal appearance.  Throat clear.  Neck supple without significant nodes.  Tender right frontal sinus.  Chest clear.  Heart rate without murmur.   Assessment & Plan:   Assessment: 1. Acute non-recurrent frontal sinusitis   2. Flu-like symptoms   3. CLL (chronic lymphocytic leukemia) ([place])   4. Type 2 diabetes mellitus without complication, without long-term current use of insulin ([place])   5. Right ear pain   6. Dysfunction of right eustachian tube       Plan:   No orders of the defined types were placed in this encounter.   Meds ordered this encounter  Medications  . fluticasone (FLONASE) 50 MCG/ACT nasal spray    Sig: Place 2 sprays into both nostrils daily.    Dispense:  16 g    Refill:  6  . amoxicillin-clavulanate (AUGMENTIN) 875-125 MG tablet    Sig: Take 1 tablet by mouth 2 (two) times daily.    Dispense:  20 tablet    Refill:  0          Patient Instructions    Drink plenty of fluids and get enough rest  Take the Augmentin (amoxicillin/clavulanate) 875 mg 1 twice daily  Take Mucinex (or generic thereof)  You can continue using the Sudafed if needed for congestion  Use the Flonase (fluticasone) nose spray 2 sprays each nostril twice daily for 3 or 4 days, then once daily.  With your overall health history, I encourage you to get rechecked if you feel like you are doing worse.   If you have lab work done today you will be contacted with your lab results within the next 2 weeks.  If you have not heard from [place] then please contact us. The fastest way to get your results is to register for My Chart.   IF you received an x-ray today, you will receive an invoice from [facility] Rad[facility]. Please contact [facility] Radiology at [phone] with questions or concerns regarding your invoice.   IF you received labwork today, you will receive an invoice from [place]. Please contact [facility] at [phone] with questions or concerns regarding your invoice.   Our billing staff will not be able to assist you with  questions regarding bills from these companies.  You will be contacted with the lab results as soon as they are available. The fastest way to get your results is to activate your My Chart account. Instructions are located on the last page of this paperwork. If you have not heard from [place] regarding the results in 2 weeks, please contact this office.        No follow-ups on file.   Ruben Reason, MD 04/29/2018

## 2018-04-29 NOTE — Patient Instructions (Addendum)
  Drink plenty of fluids and get enough rest  Take the Augmentin (amoxicillin/clavulanate) 875 mg 1 twice daily  Take Mucinex (or generic thereof)  You can continue using the Sudafed if needed for congestion  Use the Flonase (fluticasone) nose spray 2 sprays each nostril twice daily for 3 or 4 days, then once daily.  With your overall health history, I encourage you to get rechecked if you feel like you are doing worse.   If you have lab work done today you will be contacted with your lab results within the next 2 weeks.  If you have not heard from [place] then please contact us. The fastest way to get your results is to register for My Chart.   IF you received an x-ray today, you will receive an invoice from [facility]iology. Please contact [facility] Radiology at [phone] with questions or concerns regarding your invoice.   IF you received labwork today, you will receive an invoice from [place]. Please contact [facility] at [phone] with questions or concerns regarding your invoice.   Our billing staff will not be able to assist you with questions regarding bills from these companies.  You will be contacted with the lab results as soon as they are available. The fastest way to get your results is to activate your My Chart account. Instructions are located on the last page of this paperwork. If you have not heard from [place] regarding the results in 2 weeks, please contact this office.

## 2018-05-31 ENCOUNTER — Other Ambulatory Visit: Payer: Self-pay | Admitting: Family Medicine

## 2018-05-31 DIAGNOSIS — R609 Edema, unspecified: Secondary | ICD-10-CM

## 2018-06-10 ENCOUNTER — Ambulatory Visit: Payer: BC Managed Care – PPO | Admitting: Family Medicine

## 2018-06-12 ENCOUNTER — Ambulatory Visit: Payer: BC Managed Care – PPO | Admitting: Emergency Medicine

## 2018-06-26 ENCOUNTER — Other Ambulatory Visit: Payer: Self-pay

## 2018-06-26 ENCOUNTER — Ambulatory Visit: Payer: BC Managed Care – PPO | Admitting: Family Medicine

## 2018-06-26 ENCOUNTER — Encounter: Payer: Self-pay | Admitting: Family Medicine

## 2018-06-26 VITALS — BP 146/83 | HR 85 | Resp 16 | Ht 69.0 in | Wt 395.6 lb

## 2018-06-26 DIAGNOSIS — E119 Type 2 diabetes mellitus without complications: Secondary | ICD-10-CM | POA: Diagnosis not present

## 2018-06-26 DIAGNOSIS — R748 Abnormal levels of other serum enzymes: Secondary | ICD-10-CM

## 2018-06-26 DIAGNOSIS — I10 Essential (primary) hypertension: Secondary | ICD-10-CM

## 2018-06-26 LAB — GLUCOSE, POCT (MANUAL RESULT ENTRY): POC Glucose: 146 mg/dl — AB (ref 70–99)

## 2018-06-26 NOTE — Progress Notes (Signed)
Subjective:    Patient ID: Henry Hinton, male    DOB: 09-17-1960, 58 y.o.   MRN: [id number]  HPI Henry Hinton is a 58 y.o. male Presents today for: Chief Complaint  Patient presents with  . Diabetes    3 Month f/u increased metormin at last visit to 2 oills twice a day   . Hypertension    increased lasix totwice a day at last vissit  . elevated alkkaline     on lab last lab results stated will recheck alk phos at this visit    Diabetes: Currently on metformin 500 mg  - recommended increase to 1055m dosing BID after 03/2018 labs. Still taking 1 pill BID. Tolerating current dose.  Microalbumin: Check today.  Normal in February 2019. Optho, foot exam, pneumovax: up to date Not on ace/statin at present. Statin recommended previously. Still would like to decline statin at this time.   Diabetic Foot Exam - Simple   No data filed     Lab Results  Component Value Date   HGBA1C 7.9 (H) 03/04/2018   HGBA1C 8.6 (H) 09/28/2017   HGBA1C 8.1 (H) 06/01/2017   Lab Results  Component Value Date   MICROALBUR 0.7 02/24/2016   LDLCALC 128 (H) 03/04/2018   CREATININE 0.72 (L) 03/04/2018   Plans on returning this summer, and improved self-care at that time. Plans 12 month of attempt at weight loss - 35 -50 pounds, and sustained loss. If not then would consider gastric sleeve.  Has cut back on alcohol. Rare - less than 1/month.  Mild elevated alk phos prior.   Wt Readings from Last 3 Encounters:  06/26/18 (!) 395 lb 9.6 oz (179.4 kg)  04/29/18 (!) 394 lb 12.8 oz (179.1 kg)  03/04/18 (!) 388 lb 12.8 oz (176.4 kg)   Hypertension, with chronic edema. BP Readings from Last 3 Encounters:  06/26/18 (!) 146/83  04/29/18 140/81  03/04/18 116/78   Lab Results  Component Value Date   CREATININE 0.72 (L) 03/04/2018  Currently on Lasix 80 mg 1 and 1/2 QD (1293mQD during week, 16070mn weekends). potassium 20 mEq twice daily.  Seen by nephrology 2/11. Was on Lasix 80 mg daily at last  visit.  Has since  increased to twice daily Creatinine  0.86 on 2/11 with nephrology. BP 100/70 at that time.  Unable to use compression stockings due to calf size.   Chronic lymphocytic leukemia: Hematologist, Dr.Chinassami, last appt feb 3rd.  Based on review of that note there was some improvement in his white cell count, hemoglobin and platelet count within normal limits, no B symptoms, no new adenopathy and he clinically was doing well.  Was planning to continue to watch him with recheck in 3 months.   Patient Active Problem List   Diagnosis Date Noted  . BPH without obstruction/lower urinary tract symptoms 03/14/2018  . Family history of prostate cancer 03/14/2018  . History of nephrolithiasis 03/14/2018  . Microscopic hematuria 03/14/2018  . Diarrhea 11/20/2017  . Diabetes mellitus without complication ([id number] Iron deficiency anemia 11/19/2017  . Influenza, pneumonia 09/22/2017  . Abdominal wall cellulitis 01/22/2017  . Insect bite 01/22/2017  . Hyperkalemia 01/22/2017  . OSA (obstructive sleep apnea) 01/22/2017  . Essential hypertension   . Hoarseness 06/08/2016  . Vocal cord paralysis 01/31/2016  . Hypercalciuria 01/10/2016  . Cellulitis 11/28/2015  . Cellulitis of trunk 11/27/2015  . Nonspecific abnormal finding in stool contents   . Colon cancer screening   .  Venous stasis   . History of DVT (deep vein thrombosis)   . Sepsis ([place]) 10/09/2015  . Panniculitis 10/09/2015  . CLL (chronic lymphocytic leukemia) ([place]) 05/27/2015  . 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 Calculus of kidney 10/27/2014  . Leukocytosis (leucocytosis) 08/29/2014  . HNP (herniated nucleus pulposus), cervical 01/21/2013  . bilat hip replacement 04/08/2012  . Morbid obesity ([place]) 04/08/2012  . Edema 04/08/2012   Past Medical History:  Diagnosis Date  . Anemia   . Arthritis   . BMI 50.0-59.9, adult ([place])   . Cellulitis 11/2015   trunk  . cll 11/2014   CLL - 11/30/2014- Dr. Ronney Lion  Oncology [place], [place]  . DVT (deep venous thrombosis) ([place]) 6/16   Rt calf  took xarelto for 6 months  . Family history of anesthesia complication    " father having delirium after anesthesia"  . Heart murmur    Hx; of as a child  . History of kidney stones    x2  . Joint pain    Hx: of periodically  . Numbness and tingling    Hx: of in right arm  . Pneumonia   . Sleep apnea    Cpap use- settings 10   Past Surgical History:  Procedure Laterality Date  . BACK SURGERY    . COLONOSCOPY     Hx: of  . COLONOSCOPY N/A 11/23/2015   Procedure: COLONOSCOPY;  Surgeon: Irene Shipper, MD;  Location: [facility];  Service: Endoscopy;  Laterality: N/A;  . INGUINAL HERNIA REPAIR Left 06/01/2015   Procedure: LEFT INGUINAL HERNIA REPAIR WITH MESH;  Surgeon: Autumn Messing III, MD;  Location: [facility];  Service: General;  Laterality: Left;  . INGUINAL HERNIA REPAIR    . INSERTION OF MESH Left 06/01/2015   Procedure: INSERTION OF MESH;  Surgeon: Autumn Messing III, MD;  Location: [facility];  Service: General;  Laterality: Left;  . JOINT REPLACEMENT Bilateral    BTHA  . KNEE ARTHROSCOPY Left 07/21/2014   Dr. Noemi Chapel  . LUMBAR LAMINECTOMY  2005  . POSTERIOR CERVICAL LAMINECTOMY WITH MET- RX Right 01/21/2013   Procedure: Right Sitting C6-7 Microdiskectomy with Met-rex;  Surgeon: Kristeen Miss, MD;  Location: [facility];  Service: Neurosurgery;  Laterality: Right;  Right Sitting C6-7 Microdiskectomy with Met-rex  . STENT PLACEMENT RT URETER ([place] HX)  10/23/2014  . tenosynovitis  2000   Hx: of thumb  . TONSILLECTOMY    . TOTAL HIP ARTHROPLASTY Bilateral   . WISDOM TOOTH EXTRACTION  1974   all 4   Allergies  Allergen Reactions  . Adhesive [Tape] Other (See Comments)    Blisters and rash at site   Prior to Admission medications   Medication Sig Start Date End Date Taking? Authorizing Provider  amoxicillin-clavulanate (AUGMENTIN) 875-125 MG tablet Take 1 tablet by mouth 2 (two) times daily. 04/29/18  Yes  Posey Boyer, MD  aspirin EC 81 MG tablet Take 81 mg by mouth daily.   Yes [provider]  cephALEXin (KEFLEX) 500 MG capsule Take 1 capsule (500 mg total) by mouth 4 (four) times daily. 12/10/17  Yes [name], MD  ferrous sulfate (IRON SUPPLEMENT) 325 (65 FE) MG tablet Take 325 mg by mouth daily with breakfast.   Yes [provider]  fluticasone (FLONASE) 50 MCG/ACT nasal spray Place 2 sprays into both nostrils daily. 04/29/18  Yes Posey Boyer, MD  furosemide (LASIX) 80 MG tablet TAKE 1 TABLET (80 MG TOTAL) BY MOUTH 2 (  TWO) TIMES DAILY. 03/27/18  Yes [name], MD  ibuprofen (ADVIL,MOTRIN) 200 MG tablet Take 600 mg by mouth every 6 (six) hours as needed for mild pain.   Yes [provider]  KLOR-CON M20 20 MEQ tablet TAKE 1 TABLET BY MOUTH TWICE A DAY 05/31/18  Yes [name], MD  metFORMIN (GLUCOPHAGE) 500 MG tablet TAKE 1 TABLET BY MOUTH EVERY DAY FOR 7 DAYS THEN 1 TABLET TWICE A DAY 03/27/18  Yes [name], MD  Multiple Vitamins-Minerals (MULTIVITAMIN WITH MINERALS) tablet Take 1 tablet by mouth daily.   Yes [provider]   Social History   Socioeconomic History  . Marital status: Divorced    Spouse name: Not on file  . Number of children: 0  . Years of education: Not on file  . Highest education level: Not on file  Occupational History  . Occupation: Geophysical data processor: [employer]  . Financial resource strain: Not on file  . Food insecurity:    Worry: Not on file    Inability: Not on file  . Transportation needs:    Medical: Not on file    Non-medical: Not on file  Tobacco Use  . Smoking status: Never Smoker  . Smokeless tobacco: Former Systems developer    Types: Chew  Substance and Sexual Activity  . Alcohol use: Yes    Alcohol/week: 1.0 standard drinks    Types: 1 Standard drinks or equivalent per week    Comment: BEER AND LIQUOR  . Drug use: No  . Sexual  activity: Not Currently  Lifestyle  . Physical activity:    Days per week: Not on file    Minutes per session: Not on file  . Stress: Not on file  Relationships  . Social connections:    Talks on phone: Not on file    Gets together: Not on file    Attends religious service: Not on file    Active member of club or organization: Not on file    Attends meetings of clubs or organizations: Not on file    Relationship status: Not on file  . Intimate partner violence:    Fear of current or ex partner: Not on file    Emotionally abused: Not on file    Physically abused: Not on file    Forced sexual activity: Not on file  Other Topics Concern  . Not on file  Social History Narrative   Exercise swimming 3-5 times/week for 45 minutes     Review of Systems  Constitutional: Negative for fatigue and unexpected weight change.  Eyes: Negative for visual disturbance.  Respiratory: Negative for cough, chest tightness and shortness of breath.   Cardiovascular: Negative for chest pain, palpitations and leg swelling.  Gastrointestinal: Negative for abdominal pain and blood in stool.  Neurological: Negative for dizziness, light-headedness and headaches.       Objective:   Physical Exam Vitals signs reviewed.  Constitutional:      General: He is not in acute distress.    Appearance: He is well-developed. He is obese. He is not ill-appearing.  HENT:     Head: Normocephalic and atraumatic.  Eyes:     Pupils: Pupils are equal, round, and reactive to light.  Neck:     Vascular: No carotid bruit or JVD.  Cardiovascular:     Rate and Rhythm: Normal rate and regular rhythm.     Heart sounds: Normal heart sounds. No  murmur.  Pulmonary:     Effort: Pulmonary effort is normal.     Breath sounds: Normal breath sounds. No rales.  Musculoskeletal:     Right lower leg: Edema (bil LE, hyperpigmented, no ulceration/wounds. ) present.     Left lower leg: Edema present.  Skin:    General: Skin is  warm and dry.  Neurological:     Mental Status: He is alert and oriented to person, place, and time.    Vitals:   06/26/18 1538  BP: (!) 146/83  Pulse: 85  Resp: 16  SpO2: 96%  Weight: (!) 395 lb 9.6 oz (179.4 kg)  Height: '5\' 9"'  (1.753 m)      Assessment & Plan:  Henry Hinton is a 58 y.o. male Type 2 diabetes mellitus without complication, without long-term current use of insulin ([place]) - Plan: Hemoglobin A1c, POCT glucose (manual entry), HM DIABETES FOOT EXAM  -Check hemoglobin A1c, increased to 1000 mg twice daily Metformin if still elevated  Essential hypertension, benign - Plan: Comprehensive metabolic panel  -Borderline, check home readings.  No changes for now  Elevated alkaline phosphatase level - Plan: CANCELED: Alkaline phosphatase  -Check CMP.  Borderline previously, watch for significant increase.  Plans aggressive attempts at weight loss as above.  If not seeing significant results, will consider surgical options.  No orders of the defined types were placed in this encounter.  Patient Instructions   If A1c still elevated, try increasing metformin to 1000 mg twice per day. If diarrhea or new side effects would recommend GLP1.   No other med changes for now.   If you have lab work done today you will be contacted with your lab results within the next 2 weeks.  If you have not heard from [place] then please contact us. The fastest way to get your results is to register for My Chart.   IF you received an x-ray today, you will receive an invoice from [facility]. Please contact [facility] at [phone] with questions or concerns regarding your invoice.   IF you received labwork today, you will receive an invoice from [place]. Please contact [facility] at [phone] with questions or concerns regarding your invoice.   Our billing staff will not be able to assist you with questions regarding bills from these companies.  You will be contacted with  the lab results as soon as they are available. The fastest way to get your results is to activate your My Chart account. Instructions are located on the last page of this paperwork. If you have not heard from [place] regarding the results in 2 weeks, please contact this office.       Signed,   [name], MD [facility] at [facility].  06/29/18 7:52 AM

## 2018-06-26 NOTE — Patient Instructions (Addendum)
If A1c still elevated, try increasing metformin to 1000 mg twice per day. If diarrhea or new side effects would recommend GLP1.   No other med changes for now.   If you have lab work done today you will be contacted with your lab results within the next 2 weeks.  If you have not heard from [place] then please contact us. The fastest way to get your results is to register for My Chart.   IF you received an x-ray today, you will receive an invoice from [facility] Radiology. Please contact [facility]iology at [phone] with questions or concerns regarding your invoice.   IF you received labwork today, you will receive an invoice from [place]. Please contact [facility] at [phone] with questions or concerns regarding your invoice.   Our billing staff will not be able to assist you with questions regarding bills from these companies.  You will be contacted with the lab results as soon as they are available. The fastest way to get your results is to activate your My Chart account. Instructions are located on the last page of this paperwork. If you have not heard from [place] regarding the results in 2 weeks, please contact this office.

## 2018-06-27 LAB — COMPREHENSIVE METABOLIC PANEL
ALT: 32 IU/L (ref 0–44)
AST: 21 IU/L (ref 0–40)
Albumin/Globulin Ratio: 1.5 (ref 1.2–2.2)
Albumin: 4.3 g/dL (ref 3.8–4.9)
Alkaline Phosphatase: 134 IU/L — ABNORMAL HIGH (ref 39–117)
BUN/Creatinine Ratio: 26 — ABNORMAL HIGH (ref 9–20)
BUN: 21 mg/dL (ref 6–24)
Bilirubin Total: 0.3 mg/dL (ref 0.0–1.2)
CO2: 24 mmol/L (ref 20–29)
Calcium: 9.2 mg/dL (ref 8.7–10.2)
Chloride: 99 mmol/L (ref 96–106)
Creatinine, Ser: 0.81 mg/dL (ref 0.76–1.27)
GFR calc Af Amer: 114 mL/min/{1.73_m2} (ref >59)
GFR calc non Af Amer: 99 mL/min/{1.73_m2} (ref >59)
GLUCOSE: 159 mg/dL — AB (ref 65–99)
Globulin, Total: 2.8 g/dL (ref 1.5–4.5)
Potassium: 4.7 mmol/L (ref 3.5–5.2)
Sodium: 140 mmol/L (ref 134–144)
Total Protein: 7.1 g/dL (ref 6.0–8.5)

## 2018-06-27 LAB — HEMOGLOBIN A1C
ESTIMATED AVERAGE GLUCOSE: 200 mg/dL
Hgb A1c MFr Bld: 8.6 % — ABNORMAL HIGH (ref 4.8–5.6)

## 2018-06-29 ENCOUNTER — Encounter: Payer: Self-pay | Admitting: Family Medicine

## 2018-08-21 ENCOUNTER — Encounter: Payer: Self-pay | Admitting: Family Medicine

## 2018-08-21 ENCOUNTER — Other Ambulatory Visit: Payer: Self-pay | Admitting: Family Medicine

## 2018-08-21 ENCOUNTER — Other Ambulatory Visit: Payer: Self-pay | Admitting: *Deleted

## 2018-08-21 DIAGNOSIS — E1165 Type 2 diabetes mellitus with hyperglycemia: Secondary | ICD-10-CM

## 2018-08-21 DIAGNOSIS — R609 Edema, unspecified: Secondary | ICD-10-CM

## 2018-08-21 MED ORDER — FUROSEMIDE 80 MG PO TABS: 80.0000 mg | ORAL_TABLET | Freq: Two times a day (BID) | ORAL | 0 refills | Status: DC

## 2018-08-26 ENCOUNTER — Other Ambulatory Visit: Payer: Self-pay

## 2018-08-26 DIAGNOSIS — E1165 Type 2 diabetes mellitus with hyperglycemia: Secondary | ICD-10-CM

## 2018-08-28 MED ORDER — METFORMIN HCL 1000 MG PO TABS: 1000.0000 mg | ORAL_TABLET | Freq: Two times a day (BID) | ORAL | 1 refills | Status: DC

## 2018-08-29 ENCOUNTER — Other Ambulatory Visit: Payer: Self-pay | Admitting: Family Medicine

## 2018-08-29 DIAGNOSIS — R609 Edema, unspecified: Secondary | ICD-10-CM

## 2018-09-13 ENCOUNTER — Other Ambulatory Visit: Payer: Self-pay | Admitting: Family Medicine

## 2018-09-13 DIAGNOSIS — R609 Edema, unspecified: Secondary | ICD-10-CM

## 2018-09-13 NOTE — Telephone Encounter (Signed)
Requested medication (s) are due for refill today:   Yes  Requested medication (s) are on the active medication list:   Yes  Future visit scheduled:   Yes with Dr. Carlota Raspberry on 09/24/2018.   Last ordered: Had a 30 day supply approved/given by Nadara Mustard, CMA on 08/21/2018   Requested Prescriptions  Pending Prescriptions Disp Refills   furosemide (LASIX) 80 MG tablet [Pharmacy Med Name: FUROSEMIDE 80 MG TABLET] 60 tablet 0    Sig: TAKE 1 TABLET (80 MG TOTAL) BY MOUTH 2 TIMES DAILY.     Cardiovascular:  Diuretics - Loop Failed - 09/13/2018  9:35 AM      Failed - Last BP in normal range    BP Readings from Last 1 Encounters:  06/26/18 (!) 146/83         Passed - K in normal range and within 360 days    Potassium  Date Value Ref Range Status  06/26/2018 4.7 3.5 - 5.2 mmol/L Final         Passed - Ca in normal range and within 360 days    Calcium  Date Value Ref Range Status  06/26/2018 9.2 8.7 - 10.2 mg/dL Final         Passed - Na in normal range and within 360 days    Sodium  Date Value Ref Range Status  06/26/2018 140 134 - 144 mmol/L Final         Passed - Cr in normal range and within 360 days    Creat  Date Value Ref Range Status  02/24/2016 0.71 0.70 - 1.33 mg/dL Final    Comment:      For patients > or = 58 years of age: The upper reference limit for Creatinine is approximately 13% higher for people identified as African-American.      Creatinine, Ser  Date Value Ref Range Status  06/26/2018 0.81 0.76 - 1.27 mg/dL Final         Passed - Valid encounter within last 6 months    Recent Outpatient Visits          2 months ago Type 2 diabetes mellitus without complication, without long-term current use of insulin [facility])   [facility], Ranell Patrick, MD   4 months ago Acute non-recurrent frontal sinusitis   Primary Care at [facility], Fenton Malling, MD   6 months ago Annual physical exam   [facility], Ranell Patrick, MD   9 months  ago Phlebitis   [facility], Ranell Patrick, MD   9 months ago Thrombophlebitis   [facility], Ranell Patrick, MD      Future Appointments            In 1 week Carlota Raspberry Ranell Patrick, MD [facility] at [place], [facility]

## 2018-09-21 ENCOUNTER — Other Ambulatory Visit: Payer: Self-pay | Admitting: Family Medicine

## 2018-09-21 DIAGNOSIS — R609 Edema, unspecified: Secondary | ICD-10-CM

## 2018-09-21 NOTE — Telephone Encounter (Signed)
Requested medication (s) are due for refill today - yes  Requested medication (s) are on the active medication list - yes  Future visit scheduled - in 3 days  Last refill: 08/29/18  Notes to clinic: Patient has appointment on Tuesday- review for refill with additional at appointment  Requested Prescriptions  Pending Prescriptions Disp Refills   KLOR-CON M20 20 [place] tablet [Pharmacy Med Name: KLOR-CON M20 TABLET] 60 tablet 0    Sig: TAKE 1 [place]     Endocrinology:  Minerals - Potassium Supplementation Passed - 09/21/2018  2:32 PM      Passed - K in normal range and within 360 days    Potassium  Date Value Ref Range Status  06/26/2018 4.7 3.5 - 5.2 mmol/L Final         Passed - Cr in normal range and within 360 days    Creat  Date Value Ref Range Status  02/24/2016 0.71 0.70 - 1.33 mg/dL Final    Comment:      For patients > or = 58 years of age: The upper reference limit for Creatinine is approximately 13% higher for people identified as African-American.      Creatinine, Ser  Date Value Ref Range Status  06/26/2018 0.81 0.76 - 1.27 mg/dL Final         Passed - Valid encounter within last 12 months    Recent Outpatient Visits          2 months ago Type 2 diabetes mellitus without complication, without long-term current use of insulin ([place])   [facility], Ranell Patrick, MD   4 months ago Acute non-recurrent frontal sinusitis   [facility] at [facility], Fenton Malling, MD   6 months ago Annual physical exam   [facility], Ranell Patrick, MD   9 months ago Phlebitis   [facility], Ranell Patrick, MD   9 months ago Thrombophlebitis   [facility], Ranell Patrick, MD      Future Appointments            In 3 days [name], MD [facility] at [place], [facility]            Requested Prescriptions  Pending Prescriptions Disp Refills   KLOR-CON M20 20 [place] tablet [Pharmacy Med Name: KLOR-CON  M20 TABLET] 60 tablet 0    Sig: TAKE 1 [place] A DAY     Endocrinology:  Minerals - Potassium Supplementation Passed - 09/21/2018  2:32 PM      Passed - K in normal range and within 360 days    Potassium  Date Value Ref Range Status  06/26/2018 4.7 3.5 - 5.2 mmol/L Final         Passed - Cr in normal range and within 360 days    Creat  Date Value Ref Range Status  02/24/2016 0.71 0.70 - 1.33 mg/dL Final    Comment:      For patients > or = 58 years of age: The upper reference limit for Creatinine is approximately 13% higher for people identified as African-American.      Creatinine, Ser  Date Value Ref Range Status  06/26/2018 0.81 0.76 - 1.27 mg/dL Final         Passed - Valid encounter within last 12 months    Recent Outpatient Visits          2 months  ago Type 2 diabetes mellitus without complication, without long-term current use of insulin [facility])   [facility], Ranell Patrick, MD   4 months ago Acute non-recurrent frontal sinusitis   [facility] at [facility], Fenton Malling, MD   6 months ago Annual physical exam   [facility] at [facility], Ranell Patrick, MD   9 months ago Phlebitis   [facility], Ranell Patrick, MD   9 months ago Thrombophlebitis   [facility] at [facility], Ranell Patrick, MD      Future Appointments            In 3 days [name], MD Primary Care at [place], [facility]

## 2018-09-24 ENCOUNTER — Ambulatory Visit: Payer: BC Managed Care – PPO | Admitting: Family Medicine

## 2018-09-24 ENCOUNTER — Encounter: Payer: Self-pay | Admitting: Family Medicine

## 2018-09-24 ENCOUNTER — Other Ambulatory Visit: Payer: Self-pay

## 2018-09-24 VITALS — BP 140/80 | HR 94 | Temp 99.4°F | Resp 16 | Wt >= 6400 oz

## 2018-09-24 DIAGNOSIS — M7989 Other specified soft tissue disorders: Secondary | ICD-10-CM | POA: Diagnosis not present

## 2018-09-24 DIAGNOSIS — I809 Phlebitis and thrombophlebitis of unspecified site: Secondary | ICD-10-CM | POA: Diagnosis not present

## 2018-09-24 DIAGNOSIS — E119 Type 2 diabetes mellitus without complications: Secondary | ICD-10-CM | POA: Diagnosis not present

## 2018-09-24 LAB — POCT GLYCOSYLATED HEMOGLOBIN (HGB A1C): Hemoglobin A1C: 9.1 % — AB (ref 4.0–5.6)

## 2018-09-24 LAB — GLUCOSE, POCT (MANUAL RESULT ENTRY): POC Glucose: 223 mg/dl — AB (ref 70–99)

## 2018-09-24 NOTE — Progress Notes (Signed)
Subjective:    Patient ID: Henry Hinton, male    DOB: 1960/07/09, 58 y.o.   MRN: [id number]  HPI Henry Hinton is a 57 y.o. male Presents today for: Chief Complaint  Patient presents with  . Diabetes     3 month f/u on diabetes. Was last seen here on 06/26/2018 A1c was elevated increased metformin 1000mg  twice a day. but patient staedd he has been taking 1500mg . He take 1000mg  in am then take 500 mg in the pm. Which he stated has been doing well with that dose  . Recurrent Skin Infections    on back of both calf 1 week ago but now one on left hamstring came up in the last yday.  . Consult    was seen by Hemotology 2 weeks agop and had a ct scan which kidney stone. Just needed to let Dr Carlota Raspberry know and want    Diabetes: Uncontrolled in February, ultimately recommended increase metformin to 1000 g twice daily.  He has been taking 1000 mg the morning, 500 mg at night.  Felt dizzy/lethargic an hour after second dose of 1000mg .  Sometimes without food. Feels better with 500mg  evening dose. No lethargy with morning dose.  Not checking home readings.  Wt Readings from Last 3 Encounters:  09/24/18 (!) 406 lb 6.4 oz (184.3 kg)  06/26/18 (!) 395 lb 9.6 oz (179.4 kg)  04/29/18 (!) 394 lb 12.8 oz (179.1 kg)  home reading 398 yesterday am. Ate more this past weekend.  Would like to try medical approach to weight loss, then if not seeing 50-75# loss, would consider surgical approach.  Body mass index is 60.01 kg/m. Plans on 86 day meal plan temporarily.  Microalbumin: will check today.  Optho, foot exam, pneumovax: Up-to-date.  Lab Results  Component Value Date   HGBA1C 8.6 (H) 06/26/2018   HGBA1C 7.9 (H) 03/04/2018   HGBA1C 8.6 (H) 09/28/2017   Lab Results  Component Value Date   MICROALBUR 0.7 02/24/2016   LDLCALC 128 (H) 03/04/2018   CREATININE 0.81 06/26/2018   Recurrent skin infections: Previous history of recurrent cellulitis, abdominal wall cellulitis.  Thought to be in  part related to obesity, history of CLL, diabetes. Hist ory of phlebitis prior as well.  Has noticed a few areas of infection on his calves a week ago, now one on his left hamstring starting yesterday. Feels like phlebitis again.  Areas in calves - knotty, treats with heat/ice and ibuprofen - resolves in 5 days. ?Low grade temp 99.4. no cough, no new shortness of breath., HA, or change in taste/smell.  Medial left calf area noted past 3-4 days.   Nephrolithiasis: Noted by CT scan from hematology - plans to discuss at follow up appt.   Review of Systems Per HPI.     Objective:   Physical Exam Vitals signs reviewed.  Constitutional:      General: He is not in acute distress.    Appearance: He is well-developed. He is obese. He is not ill-appearing, toxic-appearing or diaphoretic.  HENT:     Head: Normocephalic and atraumatic.  Eyes:     Pupils: Pupils are equal, round, and reactive to light.  Neck:     Vascular: No carotid bruit or JVD.  Cardiovascular:     Rate and Rhythm: Normal rate and regular rhythm.     Heart sounds: Normal heart sounds. No murmur.  Pulmonary:     Effort: Pulmonary effort is normal.     Breath  sounds: Normal breath sounds. No rales.  Musculoskeletal:     Right lower leg: Edema (chronic changes lower bilaterally. ) present.     Left lower leg: Edema present.       Legs:  Skin:    General: Skin is warm and dry.  Neurological:     Mental Status: He is alert and oriented to person, place, and time.    Vitals:   09/24/18 1530  BP: 140/80  Pulse: 94  Resp: 16  Temp: 99.4 F (37.4 C)  TempSrc: Oral  SpO2: 95%  Weight: (!) 406 lb 6.4 oz (184.3 kg)   Results for orders placed or performed in visit on 09/24/18  POCT glycosylated hemoglobin (Hb A1C)  Result Value Ref Range   Hemoglobin A1C 9.1 (A) 4.0 - 5.6 %   HbA1c POC (<> result, manual entry)     HbA1c, POC (prediabetic range)     HbA1c, POC (controlled diabetic range)    POCT glucose (manual  entry)  Result Value Ref Range   POC Glucose 223 (A) 70 - 99 mg/dl       Assessment & Plan:   Henry Hinton is a 58 y.o. male Diabetes mellitus without complication ([place]) - Plan: POCT glycosylated hemoglobin (Hb A1C), Comprehensive metabolic panel, POCT glucose (manual entry), CANCELED: Microalbumin/Creatinine Ratio, Urine  -Uncontrolled and discussed recommendations for additional medication.  He would like to try to just increase metformin alone at this time with food to see if that is tolerated better.  Additionally plans on working on diet/weight loss and will meet with medical bariatric specialist.  Phlebitis - Plan: VAS [place] LOWER EXTREMITY VENOUS (DVT) Leg swelling - Plan: VAS [place] LOWER EXTREMITY VENOUS (DVT)  -Ultrasound obtained without signs of DVT.  Chronic superficial phlebitis noted.  Continued on heat, short-term ibuprofen  No orders of the defined types were placed in this encounter.  Patient Instructions   Schedule visit with weight management - they can advise you on diet, but let me know if you would like a referral to nutritionist as well.   Dennard Nip, MD Medical Weight Loss Management  . [place] to try metformin at 1000mg  twice per day WITH food, but will discuss other options at follow up.   Possible thrombophlebitis - apply heat, ibuprofen short term, but will check ultrasound tomorrow morning.  If any fever or worsening symptoms- go to emergency room.   Recheck with me in 3 days.   Return to the clinic or go to the nearest emergency room if any of your symptoms worsen or new symptoms occur.  Phlebitis Phlebitis is soreness and swelling (inflammation) of a vein. This can occur in your arms, legs, or torso (trunk), as well as deeper inside your body. Phlebitis is usually not serious when it occurs close to the surface of the body. However, it can cause serious problems when it occurs deeper inside the body. What are the causes? Phlebitis can be  caused by:  Having a needle, IV tube, or long, thin tube (catheter) put in the vein.  Getting certain medicines or solutions through an IV tube. Some medicines or solutions, such as antibiotics and cancer medicines, can irritate the vein.  Having an IV tube for a long period of time.  Having an IV placed on the part of the arm or hand that moves a lot.  A blood clot.  Infection of the vein.  Surgery on a vein. What increases the risk? The following factors  may make you more likely to develop this condition:  Being overweight or obese.  Pregnancy.  Cancer.  Reduced or slowing of blood flow through your veins. This can be caused by: ? Bed rest over a long period of time. ? Long distance travel. ? Injury. ? Surgery. ? Congestive heart failure. ? Inactivity.  Smoking.  Taking birth control pills or hormone replacement therapy.  Varicose veins.  Inflammatory diseases or blood disorders that increase clotting.  Taking illegal drugs through the vein.  Having a history of blood clots. What are the signs or symptoms? Symptoms of this condition include:  A red, tender, swollen, and painful area on your skin. Usually, the area is long and narrow, and may spread.  The affected area feeling warm to the touch.  Firmness along the center of the affected area.  Low fever. How is this diagnosed? This condition is diagnosed based on:  Your symptoms.  A physical exam.  Your medical history, including your family history. Other tests may be done to rule out other conditions, such as blood clots, especially if you have a history of blood clots or are at higher risk of developing blood clots. These may include:  Blood tests.  Ultrasound exam.  Genetic tests.  Biopsy. This is when a tissue is taken from the body for examination. This is rare. How is this treated? Treatment depends on the severity of the condition as well as the location of the inflammation. In most  cases, the condition is minor and gets better quickly. Treatment may include:  Applying a warm compress or heating pad.  Wearing compression stockings or bandages.  Medicines, such as: ? Anti-inflammatory medicines. ? Antibiotic medicines if an infection is present. ? Blood-thinning medicines if a blood clot is suspected or present, or if you have a history of blood clots or a blood disorder.  Removing an IV tube that may be causing the problem.  Using a different medicine or solution that will not irritate the vein. In rare cases, surgery may be needed to remove a damaged section of a vein. Follow these instructions at home: Managing pain, stiffness, and swelling  If directed, apply heat to the affected area as often as told by your health care provider. Use the heat source that your health care provider recommends, such as a moist heat pack or a heating pad. ? Place a towel between your skin and the heat source. ? Leave the heat on for 20-30 minutes. ? Remove the heat if your skin turns bright red. This is especially important if you are unable to feel pain, heat, or cold. You may have a greater risk of getting burned.  Raise (elevate) the affected area above the level of your heart while you are sitting or lying down. Medicines  Take over-the-counter and prescription medicines only as told by your health care provider.  If you were prescribed an antibiotic, take it as told by your health care provider. Do not stop using the antibiotic even if you start to feel better.  Carry a medical alert card or wear your medical alert jewelry to show that you are on blood thinners, if this applies. General instructions   If you have phlebitis in your legs: ? Avoid sitting or standing for a long time. ? Keep your legs moving. ? Try to take short walks to break up long periods of sitting. ? Try to avoid bed rest that lasts for long periods of time. Regular sleep is not  part of bed rest.   Wear compression stockings as told by your health care provider. These stockings reduce swelling in your legs and help to prevent blood clots. They also help to prevent the condition from coming back.  Do not use any products that contain nicotine or tobacco, such as cigarettes and e-cigarettes. If you need help quitting, ask your health care provider.  Keep all follow-up visits as told by your health care provider. This is important. This may include any follow-up blood tests. Contact a health care provider if:  You have unusual bruising or any bleeding problems.  Your symptoms do not get better, or your symptoms get worse.  You are on anti-inflammatory medicines and you develop abdominal pain. Get help right away if:  You suddenly have chest pain or trouble breathing.  You have a fever and your symptoms suddenly get worse.  You cough up blood.  You feel lightheaded or pass out.  You have severe pain and swelling in the affected arm or leg. These symptoms may represent a serious problem that is an emergency. Do not wait to see if the symptoms will go away. Get medical help right away. Call your local emergency services (911 in the [place] Do not drive yourself to the hospital. Summary  Phlebitis is soreness and swelling (inflammation) of a vein.  Phlebitis is usually not serious when it occurs close to the surface of the body. However, it can cause serious problems when it occurs deeper inside the body.  Treatment depends on the severity of the condition and the location of the inflammation.  Raise (elevate) the affected area above the level of your heart while you are sitting or lying down. This information is not intended to replace advice given to you by your health care provider. Make sure you discuss any questions you have with your health care provider. Document Released: 04/11/2001 Document Revised: 05/23/2016 Document Reviewed: 05/23/2016 Elsevier Interactive Patient  Education  [employer].  If you have lab work done today you will be contacted with your lab results within the next 2 weeks.  If you have not heard from [place] then please contact us. The fastest way to get your results is to register for My Chart.   IF you received an x-ray today, you will receive an invoice from [facility]. Please contact [facility] at [phone] with questions or concerns regarding your invoice.   IF you received labwork today, you will receive an invoice from [facility]. Please contact [facility] at [phone] with questions or concerns regarding your invoice.   Our billing staff will not be able to assist you with questions regarding bills from these companies.  You will be contacted with the lab results as soon as they are available. The fastest way to get your results is to activate your My Chart account. Instructions are located on the last page of this paperwork. If you have not heard from [place] regarding the results in 2 weeks, please contact this office.       Signed,   [name], MD [facility] [place].  09/27/18 9:53 AM

## 2018-09-24 NOTE — Patient Instructions (Addendum)
Schedule visit with weight management - they can advise you on diet, but let me know if you would like a referral to nutritionist as well.   Dennard Nip, MD Medical Weight Loss Management  . [place] to try metformin at 1000mg  twice per day WITH food, but will discuss other options at follow up.   Possible thrombophlebitis - apply heat, ibuprofen short term, but will check ultrasound tomorrow morning.  If any fever or worsening symptoms- go to emergency room.   Recheck with me in 3 days.   Return to the clinic or go to the nearest emergency room if any of your symptoms worsen or new symptoms occur.  Phlebitis Phlebitis is soreness and swelling (inflammation) of a vein. This can occur in your arms, legs, or torso (trunk), as well as deeper inside your body. Phlebitis is usually not serious when it occurs close to the surface of the body. However, it can cause serious problems when it occurs deeper inside the body. What are the causes? Phlebitis can be caused by:  Having a needle, IV tube, or long, thin tube (catheter) put in the vein.  Getting certain medicines or solutions through an IV tube. Some medicines or solutions, such as antibiotics and cancer medicines, can irritate the vein.  Having an IV tube for a long period of time.  Having an IV placed on the part of the arm or hand that moves a lot.  A blood clot.  Infection of the vein.  Surgery on a vein. What increases the risk? The following factors may make you more likely to develop this condition:  Being overweight or obese.  Pregnancy.  Cancer.  Reduced or slowing of blood flow through your veins. This can be caused by: ? Bed rest over a long period of time. ? Long distance travel. ? Injury. ? Surgery. ? Congestive heart failure. ? Inactivity.  Smoking.  Taking birth control pills or hormone replacement therapy.  Varicose veins.  Inflammatory diseases or blood disorders that increase  clotting.  Taking illegal drugs through the vein.  Having a history of blood clots. What are the signs or symptoms? Symptoms of this condition include:  A red, tender, swollen, and painful area on your skin. Usually, the area is long and narrow, and may spread.  The affected area feeling warm to the touch.  Firmness along the center of the affected area.  Low fever. How is this diagnosed? This condition is diagnosed based on:  Your symptoms.  A physical exam.  Your medical history, including your family history. Other tests may be done to rule out other conditions, such as blood clots, especially if you have a history of blood clots or are at higher risk of developing blood clots. These may include:  Blood tests.  Ultrasound exam.  Genetic tests.  Biopsy. This is when a tissue is taken from the body for examination. This is rare. How is this treated? Treatment depends on the severity of the condition as well as the location of the inflammation. In most cases, the condition is minor and gets better quickly. Treatment may include:  Applying a warm compress or heating pad.  Wearing compression stockings or bandages.  Medicines, such as: ? Anti-inflammatory medicines. ? Antibiotic medicines if an infection is present. ? Blood-thinning medicines if a blood clot is suspected or present, or if you have a history of blood clots or a blood disorder.  Removing an IV tube that may be causing the  problem.  Using a different medicine or solution that will not irritate the vein. In rare cases, surgery may be needed to remove a damaged section of a vein. Follow these instructions at home: Managing pain, stiffness, and swelling  If directed, apply heat to the affected area as often as told by your health care provider. Use the heat source that your health care provider recommends, such as a moist heat pack or a heating pad. ? Place a towel between your skin and the heat  source. ? Leave the heat on for 20-30 minutes. ? Remove the heat if your skin turns bright red. This is especially important if you are unable to feel pain, heat, or cold. You may have a greater risk of getting burned.  Raise (elevate) the affected area above the level of your heart while you are sitting or lying down. Medicines  Take over-the-counter and prescription medicines only as told by your health care provider.  If you were prescribed an antibiotic, take it as told by your health care provider. Do not stop using the antibiotic even if you start to feel better.  Carry a medical alert card or wear your medical alert jewelry to show that you are on blood thinners, if this applies. General instructions   If you have phlebitis in your legs: ? Avoid sitting or standing for a long time. ? Keep your legs moving. ? Try to take short walks to break up long periods of sitting. ? Try to avoid bed rest that lasts for long periods of time. Regular sleep is not part of bed rest.  Wear compression stockings as told by your health care provider. These stockings reduce swelling in your legs and help to prevent blood clots. They also help to prevent the condition from coming back.  Do not use any products that contain nicotine or tobacco, such as cigarettes and e-cigarettes. If you need help quitting, ask your health care provider.  Keep all follow-up visits as told by your health care provider. This is important. This may include any follow-up blood tests. Contact a health care provider if:  You have unusual bruising or any bleeding problems.  Your symptoms do not get better, or your symptoms get worse.  You are on anti-inflammatory medicines and you develop abdominal pain. Get help right away if:  You suddenly have chest pain or trouble breathing.  You have a fever and your symptoms suddenly get worse.  You cough up blood.  You feel lightheaded or pass out.  You have severe pain  and swelling in the affected arm or leg. These symptoms may represent a serious problem that is an emergency. Do not wait to see if the symptoms will go away. Get medical help right away. Call your local emergency services (911 in the [place] Do not drive yourself to the hospital. Summary  Phlebitis is soreness and swelling (inflammation) of a vein.  Phlebitis is usually not serious when it occurs close to the surface of the body. However, it can cause serious problems when it occurs deeper inside the body.  Treatment depends on the severity of the condition and the location of the inflammation.  Raise (elevate) the affected area above the level of your heart while you are sitting or lying down. This information is not intended to replace advice given to you by your health care provider. Make sure you discuss any questions you have with your health care provider. Document Released: 04/11/2001 Document Revised: 05/23/2016 Document Reviewed:  05/23/2016 Elsevier Interactive Patient Education  [employer].  If you have lab work done today you will be contacted with your lab results within the next 2 weeks.  If you have not heard from [place] then please contact us. The fastest way to get your results is to register for My Chart.   IF you received an x-ray today, you will receive an invoice from [facility]. Please contact [facility] at [phone] with questions or concerns regarding your invoice.   IF you received labwork today, you will receive an invoice from [facility]. Please contact [facility] at [phone] with questions or concerns regarding your invoice.   Our billing staff will not be able to assist you with questions regarding bills from these companies.  You will be contacted with the lab results as soon as they are available. The fastest way to get your results is to activate your My Chart account. Instructions are located on the last page of this paperwork. If you  have not heard from [place] regarding the results in 2 weeks, please contact this office.

## 2018-09-25 ENCOUNTER — Other Ambulatory Visit: Payer: Self-pay

## 2018-09-25 ENCOUNTER — Ambulatory Visit (HOSPITAL_COMMUNITY)
Admission: RE | Admit: 2018-09-25 | Discharge: 2018-09-25 | Disposition: A | Discharge status: Home / Self Care | Payer: BC Managed Care – PPO | Source: Ambulatory Visit | Attending: Family Medicine | Admitting: Family Medicine

## 2018-09-25 DIAGNOSIS — M7989 Other specified soft tissue disorders: Secondary | ICD-10-CM | POA: Insufficient documentation

## 2018-09-25 DIAGNOSIS — I809 Phlebitis and thrombophlebitis of unspecified site: Secondary | ICD-10-CM | POA: Insufficient documentation

## 2018-09-25 LAB — COMPREHENSIVE METABOLIC PANEL
ALT: 27 IU/L (ref 0–44)
AST: 17 IU/L (ref 0–40)
Albumin/Globulin Ratio: 1.6 (ref 1.2–2.2)
Albumin: 3.9 g/dL (ref 3.8–4.9)
Alkaline Phosphatase: 132 IU/L — ABNORMAL HIGH (ref 39–117)
BUN/Creatinine Ratio: 15 (ref 9–20)
BUN: 16 mg/dL (ref 6–24)
Bilirubin Total: 0.4 mg/dL (ref 0.0–1.2)
CO2: 23 mmol/L (ref 20–29)
Calcium: 8.5 mg/dL — ABNORMAL LOW (ref 8.7–10.2)
Chloride: 97 mmol/L (ref 96–106)
Creatinine, Ser: 1.07 mg/dL (ref 0.76–1.27)
GFR calc Af Amer: 88 mL/min/{1.73_m2} (ref >59)
GFR calc non Af Amer: 76 mL/min/{1.73_m2} (ref >59)
Globulin, Total: 2.5 g/dL (ref 1.5–4.5)
Glucose: 230 mg/dL — ABNORMAL HIGH (ref 65–99)
Potassium: 4.4 mmol/L (ref 3.5–5.2)
Sodium: 136 mmol/L (ref 134–144)
Total Protein: 6.4 g/dL (ref 6.0–8.5)

## 2018-09-25 NOTE — Progress Notes (Signed)
Bilateral lower extremity venous duplex completed. Refer to "CV Proc" under chart review to view preliminary results.  09/25/2018 10:41 AM Maudry Mayhew, MHA, RVT, RDCS, RDMS

## 2018-09-27 ENCOUNTER — Ambulatory Visit: Payer: BC Managed Care – PPO | Admitting: Family Medicine

## 2018-09-27 ENCOUNTER — Encounter: Payer: Self-pay | Admitting: Family Medicine

## 2018-09-27 ENCOUNTER — Other Ambulatory Visit: Payer: Self-pay

## 2018-09-27 VITALS — BP 147/82 | HR 94 | Temp 98.8°F | Resp 18 | Ht 69.0 in | Wt >= 6400 oz

## 2018-09-27 DIAGNOSIS — I8002 Phlebitis and thrombophlebitis of superficial vessels of left lower extremity: Secondary | ICD-10-CM

## 2018-09-27 DIAGNOSIS — E1165 Type 2 diabetes mellitus with hyperglycemia: Secondary | ICD-10-CM | POA: Diagnosis not present

## 2018-09-27 DIAGNOSIS — N200 Calculus of kidney: Secondary | ICD-10-CM

## 2018-09-27 DIAGNOSIS — N281 Cyst of kidney, acquired: Secondary | ICD-10-CM

## 2018-09-27 NOTE — Progress Notes (Signed)
Subjective:    Patient ID: Henry Hinton, male    DOB: [birth year], 58 y.o.   MRN: [id number]  HPI Henry Hinton is a 58 y.o. male Presents today for: Chief Complaint  Patient presents with  . Diabetes    follow up and review CT scan    Here for follow-up, seen 3 days ago  Diabetes See last office visit.  A1c elevated at 9.1.  Discussed unlikely control with just increasing metformin but he would like to try that approach first, and will try second dose with food to see if that is tolerated better at higher dosing.  Plans on medical bariatric specialist evaluation as well for weight loss. Called Dr. Leafy Ro yesterday. Planning on appt soon.  Now at 1069m BID with food past few nights - no issues.  No fh of multiple endocrine neoplasias, pancreatitis.  Still would like to hold on new meds, but considering GLP-1. Would liek to try medical bariatric approach first.   Thrombophlebitis of legs.  See prior visit.  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  Heat, ibuprofen treatment recommended.  He has had chronic lower extremity edema, and has been treated with furosemide 80 mg BID. Discussed at nephrology visit in February. Still some soreness. Hot compresses 5-6 times per day. Ibuprofen 8043mBID. Feels like starting to resolve.  No fever. Some soreness.  Redness stable, less hot feeling.   Nephrolithiasis: [facility] were 4 nonobstructing right renal calculi measuring up to 5 mm.  Questionable area on the right upper kidney with heterogeneous perfusion possible infection/pyelonephritis.  Left kidney was notable for 11 mm cyst in the anterior upper pole no hydronephrosis. Occasional back spasm, but no passing blood.  On some days, darker yellow urine at time. ?something passing - doing ok now.   Urology: last appt in November.    Patient Active Problem List   Diagnosis Date Noted  . BPH without  obstruction/lower urinary tract symptoms 03/14/2018  . Family history of prostate cancer 03/14/2018  . History of nephrolithiasis 03/14/2018  . Microscopic hematuria 03/14/2018  . Diarrhea 11/20/2017  . Diabetes mellitus without complication ([id number]. Iron deficiency anemia 11/19/2017  . Influenza, pneumonia 09/22/2017  . Abdominal wall cellulitis 01/22/2017  . Insect bite 01/22/2017  . Hyperkalemia 01/22/2017  . OSA (obstructive sleep apnea) 01/22/2017  . Essential hypertension   . Hoarseness 06/08/2016  . Vocal cord paralysis 01/31/2016  . Hypercalciuria 01/10/2016  . Cellulitis 11/28/2015  . Cellulitis of trunk 11/27/2015  . Nonspecific abnormal finding in stool contents   . Colon cancer screening   . Venous stasis   . History of DVT (deep vein thrombosis)   . Sepsis (HCTrego06/01/2016  . Panniculitis 10/09/2015  . CLL (chronic lymphocytic leukemia) (HCHigh [place]01/26/2017  . 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 Calculus of kidney 10/27/2014  . Leukocytosis (leucocytosis) 08/29/2014  . HNP (herniated nucleus pulposus), cervical 01/21/2013  . bilat hip replacement 04/08/2012  . Morbid obesity (HCElizabeth12/12/2011  . Edema 04/08/2012   Past Medical History:  Diagnosis Date  . Anemia   . Arthritis   . BMI 50.0-59.9, adult (HCDwight  . Cellulitis 11/2015   trunk  . cll 11/2014   CLL - 11/30/2014- [place]. [facility] [place]. DVT (deep venous thrombosis) (HCHavana6/16   Rt calf  took xarelto for 6 months  . Family history of anesthesia  complication    " father having delirium after anesthesia"  . Heart murmur    Hx; of as a child  . History of kidney stones    x2  . Joint pain    Hx: of periodically  . Numbness and tingling    Hx: of in right arm  . Pneumonia   . Sleep apnea    Cpap use- settings 10   Past Surgical History:  Procedure Laterality Date  . BACK SURGERY    . COLONOSCOPY     Hx: of  . COLONOSCOPY N/A 11/23/2015   Procedure: COLONOSCOPY;   Surgeon: Irene Shipper, MD;  Location: [facility];  Service: Endoscopy;  Laterality: N/A;  . INGUINAL HERNIA REPAIR Left 06/01/2015   Procedure: LEFT INGUINAL HERNIA REPAIR WITH MESH;  Surgeon: Autumn Messing III, MD;  Location: [facility];  Service: General;  Laterality: Left;  . INGUINAL HERNIA REPAIR    . INSERTION OF MESH Left 06/01/2015   Procedure: INSERTION OF MESH;  Surgeon: Autumn Messing III, MD;  Location: [facility];  Service: General;  Laterality: Left;  . JOINT REPLACEMENT Bilateral    BTHA  . KNEE ARTHROSCOPY Left 07/21/2014   Dr. Noemi Chapel  . LUMBAR LAMINECTOMY  2005  . POSTERIOR CERVICAL LAMINECTOMY WITH MET- RX Right 01/21/2013   Procedure: Right Sitting C6-7 Microdiskectomy with Met-rex;  Surgeon: Kristeen Miss, MD;  Location: [facility];  Service: Neurosurgery;  Laterality: Right;  Right Sitting C6-7 Microdiskectomy with Met-rex  . STENT PLACEMENT RT URETER ([place] [facility]X)  10/23/2014  . tenosynovitis  2000   Hx: of thumb  . TONSILLECTOMY    . TOTAL HIP ARTHROPLASTY Bilateral   . WISDOM TOOTH EXTRACTION  1974   all 4   Allergies  Allergen Reactions  . Adhesive [Tape] Other (See Comments)    Blisters and rash at site   Prior to Admission medications   Medication Sig Start Date End Date Taking? Authorizing Provider  aspirin EC 81 MG tablet Take 81 mg by mouth daily.   Yes [provider]  ferrous sulfate (IRON SUPPLEMENT) 325 (65 FE) MG tablet Take 325 mg by mouth daily with breakfast.   Yes [provider]  furosemide (LASIX) 80 MG tablet TAKE 1 TABLET (80 MG TOTAL) BY MOUTH 2 TIMES DAILY. 09/18/18  Yes [name], MD  ibuprofen (ADVIL,MOTRIN) 200 MG tablet Take 600 mg by mouth every 6 (six) hours as needed for mild pain.   Yes [provider]  KLOR-CON M20 20 MEQ tablet TAKE 1 TABLET BY MOUTH TWICE A DAY 09/24/18  Yes [name], MD  metFORMIN (GLUCOPHAGE) 1000 MG tablet Take 1 tablet (1,000 mg total) by mouth 2 (two) times daily with a meal. 08/28/18   Yes [name], MD  Multiple Vitamins-Minerals (MULTIVITAMIN WITH MINERALS) tablet Take 1 tablet by mouth daily.   Yes [provider]   Social History   Socioeconomic History  . Marital status: Divorced    Spouse name: Not on file  . Number of children: 0  . Years of education: Not on file  . Highest education level: Not on file  Occupational History  . Occupation: Geophysical data processor: [employer]  . Financial resource strain: Not on file  . Food insecurity:    Worry: Not on file    Inability: Not on file  . Transportation needs:    Medical: Not on file    Non-medical: Not  on file  Tobacco Use  . Smoking status: Never Smoker  . Smokeless tobacco: Former Systems developer    Types: Chew  Substance and Sexual Activity  . Alcohol use: Yes    Alcohol/week: 1.0 standard drinks    Types: 1 Standard drinks or equivalent per week    Comment: BEER AND LIQUOR  . Drug use: No  . Sexual activity: Not Currently  Lifestyle  . Physical activity:    Days per week: Not on file    Minutes per session: Not on file  . Stress: Not on file  Relationships  . Social connections:    Talks on phone: Not on file    Gets together: Not on file    Attends religious service: Not on file    Active member of club or organization: Not on file    Attends meetings of clubs or organizations: Not on file    Relationship status: Not on file  . Intimate partner violence:    Fear of current or ex partner: Not on file    Emotionally abused: Not on file    Physically abused: Not on file    Forced sexual activity: Not on file  Other Topics Concern  . Not on file  Social History Narrative   Exercise swimming 3-5 times/week for 45 minutes    Review of Systems Per HPI.     Objective:   Physical Exam Constitutional:      General: He is not in acute distress.    Appearance: He is well-developed. He is obese.  HENT:     Head: Normocephalic and  atraumatic.  Cardiovascular:     Rate and Rhythm: Normal rate.  Pulmonary:     Effort: Pulmonary effort is normal.  Musculoskeletal:       Legs:  Neurological:     Mental Status: He is alert and oriented to person, place, and time.    Vitals:   09/27/18 0933  BP: (!) 147/82  Pulse: 94  Resp: 18  Temp: 98.8 F (37.1 C)  TempSrc: Oral  SpO2: 95%  Weight: (!) 404 lb (183.3 kg)  Height: '5\' 9"'  (1.753 m)       Assessment & Plan:  Henry Hinton is a 58 y.o. male Type 2 diabetes mellitus with hyperglycemia, without long-term current use of insulin (HCC)  -Discussed A1c and need to get below 7.  Unlikely that just increase the dose of metformin in the evening will help significantly but does plan on significant change in weight loss, and will be meeting with medical bariatric specialist.  Option of additional meds for now including GLP-1 was discussed but he would like to wait at this time.  Recheck 3 months  Thrombophlebitis of superficial veins of left lower extremity  -Improving with heat, ibuprofen.  Continue to monitor for worsening.  Nephrolith Renal cyst  -Has urologist, he plans to call for appointment to discuss the renal cyst and nephroliths.  Can place referral if needed.  No orders of the defined types were placed in this encounter.  Patient Instructions    Call urologist to follow up on CT results with cyst and kidney stones. Let me know if a referral is needed.   Okay to continue metformin at higher dose for now, but I would consider adding additional medicine as we discussed.  She apparently remains elevated at next visit.  Let me know how things are going with medical bariatric specialist as I suspect that will also help..  Follow-up  in 3 months   If you have lab work done today you will be contacted with your lab results within the next 2 weeks.  If you have not heard from [place] then please contact us. The fastest way to get your results is to register for My  Chart.   IF you received an x-ray today, you will receive an invoice from [facility]. Please contact [facility] at [phone] with questions or concerns regarding your invoice.   IF you received labwork today, you will receive an invoice from [facility]. Please contact [facility] at [phone] with questions or concerns regarding your invoice.   Our billing staff will not be able to assist you with questions regarding bills from these companies.  You will be contacted with the lab results as soon as they are available. The fastest way to get your results is to activate your My Chart account. Instructions are located on the last page of this paperwork. If you have not heard from [place] regarding the results in 2 weeks, please contact this office.      Signed,   [name], MD [facility] [place].  09/28/18 8:55 AM

## 2018-09-27 NOTE — Patient Instructions (Addendum)
  Call urologist to follow up on CT results with cyst and kidney stones. Let me know if a referral is needed.   Okay to continue metformin at higher dose for now, but I would consider adding additional medicine as we discussed.  She apparently remains elevated at next visit.  Let me know how things are going with medical bariatric specialist as I suspect that will also help..  Follow-up in 3 months   If you have lab work done today you will be contacted with your lab results within the next 2 weeks.  If you have not heard from [place] then please contact us. The fastest way to get your results is to register for My Chart.   IF you received an x-ray today, you will receive an invoice from [facility]. Please contact [facility]iology at [phone] with questions or concerns regarding your invoice.   IF you received labwork today, you will receive an invoice from [place] [facility]. Please contact [facility] at [phone] with questions or concerns regarding your invoice.   Our billing staff will not be able to assist you with questions regarding bills from these companies.  You will be contacted with the lab results as soon as they are available. The fastest way to get your results is to activate your My Chart account. Instructions are located on the last page of this paperwork. If you have not heard from [place] regarding the results in 2 weeks, please contact this office.

## 2018-09-28 ENCOUNTER — Encounter: Payer: Self-pay | Admitting: Family Medicine

## 2018-09-30 LAB — MICROALBUMIN / CREATININE URINE RATIO
Creatinine, Urine: 101.1 mg/dL
Microalb/Creat Ratio: <3 mg/g creat (ref 0–29)
Microalbumin, Urine: <3 ug/mL

## 2018-10-19 ENCOUNTER — Other Ambulatory Visit: Payer: Self-pay | Admitting: Family Medicine

## 2018-10-19 DIAGNOSIS — R609 Edema, unspecified: Secondary | ICD-10-CM

## 2018-10-25 ENCOUNTER — Other Ambulatory Visit: Payer: Self-pay | Admitting: Family Medicine

## 2018-10-25 DIAGNOSIS — H6981 Other specified disorders of Eustachian tube, right ear: Secondary | ICD-10-CM

## 2018-10-25 DIAGNOSIS — J0110 Acute frontal sinusitis, unspecified: Secondary | ICD-10-CM

## 2018-10-25 NOTE — Telephone Encounter (Signed)
Forwarding medication refill to PCP for review. 

## 2018-11-16 ENCOUNTER — Other Ambulatory Visit: Payer: Self-pay | Admitting: Family Medicine

## 2018-11-16 DIAGNOSIS — R609 Edema, unspecified: Secondary | ICD-10-CM

## 2018-11-17 NOTE — Telephone Encounter (Signed)
Requested Prescriptions  Pending Prescriptions Disp Refills  . furosemide (LASIX) 80 MG tablet [Pharmacy Med Name: FUROSEMIDE 80 MG TABLET] 60 tablet 0    Sig: TAKE 1 TABLET BY MOUTH TWICE A DAY     Cardiovascular:  Diuretics - Loop Failed - 11/16/2018  1:31 PM      Failed - Ca in normal range and within 360 days    Calcium  Date Value Ref Range Status  09/24/2018 8.5 (L) 8.7 - 10.2 mg/dL Final         Failed - Last BP in normal range    BP Readings from Last 1 Encounters:  09/27/18 (!) 147/82         Passed - K in normal range and within 360 days    Potassium  Date Value Ref Range Status  09/24/2018 4.4 3.5 - 5.2 mmol/L Final         Passed - Na in normal range and within 360 days    Sodium  Date Value Ref Range Status  09/24/2018 136 134 - 144 mmol/L Final         Passed - Cr in normal range and within 360 days    Creat  Date Value Ref Range Status  02/24/2016 0.71 0.70 - 1.33 mg/dL Final    Comment:      For patients > or = 58 years of age: The upper reference limit for Creatinine is approximately 13% higher for people identified as African-American.      Creatinine, Ser  Date Value Ref Range Status  09/24/2018 1.07 0.76 - 1.27 mg/dL Final         Passed - Valid encounter within last 6 months    Recent Outpatient Visits          1 month ago Type 2 diabetes mellitus with hyperglycemia, without long-term current use of insulin [facility])   [facility] [place], Ranell Patrick, MD   1 month ago Diabetes mellitus without complication [facility])   [facility] [place], Ranell Patrick, MD   4 months ago Type 2 diabetes mellitus without complication, without long-term current use of insulin [facility])   [facility] [place], Ranell Patrick, MD   6 months ago Acute non-recurrent frontal sinusitis   [facility] at [facility], Fenton Malling, MD   8 months ago Annual physical exam   [facility] [place], Ranell Patrick, MD      Future Appointments            In 1  month Carlota Raspberry Ranell Patrick, MD [facility] [place]           . KLOR-CON M20 20 MEQ tablet [Pharmacy Med Name: KLOR-CON M20 TABLET] 60 tablet 0    Sig: TAKE 1 TABLET BY MOUTH TWICE A DAY     Endocrinology:  Minerals - Potassium Supplementation Passed - 11/16/2018  1:31 PM      Passed - K in normal range and within 360 days    Potassium  Date Value Ref Range Status  09/24/2018 4.4 3.5 - 5.2 mmol/L Final         Passed - Cr in normal range and within 360 days    Creat  Date Value Ref Range Status  02/24/2016 0.71 0.70 - 1.33 mg/dL Final    Comment:      For patients > or = 58 years of age: The upper reference limit for Creatinine is approximately 13% higher for people  identified as African-American.      Creatinine, Ser  Date Value Ref Range Status  09/24/2018 1.07 0.76 - 1.27 mg/dL Final         Passed - Valid encounter within last 12 months    Recent Outpatient Visits          1 month ago Type 2 diabetes mellitus with hyperglycemia, without long-term current use of insulin [facility])   [facility] [place] [facility], Ranell Patrick, MD   1 month ago Diabetes mellitus without complication [facility])   [facility] [place] [facility], Ranell Patrick, MD   4 months ago Type 2 diabetes mellitus without complication, without long-term current use of insulin [facility])   [facility] [place], Ranell Patrick, MD   6 months ago Acute non-recurrent frontal sinusitis   Primary [facility] at [facility], Fenton Malling, MD   8 months ago Annual physical exam   [facility] [place], Ranell Patrick, MD      Future Appointments            In 1 month Carlota Raspberry Ranell Patrick, MD [facility] at [place], [facility]

## 2018-12-18 ENCOUNTER — Other Ambulatory Visit: Payer: Self-pay | Admitting: Family Medicine

## 2018-12-18 DIAGNOSIS — R609 Edema, unspecified: Secondary | ICD-10-CM

## 2018-12-25 ENCOUNTER — Encounter: Payer: Self-pay | Admitting: Family Medicine

## 2018-12-25 ENCOUNTER — Other Ambulatory Visit: Payer: Self-pay

## 2018-12-25 ENCOUNTER — Ambulatory Visit: Payer: BC Managed Care – PPO | Admitting: Family Medicine

## 2018-12-25 VITALS — BP 135/80 | HR 80 | Temp 98.3°F | Resp 18 | Wt 380.8 lb

## 2018-12-25 DIAGNOSIS — E1165 Type 2 diabetes mellitus with hyperglycemia: Secondary | ICD-10-CM | POA: Diagnosis not present

## 2018-12-25 DIAGNOSIS — E785 Hyperlipidemia, unspecified: Secondary | ICD-10-CM | POA: Diagnosis not present

## 2018-12-25 DIAGNOSIS — Z6841 Body Mass Index (BMI) 40.0 and over, adult: Secondary | ICD-10-CM | POA: Diagnosis not present

## 2018-12-25 NOTE — Progress Notes (Signed)
Subjective:    Patient ID: Allan Minotti, male    DOB: 1961-04-11, 58 y.o.   MRN: [id number]  HPI Maxfield Gildersleeve is a 58 y.o. male Presents today for: Chief Complaint  Patient presents with  . Diabetes    3 month f/u   Diabetes:  Discussed in May.  Higher dose of metformin at 1000 mg twice daily had been continued for the previous few days.  Plan for meeting with medical bariatric specialist for weight loss. He has lost 24 pounds since May visit. Swimming 6 days per week for 1 hour,10-11k steps per day, and adjusted diet- 400 calories 3 times per day with 100 calorie snacks. Trying to stay below 1544m per day. Avoiding fried/fast food, eating more whole foods.  Microalbumin: Normal in May  Optho, foot exam, pneumovax: up to date.   Statin discussed - does not want to start statin. Understanding expressed of recommendations for Diabetes and even intermittent dosing- declined at present.   Wt Readings from Last 3 Encounters:  12/25/18 (!) 380 lb 12.8 oz (172.7 kg)  09/27/18 (!) 404 lb (183.3 kg)  09/24/18 (!) 406 lb 6.4 oz (184.3 kg)    Lab Results  Component Value Date   HGBA1C 9.1 (A) 09/24/2018   HGBA1C 8.6 (H) 06/26/2018   HGBA1C 7.9 (H) 03/04/2018   Lab Results  Component Value Date   MICROALBUR 0.7 02/24/2016   LDLCALC 128 (H) 03/04/2018   CREATININE 1.07 09/24/2018    Patient Active Problem List   Diagnosis Date Noted  . BPH without obstruction/lower urinary tract symptoms 03/14/2018  . Family history of prostate cancer 03/14/2018  . History of nephrolithiasis 03/14/2018  . Microscopic hematuria 03/14/2018  . Diarrhea 11/20/2017  . Diabetes mellitus without complication ([place] 030/86/5784 . Iron deficiency anemia 11/19/2017  . Influenza, pneumonia 09/22/2017  . Abdominal wall cellulitis 01/22/2017  . Insect bite 01/22/2017  . Hyperkalemia 01/22/2017  . OSA (obstructive sleep apnea) 01/22/2017  . Essential hypertension   . Hoarseness 06/08/2016  .  Vocal cord paralysis 01/31/2016  . Hypercalciuria 01/10/2016  . Cellulitis 11/28/2015  . Cellulitis of trunk 11/27/2015  . Nonspecific abnormal finding in stool contents   . Colon cancer screening   . Venous stasis   . History of DVT (deep vein thrombosis)   . Sepsis ([place] 10/09/2015  . Panniculitis 10/09/2015  . CLL (chronic lymphocytic leukemia) ([place] 05/27/2015  . 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 Calculus of kidney 10/27/2014  . Leukocytosis (leucocytosis) 08/29/2014  . HNP (herniated nucleus pulposus), cervical 01/21/2013  . bilat hip replacement 04/08/2012  . Morbid obesity ([facility] 04/08/2012  . Edema 04/08/2012   Past Medical History:  Diagnosis Date  . Anemia   . Arthritis   . BMI 50.0-59.9, adult ([facility]   . Cellulitis 11/2015   trunk  . cll 11/2014   CLL - 11/30/2014[place] [facility] [place] . D[place]deep [place] thrombosis) ([place] 6/16   Rt calf  took xarelto for 6 months  . Family history of anesthesia complication    " father having delirium after anesthesia"  . Heart murmur    Hx; of as a child  . History of kidney stones    x2  . Joint pain    Hx: of periodically  . Numbness and tingling    Hx: of in right arm  . Pneumonia   . Sleep apnea    Cpap use- settings 10   Past Surgical History:  Procedure  Laterality Date  . BACK SURGERY    . COLONOSCOPY     Hx: of  . COLONOSCOPY N/A 11/23/2015   Procedure: COLONOSCOPY;  Surgeon: Irene Shipper, MD;  Location: [facility];  Service: Endoscopy;  Laterality: N/A;  . INGUINAL HERNIA REPAIR Left 06/01/2015   Procedure: LEFT INGUINAL HERNIA REPAIR WITH MESH;  Surgeon: Autumn Messing III, MD;  Location: [facility];  Service: General;  Laterality: Left;  . INGUINAL HERNIA REPAIR    . INSERTION OF MESH Left 06/01/2015   Procedure: INSERTION OF MESH;  Surgeon: Autumn Messing III, MD;  Location: [facility];  Service: General;  Laterality: Left;  . JOINT REPLACEMENT Bilateral    BTHA  . KNEE ARTHROSCOPY Left 07/21/2014   Dr.  Noemi Chapel  . LUMBAR LAMINECTOMY  2005  . POSTERIOR CERVICAL LAMINECTOMY WITH MET- RX Right 01/21/2013   Procedure: Right Sitting C6-7 Microdiskectomy with Met-rex;  Surgeon: Kristeen Miss, MD;  Location: [place] [facility];  Service: Neurosurgery;  Laterality: Right;  Right Sitting C6-7 Microdiskectomy with Met-rex  . STENT PLACEMENT RT URETER ([place] HX)  10/23/2014  . tenosynovitis  2000   Hx: of thumb  . TONSILLECTOMY    . TOTAL HIP ARTHROPLASTY Bilateral   . WISDOM TOOTH EXTRACTION  1974   all 4   Allergies  Allergen Reactions  . Adhesive [Tape] Other (See Comments)    Blisters and rash at site   Prior to Admission medications   Medication Sig Start Date End Date Taking? Authorizing Provider  aspirin EC 81 MG tablet Take 81 mg by mouth daily.   Yes [provider]  ferrous sulfate (IRON SUPPLEMENT) 325 (65 FE) MG tablet Take 325 mg by mouth daily with breakfast.   Yes [provider]  fluticasone (FLONASE) 50 MCG/ACT nasal spray SPRAY 2 SPRAYS INTO EACH NOSTRIL EVERY DAY 10/25/18  Yes [name], MD  furosemide (LASIX) 80 MG tablet TAKE 1 TABLET BY MOUTH TWICE A DAY 12/18/18  Yes [name], MD  ibuprofen (ADVIL,MOTRIN) 200 MG tablet Take 600 mg by mouth every 6 (six) hours as needed for mild pain.   Yes [provider]  KLOR-CON M20 20 MEQ tablet TAKE 1 TABLET BY MOUTH TWICE A DAY 12/18/18  Yes [name], MD  metFORMIN (GLUCOPHAGE) 1000 MG tablet Take 1 tablet (1,000 mg total) by mouth 2 (two) times daily with a meal. 08/28/18  Yes [name], MD  Multiple Vitamins-Minerals (MULTIVITAMIN WITH MINERALS) tablet Take 1 tablet by mouth daily.   Yes [provider]   Social History   Socioeconomic History  . Marital status: Divorced    Spouse name: Not on file  . Number of children: 0  . Years of education: Not on file  . Highest education level: Not on file  Occupational History  . Occupation: Geophysical data processor:  [employer]  . Financial resource strain: Not on file  . Food insecurity    Worry: Not on file    Inability: Not on file  . Transportation needs    Medical: Not on file    Non-medical: Not on file  Tobacco Use  . Smoking status: Never Smoker  . Smokeless tobacco: Former Systems developer    Types: Chew  Substance and Sexual Activity  . Alcohol use: Yes    Alcohol/week: 1.0 standard drinks    Types: 1 Standard drinks or equivalent per week    Comment: BEER AND LIQUOR  .  Drug use: No  . Sexual activity: Not Currently  Lifestyle  . Physical activity    Days per week: Not on file    Minutes per session: Not on file  . Stress: Not on file  Relationships  . Social Herbalist on phone: Not on file    Gets together: Not on file    Attends religious service: Not on file    Active member of club or organization: Not on file    Attends meetings of clubs or organizations: Not on file    Relationship status: Not on file  . Intimate partner violence    Fear of current or ex partner: Not on file    Emotionally abused: Not on file    Physically abused: Not on file    Forced sexual activity: Not on file  Other Topics Concern  . Not on file  Social History Narrative   Exercise swimming 3-5 times/week for 45 minutes    Review of Systems  Constitutional: Negative for fatigue and unexpected weight change.  Eyes: Negative for visual disturbance.  Respiratory: Negative for cough, chest tightness and shortness of breath.   Cardiovascular: Positive for leg swelling (improving. ). Negative for chest pain and palpitations.  Gastrointestinal: Negative for abdominal pain and blood in stool.  Neurological: Negative for dizziness, light-headedness and headaches.       Objective:   Physical Exam Vitals signs reviewed.  Constitutional:      General: He is not in acute distress.    Appearance: He is well-developed. He is obese. He is not diaphoretic.  HENT:     Head:  Normocephalic and atraumatic.  Eyes:     Pupils: Pupils are equal, round, and reactive to light.  Neck:     Vascular: No carotid bruit or JVD.  Cardiovascular:     Rate and Rhythm: Normal rate and regular rhythm.     Heart sounds: Normal heart sounds. No murmur.  Pulmonary:     Effort: Pulmonary effort is normal.     Breath sounds: Normal breath sounds. No rales.  Musculoskeletal:     Right lower leg: Edema present.     Left lower leg: Edema (trace at ankles bilaterally. stasis changes, no wounds. ) present.  Skin:    General: Skin is warm and dry.  Neurological:     Mental Status: He is alert and oriented to person, place, and time.    Vitals:   12/25/18 1030  BP: 135/80  Pulse: 80  Resp: 18  Temp: 98.3 F (36.8 C)  TempSrc: Oral  SpO2: 96%  Weight: (!) 380 lb 12.8 oz (172.7 kg)      Assessment & Plan:  Momen Ham is a 58 y.o. male Type 2 diabetes mellitus with hyperglycemia, without long-term current use of insulin ([place]) - Plan: Hemoglobin A1c  Hyperlipidemia, unspecified hyperlipidemia type - Plan: Comprehensive metabolic panel, Lipid panel  BMI 50.0-59.9, adult ([facility])   Commended on weight loss. Other options discussed as well.  Expect improvement in A1c, but consider GLP1 if persistent A1c elevation. Continue metformin same dose for now.  Discussed statin recommendation with diabetes - declined at present. Will check lipids to review need as well.    No orders of the defined types were placed in this encounter.  Patient Instructions     Keep up the impressive work with weight loss.  Let me know if you would like to meet with nutritionist or can follow-up with medical bariatric  specialist if needed as well.   Continue metformin same dose for now, but if A1c still significantly elevated I would consider an injectable GLP-1 and can discuss that further once I see those results.    Additionally there is recommendation to be on a statin regardless of your  LDL but will check that level today.  Let me know if you would consider a low-dose statin even with intermittent dosing.  Recheck in 3 months.  Thanks for coming in today.    If you have lab work done today you will be contacted with your lab results within the next 2 weeks.  If you have not heard from [place] then please contact us. The fastest way to get your results is to register for My Chart.   IF you received an x-ray today, you will receive an invoice from [facility]. Please contact [facility] at [phone] with questions or concerns regarding your invoice.   IF you received labwork today, you will receive an invoice from [facility]. Please contact [facility] at [phone] with questions or concerns regarding your invoice.   Our billing staff will not be able to assist you with questions regarding bills from these companies.  You will be contacted with the lab results as soon as they are available. The fastest way to get your results is to activate your My Chart account. Instructions are located on the last page of this paperwork. If you have not heard from [place] regarding the results in 2 weeks, please contact this office.       Signed,   [name], MD [facility].  12/25/18 1:06 PM

## 2018-12-25 NOTE — Patient Instructions (Addendum)
   Keep up the impressive work with weight loss.  Let me know if you would like to meet with nutritionist or can follow-up with medical bariatric specialist if needed as well.   Continue metformin same dose for now, but if A1c still significantly elevated I would consider an injectable GLP-1 and can discuss that further once I see those results.    Additionally there is recommendation to be on a statin regardless of your LDL but will check that level today.  Let me know if you would consider a low-dose statin even with intermittent dosing.  Recheck in 3 months.  Thanks for coming in today.    If you have lab work done today you will be contacted with your lab results within the next 2 weeks.  If you have not heard from [place] then please contact us. The fastest way to get your results is to register for My Chart.   IF you received an x-ray today, you will receive an invoice from [facility]. Please contact [facility] at [phone] with questions or concerns regarding your invoice.   IF you received labwork today, you will receive an invoice from [facility]. Please contact [facility] at [phone] with questions or concerns regarding your invoice.   Our billing staff will not be able to assist you with questions regarding bills from these companies.  You will be contacted with the lab results as soon as they are available. The fastest way to get your results is to activate your My Chart account. Instructions are located on the last page of this paperwork. If you have not heard from [place] regarding the results in 2 weeks, please contact this office.

## 2018-12-26 LAB — LIPID PANEL
Chol/HDL Ratio: 4.2 ratio (ref 0.0–5.0)
Cholesterol, Total: 218 mg/dL — ABNORMAL HIGH (ref 100–199)
HDL: 52 mg/dL (ref >39)
LDL Calculated: 135 mg/dL — ABNORMAL HIGH (ref 0–99)
Triglycerides: 154 mg/dL — ABNORMAL HIGH (ref 0–149)
VLDL Cholesterol Cal: 31 mg/dL (ref 5–40)

## 2018-12-26 LAB — COMPREHENSIVE METABOLIC PANEL
ALT: 36 IU/L (ref 0–44)
AST: 27 IU/L (ref 0–40)
Albumin/Globulin Ratio: 1.7 (ref 1.2–2.2)
Albumin: 4.3 g/dL (ref 3.8–4.9)
Alkaline Phosphatase: 127 IU/L — ABNORMAL HIGH (ref 39–117)
BUN/Creatinine Ratio: 25 — ABNORMAL HIGH (ref 9–20)
BUN: 19 mg/dL (ref 6–24)
Bilirubin Total: 0.3 mg/dL (ref 0.0–1.2)
CO2: 24 mmol/L (ref 20–29)
Calcium: 8.6 mg/dL — ABNORMAL LOW (ref 8.7–10.2)
Chloride: 96 mmol/L (ref 96–106)
Creatinine, Ser: 0.76 mg/dL (ref 0.76–1.27)
GFR calc Af Amer: 116 mL/min/{1.73_m2} (ref >59)
GFR calc non Af Amer: 101 mL/min/{1.73_m2} (ref >59)
Globulin, Total: 2.5 g/dL (ref 1.5–4.5)
Glucose: 156 mg/dL — ABNORMAL HIGH (ref 65–99)
Potassium: 4.7 mmol/L (ref 3.5–5.2)
Sodium: 139 mmol/L (ref 134–144)
Total Protein: 6.8 g/dL (ref 6.0–8.5)

## 2018-12-26 LAB — HEMOGLOBIN A1C
Est. average glucose Bld gHb Est-mCnc: 192 mg/dL
Hgb A1c MFr Bld: 8.3 % — ABNORMAL HIGH (ref 4.8–5.6)

## 2019-01-11 ENCOUNTER — Other Ambulatory Visit: Payer: Self-pay | Admitting: Family Medicine

## 2019-01-11 DIAGNOSIS — R609 Edema, unspecified: Secondary | ICD-10-CM

## 2019-02-06 ENCOUNTER — Other Ambulatory Visit: Payer: Self-pay | Admitting: Family Medicine

## 2019-02-06 DIAGNOSIS — R609 Edema, unspecified: Secondary | ICD-10-CM

## 2019-02-20 ENCOUNTER — Other Ambulatory Visit: Payer: Self-pay | Admitting: Family Medicine

## 2019-02-20 NOTE — Telephone Encounter (Signed)
Forwarding medication refill request to the clinical pool for review. 

## 2019-02-24 LAB — HM DIABETES EYE EXAM

## 2019-02-28 ENCOUNTER — Encounter: Payer: Self-pay | Admitting: Family Medicine

## 2019-03-11 ENCOUNTER — Other Ambulatory Visit: Payer: Self-pay | Admitting: Family Medicine

## 2019-03-11 DIAGNOSIS — R609 Edema, unspecified: Secondary | ICD-10-CM

## 2019-03-20 ENCOUNTER — Encounter: Payer: Self-pay | Admitting: Family Medicine

## 2019-03-20 ENCOUNTER — Other Ambulatory Visit: Payer: Self-pay

## 2019-03-20 ENCOUNTER — Ambulatory Visit (INDEPENDENT_AMBULATORY_CARE_PROVIDER_SITE_OTHER): Payer: BC Managed Care – PPO | Admitting: Family Medicine

## 2019-03-20 VITALS — BP 130/80 | HR 81 | Temp 98.7°F | Wt 378.2 lb

## 2019-03-20 DIAGNOSIS — Z8042 Family history of malignant neoplasm of prostate: Secondary | ICD-10-CM

## 2019-03-20 DIAGNOSIS — Z0001 Encounter for general adult medical examination with abnormal findings: Secondary | ICD-10-CM

## 2019-03-20 DIAGNOSIS — E785 Hyperlipidemia, unspecified: Secondary | ICD-10-CM | POA: Diagnosis not present

## 2019-03-20 DIAGNOSIS — Z23 Encounter for immunization: Secondary | ICD-10-CM

## 2019-03-20 DIAGNOSIS — R609 Edema, unspecified: Secondary | ICD-10-CM

## 2019-03-20 DIAGNOSIS — E1165 Type 2 diabetes mellitus with hyperglycemia: Secondary | ICD-10-CM

## 2019-03-20 DIAGNOSIS — Z6841 Body Mass Index (BMI) 40.0 and over, adult: Secondary | ICD-10-CM

## 2019-03-20 DIAGNOSIS — Z0000 Encounter for general adult medical examination without abnormal findings: Secondary | ICD-10-CM

## 2019-03-20 DIAGNOSIS — Z125 Encounter for screening for malignant neoplasm of prostate: Secondary | ICD-10-CM

## 2019-03-20 MED ORDER — POTASSIUM CHLORIDE CRYS ER 20 MEQ PO TBCR: 20.0000 meq | EXTENDED_RELEASE_TABLET | Freq: Two times a day (BID) | ORAL | 1 refills | Status: DC

## 2019-03-20 MED ORDER — FUROSEMIDE 80 MG PO TABS: 80.0000 mg | ORAL_TABLET | Freq: Two times a day (BID) | ORAL | 1 refills | Status: DC

## 2019-03-20 NOTE — Patient Instructions (Addendum)
Keep follow-up with urology as planned but I will check PSA today as well as other lab work.  No med changes for now.  I do recommend a statin even if low-dose statin once a week.  Let me know if there is something you are willing to try.  Depending on A1c can also discuss change in diabetes regimen if needed.  Keep up the good work with swimming/exercise.   Recheck 3 months.  Keeping you healthy  Get these tests  Blood pressure- Have your blood pressure checked once a year by your healthcare provider.  Normal blood pressure is 120/80  Weight- Have your body mass index (BMI) calculated to screen for obesity.  BMI is a measure of body fat based on height and weight. You can also calculate your own BMI at ViewBanking.[email or web address].  Cholesterol- Have your cholesterol checked every year.  Diabetes- Have your blood sugar checked regularly if you have high blood pressure, high cholesterol, have a family history of diabetes or if you are overweight.  Screening for Colon Cancer- Colonoscopy starting at age 69.  Screening may begin sooner depending on your family history and other health conditions. Follow up colonoscopy as directed by your Gastroenterologist.  Screening for Prostate Cancer- Both blood work (PSA) and a rectal exam help screen for Prostate Cancer.  Screening begins at age 7 with African-American men and at age 37 with Caucasian men.  Screening may begin sooner depending on your family history.  Take these medicines  Aspirin- One aspirin daily can help prevent Heart disease and Stroke.  Flu shot- Every fall.  Tetanus- Every 10 years.  Zostavax- Once after the age of 69 to prevent Shingles.  Pneumonia shot- Once after the age of 47; if you are younger than 94, ask your healthcare provider if you need a Pneumonia shot.  Take these steps  Don't smoke- If you do smoke, talk to your doctor about quitting.  For tips on how to quit, go to www.smokefree.gov or call  1-800-QUIT-NOW.  Be physically active- Exercise 5 days a week for at least 30 minutes.  If you are not already physically active start slow and gradually work up to 30 minutes of moderate physical activity.  Examples of moderate activity include walking briskly, mowing the yard, dancing, swimming, bicycling, etc.  Eat a healthy diet- Eat a variety of healthy food such as fruits, vegetables, low fat milk, low fat cheese, yogurt, lean meant, poultry, fish, beans, tofu, etc. For more information go to www.thenutritionsource.org  Drink alcohol in moderation- Limit alcohol intake to less than two drinks a day. Never drink and drive.  Dentist- Brush and floss twice daily; visit your dentist twice a year.  Depression- Your emotional health is as important as your physical health. If you're feeling down, or losing interest in things you would normally enjoy please talk to your healthcare provider.  Eye exam- Visit your eye doctor every year.  Safe sex- If you may be exposed to a sexually transmitted infection, use a condom.  Seat belts- Seat belts can save your life; always wear one.  Smoke/Carbon Monoxide detectors- These detectors need to be installed on the appropriate level of your home.  Replace batteries at least once a year.  Skin cancer- When out in the sun, cover up and use sunscreen 15 SPF or higher.  Violence- If anyone is threatening you, please tell your healthcare provider.  Living Will/ Health care power of attorney- Speak with your healthcare provider and family.  If you have lab work done today you will be contacted with your lab results within the next 2 weeks.  If you have not heard from [place] then please contact us. The fastest way to get your results is to register for My Chart.   IF you received an x-ray today, you will receive an invoice from [facility]. Please contact [facility] at [phone] with questions or concerns regarding your invoice.   IF you  received labwork today, you will receive an invoice from [facility]. Please contact [facility] at [phone] with questions or concerns regarding your invoice.   Our billing staff will not be able to assist you with questions regarding bills from these companies.  You will be contacted with the lab results as soon as they are available. The fastest way to get your results is to activate your My Chart account. Instructions are located on the last page of this paperwork. If you have not heard from [place] regarding the results in 2 weeks, please contact this office.

## 2019-03-20 NOTE — Progress Notes (Signed)
Subjective:  Patient ID: Henry Hinton, male    DOB: Jul 05, 1960  Age: 58 y.o. MRN: [id number]  CC:  Chief Complaint  Patient presents with  . Annual Exam    annual exam with no other issues. Seen eye doc this year.    HPI Henry Hinton presents for  Annual physical exam.  History of obesity, chronic lymphocytic leukemia, diabetes, prior recurrent abdominal wall cellulitis, hypertension, obstructive sleep apnea, BPH, nephrolithiasis, peripheral edema.  Less stress being retired.  Plan to talk to nutritionist first of the year.   Chronic lymphocytic leukemia Followed by Dr. [name] in [facility].  Appointment September 23.  White blood cell count actually dropped somewhat, platelet and hemoglobin stable.  No B symptoms, no adenopathy, continuing to monitor closely.   Diabetes: Complicated by hyperglycemia/obesity.  Weight was improving at August 26 visit.  Was continued on Metformin at that time. Recommended statin, GLP1. He declined both. Aware of increased risk of HLD and diabetes.  Down 2 more pounds.  Microalbumin: normal ratio 5/29.  Optho, foot exam, pneumovax: up to date.   Lab Results  Component Value Date   HGBA1C 8.3 (H) 12/25/2018   HGBA1C 9.1 (A) 09/24/2018   HGBA1C 8.6 (H) 06/26/2018   Lab Results  Component Value Date   MICROALBUR 0.7 02/24/2016   LDLCALC 135 (H) 12/25/2018   CREATININE 0.76 12/25/2018   Wt Readings from Last 3 Encounters:  03/20/19 (!) 378 lb 3.2 oz (171.6 kg)  12/25/18 (!) 380 lb 12.8 oz (172.7 kg)  09/27/18 (!) 404 lb (183.3 kg)   Peripheral edema Takes Lasix 80 mg twice daily, potassium 20 mEq twice daily.  Has been evaluated by nephrology, appointment in February. Swelling has been managed well recently.   Obesity Weight decreasing as above with increased exercise and improving diet adherence.  Gastric sleeve/gastric bypass has been discussed in the past if not significant improvement within a year when discussed by nephrology  in February. Swimming daily. Plan for assessment of weight in June 2021 to decide if surgical option needed. Plan for 325 in 1.5 yrs. Feels better with weight he has lost. Does not feel bloated. Has changed diet.   Cancer screening Colonoscopy 11/23/2015, repeat 10 years Prostate: Family history of prostate cancer.  PSA 0.4 in November 2019. Requests testing, but will be seeing his urologist in 1 month. Plan on exam there. PSA drawn today.   Immunization History  Administered Date(s) Administered  . Influenza,inj,Quad PF,6+ Mos 02/27/2017, 03/04/2018, 03/20/2019  . Pneumococcal Polysaccharide-23 03/04/2018  . Tdap 07/31/2006, 02/27/2017  Shingles: has not had. Deferred for now.    Depression screen [facility] 2/9 12/25/2018 09/27/2018 09/24/2018 06/26/2018 04/29/2018  Decreased Interest 0 0 0 0 0  Down, Depressed, Hopeless 0 0 0 - 0  PHQ - 2 Score 0 0 0 0 0    Hearing Screening   '125Hz'  '250Hz'  '500Hz'  '1000Hz'  '2000Hz'  '3000Hz'  '4000Hz'  '6000Hz'  '8000Hz'   Right ear:           Left ear:             Visual Acuity Screening   Right eye Left eye Both eyes  Without correction: '20/50 20/50 20/30 '  With correction:     optho eval in past few months.   Dental: every 6 months.   Exercise: swimming daily. No CP with exertion.    History Patient Active Problem List   Diagnosis Date Noted  . BPH without obstruction/lower urinary tract symptoms 03/14/2018  . Family  history of prostate cancer 03/14/2018  . History of nephrolithiasis 03/14/2018  . Microscopic hematuria 03/14/2018  . Diarrhea 11/20/2017  . Diabetes mellitus without complication ([place]) 45/62/5638  . Iron deficiency anemia 11/19/2017  . Influenza, pneumonia 09/22/2017  . Abdominal wall cellulitis 01/22/2017  . Insect bite 01/22/2017  . Hyperkalemia 01/22/2017  . OSA (obstructive sleep apnea) 01/22/2017  . Essential hypertension   . Hoarseness 06/08/2016  . Vocal cord paralysis 01/31/2016  . Hypercalciuria 01/10/2016  . Cellulitis 11/28/2015   . Cellulitis of trunk 11/27/2015  . Nonspecific abnormal finding in stool contents   . Colon cancer screening   . Venous stasis   . History of DVT (deep vein thrombosis)   . Sepsis ([place]) 10/09/2015  . Panniculitis 10/09/2015  . CLL (chronic lymphocytic leukemia) ([place]) 05/27/2015  . 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 Calculus of kidney 10/27/2014  . Leukocytosis (leucocytosis) 08/29/2014  . HNP (herniated nucleus pulposus), cervical 01/21/2013  . bilat hip replacement 04/08/2012  . Morbid obesity ([place]) 04/08/2012  . Edema 04/08/2012   Past Medical History:  Diagnosis Date  . Anemia   . Arthritis   . BMI 50.0-59.9, adult ([place])   . Cellulitis 11/2015   trunk  . cll 11/2014   CLL - 11/30/2014- Dr. [facility] [place], [place]  . DVT (deep venous thrombosis) ([place]) 6/16   Rt calf  took xarelto for 6 months  . Family history of anesthesia complication    " father having delirium after anesthesia"  . Heart murmur    Hx; of as a child  . History of kidney stones    x2  . Joint pain    Hx: of periodically  . Numbness and tingling    Hx: of in right arm  . Pneumonia   . Sleep apnea    Cpap use- settings 10   Past Surgical History:  Procedure Laterality Date  . BACK SURGERY    . COLONOSCOPY     Hx: of  . COLONOSCOPY N/A 11/23/2015   Procedure: COLONOSCOPY;  Surgeon: Irene Shipper, MD;  Location: [facility];  Service: Endoscopy;  Laterality: N/A;  . INGUINAL HERNIA REPAIR Left 06/01/2015   Procedure: LEFT INGUINAL HERNIA REPAIR WITH MESH;  Surgeon: Autumn Messing III, MD;  Location: [facility];  Service: General;  Laterality: Left;  . INGUINAL HERNIA REPAIR    . INSERTION OF MESH Left 06/01/2015   Procedure: INSERTION OF MESH;  Surgeon: Autumn Messing III, MD;  Location: [facility];  Service: General;  Laterality: Left;  . JOINT REPLACEMENT Bilateral    BTHA  . KNEE ARTHROSCOPY Left 07/21/2014   Dr. Noemi Chapel  . LUMBAR LAMINECTOMY  2005  . POSTERIOR CERVICAL LAMINECTOMY WITH MET- RX Right  01/21/2013   Procedure: Right Sitting C6-7 Microdiskectomy with Met-rex;  Surgeon: Kristeen Miss, MD;  Location: [facility];  Service: Neurosurgery;  Laterality: Right;  Right Sitting C6-7 Microdiskectomy with Met-rex  . STENT PLACEMENT RT URETER ([place] [facility])  10/23/2014  . tenosynovitis  2000   Hx: of thumb  . TONSILLECTOMY    . TOTAL HIP ARTHROPLASTY Bilateral   . WISDOM TOOTH EXTRACTION  1974   all 4   Allergies  Allergen Reactions  . Adhesive [Tape] Other (See Comments)    Blisters and rash at site   Prior to Admission medications   Medication Sig Start Date End Date Taking? Authorizing Provider  aspirin EC 81 MG tablet Take 81 mg by mouth daily.   Yes [provider]  ferrous sulfate (IRON SUPPLEMENT) 325 (65 FE) MG tablet Take 325 mg by mouth daily with breakfast.   Yes [provider]  fluticasone (FLONASE) 50 MCG/ACT nasal spray SPRAY 2 SPRAYS INTO EACH NOSTRIL EVERY DAY 10/25/18  Yes [name], MD  furosemide (LASIX) 80 MG tablet TAKE 1 TABLET BY MOUTH TWICE A DAY 03/11/19  Yes [name], MD  ibuprofen (ADVIL,MOTRIN) 200 MG tablet Take 600 mg by mouth every 6 (six) hours as needed for mild pain.   Yes [provider]  KLOR-CON M20 20 MEQ tablet TAKE 1 TABLET BY MOUTH TWICE A DAY 03/11/19  Yes [name], MD  metFORMIN (GLUCOPHAGE) 1000 MG tablet TAKE 1 TABLET BY MOUTH 2 TIMES DAILY WITH A MEAL. 02/21/19  Yes [name], MD  Multiple Vitamins-Minerals (MULTIVITAMIN WITH MINERALS) tablet Take 1 tablet by mouth daily.   Yes [provider]   Social History   Socioeconomic History  . Marital status: Divorced    Spouse name: Not on file  . Number of children: 0  . Years of education: Not on file  . Highest education level: Not on file  Occupational History  . Occupation: Geophysical data processor: [employer]  . Financial resource strain: Not on file  . Food insecurity     Worry: Not on file    Inability: Not on file  . Transportation needs    Medical: Not on file    Non-medical: Not on file  Tobacco Use  . Smoking status: Never Smoker  . Smokeless tobacco: Former Systems developer    Types: Chew  Substance and Sexual Activity  . Alcohol use: Yes    Alcohol/week: 1.0 standard drinks    Types: 1 Standard drinks or equivalent per week    Comment: BEER AND LIQUOR  . Drug use: No  . Sexual activity: Not Currently  Lifestyle  . Physical activity    Days per week: Not on file    Minutes per session: Not on file  . Stress: Not on file  Relationships  . Social Herbalist on phone: Not on file    Gets together: Not on file    Attends religious service: Not on file    Active member of club or organization: Not on file    Attends meetings of clubs or organizations: Not on file    Relationship status: Not on file  . Intimate partner violence    Fear of current or ex partner: Not on file    Emotionally abused: Not on file    Physically abused: Not on file    Forced sexual activity: Not on file  Other Topics Concern  . Not on file  Social History Narrative   Exercise swimming 3-5 times/week for 45 minutes    Review of Systems 13 point review of systems per patient health survey noted.  Negative other than as indicated above or in HPI.    Objective:   Vitals:   03/20/19 0834  BP: 130/80  Pulse: 81  Temp: 98.7 F (37.1 C)  TempSrc: Oral  SpO2: 98%  Weight: (!) 378 lb 3.2 oz (171.6 kg)     Physical Exam Vitals signs reviewed.  Constitutional:      Appearance: He is well-developed. He is obese.  HENT:     Head: Normocephalic and atraumatic.     Right Ear: External ear normal.     Left Ear: External ear  normal.  Eyes:     Conjunctiva/sclera: Conjunctivae normal.     Pupils: Pupils are equal, round, and reactive to light.  Neck:     Musculoskeletal: Normal range of motion and neck supple.     Thyroid: No thyromegaly.  Cardiovascular:      Rate and Rhythm: Normal rate and regular rhythm.     Heart sounds: Normal heart sounds.  Pulmonary:     Effort: Pulmonary effort is normal. No respiratory distress.     Breath sounds: Normal breath sounds. No wheezing.  Abdominal:     General: There is no distension.     Palpations: Abdomen is soft.     Tenderness: There is no abdominal tenderness.  Musculoskeletal: Normal range of motion.        General: No tenderness.  Lymphadenopathy:     Cervical: No cervical adenopathy.  Skin:    General: Skin is warm and dry.     Comments: Stasis changes lower legs but no wounds/ulceration.  Skin soft,no appreciable edema.  Neurological:     Mental Status: He is alert and oriented to person, place, and time.     Deep Tendon Reflexes: Reflexes are normal and symmetric.  Psychiatric:        Behavior: Behavior normal.        Assessment & Plan:  Henry Hinton is a 58 y.o. male . Annual physical exam  - -anticipatory guidance as below in AVS, screening labs above. Health maintenance items as above in HPI discussed/recommended as applicable.   Need for prophylactic vaccination and inoculation against influenza - Plan: Flu Vaccine QUAD 6+ mos PF IM (Fluarix Quad PF)  Peripheral edema - Plan: furosemide (LASIX) 80 MG tablet, potassium chloride SA (KLOR-CON M20) 20 MEQ tablet  -  Stable, tolerating current regimen. Medications refilled. Labs pending as above.   Type 2 diabetes mellitus with hyperglycemia, without long-term current use of insulin (HCC) - Plan: Hemoglobin A1c, Comprehensive metabolic panel  - check S2G, consider GLP1 if elevated. contineu metformin for now.   -Commended on continued exercise and weight loss.  Given degree of obesity still would consider gastric bypass/surgery if not significantly losing weight into next year.  Time schedule plan as above.  Also consider meeting with nutritionist.  Hyperlipidemia, unspecified hyperlipidemia type - Plan: Lipid Panel  -Statin  discussed given history of diabetes, even low-dose statin or once per week.  Declined at this time.  Waiting on labs to decide further.  Screening for malignant neoplasm of prostate - Plan: PSA Family history of prostate cancer - Plan: PSA  - We discussed pros and cons of prostate cancer screening, and after this discussion, he chose to have screening done. PSA obtained, with plan of DRE at his urology appointment  No orders of the defined types were placed in this encounter.  Patient Instructions     Keep follow-up with urology as planned but I will check PSA today as well as other lab work.  No med changes for now.  I do recommend a statin even if low-dose statin once a week.  Let me know if there is something you are willing to try.  Depending on A1c can also discuss change in diabetes regimen if needed.  Keep up the good work with swimming/exercise.   Recheck 3 months.  Keeping you healthy  Get these tests  Blood pressure- Have your blood pressure checked once a year by your healthcare provider.  Normal blood pressure is 120/80  Weight- Have  your body mass index (BMI) calculated to screen for obesity.  BMI is a measure of body fat based on height and weight. You can also calculate your own BMI at ViewBanking.si.  Cholesterol- Have your cholesterol checked every year.  Diabetes- Have your blood sugar checked regularly if you have high blood pressure, high cholesterol, have a family history of diabetes or if you are overweight.  Screening for Colon Cancer- Colonoscopy starting at age 63.  Screening may begin sooner depending on your family history and other health conditions. Follow up colonoscopy as directed by your Gastroenterologist.  Screening for Prostate Cancer- Both blood work (PSA) and a rectal exam help screen for Prostate Cancer.  Screening begins at age 7 with African-American men and at age 1 with Caucasian men.  Screening may begin sooner depending on your  family history.  Take these medicines  Aspirin- One aspirin daily can help prevent Heart disease and Stroke.  Flu shot- Every fall.  Tetanus- Every 10 years.  Zostavax- Once after the age of 69 to prevent Shingles.  Pneumonia shot- Once after the age of 72; if you are younger than 20, ask your healthcare provider if you need a Pneumonia shot.  Take these steps  Don't smoke- If you do smoke, talk to your doctor about quitting.  For tips on how to quit, go to www.smokefree.gov or call 1-800-QUIT-NOW.  Be physically active- Exercise 5 days a week for at least 30 minutes.  If you are not already physically active start slow and gradually work up to 30 minutes of moderate physical activity.  Examples of moderate activity include walking briskly, mowing the yard, dancing, swimming, bicycling, etc.  Eat a healthy diet- Eat a variety of healthy food such as fruits, vegetables, low fat milk, low fat cheese, yogurt, lean meant, poultry, fish, beans, tofu, etc. For more information go to www.thenutritionsource.org  Drink alcohol in moderation- Limit alcohol intake to less than two drinks a day. Never drink and drive.  Dentist- Brush and floss twice daily; visit your dentist twice a year.  Depression- Your emotional health is as important as your physical health. If you're feeling down, or losing interest in things you would normally enjoy please talk to your healthcare provider.  Eye exam- Visit your eye doctor every year.  Safe sex- If you may be exposed to a sexually transmitted infection, use a condom.  Seat belts- Seat belts can save your life; always wear one.  Smoke/Carbon Monoxide detectors- These detectors need to be installed on the appropriate level of your home.  Replace batteries at least once a year.  Skin cancer- When out in the sun, cover up and use sunscreen 15 SPF or higher.  Violence- If anyone is threatening you, please tell your healthcare provider.  Living Will/  Health care power of attorney- Speak with your healthcare provider and family.  If you have lab work done today you will be contacted with your lab results within the next 2 weeks.  If you have not heard from [place] then please contact us. The fastest way to get your results is to register for My Chart.   IF you received an x-ray today, you will receive an invoice from [facility]. Please contact [facility] at [phone] with questions or concerns regarding your invoice.   IF you received labwork today, you will receive an invoice from [facility]. Please contact [facility] at [phone] with questions or concerns regarding your invoice.   Our billing staff will not be  able to assist you with questions regarding bills from these companies.  You will be contacted with the lab results as soon as they are available. The fastest way to get your results is to activate your My Chart account. Instructions are located on the last page of this paperwork. If you have not heard from [place] regarding the results in 2 weeks, please contact this office.          Signed, [name], MD [facility]

## 2019-03-21 LAB — COMPREHENSIVE METABOLIC PANEL
ALT: 34 IU/L (ref 0–44)
AST: 30 IU/L (ref 0–40)
Albumin/Globulin Ratio: 1.6 (ref 1.2–2.2)
Albumin: 4.3 g/dL (ref 3.8–4.9)
Alkaline Phosphatase: 126 IU/L — ABNORMAL HIGH (ref 39–117)
BUN/Creatinine Ratio: 24 — ABNORMAL HIGH (ref 9–20)
BUN: 21 mg/dL (ref 6–24)
Bilirubin Total: 0.4 mg/dL (ref 0.0–1.2)
CO2: 22 mmol/L (ref 20–29)
Calcium: 8.7 mg/dL (ref 8.7–10.2)
Chloride: 102 mmol/L (ref 96–106)
Creatinine, Ser: 0.89 mg/dL (ref 0.76–1.27)
GFR calc Af Amer: 109 mL/min/{1.73_m2} (ref >59)
GFR calc non Af Amer: 94 mL/min/{1.73_m2} (ref >59)
Globulin, Total: 2.7 g/dL (ref 1.5–4.5)
Glucose: 168 mg/dL — ABNORMAL HIGH (ref 65–99)
Potassium: 4.8 mmol/L (ref 3.5–5.2)
Sodium: 141 mmol/L (ref 134–144)
Total Protein: 7 g/dL (ref 6.0–8.5)

## 2019-03-21 LAB — LIPID PANEL
Chol/HDL Ratio: 4.3 ratio (ref 0.0–5.0)
Cholesterol, Total: 204 mg/dL — ABNORMAL HIGH (ref 100–199)
HDL: 47 mg/dL (ref >39)
LDL Chol Calc (NIH): 131 mg/dL — ABNORMAL HIGH (ref 0–99)
Triglycerides: 146 mg/dL (ref 0–149)
VLDL Cholesterol Cal: 26 mg/dL (ref 5–40)

## 2019-03-21 LAB — PSA: Prostate Specific Ag, Serum: 0.3 ng/mL (ref 0.0–4.0)

## 2019-03-21 LAB — HEMOGLOBIN A1C
Est. average glucose Bld gHb Est-mCnc: 194 mg/dL
Hgb A1c MFr Bld: 8.4 % — ABNORMAL HIGH (ref 4.8–5.6)

## 2019-04-04 ENCOUNTER — Encounter: Payer: Self-pay | Admitting: Family Medicine

## 2019-04-08 ENCOUNTER — Other Ambulatory Visit: Payer: Self-pay | Admitting: Family Medicine

## 2019-04-08 DIAGNOSIS — E785 Hyperlipidemia, unspecified: Secondary | ICD-10-CM

## 2019-04-08 DIAGNOSIS — E1165 Type 2 diabetes mellitus with hyperglycemia: Secondary | ICD-10-CM

## 2019-04-08 MED ORDER — ATORVASTATIN CALCIUM 10 MG PO TABS: 10.0000 mg | ORAL_TABLET | Freq: Every day | ORAL | 0 refills | Status: DC

## 2019-04-08 MED ORDER — TRULICITY 0.75 MG/0.5ML ~~LOC~~ SOAJ: 0.7500 mg | SUBCUTANEOUS | 1 refills | Status: DC

## 2019-04-08 NOTE — Progress Notes (Signed)
See labs. Starting GLP and statin.

## 2019-04-17 ENCOUNTER — Encounter: Payer: Self-pay | Admitting: Family Medicine

## 2019-05-12 ENCOUNTER — Encounter: Payer: Self-pay | Admitting: Family Medicine

## 2019-05-14 ENCOUNTER — Telehealth: Payer: Self-pay | Admitting: Family Medicine

## 2019-05-14 NOTE — Telephone Encounter (Signed)
FYI

## 2019-05-14 NOTE — Telephone Encounter (Signed)
Tylenol, fluids, rest. Will send him message. With his medical history, I would like to evaluate him if possible with a telemed visit in next 2 days. Please schedule if ok with him.

## 2019-05-14 NOTE — Telephone Encounter (Signed)
Pt just found out he is positive for Covid 05/14/19. He has been experiencing coughing, fever, body aches with no taste or smell since Saturday. He feel his fever has broken, he has been taking tylenol. Pt is quarantining. He would like to know if there is anything else he can do? Please advise at  [phone].

## 2019-05-15 ENCOUNTER — Other Ambulatory Visit: Payer: Self-pay

## 2019-05-15 ENCOUNTER — Telehealth (INDEPENDENT_AMBULATORY_CARE_PROVIDER_SITE_OTHER): Payer: BC Managed Care – PPO | Admitting: Family Medicine

## 2019-05-15 VITALS — BP 136/86 | HR 66 | Temp 98.2°F | Ht 69.0 in | Wt 362.0 lb

## 2019-05-15 DIAGNOSIS — U071 COVID-19: Secondary | ICD-10-CM

## 2019-05-15 DIAGNOSIS — R5383 Other fatigue: Secondary | ICD-10-CM

## 2019-05-15 NOTE — Telephone Encounter (Signed)
Called pt LVM for him to c/b and schedule telemed with Dr. Arley Phenix

## 2019-05-15 NOTE — Progress Notes (Signed)
Virtual Visit via Video Note  I connected with Henry Hinton on 05/15/19 at 4:45 PM by a video enabled telemedicine application and verified that I am speaking with the correct person using two identifiers.   I discussed the limitations, risks, security and privacy concerns of performing an evaluation and management service by telephone and the availability of in person appointments. I also discussed with the patient that there may be a patient responsible charge related to this service. The patient expressed understanding and agreed to proceed, consent obtained  Chief complaint:  Covid 19 infection.   History of Present Illness: Henry Hinton is a 59 y.o. male  Initial symptoms 1/9 with cough, fever Sunday evening 1/10. Temp 100 that night. 3 days ago - HA. Bodyache.  No shortness of breath.  Loss of taste 2 days ago.  Diarrhea 2 days ago - resolved after 1 day. Occasional nausea, no vomiting.  Positive test at [facility] 1/11 [facility]. Received call positive result text yesterday.  tmax 102.1.  Lowest today at 98.7.  Fatigue a little better today. Able to walk some earlier today - not dyspneic.  No cough in past few days.  Able to drink fluids - water, gatorade.  Tx: tylenol 1065m every 6 hours. vitamin D3, vitamin C.   Unknown if any specific sick contacts.   Covid risk of complication score: 4.   Has been trying to self isolate. 791yo mom at home, some symptoms yesterday - her test is pending.   Patient Active Problem List   Diagnosis Date Noted  . BPH without obstruction/lower urinary tract symptoms 03/14/2018  . Family history of prostate cancer 03/14/2018  . History of nephrolithiasis 03/14/2018  . Microscopic hematuria 03/14/2018  . Diarrhea 11/20/2017  . Diabetes mellitus without complication ([place] 071/09/2692 . Iron deficiency anemia 11/19/2017  . Influenza, pneumonia 09/22/2017  . Abdominal wall cellulitis 01/22/2017  . Insect bite 01/22/2017  .  Hyperkalemia 01/22/2017  . OSA (obstructive sleep apnea) 01/22/2017  . Essential hypertension   . Hoarseness 06/08/2016  . Vocal cord paralysis 01/31/2016  . Hypercalciuria 01/10/2016  . Cellulitis 11/28/2015  . Cellulitis of trunk 11/27/2015  . Nonspecific abnormal finding in stool contents   . Colon cancer screening   . Venous stasis   . History of DVT (deep vein thrombosis)   . Sepsis ([place] 10/09/2015  . Panniculitis 10/09/2015  . CLL (chronic lymphocytic leukemia) ([place] 05/27/2015  . 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 Calculus of kidney 10/27/2014  . Leukocytosis (leucocytosis) 08/29/2014  . HNP (herniated nucleus pulposus), cervical 01/21/2013  . bilat hip replacement 04/08/2012  . Morbid obesity ([place] 04/08/2012  . Edema 04/08/2012   Past Medical History:  Diagnosis Date  . Anemia   . Arthritis   . BMI 50.0-59.9, adult ([place]   . Cellulitis 11/2015   trunk  . cll 11/2014   [facility] - [facility] [facility] [facility] [place] . DVT (deep venous thrombosis) ([place] 6/16   Rt calf  took xarelto for 6 months  . Family history of anesthesia complication    " father having delirium after anesthesia"  . Heart murmur    Hx; of as a child  . History of kidney stones    x2  . Joint pain    Hx: of periodically  . Numbness and tingling    Hx: of in right arm  . Pneumonia   . Sleep apnea    Cpap use- settings 10  Past Surgical History:  Procedure Laterality Date  . BACK SURGERY    . COLONOSCOPY     Hx: of  . COLONOSCOPY N/A 11/23/2015   Procedure: COLONOSCOPY;  Surgeon: Irene Shipper, MD;  [facility];  Service: Endoscopy;  Laterality: N/A;  . INGUINAL HERNIA REPAIR Left 06/01/2015   Procedure: LEFT INGUINAL HERNIA REPAIR WITH MESH;  Surgeon: Autumn Messing III, MD;  [facility] ORS;  Service: General;  Laterality: Left;  . INGUINAL HERNIA REPAIR    . INSERTION OF MESH Left 06/01/2015   Procedure: INSERTION OF MESH;  Surgeon: Autumn Messing III, MD;  [facility]  ORS;  Service: General;  Laterality: Left;  . JOINT REPLACEMENT Bilateral    BTHA  . KNEE ARTHROSCOPY Left 07/21/2014   Dr. Noemi Chapel  . LUMBAR LAMINECTOMY  2005  . POSTERIOR CERVICAL LAMINECTOMY WITH MET- RX Right 01/21/2013   Procedure: Right Sitting C6-7 Microdiskectomy with Met-rex;  Surgeon: Kristeen Miss, MD;  Location: [place] [facility];  Service: Neurosurgery;  Laterality: Right;  Right Sitting C6-7 Microdiskectomy with Met-rex  . STENT PLACEMENT RT URETER ([place] HX)  10/23/2014  . tenosynovitis  2000   Hx: of thumb  . TONSILLECTOMY    . TOTAL HIP ARTHROPLASTY Bilateral   . WISDOM TOOTH EXTRACTION  1974   all 4   Allergies  Allergen Reactions  . Adhesive [Tape] Other (See Comments)    Blisters and rash at site   Prior to Admission medications   Medication Sig Start Date End Date Taking? Authorizing Provider  Ascorbic Acid (VITAMIN C) 1000 MG tablet Take 1,000 mg by mouth daily.   Yes [provider]  aspirin EC 81 MG tablet Take 81 mg by mouth daily.   Yes [provider]  atorvastatin (LIPITOR) 10 MG tablet Take 1 tablet (10 mg total) by mouth daily. 04/08/19  Yes [name], MD  Dulaglutide (TRULICITY) 3.66 YQ/0.3KV SOPN Inject 0.75 mg into the skin once a week. 04/08/19  Yes [name], MD  ferrous sulfate (IRON SUPPLEMENT) 325 (65 FE) MG tablet Take 325 mg by mouth daily with breakfast.   Yes [provider]  fluticasone (FLONASE) 50 MCG/ACT nasal spray SPRAY 2 SPRAYS INTO EACH NOSTRIL EVERY DAY 10/25/18  Yes [name], MD  furosemide (LASIX) 80 MG tablet Take 1 tablet (80 mg total) by mouth 2 (two) times daily. 03/20/19  Yes [name], MD  metFORMIN (GLUCOPHAGE) 1000 MG tablet TAKE 1 TABLET BY MOUTH 2 TIMES DAILY WITH A MEAL. 02/21/19  Yes [name], MD  Multiple Vitamins-Minerals (MULTIVITAMIN WITH MINERALS) tablet Take 1 tablet by mouth daily.   Yes [provider]  potassium chloride SA (KLOR-CON M20) 20 MEQ  tablet Take 1 tablet (20 mEq total) by mouth 2 (two) times daily. 03/20/19  Yes [name], MD  ibuprofen (ADVIL,MOTRIN) 200 MG tablet Take 600 mg by mouth every 6 (six) hours as needed for mild pain.    [provider]   Social History   Socioeconomic History  . Marital status: Divorced    Spouse name: Not on file  . Number of children: 0  . Years of education: Not on file  . Highest education level: Not on file  Occupational History  . Occupation: Geophysical data processor: [employer]  Tobacco Use  . Smoking status: Never Smoker  . Smokeless tobacco: Former Systems developer    Types: Chew  Substance and Sexual Activity  .  Alcohol use: Yes    Alcohol/week: 1.0 standard drinks    Types: 1 Standard drinks or equivalent per week    Comment: BEER AND LIQUOR  . Drug use: No  . Sexual activity: Not Currently  Other Topics Concern  . Not on file  Social History Narrative   Exercise swimming 3-5 times/week for 45 minutes   Social Determinants of Health   Financial Resource Strain:   . Difficulty of Paying Living Expenses: Not on file  Food Insecurity:   . Worried About Charity fundraiser in the Last Year: Not on file  . Ran Out of Food in the Last Year: Not on file  Transportation Needs:   . Lack of Transportation (Medical): Not on file  . Lack of Transportation (Non-Medical): Not on file  Physical Activity:   . Days of Exercise per Week: Not on file  . Minutes of Exercise per Session: Not on file  Stress:   . Feeling of Stress : Not on file  Social Connections:   . Frequency of Communication with Friends and Family: Not on file  . Frequency of Social Gatherings with Friends and Family: Not on file  . Attends Religious Services: Not on file  . Active Member of Clubs or Organizations: Not on file  . Attends Archivist Meetings: Not on file  . Marital Status: Not on file  Intimate Partner Violence:   . Fear of Current or Ex-Partner:  Not on file  . Emotionally Abused: Not on file  . Physically Abused: Not on file  . Sexually Abused: Not on file    Observations/Objective: Home vitals.  Vitals:   05/15/19 1349  BP: 136/86  Pulse: 66  Temp: 98.2 F (36.8 C)  SpO2: 94%  Weight: (!) 362 lb (164.2 kg)  Height: '5\' 9"'  (1.753 m)  speaking in full sentences, with appropriate responses, no distress.  Nontoxic appearance on video  Assessment and Plan: COVID-19  Fatigue, unspecified type COVID-19 infection, day 6.  Some improvement in fatigue earlier today, denies dyspnea.  Vital signs reviewed including O2 sat as above.  Comorbidities of obesity, diabetes, chronic lymphocytic leukemia.  Appears to be tolerating outpatient treatment at this time.  ER precautions were given with understanding expressed.  Continue self-isolation.  Criteria for return to work were reviewed.  Follow Up Instructions: As needed.    I discussed the assessment and treatment plan with the patient. The patient was provided an opportunity to ask questions and all were answered. The patient agreed with the plan and demonstrated an understanding of the instructions.   The patient was advised to call back or seek an in-person evaluation if the symptoms worsen or if the condition fails to improve as anticipated.  I provided 14 minutes of non-face-to-face time during this encounter.   [name], MD

## 2019-05-15 NOTE — Patient Instructions (Signed)
Thanks for taking the video call today.  Glad to hear that symptoms appear to be improving today.  Continue rest, fluids, Tylenol, okay to continue vitamin C and vitamin D as well.  If any acute worsening including shortness of breath at rest, inability to drink fluids, confusion/disorientation, be seen in the emergency room.  Let me know how you are doing over the weekend.

## 2019-05-17 ENCOUNTER — Encounter: Payer: Self-pay | Admitting: Family Medicine

## 2019-05-18 ENCOUNTER — Other Ambulatory Visit: Payer: Self-pay

## 2019-05-18 ENCOUNTER — Inpatient Hospital Stay (HOSPITAL_COMMUNITY)
Admission: EM | Admit: 2019-05-18 | Discharge: 2019-05-22 | DRG: 177 | Disposition: A | Discharge status: Home / Self Care | Payer: BC Managed Care – PPO | Attending: Internal Medicine | Admitting: Internal Medicine

## 2019-05-18 ENCOUNTER — Encounter (HOSPITAL_COMMUNITY): Payer: Self-pay | Admitting: Internal Medicine

## 2019-05-18 ENCOUNTER — Encounter: Payer: Self-pay | Admitting: Family Medicine

## 2019-05-18 ENCOUNTER — Emergency Department (HOSPITAL_COMMUNITY): Payer: BC Managed Care – PPO

## 2019-05-18 DIAGNOSIS — U071 COVID-19: Secondary | ICD-10-CM | POA: Diagnosis present

## 2019-05-18 DIAGNOSIS — Z6841 Body Mass Index (BMI) 40.0 and over, adult: Secondary | ICD-10-CM | POA: Diagnosis not present

## 2019-05-18 DIAGNOSIS — E1165 Type 2 diabetes mellitus with hyperglycemia: Secondary | ICD-10-CM | POA: Diagnosis not present

## 2019-05-18 DIAGNOSIS — Z801 Family history of malignant neoplasm of trachea, bronchus and lung: Secondary | ICD-10-CM | POA: Diagnosis not present

## 2019-05-18 DIAGNOSIS — E119 Type 2 diabetes mellitus without complications: Secondary | ICD-10-CM

## 2019-05-18 DIAGNOSIS — J1282 Pneumonia due to coronavirus disease 2019: Secondary | ICD-10-CM | POA: Diagnosis present

## 2019-05-18 DIAGNOSIS — Z825 Family history of asthma and other chronic lower respiratory diseases: Secondary | ICD-10-CM

## 2019-05-18 DIAGNOSIS — C9110 Chronic lymphocytic leukemia of B-cell type not having achieved remission: Secondary | ICD-10-CM | POA: Diagnosis present

## 2019-05-18 DIAGNOSIS — T380X5A Adverse effect of glucocorticoids and synthetic analogues, initial encounter: Secondary | ICD-10-CM | POA: Diagnosis not present

## 2019-05-18 DIAGNOSIS — Z86718 Personal history of other venous thrombosis and embolism: Secondary | ICD-10-CM

## 2019-05-18 DIAGNOSIS — Z791 Long term (current) use of non-steroidal anti-inflammatories (NSAID): Secondary | ICD-10-CM | POA: Diagnosis not present

## 2019-05-18 DIAGNOSIS — M1990 Unspecified osteoarthritis, unspecified site: Secondary | ICD-10-CM | POA: Diagnosis present

## 2019-05-18 DIAGNOSIS — Z8249 Family history of ischemic heart disease and other diseases of the circulatory system: Secondary | ICD-10-CM

## 2019-05-18 DIAGNOSIS — Z91048 Other nonmedicinal substance allergy status: Secondary | ICD-10-CM | POA: Diagnosis not present

## 2019-05-18 DIAGNOSIS — Z833 Family history of diabetes mellitus: Secondary | ICD-10-CM | POA: Diagnosis not present

## 2019-05-18 DIAGNOSIS — Y92239 Unspecified place in hospital as the place of occurrence of the external cause: Secondary | ICD-10-CM | POA: Diagnosis not present

## 2019-05-18 DIAGNOSIS — Z8042 Family history of malignant neoplasm of prostate: Secondary | ICD-10-CM

## 2019-05-18 DIAGNOSIS — J9601 Acute respiratory failure with hypoxia: Secondary | ICD-10-CM | POA: Diagnosis present

## 2019-05-18 DIAGNOSIS — Z87442 Personal history of urinary calculi: Secondary | ICD-10-CM

## 2019-05-18 DIAGNOSIS — Z7984 Long term (current) use of oral hypoglycemic drugs: Secondary | ICD-10-CM

## 2019-05-18 DIAGNOSIS — G4733 Obstructive sleep apnea (adult) (pediatric): Secondary | ICD-10-CM | POA: Diagnosis present

## 2019-05-18 DIAGNOSIS — I10 Essential (primary) hypertension: Secondary | ICD-10-CM | POA: Diagnosis present

## 2019-05-18 DIAGNOSIS — Z96643 Presence of artificial hip joint, bilateral: Secondary | ICD-10-CM | POA: Diagnosis present

## 2019-05-18 DIAGNOSIS — Z7982 Long term (current) use of aspirin: Secondary | ICD-10-CM

## 2019-05-18 DIAGNOSIS — Z79899 Other long term (current) drug therapy: Secondary | ICD-10-CM | POA: Diagnosis not present

## 2019-05-18 DIAGNOSIS — E876 Hypokalemia: Secondary | ICD-10-CM | POA: Diagnosis present

## 2019-05-18 DIAGNOSIS — E785 Hyperlipidemia, unspecified: Secondary | ICD-10-CM | POA: Diagnosis present

## 2019-05-18 LAB — COMPREHENSIVE METABOLIC PANEL
ALT: 35 U/L (ref 0–44)
AST: 37 U/L (ref 15–41)
Albumin: 2.8 g/dL — ABNORMAL LOW (ref 3.5–5.0)
Alkaline Phosphatase: 87 U/L (ref 38–126)
Anion gap: 12 (ref 5–15)
BUN: 11 mg/dL (ref 6–20)
CO2: 27 mmol/L (ref 22–32)
Calcium: 8.2 mg/dL — ABNORMAL LOW (ref 8.9–10.3)
Chloride: 97 mmol/L — ABNORMAL LOW (ref 98–111)
Creatinine, Ser: 0.79 mg/dL (ref 0.61–1.24)
GFR calc Af Amer: >60 mL/min (ref >60)
GFR calc non Af Amer: >60 mL/min (ref >60)
Glucose, Bld: 189 mg/dL — ABNORMAL HIGH (ref 70–99)
Potassium: 3.8 mmol/L (ref 3.5–5.1)
Sodium: 136 mmol/L (ref 135–145)
Total Bilirubin: 0.9 mg/dL (ref 0.3–1.2)
Total Protein: 6.4 g/dL — ABNORMAL LOW (ref 6.5–8.1)

## 2019-05-18 LAB — CBG MONITORING, ED: Glucose-Capillary: 246 mg/dL — ABNORMAL HIGH (ref 70–99)

## 2019-05-18 LAB — PROCALCITONIN: Procalcitonin: <0.1 ng/mL

## 2019-05-18 LAB — C-REACTIVE PROTEIN: CRP: 10.4 mg/dL — ABNORMAL HIGH (ref <1.0)

## 2019-05-18 LAB — LACTIC ACID, PLASMA: Lactic Acid, Venous: 1.8 mmol/L (ref 0.5–1.9)

## 2019-05-18 LAB — GLUCOSE, CAPILLARY: Glucose-Capillary: 278 mg/dL — ABNORMAL HIGH (ref 70–99)

## 2019-05-18 LAB — LACTATE DEHYDROGENASE: LDH: 257 U/L — ABNORMAL HIGH (ref 98–192)

## 2019-05-18 LAB — FIBRINOGEN: Fibrinogen: 646 mg/dL — ABNORMAL HIGH (ref 210–475)

## 2019-05-18 LAB — HIV ANTIBODY (ROUTINE TESTING W REFLEX): HIV Screen 4th Generation wRfx: NONREACTIVE

## 2019-05-18 LAB — D-DIMER, QUANTITATIVE: D-Dimer, Quant: 2.46 ug/mL-FEU — ABNORMAL HIGH (ref 0.00–0.50)

## 2019-05-18 MED ORDER — FUROSEMIDE 20 MG PO TABS
80.0000 mg | ORAL_TABLET | Freq: Two times a day (BID) | ORAL | Status: DC
  Administered 2019-05-18 – 2019-05-22 (×8): 80 mg via ORAL
  Filled 2019-05-18 (×7): qty 4

## 2019-05-18 MED ORDER — SODIUM CHLORIDE 0.9 % IV SOLN
200.0000 mg | Freq: Once | INTRAVENOUS | Status: AC
  Administered 2019-05-18: 200 mg via INTRAVENOUS
  Filled 2019-05-18: qty 40

## 2019-05-18 MED ORDER — INSULIN ASPART 100 UNIT/ML ~~LOC~~ SOLN
0.0000 [IU] | Freq: Every day | SUBCUTANEOUS | Status: DC
  Administered 2019-05-18: 3 [IU] via SUBCUTANEOUS
  Administered 2019-05-19: 5 [IU] via SUBCUTANEOUS
  Administered 2019-05-21: 4 [IU] via SUBCUTANEOUS

## 2019-05-18 MED ORDER — INSULIN ASPART 100 UNIT/ML ~~LOC~~ SOLN
6.0000 [IU] | Freq: Three times a day (TID) | SUBCUTANEOUS | Status: DC
  Administered 2019-05-18 – 2019-05-22 (×12): 6 [IU] via SUBCUTANEOUS

## 2019-05-18 MED ORDER — ONDANSETRON HCL 4 MG/2ML IJ SOLN: 4.0000 mg | Freq: Four times a day (QID) | INTRAMUSCULAR | Status: DC | PRN

## 2019-05-18 MED ORDER — ASPIRIN EC 81 MG PO TBEC
81.0000 mg | DELAYED_RELEASE_TABLET | Freq: Every day | ORAL | Status: DC
  Administered 2019-05-18 – 2019-05-22 (×5): 81 mg via ORAL
  Filled 2019-05-18 (×5): qty 1

## 2019-05-18 MED ORDER — ALBUTEROL SULFATE HFA 108 (90 BASE) MCG/ACT IN AERS
2.0000 | INHALATION_SPRAY | RESPIRATORY_TRACT | Status: DC | PRN
  Administered 2019-05-19: 2 via RESPIRATORY_TRACT
  Filled 2019-05-18: qty 6.7

## 2019-05-18 MED ORDER — ONDANSETRON HCL 4 MG PO TABS: 4.0000 mg | ORAL_TABLET | Freq: Four times a day (QID) | ORAL | Status: DC | PRN

## 2019-05-18 MED ORDER — FLUTICASONE PROPIONATE 50 MCG/ACT NA SUSP
2.0000 | Freq: Every day | NASAL | Status: DC
  Administered 2019-05-19 – 2019-05-22 (×4): 2 via NASAL
  Filled 2019-05-18 (×2): qty 16

## 2019-05-18 MED ORDER — INSULIN ASPART 100 UNIT/ML ~~LOC~~ SOLN
0.0000 [IU] | Freq: Three times a day (TID) | SUBCUTANEOUS | Status: DC
  Administered 2019-05-18: 7 [IU] via SUBCUTANEOUS
  Administered 2019-05-19 – 2019-05-20 (×5): 4 [IU] via SUBCUTANEOUS
  Administered 2019-05-20: 7 [IU] via SUBCUTANEOUS
  Administered 2019-05-21: 3 [IU] via SUBCUTANEOUS
  Administered 2019-05-21: 11 [IU] via SUBCUTANEOUS
  Administered 2019-05-21: 3 [IU] via SUBCUTANEOUS
  Administered 2019-05-22 (×2): 4 [IU] via SUBCUTANEOUS

## 2019-05-18 MED ORDER — FLEET ENEMA 7-19 GM/118ML RE ENEM
1.0000 | ENEMA | Freq: Once | RECTAL | Status: DC | PRN
  Filled 2019-05-18: qty 1

## 2019-05-18 MED ORDER — ASCORBIC ACID 500 MG PO TABS
1000.0000 mg | ORAL_TABLET | Freq: Every day | ORAL | Status: DC
  Administered 2019-05-18 – 2019-05-22 (×5): 1000 mg via ORAL
  Filled 2019-05-18 (×5): qty 2

## 2019-05-18 MED ORDER — SODIUM CHLORIDE 0.9 % IV SOLN
100.0000 mg | Freq: Every day | INTRAVENOUS | Status: AC
  Administered 2019-05-19 – 2019-05-22 (×4): 100 mg via INTRAVENOUS
  Filled 2019-05-18 (×4): qty 20

## 2019-05-18 MED ORDER — ONDANSETRON HCL 4 MG/2ML IJ SOLN
4.0000 mg | Freq: Once | INTRAMUSCULAR | Status: AC
  Administered 2019-05-18: 4 mg via INTRAVENOUS
  Filled 2019-05-18: qty 2

## 2019-05-18 MED ORDER — SODIUM CHLORIDE 0.9 % IV SOLN: 250.0000 mL | INTRAVENOUS | Status: DC | PRN

## 2019-05-18 MED ORDER — BISACODYL 5 MG PO TBEC: 5.0000 mg | DELAYED_RELEASE_TABLET | Freq: Every day | ORAL | Status: DC | PRN

## 2019-05-18 MED ORDER — OXYCODONE HCL 5 MG PO TABS: 5.0000 mg | ORAL_TABLET | ORAL | Status: DC | PRN

## 2019-05-18 MED ORDER — SODIUM CHLORIDE 0.9% FLUSH: 3.0000 mL | INTRAVENOUS | Status: DC | PRN

## 2019-05-18 MED ORDER — POLYETHYLENE GLYCOL 3350 17 G PO PACK: 17.0000 g | PACK | Freq: Every day | ORAL | Status: DC | PRN

## 2019-05-18 MED ORDER — HYDROCOD POLST-CPM POLST ER 10-8 MG/5ML PO SUER
5.0000 mL | Freq: Two times a day (BID) | ORAL | Status: DC | PRN
  Administered 2019-05-20 – 2019-05-21 (×3): 5 mL via ORAL
  Filled 2019-05-18 (×3): qty 5

## 2019-05-18 MED ORDER — ATORVASTATIN CALCIUM 10 MG PO TABS
10.0000 mg | ORAL_TABLET | Freq: Every day | ORAL | Status: DC
  Administered 2019-05-18 – 2019-05-22 (×5): 10 mg via ORAL
  Filled 2019-05-18 (×6): qty 1

## 2019-05-18 MED ORDER — DEXAMETHASONE SODIUM PHOSPHATE 10 MG/ML IJ SOLN
6.0000 mg | INTRAMUSCULAR | Status: DC
  Administered 2019-05-18 – 2019-05-22 (×5): 6 mg via INTRAVENOUS
  Filled 2019-05-18 (×5): qty 1

## 2019-05-18 MED ORDER — SODIUM CHLORIDE 0.9% FLUSH
3.0000 mL | Freq: Two times a day (BID) | INTRAVENOUS | Status: DC
  Administered 2019-05-18 – 2019-05-22 (×9): 3 mL via INTRAVENOUS

## 2019-05-18 MED ORDER — FERROUS SULFATE 325 (65 FE) MG PO TABS
325.0000 mg | ORAL_TABLET | Freq: Every day | ORAL | Status: DC
  Administered 2019-05-18 – 2019-05-22 (×4): 325 mg via ORAL
  Filled 2019-05-18 (×5): qty 1

## 2019-05-18 MED ORDER — POTASSIUM CHLORIDE CRYS ER 20 MEQ PO TBCR
20.0000 meq | EXTENDED_RELEASE_TABLET | Freq: Two times a day (BID) | ORAL | Status: DC
  Administered 2019-05-18 – 2019-05-19 (×2): 20 meq via ORAL
  Filled 2019-05-18 (×2): qty 1

## 2019-05-18 MED ORDER — ENOXAPARIN SODIUM 80 MG/0.8ML ~~LOC~~ SOLN
80.0000 mg | SUBCUTANEOUS | Status: DC
  Administered 2019-05-18 – 2019-05-22 (×5): 80 mg via SUBCUTANEOUS
  Filled 2019-05-18 (×7): qty 0.8

## 2019-05-18 MED ORDER — ACETAMINOPHEN 325 MG PO TABS
650.0000 mg | ORAL_TABLET | Freq: Four times a day (QID) | ORAL | Status: DC | PRN
  Administered 2019-05-19: 650 mg via ORAL
  Filled 2019-05-18: qty 2

## 2019-05-18 MED ORDER — GUAIFENESIN-DM 100-10 MG/5ML PO SYRP
10.0000 mL | ORAL_SOLUTION | ORAL | Status: DC | PRN
  Administered 2019-05-19 – 2019-05-22 (×3): 10 mL via ORAL
  Filled 2019-05-18 (×3): qty 10

## 2019-05-18 MED ORDER — ACETAMINOPHEN 500 MG PO TABS
1000.0000 mg | ORAL_TABLET | Freq: Once | ORAL | Status: AC
  Administered 2019-05-18: 1000 mg via ORAL
  Filled 2019-05-18: qty 2

## 2019-05-18 NOTE — ED Triage Notes (Signed)
Pt here dx with covid on Wednesday, becoming increasingly shob this morning along with severe headache. O2 sats 88% on room air, improvement on 95% on 4L [place] on arrival to ED.

## 2019-05-18 NOTE — ED Provider Notes (Signed)
Henry Hinton Provider Note   CSN: [id number] Arrival date & time: 05/18/19  2376     History Chief Complaint  Patient presents with  . Shortness of Breath    Henry Hinton is a 59 y.o. male.  HPI Patient presents to the emergency [facility] with shortness of breath and was Covid positive.  The patient states that he started having symptoms last Sunday.  And by Wednesday he noticed that he was having a little bit more trouble breathing.  He states that he got his results back and it was positive on Wednesday.  Patient states that this morning he started having increasing shortness of breath yesterday that got worse today.  Patient states that nothing seems to make the condition better but he is worse with exertion.  The patient denies chest pain, shortness of breath, headache,blurred vision, neck pain,weakness, numbness, dizziness, anorexia, edema, abdominal pain, nausea, vomiting, diarrhea, rash, back pain, dysuria, hematemesis, bloody stool, near syncope, or syncope.    Past Medical History:  Diagnosis Date  . Anemia   . Arthritis   . BMI 50.0-59.9, adult ([place])   . Cellulitis 11/2015   trunk  . cll 11/2014   CLL - 11/30/2014- [facility]. [facility] [place], [place]  . DVT (deep venous thrombosis) ([place]) 6/16   Rt calf  took xarelto for 6 months  . Family history of anesthesia complication    " father having delirium after anesthesia"  . Heart murmur    Hx; of as a child  . History of kidney stones    x2  . Joint pain    Hx: of periodically  . Numbness and tingling    Hx: of in right arm  . Pneumonia   . Sleep apnea    Cpap use- settings 10    Patient Active Problem List   Diagnosis Date Noted  . Acute hypoxemic respiratory failure due to COVID-19 ([place]) 05/18/2019  . BPH without obstruction/lower urinary tract symptoms 03/14/2018  . Family history of prostate cancer 03/14/2018  . History of nephrolithiasis 03/14/2018  .  Microscopic hematuria 03/14/2018  . Diarrhea 11/20/2017  . Diabetes mellitus without complication ([place]) 28/31/5176  . Iron deficiency anemia 11/19/2017  . Influenza, pneumonia 09/22/2017  . Abdominal wall cellulitis 01/22/2017  . Insect bite 01/22/2017  . Hyperkalemia 01/22/2017  . OSA (obstructive sleep apnea) 01/22/2017  . Essential hypertension   . Hoarseness 06/08/2016  . Vocal cord paralysis 01/31/2016  . Hypercalciuria 01/10/2016  . Cellulitis 11/28/2015  . Cellulitis of trunk 11/27/2015  . Nonspecific abnormal finding in stool contents   . Colon cancer screening   . Venous stasis   . History of DVT (deep vein thrombosis)   . Sepsis ([place]) 10/09/2015  . Panniculitis 10/09/2015  . CLL (chronic lymphocytic leukemia) ([place]) 05/27/2015  . 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 Calculus of kidney 10/27/2014  . Leukocytosis (leucocytosis) 08/29/2014  . HNP (herniated nucleus pulposus), cervical 01/21/2013  . bilat hip replacement 04/08/2012  . Morbid obesity ([place]) 04/08/2012  . Edema 04/08/2012    Past Surgical History:  Procedure Laterality Date  . BACK SURGERY    . COLONOSCOPY     Hx: of  . COLONOSCOPY N/A 11/23/2015   Procedure: COLONOSCOPY;  Surgeon: Irene Shipper, MD;  [facility];  Service: Endoscopy;  Laterality: N/A;  . INGUINAL HERNIA REPAIR Left 06/01/2015   Procedure: LEFT INGUINAL HERNIA REPAIR WITH MESH;  Surgeon: Autumn Messing III, MD;  [facility]S;  Service: General;  Laterality: Left;  . INGUINAL HERNIA REPAIR    . INSERTION OF MESH Left 06/01/2015   Procedure: INSERTION OF MESH;  Surgeon: Autumn Messing III, MD;  [facility];  Service: General;  Laterality: Left;  . JOINT REPLACEMENT Bilateral    BTHA  . KNEE ARTHROSCOPY Left 07/21/2014   Dr. Noemi Chapel  . LUMBAR LAMINECTOMY  2005  . POSTERIOR CERVICAL LAMINECTOMY WITH MET- RX Right 01/21/2013   Procedure: Right Sitting C6-7 Microdiskectomy with Met-rex;  Surgeon: Kristeen Miss, MD;  Location: [place] NEURO ORS;   Service: Neurosurgery;  Laterality: Right;  Right Sitting C6-7 Microdiskectomy with Met-rex  . STENT PLACEMENT RT URETER ([place] HX)  10/23/2014  . tenosynovitis  2000   Hx: of thumb  . TONSILLECTOMY    . TOTAL HIP ARTHROPLASTY Bilateral   . WISDOM TOOTH EXTRACTION  1974   all 4       Family History  Problem Relation Age of Onset  . COPD Maternal Grandmother   . Heart disease Maternal Grandfather   . Lung cancer Paternal Grandfather        LUNG  . Diabetes Father   . Prostate cancer Father        PROSTATE    Social History   Tobacco Use  . Smoking status: Never Smoker  . Smokeless tobacco: Former Systems developer    Types: Chew  Substance Use Topics  . Alcohol use: Yes    Alcohol/week: 1.0 standard drinks    Types: 1 Standard drinks or equivalent per week    Comment: BEER AND LIQUOR  . Drug use: No    Home Medications Prior to Admission medications   Medication Sig Start Date End Date Taking? Authorizing Provider  Ascorbic Acid (VITAMIN C) 1000 MG tablet Take 1,000 mg by mouth daily.   Yes [provider]  aspirin EC 81 MG tablet Take 81 mg by mouth daily.   Yes [provider]  atorvastatin (LIPITOR) 10 MG tablet Take 1 tablet (10 mg total) by mouth daily. Patient taking differently: Take 10 mg by mouth every 7 (seven) days. Sunday of each week 04/08/19  Yes [name], MD  Dulaglutide (TRULICITY) 7.32 KG/2.5KY SOPN Inject 0.75 mg into the skin once a week. Patient taking differently: Inject 0.75 mg into the skin once a week. Pt takes on Sunday of each week 04/08/19  Yes [name], MD  ferrous sulfate (IRON SUPPLEMENT) 325 (65 FE) MG tablet Take 325 mg by mouth daily with breakfast.   Yes [provider]  fluticasone (FLONASE) 50 MCG/ACT nasal spray SPRAY 2 SPRAYS INTO EACH NOSTRIL EVERY DAY 10/25/18  Yes [name], MD  furosemide (LASIX) 80 MG tablet Take 1 tablet (80 mg total) by mouth 2 (two) times daily. 03/20/19  Yes [name], MD  ibuprofen (ADVIL,MOTRIN) 200 MG tablet Take 600 mg by mouth every 6 (six) hours as needed for mild pain.   Yes [provider]  metFORMIN (GLUCOPHAGE) 1000 MG tablet TAKE 1 TABLET BY MOUTH 2 TIMES DAILY WITH A MEAL. 02/21/19  Yes [name], MD  Multiple Vitamins-Minerals (MULTIVITAMIN WITH MINERALS) tablet Take 1 tablet by mouth daily.   Yes [provider]  potassium chloride SA (KLOR-CON M20) 20 MEQ tablet Take 1 tablet (20 mEq total) by mouth 2 (two) times daily. 03/20/19  Yes [name], MD    Allergies    Adhesive [tape]  Review of Systems   Review of Systems  All other systems negative except as documented in the HPI. All pertinent positives and negatives as reviewed in the HPI. Physical Exam Updated Vital Signs BP 104/84   Pulse 72   Temp 98.7 F (37.1 C) (Oral)   Resp 17   Ht '5\' 9"'  (1.753 m)   Wt (!) 164.7 kg   SpO2 95%   BMI 53.61 kg/m   Physical Exam Vitals and nursing note reviewed.  Constitutional:      General: He is not in acute distress.    Appearance: He is well-developed.  HENT:     Head: Normocephalic and atraumatic.  Eyes:     Pupils: Pupils are equal, round, and reactive to light.  Cardiovascular:     Rate and Rhythm: Normal rate and regular rhythm.     Heart sounds: Normal heart sounds. No murmur. No friction rub. No gallop.   Pulmonary:     Effort: Pulmonary effort is normal. No respiratory distress.     Breath sounds: Normal breath sounds. No wheezing or rales.  Abdominal:     General: Bowel sounds are normal. There is no distension.     Palpations: Abdomen is soft.     Tenderness: There is no abdominal tenderness.  Musculoskeletal:     Cervical back: Normal range of motion and neck supple.  Skin:    General: Skin is warm and dry.     Capillary Refill: Capillary refill takes less than 2 seconds.     Findings: No erythema or rash.  Neurological:     Mental Status: He is alert and oriented to  person, place, and time.     Motor: No abnormal muscle tone.     Coordination: Coordination normal.  Psychiatric:        Behavior: Behavior normal.     ED Results / Procedures / Treatments   Labs (all labs ordered are listed, but only abnormal results are displayed) Labs Reviewed  CBC WITH DIFFERENTIAL/PLATELET - Abnormal; Notable for the following components:      Result Value   WBC 70.2 (*)    RBC 4.16 (*)    Hemoglobin 11.4 (*)    HCT 37.5 (*)    Platelets 136 (*)    Lymphs Abs 64.9 (*)    Abs Immature Granulocytes 0.11 (*)    All other components within normal limits  COMPREHENSIVE METABOLIC PANEL - Abnormal; Notable for the following components:   Chloride 97 (*)    Glucose, Bld 189 (*)    Calcium 8.2 (*)    Total Protein 6.4 (*)    Albumin 2.8 (*)    All other components within normal limits  LACTATE DEHYDROGENASE - Abnormal; Notable for the following components:   LDH 257 (*)    All other components within normal limits  D-DIMER, QUANTITATIVE (NOT AT [facility]) - Abnormal; Notable for the following components:   D-Dimer, Quant 2.46 (*)    All other components within normal limits  C-REACTIVE PROTEIN - Abnormal; Notable for the following components:   CRP 10.4 (*)    All other components within normal limits  FIBRINOGEN - Abnormal; Notable for the following components:   Fibrinogen 646 (*)    All other components within normal limits  LACTIC ACID, PLASMA  PATHOLOGIST SMEAR REVIEW  HIV ANTIBODY (ROUTINE TESTING W REFLEX)    EKG None  Radiology DG Chest Port 1 View  Result Date: 05/18/2019 CLINICAL DATA:  COVID positive.  Creasing short of breath EXAM: PORTABLE CHEST 1 VIEW COMPARISON:  None. FINDINGS: Normal cardiac silhouette. Low lung volumes. Patient rotated rightward. Cardiac leads bundle bundle over RIGHT chest. Potential nodular airspace disease in the RIGHT lower lobe. LEFT lung clear. IMPRESSION: Suboptimal radiograph. Concern for nodular airspace disease  in the RIGHT lower lobe. Electronically Signed   By: Suzy Bouchard M.D.   On: 05/18/2019 09:56    Procedures Procedures (including critical care time)  Medications Ordered in ED Medications  aspirin EC tablet 81 mg (81 mg Oral Given 05/18/19 1444)  atorvastatin (LIPITOR) tablet 10 mg (10 mg Oral Given 05/18/19 1444)  fluticasone (FLONASE) 50 MCG/ACT nasal spray 2 spray (has no administration in time range)  enoxaparin (LOVENOX) injection 80 mg (has no administration in time range)  sodium chloride flush (NS) 0.9 % injection 3 mL (3 mLs Intravenous Given 05/18/19 1444)  sodium chloride flush (NS) 0.9 % injection 3 mL (has no administration in time range)  0.9 %  sodium chloride infusion (has no administration in time range)  remdesivir 200 mg in sodium chloride 0.9% 250 mL IVPB (200 mg Intravenous New Bag/Given 05/18/19 1443)    Followed by  remdesivir 100 mg in sodium chloride 0.9 % 100 mL IVPB (has no administration in time range)  albuterol (VENTOLIN HFA) 108 (90 Base) MCG/ACT inhaler 2 puff (has no administration in time range)  guaiFENesin-dextromethorphan (ROBITUSSIN DM) 100-10 MG/5ML syrup 10 mL (has no administration in time range)  chlorpheniramine-HYDROcodone (TUSSIONEX) 10-8 MG/5ML suspension 5 mL (has no administration in time range)  acetaminophen (TYLENOL) tablet 650 mg (has no administration in time range)  oxyCODONE (Oxy IR/ROXICODONE) immediate release tablet 5 mg (has no administration in time range)  polyethylene glycol (MIRALAX / GLYCOLAX) packet 17 g (has no administration in time range)  bisacodyl (DULCOLAX) EC tablet 5 mg (has no administration in time range)  sodium phosphate (FLEET) 7-19 GM/118ML enema 1 enema (has no administration in time range)  ondansetron (ZOFRAN) tablet 4 mg (has no administration in time range)    Or  ondansetron (ZOFRAN) injection 4 mg (has no administration in time range)  dexamethasone (DECADRON) injection 6 mg (6 mg Intravenous Given  05/18/19 1444)  insulin aspart (novoLOG) injection 0-20 Units (has no administration in time range)  insulin aspart (novoLOG) injection 0-5 Units (has no administration in time range)  insulin aspart (novoLOG) injection 6 Units (has no administration in time range)  ondansetron (ZOFRAN) injection 4 mg (4 mg Intravenous Given 05/18/19 0943)  acetaminophen (TYLENOL) tablet 1,000 mg (1,000 mg Oral Given 05/18/19 1132)    ED Course  I have reviewed the triage vital signs and the nursing notes.  Pertinent labs & imaging results that were available during my care of the patient were reviewed by me and considered in my medical decision making (see chart for details).    MDM Rules/Calculators/A&P                      Patient will need admission to the hospital for Covid infection that is causing hypoxia.  The patient otherwise appears stable.  Patient has done well on 2 L of nasal cannula oxygen.  Henry Hinton was evaluated in Emergency [facility] on 05/18/2019 for the symptoms described in the history of present illness. He was evaluated in the context of the global COVID-19 pandemic, which necessitated consideration that the patient might be at risk for infection with the SARS-CoV-2 virus that causes COVID-19. Institutional protocols and algorithms that pertain to the evaluation of patients at risk for  COVID-19 are in a state of rapid change based on information released by regulatory bodies including the CDC and federal and state organizations. These policies and algorithms were followed during the patient's care in the ED.  Final Clinical Impression(s) / ED Diagnoses Final diagnoses:  COVID-19 virus infection    Rx / DC Orders ED Discharge Orders    None       Dalia Heading, PA-C 05/18/19 1451    Dorie Rank, MD 05/19/19 1708

## 2019-05-18 NOTE — ED Notes (Signed)
Called [facility] . Wait time they couldn't tell me

## 2019-05-18 NOTE — Progress Notes (Signed)
Henry Hinton  [id number] DOB: [birth year] DOA: 05/18/2019 PCP: [name], MD    Brief Narrative:  59 year old with a history of morbid obesity, DM, OSA on CPAP, CLL, and remote DVT who presented to the ED with known Covid positive status complaining of shortness of breath.  He tested positive for Covid 1/13, but had symptoms for approximately 1 week prior to his ED presentation.  He was found to have an oxygen saturation of 85% and admitted via the [facility] ED.  Significant Events: 1/13 Covid test positive 1/17 admit via [facility] -transferred to [facility]  COVID-19 specific Treatment: Decadron 1/17 > Remdesivir 1/17 >  Antimicrobials:  None  Subjective: Pt was admitted by Dr. Lorin Mercy earlier this afternoon.   Assessment & Plan:  COVID Pneumonia -acute hypoxic respiratory failure Presently maintaining saturations on 4-5 L/min nasal cannula -continue remdesivir and Decadron -given relative clinical stability at present will hold on Actemra or baricitinib  Recent Labs  Lab 05/18/19 0920  DDIMER 2.46*  CRP 10.4*  ALT 35    DM 2  CLL Admitting MD discussed with patient's Hematologist who recommended no specific changes in treatment at this time  Remote history of DVT Was treated with 6 months of Xarelto  Morbid obesity  OSA On nightly CPAP at home  DVT prophylaxis: Lovenox Code Status: FULL CODE Family Communication:  Disposition Plan:   Consultants:  none  Objective: Blood pressure (!) 106/53, pulse 69, temperature 99.7 F (37.6 C), resp. rate (!) 25, height 5\' 9"  (1.753 m), weight (!) 164.7 kg, SpO2 95 %.  Intake/Output Summary (Last 24 hours) at 05/18/2019 1805 Last data filed at 05/18/2019 1547 Gross per 24 hour  Intake 650 ml  Output --  Net 650 ml   Filed Weights   05/18/19 0821  Weight: (!) 164.7 kg    Examination:   CBC: Recent Labs  Lab 05/18/19 0920  WBC 70.2*  NEUTROABS 4.8  HGB 11.4*  HCT 37.5*  MCV 90.1   PLT [id number]*   Basic Metabolic Panel: Recent Labs  Lab 05/18/19 0920  NA 136  K 3.8  CL 97*  CO2 27  GLUCOSE 189*  BUN 11  CREATININE 0.79  CALCIUM 8.2*   GFR: Estimated Creatinine Clearance: 154.2 mL/min (by C-G formula based on SCr of 0.79 mg/dL).  Liver Function Tests: Recent Labs  Lab 05/18/19 0920  AST 37  ALT 35  ALKPHOS 87  BILITOT 0.9  PROT 6.4*  ALBUMIN 2.8*    HbA1C: Hgb A1c MFr Bld  Date/Time Value Ref Range Status  03/20/2019 10:10 AM 8.4 (H) 4.8 - 5.6 % Final    Comment:             Prediabetes: 5.7 - 6.4          Diabetes: >6.4          Glycemic control for adults with diabetes: <7.0   12/25/2018 11:52 AM 8.3 (H) 4.8 - 5.6 % Final    Comment:             Prediabetes: 5.7 - 6.4          Diabetes: >6.4          Glycemic control for adults with diabetes: <7.0     CBG: Recent Labs  Lab 05/18/19 1658  GLUCAP 246*     Scheduled Meds: . aspirin EC  81 mg Oral Daily  . atorvastatin  10 mg Oral Daily  . dexamethasone (DECADRON)  injection  6 mg Intravenous Q24H  . enoxaparin (LOVENOX) injection  80 mg Subcutaneous Q24H  . [START ON 05/19/2019] fluticasone  2 spray Each Nare Daily  . insulin aspart  0-20 Units Subcutaneous TID WC  . insulin aspart  0-5 Units Subcutaneous QHS  . insulin aspart  6 Units Subcutaneous TID WC  . sodium chloride flush  3 mL Intravenous Q12H   Continuous Infusions: . sodium chloride    . [START ON 05/19/2019] remdesivir 100 mg in NS 100 mL       LOS: 0 days   Cherene Altes, MD [facility] Office  [phone] Pager - Text Page per Amion  If 7PM-7AM, please contact night-coverage per Amion 05/18/2019, 6:05 PM

## 2019-05-18 NOTE — H&P (Signed)
History and Physical    Henry Hinton TWS:[id number] DOB: 10-Nov-1960 DOA: 05/18/2019  PCP: Henry Agreste, MD Consultants:  [place]; Venia Minks - urology Patient coming from:  Home - lives alone; NOK: Mother, Henry Hinton, [phone]  Chief Complaint: SOB with COVID-19  HPI: Henry Hinton is a 59 y.o. male with medical history significant of morbid obesity (BMI 53.6); DM; OSA on CPAP; CLL; and remote DVT, no longer on [facility] presenting with SOB with known COVID-19 infection. Patient reports that he got sick a week ago.  He initially had fevers >102 and generally felt ill.  Over the past 2-3 days, he has been increasingly SOB with cough.  He is currently on 5L [place] O2.   ED Course: He is a morbidly obese 59 year old gentleman with multiple medical problems who was diagnosed with Covid on Wednesday. He started with symptoms sounds like early Sunday a week ago. He started with shortness of breath yesterday and got worse over the last 24 hours. EMS arrived and his pulse oximetry was 85% and placed on oxygen and went to 97%.  Also with CLL with chronic WBC count elevation.   Review of Systems: As per HPI; otherwise review of systems reviewed and negative.   Ambulatory Status: Ambulates without assistance  Past Medical History:  Diagnosis Date  . Anemia   . Arthritis   . BMI 50.0-59.9, adult ([place])   . Cellulitis 11/2015   trunk  . cll 11/2014   CLL - 11/30/2014- Dr. [facility] [place], [place]  . DVT (deep venous thrombosis) ([place]) 6/16   Rt calf  took xarelto for 6 months  . Family history of anesthesia complication    " father having delirium after anesthesia"  . Heart murmur    Hx; of as a child  . History of kidney stones    x2  . Joint pain    Hx: of periodically  . Numbness and tingling    Hx: of in right arm  . Pneumonia   . Sleep apnea    Cpap use- settings 10    Past Surgical History:  Procedure Laterality Date  . BACK SURGERY    .  COLONOSCOPY     Hx: of  . COLONOSCOPY N/A 11/23/2015   Procedure: COLONOSCOPY;  Surgeon: Irene Shipper, MD;  Location: [facility];  Service: Endoscopy;  Laterality: N/A;  . INGUINAL HERNIA REPAIR Left 06/01/2015   Procedure: LEFT INGUINAL HERNIA REPAIR WITH MESH;  Surgeon: Autumn Messing III, MD;  Location: [facility];  Service: General;  Laterality: Left;  . INGUINAL HERNIA REPAIR    . INSERTION OF MESH Left 06/01/2015   Procedure: INSERTION OF MESH;  Surgeon: Autumn Messing III, MD;  Location: [facility];  Service: General;  Laterality: Left;  . JOINT REPLACEMENT Bilateral    BTHA  . KNEE ARTHROSCOPY Left 07/21/2014   Dr. Noemi Chapel  . LUMBAR LAMINECTOMY  2005  . POSTERIOR CERVICAL LAMINECTOMY WITH MET- RX Right 01/21/2013   Procedure: Right Sitting C6-7 Microdiskectomy with Met-rex;  Surgeon: Kristeen Miss, MD;  Location: [facility];  Service: Neurosurgery;  Laterality: Right;  Right Sitting C6-7 Microdiskectomy with Met-rex  . STENT PLACEMENT RT URETER ([place] [facility]X)  10/23/2014  . tenosynovitis  2000   Hx: of thumb  . TONSILLECTOMY    . TOTAL HIP ARTHROPLASTY Bilateral   . WISDOM TOOTH EXTRACTION  1974   all 4    Social History   Socioeconomic History  . Marital  status: Divorced    Spouse name: Not on file  . Number of children: 0  . Years of education: Not on file  . Highest education level: Not on file  Occupational History  . Occupation: Geophysical data processor: [employer]  Tobacco Use  . Smoking status: Never Smoker  . Smokeless tobacco: Former Systems developer    Types: Chew  Substance and Sexual Activity  . Alcohol use: Yes    Alcohol/week: 1.0 standard drinks    Types: 1 Standard drinks or equivalent per week    Comment: BE[employer]QU[employer]  . Drug use: No  . Sexual activity: Not Currently  Other Topics Concern  . Not on file  Social History Narrative   Exercise swimming 3-5 times/week for 45 minutes   Social Determinants of Health   Financial Resource Strain:   .  Difficulty of Paying Living Expenses: Not on file  Food Insecurity:   . Worried About Charity fundraiser in the Last Year: Not on file  . Ran Out of Food in the Last Year: Not on file  Transportation Needs:   . Lack of Transportation (Medical): Not on file  . Lack of Transportation (Non-Medical): Not on file  Physical Activity:   . Days of Exercise per Week: Not on file  . Minutes of Exercise per Session: Not on file  Stress:   . Feeling of Stress : Not on file  Social Connections:   . Frequency of Communication with Friends and Family: Not on file  . Frequency of Social Gatherings with Friends and Family: Not on file  . Attends Religious Services: Not on file  . Active Member of Clubs or Organizations: Not on file  . Attends Archivist Meetings: Not on file  . Marital Status: Not on file  Intimate Partner Violence:   . Fear of Current or Ex-Partner: Not on file  . Emotionally Abused: Not on file  . Physically Abused: Not on file  . Sexually Abused: Not on file    Allergies  Allergen Reactions  . Adhesive [Tape] Other (See Comments)    Blisters and rash at site    Family History  Problem Relation Age of Onset  . COPD Maternal Grandmother   . Heart disease Maternal Grandfather   . Lung cancer Paternal Grandfather        LUNG  . Diabetes Father   . Prostate cancer Father        PROSTATE    Prior to Admission medications   Medication Sig Start Date End Date Taking? Authorizing Provider  Ascorbic Acid (VITAMIN C) 1000 MG tablet Take 1,000 mg by mouth daily.   Yes [provider]  aspirin EC 81 MG tablet Take 81 mg by mouth daily.   Yes [provider]  atorvastatin (LIPITOR) 10 MG tablet Take 1 tablet (10 mg total) by mouth daily. Patient taking differently: Take 10 mg by mouth every 7 (seven) days. Sunday of each week 04/08/19  Yes Henry Agreste, MD  Dulaglutide (TRULICITY) 5.37 SM/2.7MB SOPN Inject 0.75 mg into the skin once a  week. Patient taking differently: Inject 0.75 mg into the skin once a week. Pt takes on Sunday of each week 04/08/19  Yes Henry Agreste, MD  ferrous sulfate (IRON SUPPLEMENT) 325 (65 FE) MG tablet Take 325 mg by mouth daily with breakfast.   Yes [provider]  fluticasone (FLONASE) 50 MCG/ACT nasal spray SPRAY 2 SPRAYS INTO EACH NOSTRIL  EVERY DAY 10/25/18  Yes Henry Agreste, MD  furosemide (LASIX) 80 MG tablet Take 1 tablet (80 mg total) by mouth 2 (two) times daily. 03/20/19  Yes Henry Agreste, MD  ibuprofen (ADVIL,MOTRIN) 200 MG tablet Take 600 mg by mouth every 6 (six) hours as needed for mild pain.   Yes [provider]  metFORMIN (GLUCOPHAGE) 1000 MG tablet TAKE 1 TABLET BY MOUTH 2 TIMES DAILY WITH A MEAL. 02/21/19  Yes Henry Agreste, MD  Multiple Vitamins-Minerals (MULTIVITAMIN WITH MINERALS) tablet Take 1 tablet by mouth daily.   Yes [provider]  potassium chloride SA (KLOR-CON M20) 20 MEQ tablet Take 1 tablet (20 mEq total) by mouth 2 (two) times daily. 03/20/19  Yes Henry Agreste, MD    Physical Exam: Vitals:   05/18/19 1600 05/18/19 1615 05/18/19 1743 05/18/19 1814  BP: (!) 119/51 (!) 107/42 (!) 106/53 (!) 141/76  Pulse: 74 68 69 77  Resp: (!) 27 (!) 33 (!) 25 (!) 24  Temp:   99.7 F (37.6 C) 98.2 F (36.8 C)  TempSrc:    Oral  SpO2: 95% 93% 95% 90%  Weight:      Height:         . General:  Appears ill, obese . Eyes:  PERRL, EOMI, normal lids, iris . ENT:  grossly normal hearing, lips & tongue, mmm . Neck:  no LAD, masses or thyromegaly . Cardiovascular:  RRR, no m/r/g. No LE edema.  Marland Kitchen Respiratory:   Scattered rhonchi.  Mildly increased respiratory effort on 5L [place] O2 . Abdomen:  soft, NT, ND, NABS . Skin:  no rash or induration seen on limited exam . Musculoskeletal:  grossly normal tone BUE/BLE, good ROM, no bony abnormality . Psychiatric:  blunted mood and affect, speech fluent and appropriate, AOx3 . Neurologic:  CN  2-12 grossly intact, moves all extremities in coordinated fashion    Radiological Exams on Admission: DG Chest Port 1 View  Result Date: 05/18/2019 CLINICAL DATA:  COVID positive.  Creasing short of breath EXAM: PORTABLE CHEST 1 VIEW COMPARISON:  None. FINDINGS: Normal cardiac silhouette. Low lung volumes. Patient rotated rightward. Cardiac leads bundle bundle over RIGHT chest. Potential nodular airspace disease in the RIGHT lower lobe. LEFT lung clear. IMPRESSION: Suboptimal radiograph. Concern for nodular airspace disease in the RIGHT lower lobe. Electronically Signed   By: Suzy Bouchard M.D.   On: 05/18/2019 09:56    EKG: Independently reviewed.  NSR with rate 88; no evidence of acute ischemia   Labs on Admission: I have personally reviewed the available labs and imaging studies at the time of the admission.  Pertinent labs:   Glucose 189 Albumin 2.8 LDH 257 CRP 10.4 Lactate 1.8 WBC 70.2; 92.2 on 05/05/19 Hgb 11.4 Platelets 136 D-dimer 2.46 Fibrinogen 646 A1c 8.4 on 11/19   Assessment/Plan Principal Problem:   Acute hypoxemic respiratory failure due to COVID-19 [facility]) Active Problems:   Morbid obesity (HCC)   CLL (chronic lymphocytic leukemia) ([place])   Essential hypertension   OSA (obstructive sleep apnea)   Diabetes mellitus without complication (HCC)   Acute respiratory failure with hypoxia due to COVID-19 infection -Patient with presenting with SOB and reported fever at home; also with cough productive of clear sputum -He does not have a usual home O2 requirement and is currently requiring 5L [place] O2 -COVID POSITIVE -The patient has comorbidities which may increase the risk for ARDS/MODS including:  HTN, DM, obesity -Exam is concerning for development of ARDS/MODS  due to respiratory distress -Pertinent labs concerning for COVID include markedly elevated D-dimer (>1); markedly elevated CRP (>7)RLL nodular opacity which may be c/w COVID vs. PNA; CT chest was not  performed -Will check procalcitonin; hold antibiotics for now -Will admit to [facility] for further evaluation, close monitoring, and treatment -Monitor on telemetry x at least 24 hours -At this time, will attempt to avoid use of aerosolized medications and use HFAs instead -Will check daily labs including BMP with Mag, Phos; LFTs; CBC with differential; CRP; ferritin; fibrinogen; D-dimer  -Will order steroids (1 mg/kg divided BID) and Remdesivir (pharmacy consult) given +COVID test, +CXR, and hypoxia <94% on room air -If the patient shows clinical deterioration, consider transfer to ICU with PCCM consultation -If the patient is hypoxic or on >3L oxygen from baseline or CRP >7, considerTocilizumab 8 mg/kg x1 IF + COVID test; O2 sats <88% on room air; and patient is at high risk for intubation.  Will defer to [facility] doctors regarding this medication since it is being used off label. -Will attempt to maintain euvolemia to a net negative fluid status -Will ask the patient to maintain an awake prone position for 16+ hours a day, if possible, with a minimum of 2-3 hours at a time -With D-dimer <5, will use standard-dosed Lovenox for DVT prevention -Patient was seen wearing full PPE including: gown, gloves, head cover, N95, and face shield; donning and doffing was in compliance with current standards.  CLL -Markedly elevated WBC count but actually improved from prior -Possible mild (relative) pancytopenia from viral bone marrow suppression -I spoke to Dr. Alvy Bimler who confirmed that standard treatments are appropriate for COVID -Continue surveillance -Continue ASA  DM -Prior A1c was 8.4, indicating poor control -hold Glucophage, Trulicity -Cover with mealtime bolus insulin and resistant-scale SSI  HTN -Appears to take only Lasix - ?for HTN - will hold for now -Will monitor without treatment for now  HLD -Continue Lipitor  OSA -Wears CPAP at home -No CPAP in the setting of COVID due to  aerosolization of the infectious particles  Morbid obesity -BMI 53.6 -Weight loss should be encouraged -Outpatient PCP/bariatric medicine/bariatric surgery f/u encouraged    DVT prophylaxis:  Lovenox  Code Status:  Full - confirmed with patient Family Communication: None present; Ieft a message for the patient's mother by telephone. Disposition Plan:  Home once clinically improved Consults called: None  Admission status: Admit - It is my clinical opinion that admission to INPATIENT is reasonable and necessary because of the expectation that this patient will require hospital care that crosses at least 2 midnights to treat this condition based on the medical complexity of the problems presented.  Given the aforementioned information, the predictability of an adverse outcome is felt to be significant.     Karmen Bongo MD [facility]   How to contact the [facility] Attending or Consulting provider [place] or covering provider during after hours [place], for this patient?  1. Check the care team in [facility] and look for a) attending/consulting [facility] provider listed and b) the [facility] team listed 2. Log into [email or web address] and use [place]'s universal password to access. If you do not have the password, please contact the hospital operator. 3. Locate the [facility] provider you are looking for under [facility] and page to a number that you can be directly reached. 4. If you still have difficulty reaching the provider, please page the [facility] (Director on Call) for the Hospitalists listed on amion for assistance.  05/18/2019, 6:31 PM

## 2019-05-19 LAB — CBC WITH DIFFERENTIAL/PLATELET
Abs Immature Granulocytes: 0.11 10*3/uL — ABNORMAL HIGH (ref 0.00–0.07)
Abs Immature Granulocytes: 0.15 10*3/uL — ABNORMAL HIGH (ref 0.00–0.07)
Basophils Absolute: 0.1 10*3/uL (ref 0.0–0.1)
Basophils Absolute: 0 10*3/uL (ref 0.0–0.1)
Basophils Relative: 0 %
Basophils Relative: 0 %
Eosinophils Absolute: 0 10*3/uL (ref 0.0–0.5)
Eosinophils Absolute: 0 10*3/uL (ref 0.0–0.5)
Eosinophils Relative: 0 %
Eosinophils Relative: 0 %
HCT: 37.5 % — ABNORMAL LOW (ref 39.0–52.0)
HCT: 37.8 % — ABNORMAL LOW (ref 39.0–52.0)
Hemoglobin: 11.4 g/dL — ABNORMAL LOW (ref 13.0–17.0)
Hemoglobin: 11.4 g/dL — ABNORMAL LOW (ref 13.0–17.0)
Immature Granulocytes: 0 %
Immature Granulocytes: 0 %
Lymphocytes Relative: 93 %
Lymphocytes Relative: 92 %
Lymphs Abs: 64.9 10*3/uL — ABNORMAL HIGH (ref 0.7–4.0)
Lymphs Abs: 60.7 10*3/uL — ABNORMAL HIGH (ref 0.7–4.0)
MCH: 27.4 pg (ref 26.0–34.0)
MCH: 26.9 pg (ref 26.0–34.0)
MCHC: 30.4 g/dL (ref 30.0–36.0)
MCHC: 30.2 g/dL (ref 30.0–36.0)
MCV: 90.1 fL (ref 80.0–100.0)
MCV: 89.2 fL (ref 80.0–100.0)
Monocytes Absolute: 0.3 10*3/uL (ref 0.1–1.0)
Monocytes Absolute: 0.5 10*3/uL (ref 0.1–1.0)
Monocytes Relative: 0 %
Monocytes Relative: 1 %
Neutro Abs: 4.8 10*3/uL (ref 1.7–7.7)
Neutro Abs: 4.9 10*3/uL (ref 1.7–7.7)
Neutrophils Relative %: 7 %
Neutrophils Relative %: 7 %
Platelets: 136 10*3/uL — ABNORMAL LOW (ref 150–400)
Platelets: 149 10*3/uL — ABNORMAL LOW (ref 150–400)
RBC: 4.16 MIL/uL — ABNORMAL LOW (ref 4.22–5.81)
RBC: 4.24 MIL/uL (ref 4.22–5.81)
RDW: 15.4 % (ref 11.5–15.5)
RDW: 15.1 % (ref 11.5–15.5)
WBC Morphology: ABNORMAL
WBC Morphology: ABNORMAL
WBC: 70.2 10*3/uL (ref 4.0–10.5)
WBC: 66.2 10*3/uL (ref 4.0–10.5)
nRBC: 0 % (ref 0.0–0.2)
nRBC: 0 % (ref 0.0–0.2)

## 2019-05-19 LAB — COMPREHENSIVE METABOLIC PANEL
ALT: 48 U/L — ABNORMAL HIGH (ref 0–44)
AST: 44 U/L — ABNORMAL HIGH (ref 15–41)
Albumin: 3.1 g/dL — ABNORMAL LOW (ref 3.5–5.0)
Alkaline Phosphatase: 95 U/L (ref 38–126)
Anion gap: 11 (ref 5–15)
BUN: 20 mg/dL (ref 6–20)
CO2: 29 mmol/L (ref 22–32)
Calcium: 8.3 mg/dL — ABNORMAL LOW (ref 8.9–10.3)
Chloride: 98 mmol/L (ref 98–111)
Creatinine, Ser: 0.76 mg/dL (ref 0.61–1.24)
GFR calc Af Amer: >60 mL/min (ref >60)
GFR calc non Af Amer: >60 mL/min (ref >60)
Glucose, Bld: 222 mg/dL — ABNORMAL HIGH (ref 70–99)
Potassium: 3.2 mmol/L — ABNORMAL LOW (ref 3.5–5.1)
Sodium: 138 mmol/L (ref 135–145)
Total Bilirubin: 0.5 mg/dL (ref 0.3–1.2)
Total Protein: 7 g/dL (ref 6.5–8.1)

## 2019-05-19 LAB — MAGNESIUM: Magnesium: 2 mg/dL (ref 1.7–2.4)

## 2019-05-19 LAB — D-DIMER, QUANTITATIVE: D-Dimer, Quant: 1.75 ug/mL-FEU — ABNORMAL HIGH (ref 0.00–0.50)

## 2019-05-19 LAB — FERRITIN: Ferritin: 622 ng/mL — ABNORMAL HIGH (ref 24–336)

## 2019-05-19 LAB — GLUCOSE, CAPILLARY
Glucose-Capillary: 157 mg/dL — ABNORMAL HIGH (ref 70–99)
Glucose-Capillary: 172 mg/dL — ABNORMAL HIGH (ref 70–99)
Glucose-Capillary: 164 mg/dL — ABNORMAL HIGH (ref 70–99)
Glucose-Capillary: 397 mg/dL — ABNORMAL HIGH (ref 70–99)

## 2019-05-19 LAB — C-REACTIVE PROTEIN: CRP: 9.4 mg/dL — ABNORMAL HIGH (ref <1.0)

## 2019-05-19 MED ORDER — POTASSIUM CHLORIDE CRYS ER 20 MEQ PO TBCR
40.0000 meq | EXTENDED_RELEASE_TABLET | Freq: Two times a day (BID) | ORAL | Status: DC
  Administered 2019-05-19 – 2019-05-22 (×7): 40 meq via ORAL
  Filled 2019-05-19 (×7): qty 2

## 2019-05-19 MED ORDER — POTASSIUM CHLORIDE CRYS ER 20 MEQ PO TBCR: 40.0000 meq | EXTENDED_RELEASE_TABLET | Freq: Two times a day (BID) | ORAL | Status: DC

## 2019-05-19 NOTE — Progress Notes (Signed)
Henry Hinton  [id number] DOB: February 17, 1961 DOA: 05/18/2019 PCP: [name], MD    Brief Narrative:  59 year old with a history of morbid obesity, DM, OSA on CPAP, CLL, and remote DVT who presented to the ED with known Covid positive status complaining of shortness of breath.  He tested positive for Covid 1/13, but had symptoms for approximately 1 week prior to his ED presentation.  He was found to have an oxygen saturation of 85% and admitted via the [facility] ED.  Significant Events: 1/13 Covid test positive 1/17 admit via [facility] -transferred to [facility]  COVID-19 specific Treatment: Decadron 1/17 > Remdesivir 1/17 >  Antimicrobials:  None  Subjective: Saturations stable on 4 L conventional nasal cannula at present.  Vital signs otherwise stable. States he is feeling much better. Denies chest pain. Poor intake/appetite.   Assessment & Plan:  COVID Pneumonia -acute hypoxic respiratory failure continue remdesivir and Decadron -given relative clinical stability at present will hold on Actemra or baricitinib - mobilize - encouraged IS   Recent Labs  Lab 05/18/19 0920 05/18/19 2100 05/19/19 1006 05/19/19 1016  DDIMER 2.46*  --  1.75*  --   FERRITIN  --   --   --  622*  CRP 10.4*  --   --  9.4*  ALT 35  --  48*  --   PROCALCITON  --  <0.10  --   --     DM 2 CBG reasonably controlled at this time - follow trend w/ SSI and adjust insulin as necessary  Hypokalemia  Supplement and follow - check Mg   CLL Admitting MD discussed with patient's Hematologist who recommended no specific changes in treatment at this time - WBC trending down   Remote history of DVT Was treated with 6 months of Xarelto - on DVT prophy dosing now   Mild transaminitis  Due to COVID +/- remdesivir - follow trend   Morbid obesity - Estimated body mass index is 53.61 kg/m as calculated from the following:   Height as of this encounter: 5\' 9"  (1.753 m).   Weight as of this  encounter: 164.7 kg.  OSA On nightly CPAP at home  DVT prophylaxis: Lovenox Code Status: FULL CODE Family Communication:  Disposition Plan: tele   Consultants:  none  Objective: Blood pressure (!) 105/49, pulse 61, temperature 97.7 F (36.5 C), temperature source Oral, resp. rate (!) 26, height 5\' 9"  (1.753 m), weight (!) 164.7 kg, SpO2 93 %.  Intake/Output Summary (Last 24 hours) at 05/19/2019 1432 Last data filed at 05/19/2019 0600 Gross per 24 hour  Intake 1250 ml  Output 1300 ml  Net -50 ml   Filed Weights   05/18/19 0821  Weight: (!) 164.7 kg    Examination: General: No acute respiratory distress Lungs: fine crackles B bases - no wheezing  Cardiovascular: Regular rate and rhythm - distant SH  Abdomen: Nontender, morbidly obese, soft, bowel sounds positive, no rebound, no ascites, no appreciable mass Extremities: trace chronic appearing edema B LE    CBC: Recent Labs  Lab 05/18/19 0920 05/19/19 1006  WBC 70.2* 66.2*  NEUTROABS 4.8 4.9  HGB 11.4* 11.4*  HCT 37.5* 37.8*  MCV 90.1 89.2  PLT 136* [id number]*   Basic Metabolic Panel: Recent Labs  Lab 05/18/19 0920 05/19/19 1006  NA 136 138  K 3.8 3.2*  CL 97* 98  CO2 27 29  GLUCOSE 189* 222*  BUN 11 20  CREATININE 0.79 0.76  CALCIUM 8.2* 8.3*  MG  --  2.0   GFR: Estimated Creatinine Clearance: 154.2 mL/min (by C-G formula based on SCr of 0.76 mg/dL).  Liver Function Tests: Recent Labs  Lab 05/18/19 0920 05/19/19 1006  AST 37 44*  ALT 35 48*  ALKPHOS 87 95  BILITOT 0.9 0.5  PROT 6.4* 7.0  ALBUMIN 2.8* 3.1*    HbA1C: Hgb A1c MFr Bld  Date/Time Value Ref Range Status  03/20/2019 10:10 AM 8.4 (H) 4.8 - 5.6 % Final    Comment:             Prediabetes: 5.7 - 6.4          Diabetes: >6.4          Glycemic control for adults with diabetes: <7.0   12/25/2018 11:52 AM 8.3 (H) 4.8 - 5.6 % Final    Comment:             Prediabetes: 5.7 - 6.4          Diabetes: >6.4          Glycemic control for  adults with diabetes: <7.0     CBG: Recent Labs  Lab 05/18/19 1658 05/18/19 2119 05/19/19 0816 05/19/19 1221  GLUCAP 246* 278* 157* 172*     Scheduled Meds: . vitamin C  1,000 mg Oral Daily  . aspirin EC  81 mg Oral Daily  . atorvastatin  10 mg Oral Daily  . dexamethasone (DECADRON) injection  6 mg Intravenous Q24H  . enoxaparin (LOVENOX) injection  80 mg Subcutaneous Q24H  . ferrous sulfate  325 mg Oral Q breakfast  . fluticasone  2 spray Each Nare Daily  . furosemide  80 mg Oral BID  . insulin aspart  0-20 Units Subcutaneous TID WC  . insulin aspart  0-5 Units Subcutaneous QHS  . insulin aspart  6 Units Subcutaneous TID WC  . potassium chloride SA  20 mEq Oral BID  . sodium chloride flush  3 mL Intravenous Q12H   Continuous Infusions: . sodium chloride    . remdesivir 100 mg in NS 100 mL 100 mg (05/19/19 0928)     LOS: 1 day   Cherene Altes, MD [facility] Office  [phone] Pager - Text Page per Amion  If 7PM-7AM, please contact night-coverage per Amion 05/19/2019, 2:32 PM

## 2019-05-20 LAB — COMPREHENSIVE METABOLIC PANEL
ALT: 55 U/L — ABNORMAL HIGH (ref 0–44)
AST: 42 U/L — ABNORMAL HIGH (ref 15–41)
Albumin: 3 g/dL — ABNORMAL LOW (ref 3.5–5.0)
Alkaline Phosphatase: 96 U/L (ref 38–126)
Anion gap: 9 (ref 5–15)
BUN: 22 mg/dL — ABNORMAL HIGH (ref 6–20)
CO2: 31 mmol/L (ref 22–32)
Calcium: 8.4 mg/dL — ABNORMAL LOW (ref 8.9–10.3)
Chloride: 99 mmol/L (ref 98–111)
Creatinine, Ser: 0.69 mg/dL (ref 0.61–1.24)
GFR calc Af Amer: >60 mL/min (ref >60)
GFR calc non Af Amer: >60 mL/min (ref >60)
Glucose, Bld: 184 mg/dL — ABNORMAL HIGH (ref 70–99)
Potassium: 4.5 mmol/L (ref 3.5–5.1)
Sodium: 139 mmol/L (ref 135–145)
Total Bilirubin: 0.6 mg/dL (ref 0.3–1.2)
Total Protein: 6.6 g/dL (ref 6.5–8.1)

## 2019-05-20 LAB — CBC WITH DIFFERENTIAL/PLATELET
Abs Immature Granulocytes: 0.16 10*3/uL — ABNORMAL HIGH (ref 0.00–0.07)
Basophils Absolute: 0.2 10*3/uL — ABNORMAL HIGH (ref 0.0–0.1)
Basophils Relative: 0 %
Eosinophils Absolute: 0 10*3/uL (ref 0.0–0.5)
Eosinophils Relative: 0 %
HCT: 36.9 % — ABNORMAL LOW (ref 39.0–52.0)
Hemoglobin: 11 g/dL — ABNORMAL LOW (ref 13.0–17.0)
Immature Granulocytes: 0 %
Lymphocytes Relative: 92 %
Lymphs Abs: 64.6 10*3/uL — ABNORMAL HIGH (ref 0.7–4.0)
MCH: 26.8 pg (ref 26.0–34.0)
MCHC: 29.8 g/dL — ABNORMAL LOW (ref 30.0–36.0)
MCV: 89.8 fL (ref 80.0–100.0)
Monocytes Absolute: 1 10*3/uL (ref 0.1–1.0)
Monocytes Relative: 1 %
Neutro Abs: 5.2 10*3/uL (ref 1.7–7.7)
Neutrophils Relative %: 7 %
Platelets: 175 10*3/uL (ref 150–400)
RBC: 4.11 MIL/uL — ABNORMAL LOW (ref 4.22–5.81)
RDW: 15 % (ref 11.5–15.5)
WBC Morphology: ABNORMAL
WBC: 71.1 10*3/uL (ref 4.0–10.5)
nRBC: 0 % (ref 0.0–0.2)

## 2019-05-20 LAB — C-REACTIVE PROTEIN: CRP: 5.4 mg/dL — ABNORMAL HIGH (ref <1.0)

## 2019-05-20 LAB — GLUCOSE, CAPILLARY
Glucose-Capillary: 156 mg/dL — ABNORMAL HIGH (ref 70–99)
Glucose-Capillary: 174 mg/dL — ABNORMAL HIGH (ref 70–99)
Glucose-Capillary: 204 mg/dL — ABNORMAL HIGH (ref 70–99)
Glucose-Capillary: 341 mg/dL — ABNORMAL HIGH (ref 70–99)

## 2019-05-20 LAB — D-DIMER, QUANTITATIVE: D-Dimer, Quant: 1.22 ug/mL-FEU — ABNORMAL HIGH (ref 0.00–0.50)

## 2019-05-20 LAB — PATHOLOGIST SMEAR REVIEW

## 2019-05-20 LAB — FERRITIN: Ferritin: 635 ng/mL — ABNORMAL HIGH (ref 24–336)

## 2019-05-20 LAB — MAGNESIUM: Magnesium: 2.1 mg/dL (ref 1.7–2.4)

## 2019-05-20 NOTE — Progress Notes (Signed)
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 Able to ambulate @ 160 ft then complete grooming @ sink level on 4L with desat to 86. Quickly rebounds to 90s. Pt completing incentive spirometer ( pulls 1250 ml) and flutter valve. Will follow acutely to facilitate safe DC home.     05/20/19 1350  OT Visit Information  Last OT Received On 05/20/19  Assistance Needed +1  History of Present Illness 59 year old male admitted with COVID/acute hypoxemia respiratory failure. PMH includes DM, OSA with CPCP, remote DVT, admitted via [facility]. He is noted to also be obese.  Precautions  Precautions Fall  Restrictions  Weight Bearing Restrictions No  Home Living  Family/patient expects to be discharged to: Private residence  Available Help at Discharge Friend(s);Available 24 hours/day  Type of Home House  Bathroom Accessibility Yes  Additional Comments Did not use any AD for ambulation and his mother (they live in home) is currently hospitalized at [facility].  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 He does exercise routinely including swimming at the [facility].  Communication  Communication No difficulties  Pain Assessment  Pain Assessment No/denies pain  Cognition  Arousal/Alertness Awake/alert  Behavior During Therapy WFL for tasks assessed/performed  Overall Cognitive Status Within Functional Limits for tasks assessed  Upper Extremity Assessment  Upper Extremity Assessment Generalized weakness  Lower Extremity Assessment  Lower Extremity Assessment Defer to PT evaluation  Cervical / Trunk Assessment  Cervical / Trunk Assessment Other exceptions (morbid obesity)  ADL  Overall ADL's  Independent  General ADL Comments Pt uses AE to assist with dressing  Bed Mobility   Overal bed mobility Independent  Transfers  Overall transfer level Independent  Balance  Overall balance assessment No apparent balance deficits (not formally assessed)  Exercises  Exercises Other exercises  Other Exercises  Other Exercises flutter valve x 10  Other Exercises incentive spirometer x 10  OT - End of Session  Equipment Utilized During Treatment Oxygen (4L)  Activity Tolerance Patient tolerated treatment well  Patient left in chair;with call bell/phone within reach  Nurse Communication Mobility status  OT Assessment  OT Recommendation/Assessment Patient needs continued OT Services  OT Visit Diagnosis Muscle weakness (generalized) (M62.81)  OT Problem List Decreased activity tolerance;Decreased knowledge of use of DME or AE;Cardiopulmonary status limiting activity;Obesity  OT Plan  OT Frequency (ACUTE ONLY) Min 2X/week  OT Treatment/Interventions (ACUTE ONLY) Self-care/ADL training;Therapeutic exercise;Energy conservation;DME and/or AE instruction;Therapeutic activities;Patient/family education  AM-PAC OT "6 Clicks" Daily Activity Outcome Measure (Version 2)  Help from another person eating meals? 4  Help from another person taking care of personal grooming? 4  Help from another person toileting, which includes using toliet, bedpan, or urinal? 4  Help from another person bathing (including washing, rinsing, drying)? 3  Help from another person to put on and taking off regular upper body clothing? 4  Help from another person to put on and taking off regular lower body clothing? 3  6 Click Score 22  OT Recommendation  Follow Up Recommendations No OT follow up  OT Equipment Tub/shower seat (bariatric)  Individuals Consulted  Consulted and Agree with Results and Recommendations Patient  Acute Rehab OT Goals  Patient Stated Goal to get better and go home  OT Goal Formulation With patient  Time For Goal Achievement 06/03/19  Potential to Achieve Goals Good  OT Time  Calculation  OT Start Time (ACUTE ONLY) 1130  OT Stop Time (ACUTE ONLY) 1205  OT Time Calculation (min) 35 min  OT General Charges  $OT Visit 1 Visit  OT Evaluation  $OT Eval Moderate Complexity 1 Mod  OT Treatments  $Self Care/Home Management  8-22 mins  Written Expression  Dominant Hand Right  Maurie Boettcher, OT/L   Acute OT Clinical Specialist Acute Rehabilitation Services Pager [phone] Office [phone]

## 2019-05-20 NOTE — Progress Notes (Signed)
Patient arrived to room 317. VSS. Patient bathing upon this nurse entering room. No signs of distress. O2 re-applied.  05/20/19 1850  Patient Belongings  Patient/Family advised about valuables policy? Yes  Belongings at Bedside Cell phone;Clothing;Glasses Financial risk analyst, slippers)  Belongings Sent Home None  Belongings Sent to Safe None  Home Medications Not applicable

## 2019-05-20 NOTE — Progress Notes (Signed)
Henry Hinton  [id number] DOB: Jan 28, 1961 DOA: 05/18/2019 PCP: [name], MD    Brief Narrative:  59 year old with a history of morbid obesity, DM, OSA on CPAP, CLL, and remote DVT who presented to the ED with known Covid positive status complaining of shortness of breath.  He tested positive for Covid 1/13, but had symptoms for approximately 1 week prior to his ED presentation.  He was found to have an oxygen saturation of 85% and was admitted via the [facility] ED.  Significant Events: 1/13 Covid test positive 1/17 admit via [facility] -[facility] to [facility]  COVID-19 specific Treatment: Decadron 1/17 > Remdesivir 1/17 > 11/21  Antimicrobials:  None  Subjective: Continues to require oxygen support, but overall is improving. Denies cp, n/v, or abdom pain.   Assessment & Plan:  COVID Pneumonia -acute hypoxic respiratory failure continue remdesivir and Decadron - given relative clinical stability at present will hold on Actemra or baricitinib - mobilize - encouraged IS - to get dose 4 of Remdesivir in AM then re-assess ambulatory sats - if feeling good w/ ambulation and not requiring too much O2 could d/c home 1/20 w/ home O2 and f/u in outpt clinic for 5th dose of Remdesivir in the infusion clinic, but if not stable on feet or desaturating signif would have low threshold to extend hospitalization to 1/21  Recent Labs  Lab 05/18/19 0920 05/18/19 2100 05/19/19 1006 05/19/19 1016 05/20/19 0426  DDIMER 2.46*  --  1.75*  --  1.22*  FERRITIN  --   --   --  622*  --   CRP 10.4*  --   --  9.4*  --   ALT 35  --  48*  --  67*  PROCALCITON  --  <0.10  --   --   --     DM 2 CBG variable but overall reasonably controlled at this time - follow trend w/ SSI and adjust insulin as necessary  Hypokalemia  Corrected with supplementation -magnesium is normal  CLL Admitting MD discussed with patient's Hematologist who recommended no specific changes in treatment at  this time  Remote history of DVT Was treated with 6 months of Xarelto - on DVT prophy dosing now   Mild transaminitis  Due to COVID +/- remdesivir -LFTs stable at present  Morbid obesity - Estimated body mass index is 53.61 kg/m as calculated from the following:   Height as of this encounter: 5\' 9"  (1.753 m).   Weight as of this encounter: 164.7 kg.  OSA On nightly CPAP at home  DVT prophylaxis: Lovenox Code Status: FULL CODE Family Communication:  Disposition Plan: med/surg - wean O2 - ambulate  Consultants:  none  Objective: Blood pressure 121/87, pulse 63, temperature 98.6 F (37 C), temperature source Oral, resp. rate 14, height 5\' 9"  (1.753 m), weight (!) 164.7 kg, SpO2 90 %.  Intake/Output Summary (Last 24 hours) at 05/20/2019 0818 Last data filed at 05/19/2019 1800 Gross per 24 hour  Intake 980 ml  Output 700 ml  Net 280 ml   Filed Weights   05/18/19 0821  Weight: (!) 164.7 kg    Examination: General: No acute respiratory distress Lungs: fine crackles B bases w/o change  Cardiovascular: RRR Abdomen: Nontender, morbidly obese, soft, bowel sounds positive Extremities: trace chronic appearing edema B LE    CBC: Recent Labs  Lab 05/18/19 0920 05/19/19 1006 05/20/19 0426  WBC 70.2* 66.2* 71.1*  NEUTROABS 4.8 4.9 5.2  HGB  11.4* 11.4* 11.0*  HCT 37.5* 37.8* 36.9*  MCV 90.1 89.2 89.8  PLT 136* 149* 0000000   Basic Metabolic Panel: Recent Labs  Lab 05/18/19 0920 05/19/19 1006 05/20/19 0426  NA 136 138 139  K 3.8 3.2* 4.5  CL 97* 98 99  CO2 27 29 31   GLUCOSE 189* 222* 184*  BUN 11 20 22*  CREATININE 0.79 0.76 0.69  CALCIUM 8.2* 8.3* 8.4*  MG  --  2.0 2.1   GFR: Estimated Creatinine Clearance: 154.2 mL/min (by C-G formula based on SCr of 0.69 mg/dL).  Liver Function Tests: Recent Labs  Lab 05/18/19 0920 05/19/19 1006 05/20/19 0426  AST 37 44* 42*  ALT 35 48* 55*  ALKPHOS 87 95 96  BILITOT 0.9 0.5 0.6  PROT 6.4* 7.0 6.6  ALBUMIN 2.8*  3.1* 3.0*    HbA1C: Hgb A1c MFr Bld  Date/Time Value Ref Range Status  03/20/2019 10:10 AM 8.4 (H) 4.8 - 5.6 % Final    Comment:             Prediabetes: 5.7 - 6.4          Diabetes: >6.4          Glycemic control for adults with diabetes: <7.0   12/25/2018 11:52 AM 8.3 (H) 4.8 - 5.6 % Final    Comment:             Prediabetes: 5.7 - 6.4          Diabetes: >6.4          Glycemic control for adults with diabetes: <7.0     CBG: Recent Labs  Lab 05/19/19 0816 05/19/19 1221 05/19/19 1727 05/19/19 2051 05/20/19 0754  GLUCAP 157* 172* 164* 397* 156*     Scheduled Meds: . vitamin C  1,000 mg Oral Daily  . aspirin EC  81 mg Oral Daily  . atorvastatin  10 mg Oral Daily  . dexamethasone (DECADRON) injection  6 mg Intravenous Q24H  . enoxaparin (LOVENOX) injection  80 mg Subcutaneous Q24H  . ferrous sulfate  325 mg Oral Q breakfast  . fluticasone  2 spray Each Nare Daily  . furosemide  80 mg Oral BID  . insulin aspart  0-20 Units Subcutaneous TID WC  . insulin aspart  0-5 Units Subcutaneous QHS  . insulin aspart  6 Units Subcutaneous TID WC  . potassium chloride  40 mEq Oral BID  . sodium chloride flush  3 mL Intravenous Q12H   Continuous Infusions: . sodium chloride    . remdesivir 100 mg in NS 100 mL 100 mg (05/19/19 0928)     LOS: 2 days   Cherene Altes, MD [facility] Office  [phone] Pager - Text Page per Amion  If 7PM-7AM, please contact night-coverage per Amion 05/20/2019, 8:18 AM

## 2019-05-20 NOTE — Evaluation (Signed)
Physical Therapy Evaluation Patient Details Name: Henry Hinton MRN: [id number] DOB: 1960/10/20 Today's Date: 05/20/2019   History of Present Illness  59 year old male admitted with COVID/acute hypoxemia respiratory failure. PMH includes DM, OSA with CPCP, remote DVT, admitted via [facility]. He is noted to also be obese.  Clinical Impression  Very pleasant male patient , receptive to PT. He is currently on oxygen , but was not on oxygen at home. Tends to desat into the 80s with min to mod exertion. He relates that he lives with his mother in a one level home- she currently is hospitalized. 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 Relates that he is normally independent in all ADLs. He is generally weak with reduced energy levels. Uses no AD for ambulation. He enjoys swimming and states he does so routinely at the [facility]. Should benefit from PT follow up while here to further address goals in accordance with PLOF.     Follow Up Recommendations      Equipment Recommendations       Recommendations for Other Services       Precautions / Restrictions Precautions Precautions: Fall Restrictions Weight Bearing Restrictions: No      Mobility  Bed Mobility Overal bed mobility: Independent                Transfers Overall transfer level: Independent Equipment used: None                Ambulation/Gait Ambulation/Gait assistance: Supervision Gait Distance (Feet): 28 Feet Assistive device: (close supervision) Gait Pattern/deviations: Wide base of support;Shuffle   Gait velocity interpretation: >2.62 ft/sec, indicative of community ambulatory    Stairs            Wheelchair Mobility    Modified Rankin (Stroke Patients Only)       Balance                                             Pertinent Vitals/Pain Pain Assessment: No/denies pain    Home Living Family/patient expects to be discharged to:: Private  residence Living Arrangements: Other relatives Available Help at Discharge: Friend(s);Available 24 hours/day Type of Home: House Home Access: Stairs to enter Entrance Stairs-Rails: Right Entrance Stairs-Number of Steps: 3 Home Layout: One level Home Equipment: Walker - 2 wheels Additional Comments: Did not use any AD for ambulation and his mother (they live in home) is currently hospitalized at [facility].    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 He does exercise routinely including swimming at the [facility].     Hand Dominance   Dominant Hand: Right    Extremity/Trunk Assessment   Upper Extremity Assessment Upper Extremity Assessment: Defer to OT evaluation    Lower Extremity Assessment Lower Extremity Assessment: Defer to PT evaluation;Generalized weakness    Cervical / Trunk Assessment Cervical / Trunk Assessment: Normal  Communication   Communication: No difficulties  Cognition Arousal/Alertness: Awake/alert Behavior During Therapy: WFL for tasks assessed/performed Overall Cognitive Status: Within Functional Limits for tasks assessed  General Comments      Exercises General Exercises - Lower Extremity Ankle Circles/Pumps: AROM;Seated Quad Sets: AROM;Seated Short Arc Quad: AROM;Seated Long Arc Quad: AROM;Seated Hip ABduction/ADduction: AROM;Seated Straight Leg Raises: AROM;Seated Hip Flexion/Marching: AROM;Seated Other Exercises Other Exercises: Practiced IS and Flutter unit. Able to achieve 1000 with good control for 7 out of 9 attempts. Good return demo with Flutter valve Other Exercises: incentive spirometer x 10   Assessment/Plan    PT Assessment Patient needs continued PT services  PT Problem List Decreased strength;Cardiopulmonary status limiting activity;Decreased activity  tolerance       PT Treatment Interventions Gait training;Functional mobility training;Patient/family education;Therapeutic activities;Therapeutic exercise    PT Goals (Current goals can be found in the Care Plan section)  Acute Rehab PT Goals Patient Stated Goal: Just want to get home as soon as I safely can- PT Goal Formulation: With patient Time For Goal Achievement: 06/03/19 Potential to Achieve Goals: Good    Frequency Min 2X/week   Barriers to discharge        Co-evaluation               AM-PAC PT "6 Clicks" Mobility  Outcome Measure Help needed turning from your back to your side while in a flat bed without using bedrails?: None Help needed moving from lying on your back to sitting on the side of a flat bed without using bedrails?: None Help needed moving to and from a bed to a chair (including a wheelchair)?: None Help needed standing up from a chair using your arms (e.g., wheelchair or bedside chair)?: A Little Help needed to walk in hospital room?: None Help needed climbing 3-5 steps with a railing? : A Lot 6 Click Score: 21    End of Session   Activity Tolerance: Patient tolerated treatment well Patient left: in chair   PT Visit Diagnosis: Muscle weakness (generalized) (M62.81)    Time: 0100-0150 PT Time Calculation (min) (ACUTE ONLY): 50 min   Charges:   PT Evaluation $PT Eval Moderate Complexity: 1 Mod PT Treatments $Gait Training: 8-22 mins $Therapeutic Exercise: 8-22 mins        Rollen Sox, PT # [phone] CGV cell   Casandra Doffing 05/20/2019, 2:01 PM

## 2019-05-21 DIAGNOSIS — J1282 Pneumonia due to coronavirus disease 2019: Secondary | ICD-10-CM

## 2019-05-21 LAB — CBC WITH DIFFERENTIAL/PLATELET
Abs Immature Granulocytes: 0.21 10*3/uL — ABNORMAL HIGH (ref 0.00–0.07)
Basophils Absolute: 0.1 10*3/uL (ref 0.0–0.1)
Basophils Relative: 0 %
Eosinophils Absolute: 0 10*3/uL (ref 0.0–0.5)
Eosinophils Relative: 0 %
HCT: 37.8 % — ABNORMAL LOW (ref 39.0–52.0)
Hemoglobin: 11.3 g/dL — ABNORMAL LOW (ref 13.0–17.0)
Immature Granulocytes: 0 %
Lymphocytes Relative: 91 %
Lymphs Abs: 68.2 10*3/uL — ABNORMAL HIGH (ref 0.7–4.0)
MCH: 26.7 pg (ref 26.0–34.0)
MCHC: 29.9 g/dL — ABNORMAL LOW (ref 30.0–36.0)
MCV: 89.2 fL (ref 80.0–100.0)
Monocytes Absolute: 0.8 10*3/uL (ref 0.1–1.0)
Monocytes Relative: 1 %
Neutro Abs: 5.8 10*3/uL (ref 1.7–7.7)
Neutrophils Relative %: 8 %
Platelets: 207 10*3/uL (ref 150–400)
RBC: 4.24 MIL/uL (ref 4.22–5.81)
RDW: 15.1 % (ref 11.5–15.5)
WBC: 75 10*3/uL (ref 4.0–10.5)
nRBC: 0 % (ref 0.0–0.2)

## 2019-05-21 LAB — COMPREHENSIVE METABOLIC PANEL
ALT: 52 U/L — ABNORMAL HIGH (ref 0–44)
AST: 39 U/L (ref 15–41)
Albumin: 3.1 g/dL — ABNORMAL LOW (ref 3.5–5.0)
Alkaline Phosphatase: 96 U/L (ref 38–126)
Anion gap: 8 (ref 5–15)
BUN: 19 mg/dL (ref 6–20)
CO2: 29 mmol/L (ref 22–32)
Calcium: 8.1 mg/dL — ABNORMAL LOW (ref 8.9–10.3)
Chloride: 101 mmol/L (ref 98–111)
Creatinine, Ser: 0.72 mg/dL (ref 0.61–1.24)
GFR calc Af Amer: >60 mL/min (ref >60)
GFR calc non Af Amer: >60 mL/min (ref >60)
Glucose, Bld: 167 mg/dL — ABNORMAL HIGH (ref 70–99)
Potassium: 4.6 mmol/L (ref 3.5–5.1)
Sodium: 138 mmol/L (ref 135–145)
Total Bilirubin: 0.8 mg/dL (ref 0.3–1.2)
Total Protein: 6.6 g/dL (ref 6.5–8.1)

## 2019-05-21 LAB — GLUCOSE, CAPILLARY
Glucose-Capillary: 133 mg/dL — ABNORMAL HIGH (ref 70–99)
Glucose-Capillary: 140 mg/dL — ABNORMAL HIGH (ref 70–99)
Glucose-Capillary: 349 mg/dL — ABNORMAL HIGH (ref 70–99)

## 2019-05-21 LAB — C-REACTIVE PROTEIN: CRP: 3.1 mg/dL — ABNORMAL HIGH (ref <1.0)

## 2019-05-21 LAB — D-DIMER, QUANTITATIVE: D-Dimer, Quant: 1.22 ug/mL-FEU — ABNORMAL HIGH (ref 0.00–0.50)

## 2019-05-21 LAB — MAGNESIUM: Magnesium: 2 mg/dL (ref 1.7–2.4)

## 2019-05-21 LAB — FERRITIN: Ferritin: 482 ng/mL — ABNORMAL HIGH (ref 24–336)

## 2019-05-21 MED ORDER — BENZONATATE 100 MG PO CAPS: 100.0000 mg | ORAL_CAPSULE | Freq: Three times a day (TID) | ORAL | 0 refills | Status: DC | PRN

## 2019-05-21 MED ORDER — DEXAMETHASONE 2 MG PO TABS: ORAL_TABLET | ORAL | 0 refills | Status: DC

## 2019-05-21 NOTE — Telephone Encounter (Signed)
Pt currently inpatient.  

## 2019-05-21 NOTE — Progress Notes (Signed)
Patient scheduled for outpatient Remdesivir infusion at 10:00 am on Thursday 1/21.  Please advise them to report to [facility] at [address].  Drive to the security guard and tell them you are here for an infusion. They will direct you to the front entrance where we will come and get you.  For questions call [phone].  Thanks

## 2019-05-21 NOTE — Progress Notes (Signed)
Patient requests to have 4L nasal cannula during sleep due to not having his CPAP.

## 2019-05-21 NOTE — Progress Notes (Signed)
Ambulation Note  Saturation Pre: 90% on 4L  Ambulation Distance: 230 ft  Saturation During Ambulation: 85-90% on 4L  Notes: Pt walked independently. Tolerated well. Pt returned to bed side with call bell within reach, sats were 89% on 4L.   Philis Kendall, MS, ACSM CEP 11:47 AM 05/21/2019

## 2019-05-21 NOTE — Progress Notes (Signed)
SATURATION QUALIFICATIONS: (This note is used to comply with regulatory documentation for home oxygen)  Patient Saturations on Room Air at Rest = 79%  Patient Saturations on Room Air while Ambulating = NA%. Did not attempt, pt was below 88% while resting  Patient Saturations on 4 Liters of oxygen while Ambulating = 85-90%  Please briefly explain why patient needs home oxygen: Pt needs supplemental oxygen to maintain saturations at 88% and above while at rest and ambulating.

## 2019-05-21 NOTE — Discharge Summary (Signed)
[facility]  Physician Discharge Summary   Patient ID: Henry Hinton MRN: [id number] DOB/AGE: 1960-06-04 59 y.o.  Admit date: 05/18/2019 Discharge date: 05/21/2019  PCP: [name], MD  DISCHARGE DIAGNOSES:  Pneumonia due to COVID-19 Acute respiratory failure with hypoxia Diabetes mellitus type 2 History of CLL Remote history of DVT Mild transaminitis Morbid obesity Obstructive sleep apnea  RECOMMENDATIONS FOR OUTPATIENT FOLLOW UP: 1. Home oxygen has been ordered    Home Health: None Equipment/Devices: Home oxygen  CODE STATUS: Full code  DISCHARGE CONDITION: fair  Diet recommendation: Modified carbohydrate  INITIAL HISTORY: 59 year old with a history of morbid obesity, DM, OSA on CPAP, CLL, and remote DVT who presented to the ED with known Covid positive status complaining of shortness of breath.  He tested positive for Covid 1/13, but had symptoms for approximately 1 week prior to his ED presentation.  He was found to have an oxygen saturation of 85% and was admitted via the [facility] ED.    HOSPITAL COURSE:   Pneumonia due to COVID-19/acute respiratory failure with hypoxia Patient was started on remdesivir and steroids.  Due to clinical stability patient was not given Actemra or baricitinib.  Was not given plasma either.  His inflammatory markers improved.  He started feeling better he.  Oxygen requirements improved to 4 L.  He started ambulating.  Plan was for the patient to come back tomorrow to the outpatient clinic for remdesivir.  He was considered stable for discharge today.  Home oxygen was ordered.    Diabetes mellitus type 2, uncontrolled with hyperglycemia Elevated CBGs due to steroids.  HbA1c 8.4 in November.  May resume his usual home medication regimen.  Hypokalemia  Corrected with supplementation.  CLL Elevated WBC noted.  Admitting MD discussed with patient's Hematologist who recommended no specific changes in treatment at this  time.  Outpatient follow-up  Remote history of DVT Was treated with 6 months of Xarelto.  Not on anticoagulation currently.  Mild transaminitis  Due to COVID +/- remdesivir.  Morbid obesity Estimated body mass index is 53.61 kg/m as calculated from the following:   Height as of this encounter: 5\' 9"  (1.753 m).   Weight as of this encounter: 164.7 kg.  OSA On nightly CPAP at home  Patient was stable for discharge home today.  He was very keen on going home.  Initially he thought that he could go home today and come back for his fifth dose of remdesivir tomorrow.  However after the discharge orders were placed, he found out that he will be unable to come back tomorrow.  So the patient will stay 1 extra day in the hospital and then he will get his fifth dose of remdesivir tomorrow and should be able to go home subsequently.       PERTINENT LABS:  The results of significant diagnostics from this hospitalization (including imaging, microbiology, ancillary and laboratory) are listed below for reference.     Labs:    Basic Metabolic Panel: Recent Labs  Lab 05/18/19 0920 05/19/19 1006 05/20/19 0426 05/21/19 0640  NA 136 138 139 138  K 3.8 3.2* 4.5 4.6  CL 97* 98 99 101  CO2 27 29 31 29   GLUCOSE 189* 222* 184* 167*  BUN 11 20 22* 19  CREATININE 0.79 0.76 0.69 0.72  CALCIUM 8.2* 8.3* 8.4* 8.1*  MG  --  2.0 2.1 2.0   Liver Function Tests: Recent Labs  Lab 05/18/19 0920 05/19/19 1006 05/20/19 0426 05/21/19 0640  AST 37 44* 42* 39  ALT 35 48* 55* 52*  ALKPHOS 87 95 96 96  BILITOT 0.9 0.5 0.6 0.8  PROT 6.4* 7.0 6.6 6.6  ALBUMIN 2.8* 3.1* 3.0* 3.1*   CBC: Recent Labs  Lab 05/18/19 0920 05/19/19 1006 05/20/19 0426 05/21/19 0640  WBC 70.2* 66.2* 71.1* 75.0*  NEUTROABS 4.8 4.9 5.2 5.8  HGB 11.4* 11.4* 11.0* 11.3*  HCT 37.5* 37.8* 36.9* 37.8*  MCV 90.1 89.2 89.8 89.2  PLT 136* 149* 175 207    CBG: Recent Labs  Lab 05/20/19 1155 05/20/19 1607  05/20/19 2000 05/21/19 0747 05/21/19 1111  GLUCAP 174* 204* 341* 133* 140*     IMAGING STUDIES DG Chest Port 1 View  Result Date: 05/18/2019 CLINICAL DATA:  COVID positive.  Creasing short of breath EXAM: PORTABLE CHEST 1 VIEW COMPARISON:  None. FINDINGS: Normal cardiac silhouette. Low lung volumes. Patient rotated rightward. Cardiac leads bundle bundle over RIGHT chest. Potential nodular airspace disease in the RIGHT lower lobe. LEFT lung clear. IMPRESSION: Suboptimal radiograph. Concern for nodular airspace disease in the RIGHT lower lobe. Electronically Signed   By: Suzy Bouchard M.D.   On: 05/18/2019 09:56    DISCHARGE EXAMINATION: Vitals:   05/21/19 0100 05/21/19 0500 05/21/19 0750 05/21/19 1657  BP:  (!) 123/49 (!) 125/56 120/61  Pulse:  70 69   Resp: 20 20 20 18   Temp:  98.5 F (36.9 C) 98.4 F (36.9 C) 98.9 F (37.2 C)  TempSrc:  Oral Oral Oral  SpO2: 95% 94% 91% 92%  Weight:      Height:       General appearance: Awake alert.  In no distress Resp: Normal effort at rest.  Few crackles at the bases bilaterally.  No wheezing or rhonchi. Cardio: S1-S2 is normal regular.  No S3-S4.  No rubs murmurs or bruit GI: Abdomen is soft.  Nontender nondistended.  Bowel sounds are present normal.  No masses organomegaly Extremities: No edema.  Full range of motion of lower extremities. Neurologic: Alert and oriented x3.  No focal neurological deficits.    DISPOSITION: Home  Discharge Instructions    Call MD for:  difficulty breathing, headache or visual disturbances   Complete by: As directed    Call MD for:  extreme fatigue   Complete by: As directed    Call MD for:  persistant dizziness or light-headedness   Complete by: As directed    Call MD for:  persistant nausea and vomiting   Complete by: As directed    Call MD for:  severe uncontrolled pain   Complete by: As directed    Call MD for:  temperature >100.4   Complete by: As directed    Diet Carb Modified    Complete by: As directed    Discharge instructions   Complete by: As directed    COVID 19 INSTRUCTIONS  - You are felt to be stable enough to no longer require inpatient monitoring, testing, and treatment, though you will need to follow the recommendations below: - Based on the CDC's non-test criteria for ending self-isolation: You may not return to work/leave the home until at least 21 days since symptom onset AND 3 days without a fever (without taking tylenol, ibuprofen, etc.) AND have improvement in respiratory symptoms. - Do not take NSAID medications (including, but not limited to, ibuprofen, advil, motrin, naproxen, aleve, goody's powder, etc.) - Follow up with your doctor in the next week via telehealth or seek medical attention right away if  your symptoms get WORSE.  - Consider donating plasma after you have recovered (either 14 days after a negative test or 28 days after symptoms have completely resolved) because your antibodies to this virus may be helpful to give to others with life-threatening infections. Please go to the website www.[email or web address] if you would like to consider volunteering for plasma donation.    Directions for you at home:  Wear a facemask You should wear a facemask that covers your nose and mouth when you are in the same room with other people and when you visit a healthcare provider. People who live with or visit you should also wear a facemask while they are in the same room with you.  Separate yourself from other people in your home As much as possible, you should stay in a different room from other people in your home. Also, you should use a separate bathroom, if available.  Avoid sharing household items You should not share dishes, drinking glasses, cups, eating utensils, towels, bedding, or other items with other people in your home. After using these items, you should wash them thoroughly with soap and water.  Cover your coughs and sneezes Cover your  mouth and nose with a tissue when you cough or sneeze, or you can cough or sneeze into your sleeve. Throw used tissues in a lined trash can, and immediately wash your hands with soap and water for at least 20 seconds or use an alcohol-based hand rub.  Wash your [facility] your hands often and thoroughly with soap and water for at least 20 seconds. You can use an alcohol-based hand sanitizer if soap and water are not available and if your hands are not visibly dirty. Avoid touching your eyes, nose, and mouth with unwashed hands.  Directions for those who live with, or provide care at home for you:  Limit the number of people who have contact with the patient If possible, have only one caregiver for the patient. Other household members should stay in another home or place of residence. If this is not possible, they should stay in another room, or be separated from the patient as much as possible. Use a separate bathroom, if available. Restrict visitors who do not have an essential need to be in the home.  Ensure good ventilation Make sure that shared spaces in the home have good air flow, such as from an air conditioner or an opened window, weather permitting.  Wash your hands often Wash your hands often and thoroughly with soap and water for at least 20 seconds. You can use an alcohol based hand sanitizer if soap and water are not available and if your hands are not visibly dirty. Avoid touching your eyes, nose, and mouth with unwashed hands. Use disposable paper towels to dry your hands. If not available, use dedicated cloth towels and replace them when they become wet.  Wear a facemask and gloves Wear a disposable facemask at all times in the room and gloves when you touch or have contact with the patient's blood, body fluids, and/or secretions or excretions, such as sweat, saliva, sputum, nasal mucus, vomit, urine, or feces.  Ensure the mask fits over your nose and mouth tightly, and do  not touch it during use. Throw out disposable facemasks and gloves after using them. Do not reuse. Wash your hands immediately after removing your facemask and gloves. If your personal clothing becomes contaminated, carefully remove clothing and launder. Wash your hands after handling contaminated clothing. Place all  used disposable facemasks, gloves, and other waste in a lined container before disposing them with other household waste. Remove gloves and wash your hands immediately after handling these items.  Do not share dishes, glasses, or other household items with the patient Avoid sharing household items. You should not share dishes, drinking glasses, cups, eating utensils, towels, bedding, or other items with a patient who is confirmed to have, or being evaluated for, COVID-19 infection. After the person uses these items, you should wash them thoroughly with soap and water.  Wash laundry thoroughly Immediately remove and wash clothes or bedding that have blood, body fluids, and/or secretions or excretions, such as sweat, saliva, sputum, nasal mucus, vomit, urine, or feces, on them. Wear gloves when handling laundry from the patient. Read and follow directions on labels of laundry or clothing items and detergent. In general, wash and dry with the warmest temperatures recommended on the label.  Clean all areas the individual has used often Clean all touchable surfaces, such as counters, tabletops, doorknobs, bathroom fixtures, toilets, phones, keyboards, tablets, and bedside tables, every day. Also, clean any surfaces that may have blood, body fluids, and/or secretions or excretions on them. Wear gloves when cleaning surfaces the patient has come in contact with. Use a diluted bleach solution (e.g., dilute bleach with 1 part bleach and 10 parts water) or a household disinfectant with a label that says EPA-registered for coronaviruses. To make a bleach solution at home, add 1 tablespoon of  bleach to 1 quart (4 cups) of water. For a larger supply, add  cup of bleach to 1 gallon (16 cups) of water. Read labels of cleaning products and follow recommendations provided on product labels. Labels contain instructions for safe and effective use of the cleaning product including precautions you should take when applying the product, such as wearing gloves or eye protection and making sure you have good ventilation during use of the product. Remove gloves and wash hands immediately after cleaning.  Monitor yourself for signs and symptoms of illness Caregivers and household members are considered close contacts, should monitor their health, and will be asked to limit movement outside of the home to the extent possible. Follow the monitoring steps for close contacts listed on the symptom monitoring form.   If you have additional questions, contact your local health department or call the epidemiologist on call at [phone] (available 24/7). This guidance is subject to change. For the most up-to-date guidance from [facility], please refer to their website: YouBlogs.pl   You were cared for by a hospitalist during your hospital stay. If you have any questions about your discharge medications or the care you received while you were in the hospital after you are discharged, you can call the unit and asked to speak with the hospitalist on call if the hospitalist that took care of you is not available. Once you are discharged, your primary care physician will handle any further medical issues. Please note that NO REFILLS for any discharge medications will be authorized once you are discharged, as it is imperative that you return to your primary care physician (or establish a relationship with a primary care physician if you do not have one) for your aftercare needs so that they can reassess your need for medications and monitor your lab values.  If you do not have a primary care physician, you can call [phone] for a physician referral.   Increase activity slowly   Complete by: As directed    MyChart COVID-19 home  monitoring program   Complete by: May 21, 2019    Is the patient willing to use the [employer] for home monitoring?: Yes   Temperature monitoring   Complete by: May 21, 2019    After how many days would you like to receive a notification of this patient's flowsheet entries?: 1        Allergies as of 05/21/2019      Reactions   Adhesive [tape] Other (See Comments)   Blisters and rash at site      Medication List    TAKE these medications   aspirin EC 81 MG tablet Take 81 mg by mouth daily.   atorvastatin 10 MG tablet Commonly known as: LIPITOR Take 1 tablet (10 mg total) by mouth daily. What changed:   when to take this  additional instructions   benzonatate 100 MG capsule Commonly known as: Tessalon Perles Take 1 capsule (100 mg total) by mouth 3 (three) times daily as needed for cough.   dexamethasone 2 MG tablet Commonly known as: DECADRON Take 2 tablets once daily for 3 days, then 1 tablet once daily for 3 days, then STOP.   fluticasone 50 MCG/ACT nasal spray Commonly known as: FLONASE SPRAY 2 SPRAYS INTO EACH NOSTRIL EVERY DAY   furosemide 80 MG tablet Commonly known as: LASIX Take 1 tablet (80 mg total) by mouth 2 (two) times daily.   ibuprofen 200 MG tablet Commonly known as: ADVIL Take 600 mg by mouth every 6 (six) hours as needed for mild pain.   Iron Supplement 325 (65 FE) MG tablet Generic drug: ferrous sulfate Take 325 mg by mouth daily with breakfast.   metFORMIN 1000 MG tablet Commonly known as: GLUCOPHAGE TAKE 1 TABLET BY MOUTH 2 TIMES DAILY WITH A MEAL.   multivitamin with minerals tablet Take 1 tablet by mouth daily.   potassium chloride SA 20 MEQ tablet Commonly known as: Klor-Con M20 Take 1 tablet (20 mEq total) by mouth 2 (two) times daily.   Trulicity  [id number] [id number] Sopn Generic drug: Dulaglutide Inject 0.75 mg into the skin once a week. What changed: additional instructions   vitamin C 1000 MG tablet Take 1,000 mg by mouth daily.            Durable Medical Equipment  (From admission, onward)         Start     Ordered   05/21/19 1226  For home use only DME oxygen  Once    Question Answer Comment  Length of Need 6 Months   Mode or (Route) Nasal cannula   Liters per Minute 4   Frequency Continuous (stationary and portable oxygen unit needed)   Oxygen conserving device Yes   Oxygen delivery system Gas      05/21/19 1225           Follow-up Information    [name], MD. Schedule an appointment as soon as possible for a visit in 2 week(s).   Specialties: Family Medicine, Sports Medicine Contact information: [place] [id number] [place] Follow up on 05/22/2019.   Specialty: Internal Medicine Why: Signed        Patient scheduled for outpatient Remdesivir infusion at 10:00 am on Thursday 1/21.  Please advise them to report to [facility] at [address]  Drive to the security guard and tell them you are here for an infusion. They will  di [employer] information: [place] [phone]          TOTAL DISCHARGE TIME: 37 minutes  [facility] Pager on [email or web address]  05/21/2019, 5:18 PM

## 2019-05-21 NOTE — Progress Notes (Signed)
   05/21/19 1325  Family/Significant Other Communication  Family/Significant Other Update Called (No answer. Mom Fraser Din)

## 2019-05-22 ENCOUNTER — Ambulatory Visit (HOSPITAL_COMMUNITY): Payer: BC Managed Care – PPO

## 2019-05-22 LAB — GLUCOSE, CAPILLARY
Glucose-Capillary: 265 mg/dL — ABNORMAL HIGH (ref 70–99)
Glucose-Capillary: 151 mg/dL — ABNORMAL HIGH (ref 70–99)
Glucose-Capillary: 164 mg/dL — ABNORMAL HIGH (ref 70–99)
Glucose-Capillary: 221 mg/dL — ABNORMAL HIGH (ref 70–99)

## 2019-05-22 LAB — MAGNESIUM: Magnesium: 2.2 mg/dL (ref 1.7–2.4)

## 2019-05-22 NOTE — Progress Notes (Signed)
Physical Therapy Treatment Patient Details Name: Henry Hinton MRN: [id number] DOB: [birth year] Today's Date: 05/22/2019    History of Present Illness 59 year old male admitted with COVID/acute hypoxemia respiratory failure. PMH includes DM, OSA with CPCP, remote DVT, admitted via [facility]. He is noted to also be obese.    PT Comments    Pt with improving activity tolerance. Pt on 4L of O2 with SpO2 93%. Amb on 4L O2 and at 200' SpO2 89%. After 350' SpO2 reading 80% on rt hand with dyspnea 2/4. Switched probe to lt hand and reading immediately to 86% so question accuracy of 80% reading. Pt hopeful to be discharged home this PM. Pt understands to perform activity in short bursts frequently. Pt understands he will need O2 at home and likely will require higher O2 with activity.    Follow Up Recommendations  No PT follow up     Equipment Recommendations  None recommended by PT    Recommendations for Other Services       Precautions / Restrictions Precautions Precautions: Fall Restrictions Weight Bearing Restrictions: No    Mobility  Bed Mobility Overal bed mobility: Independent                Transfers Overall transfer level: Independent                  Ambulation/Gait Ambulation/Gait assistance: Supervision Gait Distance (Feet): 350 Feet Assistive device: None Gait Pattern/deviations: Wide base of support;Decreased stride length Gait velocity: normal Gait velocity interpretation: >2.62 ft/sec, indicative of community ambulatory General Gait Details: Steady gait with supervision to monitor O2. At rest pt 93% on 4L. With amb on 4L pt with SpO2 initially reading 80% with dyspnea 2/4. Switched to lt hand and SpO2 immediately 86%.    Stairs             Wheelchair Mobility    Modified Rankin (Stroke Patients Only)       Balance Overall balance assessment: No apparent balance deficits (not formally assessed)                                           Cognition Arousal/Alertness: Awake/alert Behavior During Therapy: WFL for tasks assessed/performed Overall Cognitive Status: Within Functional Limits for tasks assessed                                        Exercises      General Comments        Pertinent Vitals/Pain Pain Assessment: No/denies pain    Home Living                      Prior Function            PT Goals (current goals can now be found in the care plan section) Acute Rehab PT Goals Patient Stated Goal: return to swimming and other activities Progress towards PT goals: Progressing toward goals    Frequency    Min 2X/week      PT Plan      Co-evaluation              AM-PAC PT "6 Clicks" Mobility   Outcome Measure  Help needed turning from your back to your side while in a flat  bed without using bedrails?: None Help needed moving from lying on your back to sitting on the side of a flat bed without using bedrails?: None Help needed moving to and from a bed to a chair (including a wheelchair)?: None Help needed standing up from a chair using your arms (e.g., wheelchair or bedside chair)?: None Help needed to walk in hospital room?: None Help needed climbing 3-5 steps with a railing? : None 6 Click Score: 24    End of Session Equipment Utilized During Treatment: Oxygen Activity Tolerance: Patient tolerated treatment well Patient left: in chair;with call bell/phone within reach   PT Visit Diagnosis: Other abnormalities of gait and mobility (R26.89)     Time: [id number] PT Time Calculation (min) (ACUTE ONLY): 15 min  Charges:  $Gait Training: 8-22 mins                     [facility] Pager [phone] [place] 05/22/2019, 2:47 PM

## 2019-05-22 NOTE — Progress Notes (Signed)
sats on 2L 82%, increased oxygen to 6L HFNC, sats 89%

## 2019-05-22 NOTE — Discharge Instructions (Signed)
COVID-19 COVID-19 is a respiratory infection that is caused by a virus called severe acute respiratory syndrome coronavirus 2 (SARS-CoV-2). The disease is also known as coronavirus disease or novel coronavirus. In some people, the virus may not cause any symptoms. In others, it may cause a serious infection. The infection can get worse quickly and can lead to complications, such as:  Pneumonia, or infection of the lungs.  Acute respiratory distress syndrome or ARDS. This is a condition in which fluid build-up in the lungs prevents the lungs from filling with air and passing oxygen into the blood.  Acute respiratory failure. This is a condition in which there is not enough oxygen passing from the lungs to the body or when carbon dioxide is not passing from the lungs out of the body.  Sepsis or septic shock. This is a serious bodily reaction to an infection.  Blood clotting problems.  Secondary infections due to bacteria or fungus.  Organ failure. This is when your body's organs stop working. The virus that causes COVID-19 is contagious. This means that it can spread from person to person through droplets from coughs and sneezes (respiratory secretions). What are the causes? This illness is caused by a virus. You may catch the virus by:  Breathing in droplets from an infected person. Droplets can be spread by a person breathing, speaking, singing, coughing, or sneezing.  Touching something, like a table or a doorknob, that was exposed to the virus (contaminated) and then touching your mouth, nose, or eyes. What increases the risk? Risk for infection You are more likely to be infected with this virus if you:  Are within 6 feet (2 meters) of a person with COVID-19.  Provide care for or live with a person who is infected with COVID-19.  Spend time in crowded indoor spaces or live in shared housing. Risk for serious illness You are more likely to become seriously ill from the virus if you:   Are 50 years of age or older. The higher your age, the more you are at risk for serious illness.  Live in a nursing home or long-term care facility.  Have cancer.  Have a long-term (chronic) disease such as: ? Chronic lung disease, including chronic obstructive pulmonary disease or asthma. ? A long-term disease that lowers your body's ability to fight infection (immunocompromised). ? Heart disease, including heart failure, a condition in which the arteries that lead to the heart become narrow or blocked (coronary artery disease), a disease which makes the heart muscle thick, weak, or stiff (cardiomyopathy). ? Diabetes. ? Chronic kidney disease. ? Sickle cell disease, a condition in which red blood cells have an abnormal "sickle" shape. ? Liver disease.  Are obese. What are the signs or symptoms? Symptoms of this condition can range from mild to severe. Symptoms may appear any time from 2 to 14 days after being exposed to the virus. They include:  A fever or chills.  A cough.  Difficulty breathing.  Headaches, body aches, or muscle aches.  Runny or stuffy (congested) nose.  A sore throat.  New loss of taste or smell. Some people may also have stomach problems, such as nausea, vomiting, or diarrhea. Other people may not have any symptoms of COVID-19. How is this diagnosed? This condition may be diagnosed based on:  Your signs and symptoms, especially if: ? You live in an area with a COVID-19 outbreak. ? You recently traveled to or from an area where the virus is common. ? You   provide care for or live with a person who was diagnosed with COVID-19. ? You were exposed to a person who was diagnosed with COVID-19.  A physical exam.  Lab tests, which may include: ? Taking a sample of fluid from the back of your nose and throat (nasopharyngeal fluid), your nose, or your throat using a swab. ? A sample of mucus from your lungs (sputum). ? Blood tests.  Imaging tests, which  may include, X-rays, CT scan, or ultrasound. How is this treated? At present, there is no medicine to treat COVID-19. Medicines that treat other diseases are being used on a trial basis to see if they are effective against COVID-19. Your health care provider will talk with you about ways to treat your symptoms. For most people, the infection is mild and can be managed at home with rest, fluids, and over-the-counter medicines. Treatment for a serious infection usually takes places in a hospital intensive care unit (ICU). It may include one or more of the following treatments. These treatments are given until your symptoms improve.  Receiving fluids and medicines through an IV.  Supplemental oxygen. Extra oxygen is given through a tube in the nose, a face mask, or a hood.  Positioning you to lie on your stomach (prone position). This makes it easier for oxygen to get into the lungs.  Continuous positive airway pressure (CPAP) or bi-level positive airway pressure (BPAP) machine. This treatment uses mild air pressure to keep the airways open. A tube that is connected to a motor delivers oxygen to the body.  Ventilator. This treatment moves air into and out of the lungs by using a tube that is placed in your windpipe.  Tracheostomy. This is a procedure to create a hole in the neck so that a breathing tube can be inserted.  Extracorporeal membrane oxygenation (ECMO). This procedure gives the lungs a chance to recover by taking over the functions of the heart and lungs. It supplies oxygen to the body and removes carbon dioxide. Follow these instructions at home: Lifestyle  If you are sick, stay home except to get medical care. Your health care provider will tell you how long to stay home. Call your health care provider before you go for medical care.  Rest at home as told by your health care provider.  Do not use any products that contain nicotine or tobacco, such as cigarettes, e-cigarettes, and  chewing tobacco. If you need help quitting, ask your health care provider.  Return to your normal activities as told by your health care provider. Ask your health care provider what activities are safe for you. General instructions  Take over-the-counter and prescription medicines only as told by your health care provider.  Drink enough fluid to keep your urine pale yellow.  Keep all follow-up visits as told by your health care provider. This is important. How is this prevented?  There is no vaccine to help prevent COVID-19 infection. However, there are steps you can take to protect yourself and others from this virus. To protect yourself:   Do not travel to areas where COVID-19 is a risk. The areas where COVID-19 is reported change often. To identify high-risk areas and travel restrictions, check the CDC travel website: wwwnc.cdc.gov/travel/notices  If you live in, or must travel to, an area where COVID-19 is a risk, take precautions to avoid infection. ? Stay away from people who are sick. ? Wash your hands often with soap and water for 20 seconds. If soap and water   are not available, use an alcohol-based hand sanitizer. ? Avoid touching your mouth, face, eyes, or nose. ? Avoid going out in public, follow guidance from your state and local health authorities. ? If you must go out in public, wear a cloth face covering or face mask. Make sure your mask covers your nose and mouth. ? Avoid crowded indoor spaces. Stay at least 6 feet (2 meters) away from others. ? Disinfect objects and surfaces that are frequently touched every day. This may include:  Counters and tables.  Doorknobs and light switches.  Sinks and faucets.  Electronics, such as phones, remote controls, keyboards, computers, and tablets. To protect others: If you have symptoms of COVID-19, take steps to prevent the virus from spreading to others.  If you think you have a COVID-19 infection, contact your health care  provider right away. Tell your health care team that you think you may have a COVID-19 infection.  Stay home. Leave your house only to seek medical care. Do not use public transport.  Do not travel while you are sick.  Wash your hands often with soap and water for 20 seconds. If soap and water are not available, use alcohol-based hand sanitizer.  Stay away from other members of your household. Let healthy household members care for children and pets, if possible. If you have to care for children or pets, wash your hands often and wear a mask. If possible, stay in your own room, separate from others. Use a different bathroom.  Make sure that all people in your household wash their hands well and often.  Cough or sneeze into a tissue or your sleeve or elbow. Do not cough or sneeze into your hand or into the air.  Wear a cloth face covering or face mask. Make sure your mask covers your nose and mouth. Where to find more information  Centers for Disease Control and Prevention: www.cdc.gov/coronavirus/2019-ncov/index.html  World Health Organization: www.who.int/health-topics/coronavirus Contact a health care provider if:  You live in or have traveled to an area where COVID-19 is a risk and you have symptoms of the infection.  You have had contact with someone who has COVID-19 and you have symptoms of the infection. Get help right away if:  You have trouble breathing.  You have pain or pressure in your chest.  You have confusion.  You have bluish lips and fingernails.  You have difficulty waking from sleep.  You have symptoms that get worse. These symptoms may represent a serious problem that is an emergency. Do not wait to see if the symptoms will go away. Get medical help right away. Call your local emergency services (911 in the [place] Do not drive yourself to the hospital. Let the emergency medical personnel know if you think you have COVID-19. Summary  COVID-19 is a  respiratory infection that is caused by a virus. It is also known as coronavirus disease or novel coronavirus. It can cause serious infections, such as pneumonia, acute respiratory distress syndrome, acute respiratory failure, or sepsis.  The virus that causes COVID-19 is contagious. This means that it can spread from person to person through droplets from breathing, speaking, singing, coughing, or sneezing.  You are more likely to develop a serious illness if you are 50 years of age or older, have a weak immune system, live in a nursing home, or have chronic disease.  There is no medicine to treat COVID-19. Your health care provider will talk with you about ways to treat your symptoms.    Take steps to protect yourself and others from infection. Wash your hands often and disinfect objects and surfaces that are frequently touched every day. Stay away from people who are sick and wear a mask if you are sick. This information is not intended to replace advice given to you by your health care provider. Make sure you discuss any questions you have with your health care provider. Document Revised: 02/14/2019 Document Reviewed: 05/23/2018 Elsevier Patient Education  2020 [employer].  

## 2019-05-22 NOTE — TOC Transition Note (Addendum)
Transition of Care [facility]) - CM/SW Discharge Note   Patient Details  Name: Henry Hinton MRN: [id number] Date of Birth: Jul 23, 1960  [facility] of [facility] CM/SW Contact:  Atilano Median, LCSW Phone Number: 05/22/2019, 3:40 PM   Clinical Narrative:    Discharged home with oxygen. Portable oxygen tank requested to be delivered to patient's room prior to discharge by [facility] RN.   Concentrator will be delivered to patient's home. Unit RN has been instructed to NOT discharge patient until she has received confirmation that this has been delivered. Patient upset, but agreeable to this.  Referral to Hospital to Home made and faxed in.  No other needs at this time. Case closed to this CSW.   Final next level of care: Home/Self Care Barriers to Discharge: Barriers Resolved   Patient Goals and CMS Choice   CMS Medicare.gov Compare Post Acute Care list provided to:: Patient Choice offered to / list presented to : Patient  Discharge Placement                       Discharge Plan and Services                DME Arranged: Oxygen DME Agency: [place] Date DME Agency Contacted: 05/22/19 Time DME Agency Contacted: [phone] Representative spoke with at DME Agency: Learta Codding            Social Determinants of Health ([place]) Interventions     Readmission Risk Interventions No flowsheet data found.

## 2019-05-22 NOTE — Progress Notes (Signed)
Patient could not be discharged yesterday as he found out that he did not have a ride back for his last dose of remdesivir at the outpatient infusion clinic.  So he was kept in the hospital.  No overnight issues.  He has been given his last dose of remdesivir.  He is requiring 4 L of oxygen.  Home oxygen has been ordered.  He has ambulated with PT today.  He ambulated 350 feet without getting short of breath.  He did desaturate but recovered at resy.  Patient wants to go home.  Okay for discharge today.  See discharge summary from yesterday.  Bonnielee Haff

## 2019-05-22 NOTE — Progress Notes (Signed)
SATURATION QUALIFICATIONS: (This note is used to comply with regulatory documentation for home oxygen)  Patient Saturations on Room Air at Rest =  88%  Patient Saturations on Room Air while Ambulating =  85% Patient Saturations on  2 Liters of oxygen while Ambulating =  86%  Please briefly explain why patient needs home oxygen:  Sats decrease while ambulating, required 8 L to recovery for 2 minutes.

## 2019-05-24 ENCOUNTER — Encounter (INDEPENDENT_AMBULATORY_CARE_PROVIDER_SITE_OTHER): Payer: Self-pay

## 2019-05-25 ENCOUNTER — Encounter (INDEPENDENT_AMBULATORY_CARE_PROVIDER_SITE_OTHER): Payer: Self-pay

## 2019-05-26 ENCOUNTER — Encounter (INDEPENDENT_AMBULATORY_CARE_PROVIDER_SITE_OTHER): Payer: Self-pay

## 2019-05-27 ENCOUNTER — Encounter (INDEPENDENT_AMBULATORY_CARE_PROVIDER_SITE_OTHER): Payer: Self-pay

## 2019-05-28 ENCOUNTER — Encounter (INDEPENDENT_AMBULATORY_CARE_PROVIDER_SITE_OTHER): Payer: Self-pay

## 2019-05-29 ENCOUNTER — Encounter (INDEPENDENT_AMBULATORY_CARE_PROVIDER_SITE_OTHER): Payer: Self-pay

## 2019-05-31 ENCOUNTER — Encounter (INDEPENDENT_AMBULATORY_CARE_PROVIDER_SITE_OTHER): Payer: Self-pay

## 2019-06-01 ENCOUNTER — Encounter (INDEPENDENT_AMBULATORY_CARE_PROVIDER_SITE_OTHER): Payer: Self-pay

## 2019-06-02 ENCOUNTER — Encounter: Payer: Self-pay | Admitting: Family Medicine

## 2019-06-02 ENCOUNTER — Encounter (INDEPENDENT_AMBULATORY_CARE_PROVIDER_SITE_OTHER): Payer: Self-pay

## 2019-06-03 ENCOUNTER — Encounter (INDEPENDENT_AMBULATORY_CARE_PROVIDER_SITE_OTHER): Payer: Self-pay

## 2019-06-04 ENCOUNTER — Encounter (INDEPENDENT_AMBULATORY_CARE_PROVIDER_SITE_OTHER): Payer: Self-pay

## 2019-06-06 ENCOUNTER — Encounter: Payer: Self-pay | Admitting: Family Medicine

## 2019-06-07 ENCOUNTER — Encounter: Payer: Self-pay | Admitting: Family Medicine

## 2019-06-11 NOTE — Telephone Encounter (Signed)
  Return to duty form for [place] test result noted from June 04, 2019 -reviewed from patient's MyChart attachment.  Admitted January 17 through January 20 with pneumonia due to COVID-19, and acute respiratory failure with hypoxia. Initial positive test January 11, resulted January 13.  Continued questionnaire/Covid home monitoring has been reviewed through February 3 with improving symptoms.  Not short of breath, and no concerning responses.  As it has been at least 3 weeks since initial symptoms, symptom improvement and no fevers, will return to full duties.  Will discuss this with patient and form to be completed.

## 2019-06-12 NOTE — Telephone Encounter (Signed)
Called pt  -Energy level 8/10. Feeling much better.  No dyspnea with usual activity. No limitations in activity for work.   No recent fevers, no antipyrteics. BP 138/78 today.  Some bilat leg swelling - similar in past, sodium intake affects swelling. Some trouble sleeping at times. Has appt with me next week. rtc precautions for appt sooner given. Form completed, ready for pickup.

## 2019-06-15 ENCOUNTER — Encounter: Payer: Self-pay | Admitting: Family Medicine

## 2019-06-16 ENCOUNTER — Other Ambulatory Visit: Payer: Self-pay | Admitting: Family Medicine

## 2019-06-16 DIAGNOSIS — M7989 Other specified soft tissue disorders: Secondary | ICD-10-CM

## 2019-06-16 DIAGNOSIS — Z8616 Personal history of COVID-19: Secondary | ICD-10-CM

## 2019-06-16 DIAGNOSIS — Z86718 Personal history of other venous thrombosis and embolism: Secondary | ICD-10-CM

## 2019-06-16 NOTE — Progress Notes (Signed)
See patient message - has appt in few days, but with hx of covid, prior DVT - check [place] tomorrow.

## 2019-06-18 ENCOUNTER — Encounter: Payer: Self-pay | Admitting: Family Medicine

## 2019-06-18 DIAGNOSIS — M7989 Other specified soft tissue disorders: Secondary | ICD-10-CM

## 2019-06-18 DIAGNOSIS — R238 Other skin changes: Secondary | ICD-10-CM

## 2019-06-18 MED ORDER — DOXYCYCLINE HYCLATE 100 MG PO TABS: 100.0000 mg | ORAL_TABLET | Freq: Two times a day (BID) | ORAL | 0 refills | Status: DC

## 2019-06-18 NOTE — Telephone Encounter (Signed)
mychart message, images reviewed.  He has history of cellulitis that has advanced quickly with abdominal wall cellulitis in the past.  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 Recent COVID-19 infection, some increased risk for DVT but appears to be bilateral symptoms, and more superficial, more likely cellulitis.  Ultrasound was ordered but not yet performed. Redness, more superficial, but R calf has enlarged - hardened skin. Left lower leg red, but no left calf swelling.   Has leftover eliquis (Has 8 of the 5mg  left) not yet had ultrasound performed.  Will start eliquis 10mg  BID for now, then will check into ultrasound status to have done tomorrow or Friday.  Start doxycycline 100mg  BID for possible cellulitis.  Leg elevation, heat over affected area for possible superficial thrombophlebitis, and close monitoring. If increasing redness advised to proceed to ER or urgent care.  Has follow up with me in 5 days scheduled as well.

## 2019-06-19 ENCOUNTER — Ambulatory Visit (HOSPITAL_COMMUNITY)
Admission: RE | Admit: 2019-06-19 | Discharge: 2019-06-19 | Disposition: A | Discharge status: Home / Self Care | Payer: BC Managed Care – PPO | Source: Ambulatory Visit | Attending: Family Medicine | Admitting: Family Medicine

## 2019-06-19 ENCOUNTER — Ambulatory Visit: Payer: BC Managed Care – PPO | Admitting: Family Medicine

## 2019-06-19 ENCOUNTER — Other Ambulatory Visit: Payer: Self-pay

## 2019-06-19 DIAGNOSIS — Z86718 Personal history of other venous thrombosis and embolism: Secondary | ICD-10-CM | POA: Insufficient documentation

## 2019-06-19 DIAGNOSIS — Z8616 Personal history of COVID-19: Secondary | ICD-10-CM | POA: Diagnosis not present

## 2019-06-19 DIAGNOSIS — M7989 Other specified soft tissue disorders: Secondary | ICD-10-CM | POA: Insufficient documentation

## 2019-06-19 NOTE — Progress Notes (Signed)
No evidence of DVT in bilateral lower extremities. Patient was given results.  Dr. Vonna Kotyk office is closed today. Oda Cogan, BS, RDMS, RVT

## 2019-06-23 ENCOUNTER — Ambulatory Visit: Payer: BC Managed Care – PPO | Admitting: Family Medicine

## 2019-06-23 ENCOUNTER — Other Ambulatory Visit: Payer: Self-pay

## 2019-06-23 ENCOUNTER — Encounter: Payer: Self-pay | Admitting: Family Medicine

## 2019-06-23 VITALS — BP 134/83 | HR 92 | Temp 98.2°F | Ht 69.0 in | Wt 374.0 lb

## 2019-06-23 DIAGNOSIS — I8001 Phlebitis and thrombophlebitis of superficial vessels of right lower extremity: Secondary | ICD-10-CM

## 2019-06-23 DIAGNOSIS — E785 Hyperlipidemia, unspecified: Secondary | ICD-10-CM

## 2019-06-23 DIAGNOSIS — L03115 Cellulitis of right lower limb: Secondary | ICD-10-CM

## 2019-06-23 DIAGNOSIS — E1165 Type 2 diabetes mellitus with hyperglycemia: Secondary | ICD-10-CM

## 2019-06-23 DIAGNOSIS — M7989 Other specified soft tissue disorders: Secondary | ICD-10-CM | POA: Diagnosis not present

## 2019-06-23 MED ORDER — METFORMIN HCL 1000 MG PO TABS: ORAL_TABLET | ORAL | 1 refills | Status: DC

## 2019-06-23 MED ORDER — TRULICITY 0.75 MG/0.5ML ~~LOC~~ SOAJ: 0.7500 mg | SUBCUTANEOUS | 1 refills | Status: DC

## 2019-06-23 MED ORDER — ATORVASTATIN CALCIUM 10 MG PO TABS: 10.0000 mg | ORAL_TABLET | ORAL | 1 refills | Status: DC

## 2019-06-23 NOTE — Patient Instructions (Addendum)
  Continue heat, ibuprofen temporarily, leg elevation for possible superficial cellulitis, but continue doxycycline as there may be a component of cellulitis.  Apply Eucerin or Aveeno to dry skin.  If any wounds, ulcerations, openings or discharge, be seen right away.  If area is not continuing to improve through this weekend, or any worsening - please return for recheck.  Otherwise follow-up in 2 weeks.  No changes for now with diabetes, will check some lab work today and can discuss that further in 2 weeks.   If you have lab work done today you will be contacted with your lab results within the next 2 weeks.  If you have not heard from [place] then please contact us. The fastest way to get your results is to register for My Chart.   IF you received an x-ray today, you will receive an invoice from [facility]. Please contact [facility] at [phone] with questions or concerns regarding your invoice.   IF you received labwork today, you will receive an invoice from [place]. Please contact [facility] at [phone] with questions or concerns regarding your invoice.   Our billing staff will not be able to assist you with questions regarding bills from these companies.  You will be contacted with the lab results as soon as they are available. The fastest way to get your results is to activate your My Chart account. Instructions are located on the last page of this paperwork. If you have not heard from [place] regarding the results in 2 weeks, please contact this office.

## 2019-06-23 NOTE — Progress Notes (Signed)
 Subjective:  Patient ID: Henry Ray Och Jr., male    DOB: 08/25/1960  Age: 58 y.o. MRN: [id number]  CC:  Chief Complaint  Patient presents with  . Follow-up    cellulitis worse in R leg ,     HPI Henry Ray Degenhart Jr. presents for   Cellulitis of leg: See recent MyChart messages, telephone messages.  History of COVID-19 infection in January.  Has been treated with Eliquis for anticoagulation, DVT prevention.  History of DVT. My chart message on February 14, noticed leg swelling, right leg worse than the left, similar to previous.  Has been treated with ibuprofen and aspirin, topical heat, leg pumps and elevation. Temporarily restarted on Eliquis on February 17.  Started doxycycline 100 mg twice daily for possible cellulitis with symptomatic treatment.  Ultrasound February 18 - for DVT.  Eliquis discontinued, restarted his ibuprofen, continued on doxycycline 100 mg twice daily.   Both legs swelling since past 3 weeks. Noticed after steroids. Not able to swim right now. Weight increased with steroid - down to 350 at one point. Swam 1.25 miles per day prior to Covid. Plans to restart water walking for now.   Left leg swelling has improved.  Minimal improvement of R leg. Swelling better, less sore, less red. No side effects of doxycycline.  No fevers. No chest pains/new dyspnea. Breathing is better - using incentive spirometer daily. Albuterol once per day.  Swelling down in the am - increases during day. Taking lasix 80mg twice per day. Difficulty with compression stockings - roll up.   Diabetes: Complicated by hyperglycemia.  Metformin 1000mg BID, trulicity 0.75 once per week - no side effects, n/v.  On statin -lipitor once per week (some myalgias initially daily).  Cutting back on fat intake - more vegetables, some fruit.under 2000kcal per day. Plans to restart water walking as above.  Has been cutting back on sodium.  Microalbumin: normal ratio 09/27/18.  Optho, foot exam, pneumovax:  up to date.  Not checking home readings.  Did have dexamethasone during Covid treatment.   Lab Results  Component Value Date   HGBA1C 8.4 (H) 03/20/2019   HGBA1C 8.3 (H) 12/25/2018   HGBA1C 9.1 (A) 09/24/2018   Lab Results  Component Value Date   MICROALBUR 0.7 02/24/2016   LDLCALC 131 (H) 03/20/2019   CREATININE 0.72 05/21/2019    History Patient Active Problem List   Diagnosis Date Noted  . Acute hypoxemic respiratory failure due to COVID-19 (HCC) 05/18/2019  . BPH without obstruction/lower urinary tract symptoms 03/14/2018  . Family history of prostate cancer 03/14/2018  . History of nephrolithiasis 03/14/2018  . Microscopic hematuria 03/14/2018  . Diarrhea 11/20/2017  . Diabetes mellitus without complication (HCC) 11/19/2017  . Iron deficiency anemia 11/19/2017  . Influenza, pneumonia 09/22/2017  . Abdominal wall cellulitis 01/22/2017  . Insect bite 01/22/2017  . Hyperkalemia 01/22/2017  . OSA (obstructive sleep apnea) 01/22/2017  . Essential hypertension   . Hoarseness 06/08/2016  . Vocal cord paralysis 01/31/2016  . Hypercalciuria 01/10/2016  . Cellulitis 11/28/2015  . Cellulitis of trunk 11/27/2015  . Nonspecific abnormal finding in stool contents   . Colon cancer screening   . Venous stasis   . History of DVT (deep vein thrombosis)   . Sepsis (HCC) 10/09/2015  . Panniculitis 10/09/2015  . CLL (chronic lymphocytic leukemia) (HCC) 05/27/2015  . 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 Calculus of kidney 10/27/2014  . Leukocytosis (leucocytosis) 08/29/2014  . HNP (herniated nucleus pulposus), cervical 01/21/2013  .   bilat hip replacement 04/08/2012  . Morbid obesity ([place]) 04/08/2012  . Edema 04/08/2012   Past Medical History:  Diagnosis Date  . Anemia   . Arthritis   . BMI 50.0-59.9, adult ([place])   . Cellulitis 11/2015   trunk  . cll 11/2014   CLL - 11/30/2014- Dr. [facility] [place], [place]  . DVT (deep venous thrombosis) ([place]) 6/16   Rt calf  took  xarelto for 6 months  . Family history of anesthesia complication    " father having delirium after anesthesia"  . Heart murmur    Hx; of as a child  . History of kidney stones    x2  . Joint pain    Hx: of periodically  . Numbness and tingling    Hx: of in right arm  . Pneumonia   . Sleep apnea    Cpap use- settings 10   Past Surgical History:  Procedure Laterality Date  . BACK SURGERY    . COLONOSCOPY     Hx: of  . COLONOSCOPY N/A 11/23/2015   Procedure: COLONOSCOPY;  Surgeon: Irene Shipper, MD;  Location: [facility];  Service: Endoscopy;  Laterality: N/A;  . INGUINAL HERNIA REPAIR Left 06/01/2015   Procedure: LEFT INGUINAL HERNIA REPAIR WITH MESH;  Surgeon: Autumn Messing III, MD;  Location: [facility];  Service: General;  Laterality: Left;  . INGUINAL HERNIA REPAIR    . INSERTION OF MESH Left 06/01/2015   Procedure: INSERTION OF MESH;  Surgeon: Autumn Messing III, MD;  Location: [facility];  Service: General;  Laterality: Left;  . JOINT REPLACEMENT Bilateral    BTHA  . KNEE ARTHROSCOPY Left 07/21/2014   Dr. Noemi Chapel  . LUMBAR LAMINECTOMY  2005  . POSTERIOR CERVICAL LAMINECTOMY WITH MET- RX Right 01/21/2013   Procedure: Right Sitting C6-7 Microdiskectomy with Met-rex;  Surgeon: Kristeen Miss, MD;  Location: [facility];  Service: Neurosurgery;  Laterality: Right;  Right Sitting C6-7 Microdiskectomy with Met-rex  . STENT PLACEMENT RT URETER ([place] [facility])  10/23/2014  . tenosynovitis  2000   Hx: of thumb  . TONSILLECTOMY    . TOTAL HIP ARTHROPLASTY Bilateral   . WISDOM TOOTH EXTRACTION  1974   all 4   Allergies  Allergen Reactions  . Adhesive [Tape] Other (See Comments)    Blisters and rash at site   Prior to Admission medications   Medication Sig Start Date End Date Taking? Authorizing Provider  Ascorbic Acid (VITAMIN C) 1000 MG tablet Take 1,000 mg by mouth daily.   Yes [provider]  aspirin EC 81 MG tablet Take 81 mg by mouth daily.   Yes [provider]  atorvastatin  (LIPITOR) 10 MG tablet Take 1 tablet (10 mg total) by mouth daily. Patient taking differently: Take 10 mg by mouth every 7 (seven) days. Sunday of each week 04/08/19  Yes [name], MD  benzonatate (TESSALON PERLES) 100 MG capsule Take 1 capsule (100 mg total) by mouth 3 (three) times daily as needed for cough. 05/21/19 05/20/20 Yes Bonnielee Haff, MD  doxycycline (VIBRA-TABS) 100 MG tablet Take 1 tablet (100 mg total) by mouth 2 (two) times daily. 06/18/19  Yes [name], MD  Dulaglutide (TRULICITY) 1.54 MG/8.6PY SOPN Inject 0.75 mg into the skin once a week. Patient taking differently: Inject 0.75 mg into the skin once a week. Pt takes on Sunday of each week 04/08/19  Yes [name], MD  ferrous sulfate (IRON SUPPLEMENT) 325 (65 FE) MG  tablet Take 325 mg by mouth daily with breakfast.   Yes [provider]  fluticasone (FLONASE) 50 MCG/ACT nasal spray SPRAY 2 SPRAYS INTO EACH NOSTRIL EVERY DAY 10/25/18  Yes Greene, Jeffrey R, MD  furosemide (LASIX) 80 MG tablet Take 1 tablet (80 mg total) by mouth 2 (two) times daily. 03/20/19  Yes Greene, Jeffrey R, MD  ibuprofen (ADVIL,MOTRIN) 200 MG tablet Take 600 mg by mouth every 6 (six) hours as needed for mild pain.   Yes [provider]  metFORMIN (GLUCOPHAGE) 1000 MG tablet TAKE 1 TABLET BY MOUTH 2 TIMES DAILY WITH A MEAL. 02/21/19  Yes Greene, Jeffrey R, MD  Multiple Vitamins-Minerals (MULTIVITAMIN WITH MINERALS) tablet Take 1 tablet by mouth daily.   Yes [provider]  potassium chloride SA (KLOR-CON M20) 20 MEQ tablet Take 1 tablet (20 mEq total) by mouth 2 (two) times daily. 03/20/19  Yes Greene, Jeffrey R, MD  dexamethasone (DECADRON) 2 MG tablet Take 2 tablets once daily for 3 days, then 1 tablet once daily for 3 days, then STOP. Patient not taking: Reported on 06/23/2019 05/21/19   Krishnan, Gokul, MD   Social History   Socioeconomic History  . Marital status: Divorced    Spouse name: Not on file  .  Number of children: 0  . Years of education: Not on file  . Highest education level: Not on file  Occupational History  . Occupation: Teacher/Athletic Trainer    Employer: [employer]  Tobacco Use  . Smoking status: Never Smoker  . Smokeless tobacco: Former User    Types: Chew  Substance and Sexual Activity  . Alcohol use: Yes    Alcohol/week: 1.0 standard drinks    Types: 1 Standard drinks or equivalent per week    Comment: BEER [employer] LIQUOR  . Drug use: No  . Sexual activity: Not Currently  Other Topics Concern  . Not on file  Social History Narrative   Exercise swimming 3-5 times/week for 45 minutes   Social Determinants of Health   Financial Resource Strain:   . Difficulty of Paying Living Expenses: Not on file  Food Insecurity:   . Worried About Running Out of Food in the Last Year: Not on file  . Ran Out of Food in the Last Year: Not on file  Transportation Needs:   . Lack of Transportation (Medical): Not on file  . Lack of Transportation (Non-Medical): Not on file  Physical Activity:   . Days of Exercise per Week: Not on file  . Minutes of Exercise per Session: Not on file  Stress:   . Feeling of Stress : Not on file  Social Connections:   . Frequency of Communication with Friends and Family: Not on file  . Frequency of Social Gatherings with Friends and Family: Not on file  . Attends Religious Services: Not on file  . Active Member of Clubs or Organizations: Not on file  . Attends Club or Organization Meetings: Not on file  . Marital Status: Not on file  Intimate Partner Violence:   . Fear of Current or Ex-Partner: Not on file  . Emotionally Abused: Not on file  . Physically Abused: Not on file  . Sexually Abused: Not on file    Review of Systems  Respiratory: Negative for shortness of breath.   Cardiovascular: Positive for leg swelling. Negative for chest pain and palpitations.     Objective:   Vitals:   06/23/19 0923  BP: 134/83   Pulse: 92    Temp: 98.2 F (36.8 C)  TempSrc: Temporal  SpO2: 95%  Weight: (!) 374 lb (169.6 kg)  Height: 5' 9" (1.753 m)     Physical Exam Vitals reviewed.  Constitutional:      Appearance: He is well-developed.  HENT:     Head: Normocephalic and atraumatic.  Eyes:     Pupils: Pupils are equal, round, and reactive to light.  Neck:     Vascular: No carotid bruit or JVD.  Cardiovascular:     Rate and Rhythm: Normal rate and regular rhythm.     Heart sounds: Murmur (faint 2/6 systolic. ) present.  Pulmonary:     Effort: Pulmonary effort is normal.     Breath sounds: Normal breath sounds. No rales.  Musculoskeletal:     Right lower leg: Edema (see photo - min warmth posteriorly. firm lower leg with few firm areas lower medical calf - superficial. negative homans.  ) present.     Left lower leg: Edema present.  Skin:    General: Skin is warm and dry.  Neurological:     Mental Status: He is alert and oriented to person, place, and time.          Assessment & Plan:  Henry Riebel. is a 59 y.o. male . Cellulitis of leg, right Leg swelling - Plan: Basic metabolic panel Thrombophlebitis of superficial veins of right lower extremity  -Cellulitis versus possible superficial thrombophlebitis.  No sign of DVT on ultrasound.  Continue doxycycline, heat, ibuprofen, elevation for fluid mobilization, eventually plan for returning to water-based exercise.  Recheck in 2 weeks, sooner if not improving or worse  Type 2 diabetes mellitus with hyperglycemia, without long-term current use of insulin (HCC) - Plan: Hemoglobin G9F, Basic metabolic panel, Dulaglutide (TRULICITY) 6.21 HY/8.6VH SOPN, metFORMIN (GLUCOPHAGE) 1000 MG tablet  -Tolerating Trulicity and Metformin.  May have slight bump in A1c with dexamethasone during COVID-19 treatment.  Check labs, discuss further at follow-up in 2 weeks, home monitoring may also be helpful for more recent readings.  Hyperlipidemia, unspecified  hyperlipidemia type - Plan: atorvastatin (LIPITOR) 10 MG tablet  -Tolerating Lipitor, continue same.  Meds ordered this encounter  Medications  . Dulaglutide (TRULICITY) 8.46 NG/2.9BM SOPN    Sig: Inject 0.75 mg into the skin once a week.    Dispense:  6 mL    Refill:  1  . metFORMIN (GLUCOPHAGE) 1000 MG tablet    Sig: TAKE 1 TABLET BY MOUTH 2 TIMES DAILY WITH A MEAL.    Dispense:  180 tablet    Refill:  1  . atorvastatin (LIPITOR) 10 MG tablet    Sig: Take 1 tablet (10 mg total) by mouth every 3 (three) days. Sunday of each week    Dispense:  30 tablet    Refill:  1   Patient Instructions    Continue heat, ibuprofen temporarily, leg elevation for possible superficial cellulitis, but continue doxycycline as there may be a component of cellulitis.  Apply Eucerin or Aveeno to dry skin.  If any wounds, ulcerations, openings or discharge, be seen right away.  If area is not continuing to improve through this weekend, or any worsening - please return for recheck.  Otherwise follow-up in 2 weeks.  No changes for now with diabetes, will check some lab work today and can discuss that further in 2 weeks.   If you have lab work done today you will be contacted with your lab results within the next 2 weeks.  If  you have not heard from us then please contact us. The fastest way to get your results is to register for My Chart.   IF you received an x-ray today, you will receive an invoice from [facility]. Please contact [facility] at [phone] with questions or concerns regarding your invoice.   IF you received labwork today, you will receive an invoice from [facility]. Please contact [facility] at [phone] with questions or concerns regarding your invoice.   Our billing staff will not be able to assist you with questions regarding bills from these companies.  You will be contacted with the lab results as soon as they are available. The fastest way to get your results is  to activate your My Chart account. Instructions are located on the last page of this paperwork. If you have not heard from us regarding the results in 2 weeks, please contact this office.          Signed, Jeffrey Greene, MD [facility]  

## 2019-06-24 ENCOUNTER — Encounter: Payer: Self-pay | Admitting: Family Medicine

## 2019-06-24 LAB — BASIC METABOLIC PANEL
BUN/Creatinine Ratio: 17 (ref 9–20)
BUN: 13 mg/dL (ref 6–24)
CO2: 23 mmol/L (ref 20–29)
Calcium: 8.5 mg/dL — ABNORMAL LOW (ref 8.7–10.2)
Chloride: 100 mmol/L (ref 96–106)
Creatinine, Ser: 0.77 mg/dL (ref 0.76–1.27)
GFR calc Af Amer: 116 mL/min/{1.73_m2} (ref >59)
GFR calc non Af Amer: 100 mL/min/{1.73_m2} (ref >59)
Glucose: 264 mg/dL — ABNORMAL HIGH (ref 65–99)
Potassium: 4.7 mmol/L (ref 3.5–5.2)
Sodium: 139 mmol/L (ref 134–144)

## 2019-06-24 LAB — HEMOGLOBIN A1C
Est. average glucose Bld gHb Est-mCnc: 232 mg/dL
Hgb A1c MFr Bld: 9.7 % — ABNORMAL HIGH (ref 4.8–5.6)

## 2019-07-01 ENCOUNTER — Other Ambulatory Visit: Payer: Self-pay | Admitting: Family Medicine

## 2019-07-01 DIAGNOSIS — E1165 Type 2 diabetes mellitus with hyperglycemia: Secondary | ICD-10-CM

## 2019-07-01 MED ORDER — TRULICITY 0.75 MG/0.5ML ~~LOC~~ SOAJ: 1.5000 mg | SUBCUTANEOUS | 1 refills | Status: DC

## 2019-07-07 ENCOUNTER — Ambulatory Visit: Payer: BC Managed Care – PPO | Admitting: Family Medicine

## 2019-07-07 ENCOUNTER — Encounter: Payer: Self-pay | Admitting: Family Medicine

## 2019-07-07 ENCOUNTER — Other Ambulatory Visit: Payer: Self-pay

## 2019-07-07 VITALS — BP 133/87 | HR 81 | Temp 97.9°F | Ht 69.0 in | Wt 369.0 lb

## 2019-07-07 DIAGNOSIS — M7989 Other specified soft tissue disorders: Secondary | ICD-10-CM

## 2019-07-07 DIAGNOSIS — L03115 Cellulitis of right lower limb: Secondary | ICD-10-CM | POA: Diagnosis not present

## 2019-07-07 DIAGNOSIS — E1165 Type 2 diabetes mellitus with hyperglycemia: Secondary | ICD-10-CM | POA: Diagnosis not present

## 2019-07-07 NOTE — Progress Notes (Signed)
Subjective:  Patient ID: Henry Navy., male    DOB: 02/12/1961  Age: 59 y.o. MRN: [id number]  CC:  Chief Complaint  Patient presents with  . Follow-up    on Legs and diabetes. Leg swelling has gotten better. pt reports no pain and redness has gone down. pt is doing watter running 3 days aweek. pt dose not check BS. pt reports no issues with diabetes since last visit. current medications are working well with no side effects. pt states he thinkg he passed a kidney stone last weekend.     HPI Henry Hinton. presents for   R leg cellulitis: Cellulitis versus thrombophlebitis, see prior notes.  Most recently discussed February 22, no sign of DVT on previous ultrasound.  Was continued on doxycycline, heat, ibuprofen and elevation for fluid mobilization.  Has returned to water-based exercises.  Symptoms are improving. No recent heat/ibuprofen. Redness gone. Soreness resolved.  Swimming 3-4 days per week. Deep water running.  Wears compression stocking with more prolonged standing - better with this approach.  Down 5# since last visit. Had gained 20# from prior illness.  Wt Readings from Last 3 Encounters:  07/07/19 (!) 369 lb (167.4 kg)  06/23/19 (!) 374 lb (169.6 kg)  05/18/19 (!) 363 lb (164.7 kg)   On statin 3 days per week now. Tolerating statin.   Diabetes: Was continued on Trulicity and Metformin at last visit.  Did receive dexamethasone during COVID-19 treatment.  Did have elevated A1c from 8.4-9.7.  Initially increased Trulicity 1.5 mg/week.  He has restarted water-based exercise as above. Still finishing trulicity at 4.38VK per week. Still on metformin 1088m BID - no new intolerances.  Has not rechecked home readings.   Lab Results  Component Value Date   HGBA1C 9.7 (H) 06/23/2019   HGBA1C 8.4 (H) 03/20/2019   HGBA1C 8.3 (H) 12/25/2018   Lab Results  Component Value Date   MICROALBUR 0.7 02/24/2016   LDLCALC 131 (H) 03/20/2019   CREATININE 0.77 06/23/2019       History Patient Active Problem List   Diagnosis Date Noted  . Acute hypoxemic respiratory failure due to COVID-19 ([place] 05/18/2019  . BPH without obstruction/lower urinary tract symptoms 03/14/2018  . Family history of prostate cancer 03/14/2018  . History of nephrolithiasis 03/14/2018  . Microscopic hematuria 03/14/2018  . Diarrhea 11/20/2017  . Diabetes mellitus without complication ([place] 018/40/3754 . Iron deficiency anemia 11/19/2017  . Influenza, pneumonia 09/22/2017  . Abdominal wall cellulitis 01/22/2017  . Insect bite 01/22/2017  . Hyperkalemia 01/22/2017  . OSA (obstructive sleep apnea) 01/22/2017  . Essential hypertension   . Hoarseness 06/08/2016  . Vocal cord paralysis 01/31/2016  . Hypercalciuria 01/10/2016  . Cellulitis 11/28/2015  . Cellulitis of trunk 11/27/2015  . Nonspecific abnormal finding in stool contents   . Colon cancer screening   . Venous stasis   . History of DVT (deep vein thrombosis)   . Sepsis ([place] 10/09/2015  . Panniculitis 10/09/2015  . CLL (chronic lymphocytic leukemia) ([place] 05/27/2015  . 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 Calculus of kidney 10/27/2014  . Leukocytosis (leucocytosis) 08/29/2014  . HNP (herniated nucleus pulposus), cervical 01/21/2013  . bilat hip replacement 04/08/2012  . Morbid obesity ([facility] 04/08/2012  . Edema 04/08/2012   Past Medical History:  Diagnosis Date  . Anemia   . Arthritis   . BMI 50.0-59.9, adult ([place]   . Cellulitis 11/2015   trunk  . cll 11/2014   CLL - 11/30/2014- Dr.  Chinnasami-[facility] [place], [place]  . DVT (deep venous thrombosis) ([place]) 6/16   Rt calf  took xarelto for 6 months  . Family history of anesthesia complication    " father having delirium after anesthesia"  . Heart murmur    Hx; of as a child  . History of kidney stones    x2  . Joint pain    Hx: of periodically  . Numbness and tingling    Hx: of in right arm  . Pneumonia   . Sleep apnea    Cpap use- settings 10   Past  Surgical History:  Procedure Laterality Date  . BACK SURGERY    . COLONOSCOPY     Hx: of  . COLONOSCOPY N/A 11/23/2015   Procedure: COLONOSCOPY;  Surgeon: Irene Shipper, MD;  Location: [facility];  Service: Endoscopy;  Laterality: N/A;  . INGUINAL HERNIA REPAIR Left 06/01/2015   Procedure: LEFT INGUINAL HERNIA REPAIR WITH MESH;  Surgeon: Autumn Messing III, MD;  Location: [facility];  Service: General;  Laterality: Left;  . INGUINAL HERNIA REPAIR    . INSERTION OF MESH Left 06/01/2015   Procedure: INSERTION OF MESH;  Surgeon: Autumn Messing III, MD;  Location: [facility];  Service: General;  Laterality: Left;  . JOINT REPLACEMENT Bilateral    BTHA  . KNEE ARTHROSCOPY Left 07/21/2014   Dr. Noemi Chapel  . LUMBAR LAMINECTOMY  2005  . POSTERIOR CERVICAL LAMINECTOMY WITH MET- RX Right 01/21/2013   Procedure: Right Sitting C6-7 Microdiskectomy with Met-rex;  Surgeon: Kristeen Miss, MD;  Location: [place] [facility];  Service: Neurosurgery;  Laterality: Right;  Right Sitting C6-7 Microdiskectomy with Met-rex  . STENT PLACEMENT RT URETER ([place] HX)  10/23/2014  . tenosynovitis  2000   Hx: of thumb  . TONSILLECTOMY    . TOTAL HIP ARTHROPLASTY Bilateral   . WISDOM TOOTH EXTRACTION  1974   all 4   Allergies  Allergen Reactions  . Adhesive [Tape] Other (See Comments)    Blisters and rash at site   Prior to Admission medications   Medication Sig Start Date End Date Taking? Authorizing Provider  Ascorbic Acid (VITAMIN C) 1000 MG tablet Take 1,000 mg by mouth daily.   Yes [provider]  aspirin EC 81 MG tablet Take 81 mg by mouth daily.   Yes [provider]  atorvastatin (LIPITOR) 10 MG tablet Take 1 tablet (10 mg total) by mouth every 3 (three) days. Sunday of each week 06/23/19  Yes [name], MD  Dulaglutide (TRULICITY) 9.89 QJ/1.9ER SOPN Inject 1.5 mg into the skin once a week. 07/01/19  Yes [name], MD  ferrous sulfate (IRON SUPPLEMENT) 325 (65 FE) MG tablet Take 325 mg by mouth daily  with breakfast.   Yes [provider]  fluticasone (FLONASE) 50 MCG/ACT nasal spray SPRAY 2 SPRAYS INTO EACH NOSTRIL EVERY DAY 10/25/18  Yes [name], MD  furosemide (LASIX) 80 MG tablet Take 1 tablet (80 mg total) by mouth 2 (two) times daily. 03/20/19  Yes [name], MD  ibuprofen (ADVIL,MOTRIN) 200 MG tablet Take 600 mg by mouth every 6 (six) hours as needed for mild pain.   Yes [provider]  metFORMIN (GLUCOPHAGE) 1000 MG tablet TAKE 1 TABLET BY MOUTH 2 TIMES DAILY WITH A MEAL. 06/23/19  Yes [name], MD  Multiple Vitamins-Minerals (MULTIVITAMIN WITH MINERALS) tablet Take 1 tablet by mouth daily.   Yes [provider]  potassium chloride SA (KLOR-CON  M20) 20 MEQ tablet Take 1 tablet (20 mEq total) by mouth 2 (two) times daily. 03/20/19  Yes [name], MD  benzonatate (TESSALON PERLES) 100 MG capsule Take 1 capsule (100 mg total) by mouth 3 (three) times daily as needed for cough. Patient not taking: Reported on 07/07/2019 05/21/19 05/20/20  Bonnielee Haff, MD  dexamethasone (DECADRON) 2 MG tablet Take 2 tablets once daily for 3 days, then 1 tablet once daily for 3 days, then STOP. Patient not taking: Reported on 06/23/2019 05/21/19   Bonnielee Haff, MD  doxycycline (VIBRA-TABS) 100 MG tablet Take 1 tablet (100 mg total) by mouth 2 (two) times daily. 06/18/19   [name], MD   Social History   Socioeconomic History  . Marital status: Divorced    Spouse name: Not on file  . Number of children: 0  . Years of education: Not on file  . Highest education level: Not on file  Occupational History  . Occupation: Geophysical data processor: [employer]  Tobacco Use  . Smoking status: Never Smoker  . Smokeless tobacco: Former Systems developer    Types: Chew  Substance and Sexual Activity  . Alcohol use: Yes    Alcohol/week: 1.0 standard drinks    Types: 1 Standard drinks or equivalent per week    Comment: BEER AND  LIQUOR  . Drug use: No  . Sexual activity: Not Currently  Other Topics Concern  . Not on file  Social History Narrative   Exercise swimming 3-5 times/week for 45 minutes   Social Determinants of Health   Financial Resource Strain:   . Difficulty of Paying Living Expenses: Not on file  Food Insecurity:   . Worried About Charity fundraiser in the Last Year: Not on file  . Ran Out of Food in the Last Year: Not on file  Transportation Needs:   . Lack of Transportation (Medical): Not on file  . Lack of Transportation (Non-Medical): Not on file  Physical Activity:   . Days of Exercise per Week: Not on file  . Minutes of Exercise per Session: Not on file  Stress:   . Feeling of Stress : Not on file  Social Connections:   . Frequency of Communication with Friends and Family: Not on file  . Frequency of Social Gatherings with Friends and Family: Not on file  . Attends Religious Services: Not on file  . Active Member of Clubs or Organizations: Not on file  . Attends Archivist Meetings: Not on file  . Marital Status: Not on file  Intimate Partner Violence:   . Fear of Current or Ex-Partner: Not on file  . Emotionally Abused: Not on file  . Physically Abused: Not on file  . Sexually Abused: Not on file    Review of Systems   Objective:   Vitals:   07/07/19 1155  BP: 133/87  Pulse: 81  Temp: 97.9 F (36.6 C)  TempSrc: Temporal  SpO2: 96%  Weight: (!) 369 lb (167.4 kg)  Height: '5\' 9"'  (1.753 m)     Physical Exam Vitals reviewed.  Constitutional:      Appearance: He is well-developed. He is obese.  HENT:     Head: Normocephalic and atraumatic.  Eyes:     Pupils: Pupils are equal, round, and reactive to light.  Neck:     Vascular: No carotid bruit or JVD.  Cardiovascular:     Rate and Rhythm: Normal rate and regular rhythm.  Heart sounds: Normal heart sounds. No murmur.  Pulmonary:     Effort: Pulmonary effort is normal.     Breath sounds: Normal  breath sounds. No rales.  Musculoskeletal:     Right lower leg: Edema (see photo.  no calf ttp, no erythema) present.     Left lower leg: Edema present.  Skin:    General: Skin is warm and dry.  Neurological:     General: No focal deficit present.     Mental Status: He is alert and oriented to person, place, and time.         Assessment & Plan:  Henry Hinton. is a 59 y.o. male . Type 2 diabetes mellitus with hyperglycemia, without long-term current use of insulin (HCC) - Plan: Basic metabolic panel, Fructosamine  -Likely had component of elevation due to infection as well as previous dexamethasone use but still baseline elevated.  Will increase Trulicity to 1.5 mg, home monitoring with lab only visit for fructosamine, BMP the next few weeks.  Likely will need additional changes.  However has restarted his exercise and losing weight.  Continue close monitoring  Leg swelling Cellulitis of leg, right  -Previous superficial thrombophlebitis versus cellulitis have improved,  dry skin discussed and application of lotion.  RTC precautions  No orders of the defined types were placed in this encounter.  Patient Instructions       If you have lab work done today you will be contacted with your lab results within the next 2 weeks.  If you have not heard from [place] then please contact us. The fastest way to get your results is to register for My Chart.   IF you received an x-ray today, you will receive an invoice from [facility]. Please contact [facility] at [phone] with questions or concerns regarding your invoice.   IF you received labwork today, you will receive an invoice from [facility]. Please contact [facility] at [phone] with questions or concerns regarding your invoice.   Our billing staff will not be able to assist you with questions regarding bills from these companies.  You will be contacted with the lab results as soon as they are available.  The fastest way to get your results is to activate your My Chart account. Instructions are located on the last page of this paperwork. If you have not heard from [place] regarding the results in 2 weeks, please contact this office.         Signed, [name], MD [facility]

## 2019-07-07 NOTE — Patient Instructions (Addendum)
  Keep a record of your blood sugars outside of the office either fasting or 2 hours after meals and send me some readings next few weeks on the 1.5mg  trulicity.  Lab only visit in 3 weeks.  Let me know if there are questions.   If you have lab work done today you will be contacted with your lab results within the next 2 weeks.  If you have not heard from [place] then please contact us. The fastest way to get your results is to register for My Chart.   IF you received an x-ray today, you will receive an invoice from [facility]. Please contact [facility] at [phone] with questions or concerns regarding your invoice.   IF you received labwork today, you will receive an invoice from [place]. Please contact [facility] at [phone] with questions or concerns regarding your invoice.   Our billing staff will not be able to assist you with questions regarding bills from these companies.  You will be contacted with the lab results as soon as they are available. The fastest way to get your results is to activate your My Chart account. Instructions are located on the last page of this paperwork. If you have not heard from [place] regarding the results in 2 weeks, please contact this office.

## 2019-07-08 ENCOUNTER — Encounter: Payer: Self-pay | Admitting: Family Medicine

## 2019-07-28 ENCOUNTER — Other Ambulatory Visit: Payer: Self-pay

## 2019-07-28 ENCOUNTER — Ambulatory Visit (INDEPENDENT_AMBULATORY_CARE_PROVIDER_SITE_OTHER): Payer: BC Managed Care – PPO | Admitting: Family Medicine

## 2019-07-28 DIAGNOSIS — E1165 Type 2 diabetes mellitus with hyperglycemia: Secondary | ICD-10-CM

## 2019-07-29 LAB — BASIC METABOLIC PANEL
BUN/Creatinine Ratio: 28 — ABNORMAL HIGH (ref 9–20)
BUN: 20 mg/dL (ref 6–24)
CO2: 22 mmol/L (ref 20–29)
Calcium: 8.4 mg/dL — ABNORMAL LOW (ref 8.7–10.2)
Chloride: 104 mmol/L (ref 96–106)
Creatinine, Ser: 0.72 mg/dL — ABNORMAL LOW (ref 0.76–1.27)
GFR calc Af Amer: 118 mL/min/{1.73_m2} (ref >59)
GFR calc non Af Amer: 102 mL/min/{1.73_m2} (ref >59)
Glucose: 139 mg/dL — ABNORMAL HIGH (ref 65–99)
Potassium: 3.9 mmol/L (ref 3.5–5.2)
Sodium: 142 mmol/L (ref 134–144)

## 2019-07-29 LAB — FRUCTOSAMINE: Fructosamine: 248 umol/L (ref 0–285)

## 2019-08-09 ENCOUNTER — Other Ambulatory Visit: Payer: Self-pay | Admitting: Family Medicine

## 2019-08-09 DIAGNOSIS — E785 Hyperlipidemia, unspecified: Secondary | ICD-10-CM

## 2019-08-09 NOTE — Telephone Encounter (Signed)
Please note that Sig is inaccurate.. Previous sig: "1 tablet every 3 days, Sunday of each week"

## 2019-08-19 DIAGNOSIS — Z86718 Personal history of other venous thrombosis and embolism: Secondary | ICD-10-CM | POA: Insufficient documentation

## 2019-08-19 DIAGNOSIS — I878 Other specified disorders of veins: Secondary | ICD-10-CM | POA: Insufficient documentation

## 2019-08-19 DIAGNOSIS — I10 Essential (primary) hypertension: Secondary | ICD-10-CM

## 2019-08-22 ENCOUNTER — Encounter: Payer: Self-pay | Admitting: Family Medicine

## 2019-08-22 NOTE — Telephone Encounter (Signed)
Due to pt doubling up on his Trulicity he has run out early could you send in a new Rx with the adjusted dosage?

## 2019-08-23 MED ORDER — TRULICITY 1.5 MG/0.5ML ~~LOC~~ SOAJ: 1.5000 mg | SUBCUTANEOUS | 1 refills | Status: DC

## 2019-08-23 NOTE — Telephone Encounter (Signed)
Done

## 2019-09-08 ENCOUNTER — Encounter: Payer: Self-pay | Admitting: Family Medicine

## 2019-09-08 ENCOUNTER — Ambulatory Visit: Payer: BC Managed Care – PPO | Admitting: Family Medicine

## 2019-09-08 ENCOUNTER — Other Ambulatory Visit: Payer: Self-pay

## 2019-09-08 VITALS — BP 115/73 | HR 61 | Temp 97.9°F | Ht 69.0 in | Wt 353.0 lb

## 2019-09-08 DIAGNOSIS — Z6841 Body Mass Index (BMI) 40.0 and over, adult: Secondary | ICD-10-CM | POA: Diagnosis not present

## 2019-09-08 DIAGNOSIS — E785 Hyperlipidemia, unspecified: Secondary | ICD-10-CM | POA: Diagnosis not present

## 2019-09-08 DIAGNOSIS — E1165 Type 2 diabetes mellitus with hyperglycemia: Secondary | ICD-10-CM

## 2019-09-08 NOTE — Progress Notes (Signed)
Subjective:  Patient ID: Henry Navy., male    DOB: 1960/12/29  Age: 59 y.o. MRN: [id number]  CC:  Chief Complaint  Patient presents with  . Diabetes    2 m f/u. home reading less than 160 blood sugar    HPI Henry Hinton. presents for   Diabetes: With obesity, complicated by hyperglycemia. Fructosamine was normal March 29th. Previous A1c elevation thought in part due to dexamethasone treatment at time of Covid infection in January.   Trulicity has been increased to 1.5 mg/week.  Continued on Metformin 1000 mg twice daily, and he had restarted water-based exercise. Still exercising - swimming QD.  No new med side effects.  He is on statin.  Microalbumin: Normal ratio 09/27/2018 Optho, foot exam, pneumovax: Up-to-date except foot exam today.  Home readings - 110's, max 158-160 - diet dependent.   Losing weight - goal 320 at Christmas, then 300 next year. Ideally 230 as goal.  Weights 2 days per week, planning on elliptical.  16# weight loss.   Cutting back on carbs.   Wt Readings from Last 3 Encounters:  09/08/19 (!) 353 lb (160.1 kg)  07/07/19 (!) 369 lb (167.4 kg)  06/23/19 (!) 374 lb (169.6 kg)    Diabetic Foot Exam - Simple   No data filed       Lab Results  Component Value Date   HGBA1C 9.7 (H) 06/23/2019   HGBA1C 8.4 (H) 03/20/2019   HGBA1C 8.3 (H) 12/25/2018   Lab Results  Component Value Date   MICROALBUR 0.7 02/24/2016   LDLCALC 131 (H) 03/20/2019   CREATININE 0.72 (L) 07/28/2019    Hyperlipidemia: Lipitor 10 mg  QOD..  Due for repeat testing, last LDL 131 in November 20.   Also losing weight as above. Occasional myalgia.  Lab Results  Component Value Date   CHOL 204 (H) 03/20/2019   HDL 47 03/20/2019   LDLCALC 131 (H) 03/20/2019   TRIG 146 03/20/2019   CHOLHDL 4.3 03/20/2019   Lab Results  Component Value Date   ALT 52 (H) 05/21/2019   AST 39 05/21/2019   ALKPHOS 96 05/21/2019   BILITOT 0.8 05/21/2019      History  Patient Active Problem List   Diagnosis Date Noted  . Acute hypoxemic respiratory failure due to COVID-19 ([place]) 05/18/2019  . BPH without obstruction/lower urinary tract symptoms 03/14/2018  . Family history of prostate cancer 03/14/2018  . History of nephrolithiasis 03/14/2018  . Microscopic hematuria 03/14/2018  . Diarrhea 11/20/2017  . Diabetes mellitus without complication ([place]) 40/81/4481  . Iron deficiency anemia 11/19/2017  . Influenza, pneumonia 09/22/2017  . Abdominal wall cellulitis 01/22/2017  . Insect bite 01/22/2017  . Hyperkalemia 01/22/2017  . OSA (obstructive sleep apnea) 01/22/2017  . Essential hypertension   . Hoarseness 06/08/2016  . Vocal cord paralysis 01/31/2016  . Hypercalciuria 01/10/2016  . Cellulitis 11/28/2015  . Cellulitis of trunk 11/27/2015  . Nonspecific abnormal finding in stool contents   . Colon cancer screening   . Venous stasis   . History of DVT (deep vein thrombosis)   . Sepsis ([place]) 10/09/2015  . Panniculitis 10/09/2015  . CLL (chronic lymphocytic leukemia) ([place]) 05/27/2015  . 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 Calculus of kidney 10/27/2014  . Leukocytosis (leucocytosis) 08/29/2014  . HNP (herniated nucleus pulposus), cervical 01/21/2013  . bilat hip replacement 04/08/2012  . Morbid obesity ([place]) 04/08/2012  . Edema 04/08/2012   Past Medical History:  Diagnosis Date  . Anemia   .  Arthritis   . BMI 50.0-59.9, adult ([place])   . Cellulitis 11/2015   trunk  . cll 11/2014   CLL - 11/30/2014- Dr. [facility] [place], [place]  . DVT (deep venous thrombosis) ([place]) 6/16   Rt calf  took xarelto for 6 months  . Family history of anesthesia complication    " father having delirium after anesthesia"  . Heart murmur    Hx; of as a child  . History of kidney stones    x2  . Joint pain    Hx: of periodically  . Numbness and tingling    Hx: of in right arm  . Pneumonia   . Sleep apnea    Cpap use- settings 10   Past Surgical  History:  Procedure Laterality Date  . BACK SURGERY    . COLONOSCOPY     Hx: of  . COLONOSCOPY N/A 11/23/2015   Procedure: COLONOSCOPY;  Surgeon: Irene Shipper, MD;  Location: [facility];  Service: Endoscopy;  Laterality: N/A;  . INGUINAL HERNIA REPAIR Left 06/01/2015   Procedure: LEFT INGUINAL HERNIA REPAIR WITH MESH;  Surgeon: Autumn Messing III, MD;  Location: [facility];  Service: General;  Laterality: Left;  . INGUINAL HERNIA REPAIR    . INSERTION OF MESH Left 06/01/2015   Procedure: INSERTION OF MESH;  Surgeon: Autumn Messing III, MD;  Location: [facility];  Service: General;  Laterality: Left;  . JOINT REPLACEMENT Bilateral    BTHA  . KNEE ARTHROSCOPY Left 07/21/2014   Dr. Noemi Chapel  . LUMBAR LAMINECTOMY  2005  . POSTERIOR CERVICAL LAMINECTOMY WITH MET- RX Right 01/21/2013   Procedure: Right Sitting C6-7 Microdiskectomy with Met-rex;  Surgeon: Kristeen Miss, MD;  Location: [facility];  Service: Neurosurgery;  Laterality: Right;  Right Sitting C6-7 Microdiskectomy with Met-rex  . STENT PLACEMENT RT URETER ([place] [facility])  10/23/2014  . tenosynovitis  2000   Hx: of thumb  . TONSILLECTOMY    . TOTAL HIP ARTHROPLASTY Bilateral   . WISDOM TOOTH EXTRACTION  1974   all 4   Allergies  Allergen Reactions  . Adhesive [Tape] Other (See Comments)    Blisters and rash at site   Prior to Admission medications   Medication Sig Start Date End Date Taking? Authorizing Provider  Ascorbic Acid (VITAMIN C) 1000 MG tablet Take 1,000 mg by mouth daily.   Yes [provider]  aspirin EC 81 MG tablet Take 81 mg by mouth daily.   Yes [provider]  atorvastatin (LIPITOR) 10 MG tablet TAKE 1 TABLET BY MOUTH EVERY DAY 08/11/19  Yes [name], MD  Dulaglutide (TRULICITY) 1.5 NI/7.7OE SOPN Inject 1.5 mg into the skin once a week. 08/23/19  Yes [name], MD  ferrous sulfate (IRON SUPPLEMENT) 325 (65 FE) MG tablet Take 325 mg by mouth daily with breakfast.   Yes [provider]   fluticasone (FLONASE) 50 MCG/ACT nasal spray SPRAY 2 SPRAYS INTO EACH NOSTRIL EVERY DAY 10/25/18  Yes [name], MD  furosemide (LASIX) 80 MG tablet Take 1 tablet (80 mg total) by mouth 2 (two) times daily. 03/20/19  Yes [name], MD  ibuprofen (ADVIL,MOTRIN) 200 MG tablet Take 600 mg by mouth every 6 (six) hours as needed for mild pain.   Yes [provider]  metFORMIN (GLUCOPHAGE) 1000 MG tablet TAKE 1 TABLET BY MOUTH 2 TIMES DAILY WITH A MEAL. 06/23/19  Yes [name], MD  Multiple Vitamins-Minerals (MULTIVITAMIN WITH MINERALS) tablet  Take 1 tablet by mouth daily.   Yes [provider]  potassium chloride SA (KLOR-CON M20) 20 MEQ tablet Take 1 tablet (20 mEq total) by mouth 2 (two) times daily. 03/20/19  Yes [name], MD  Zinc 15 MG CAPS  05/22/19  Yes [provider]   Social History   Socioeconomic History  . Marital status: Divorced    Spouse name: Not on file  . Number of children: 0  . Years of education: Not on file  . Highest education level: Not on file  Occupational History  . Occupation: Geophysical data processor: [employer]  Tobacco Use  . Smoking status: Never Smoker  . Smokeless tobacco: Former Systems developer    Types: Chew  Substance and Sexual Activity  . Alcohol use: Yes    Alcohol/week: 1.0 standard drinks    Types: 1 Standard drinks or equivalent per week    Comment: BE[employer]QU[employer]  . Drug use: No  . Sexual activity: Not Currently  Other Topics Concern  . Not on file  Social History Narrative   Exercise swimming 3-5 times/week for 45 minutes   Social Determinants of Health   Financial Resource Strain:   . Difficulty of Paying Living Expenses:   Food Insecurity:   . Worried About Charity fundraiser in the Last Year:   . Arboriculturist in the Last Year:   Transportation Needs:   . Film/video editor (Medical):   Marland Kitchen Lack of Transportation (Non-Medical):   Physical Activity:    . Days of Exercise per Week:   . Minutes of Exercise per Session:   Stress:   . Feeling of Stress :   Social Connections:   . Frequency of Communication with Friends and Family:   . Frequency of Social Gatherings with Friends and Family:   . Attends Religious Services:   . Active Member of Clubs or Organizations:   . Attends Archivist Meetings:   Marland Kitchen Marital Status:   Intimate Partner Violence:   . Fear of Current or Ex-Partner:   . Emotionally Abused:   Marland Kitchen Physically Abused:   . Sexually Abused:     Review of Systems  Constitutional: Negative for fatigue and unexpected weight change.  Eyes: Negative for visual disturbance.  Respiratory: Negative for cough, chest tightness and shortness of breath.   Cardiovascular: Negative for chest pain, palpitations and leg swelling.  Gastrointestinal: Negative for abdominal pain and blood in stool.  Neurological: Negative for dizziness, light-headedness and headaches.     Objective:   Vitals:   09/08/19 0819  BP: 115/73  Pulse: 61  Temp: 97.9 F (36.6 C)  TempSrc: Temporal  SpO2: 96%  Weight: (!) 353 lb (160.1 kg)  Height: '5\' 9"'  (1.753 m)     Physical Exam Vitals reviewed.  Constitutional:      General: He is not in acute distress.    Appearance: He is well-developed. He is obese. He is not toxic-appearing.  HENT:     Head: Normocephalic and atraumatic.  Eyes:     Pupils: Pupils are equal, round, and reactive to light.  Neck:     Vascular: No carotid bruit or JVD.  Cardiovascular:     Rate and Rhythm: Normal rate and regular rhythm.     Heart sounds: Normal heart sounds. No murmur.  Pulmonary:     Effort: Pulmonary effort is normal.     Breath sounds: Normal breath sounds. No rales.  Musculoskeletal:     Right lower leg: Edema present.     Left lower leg: Edema (min at ankles with stasis changes. ) present.  Skin:    General: Skin is warm and dry.  Neurological:     Mental Status: He is alert and oriented  to person, place, and time.  Psychiatric:        Mood and Affect: Mood normal.        Behavior: Behavior normal.     Assessment & Plan:  Henry Hinton. is a 59 y.o. male . Type 2 diabetes mellitus with hyperglycemia, without long-term current use of insulin (HCC) - Plan: Microalbumin / creatinine urine ratio, Hemoglobin A1c  - improving home readings. The current medical regimen is effective;  continue present plan and medications. Recheck in 3 months.   BMI 50.0-59.9, adult ([place])  - commended on weight loss, continued exercise   Hyperlipidemia, unspecified hyperlipidemia type - Plan: Comprehensive metabolic panel, Lipid panel  - occasional myalgia. Anticipate improved readings with weight loss as well.   Recommended covid vaccine as 3 months out from infection.   No orders of the defined types were placed in this encounter.  Patient Instructions       If you have lab work done today you will be contacted with your lab results within the next 2 weeks.  If you have not heard from [place] then please contact us. The fastest way to get your results is to register for My Chart.   IF you received an x-ray today, you will receive an invoice from [facility]. Please contact [facility] at [phone] with questions or concerns regarding your invoice.   IF you received labwork today, you will receive an invoice from [place]. Please contact [facility] at [phone] with questions or concerns regarding your invoice.   Our billing staff will not be able to assist you with questions regarding bills from these companies.  You will be contacted with the lab results as soon as they are available. The fastest way to get your results is to activate your My Chart account. Instructions are located on the last page of this paperwork. If you have not heard from [place] regarding the results in 2 weeks, please contact this office.          Signed, [name], MD [facility]

## 2019-09-08 NOTE — Patient Instructions (Addendum)
   Keep up the good work with swimming/exercise. I expect the A1c will be better as well as your cholesterol.   If you have lab work done today you will be contacted with your lab results within the next 2 weeks.  If you have not heard from [place] then please contact us. The fastest way to get your results is to register for My Chart.   IF you received an x-ray today, you will receive an invoice from [facility]. Please contact [facility] at [phone] with questions or concerns regarding your invoice.   IF you received labwork today, you will receive an invoice from [place]. Please contact [facility] at [phone] with questions or concerns regarding your invoice.   Our billing staff will not be able to assist you with questions regarding bills from these companies.  You will be contacted with the lab results as soon as they are available. The fastest way to get your results is to activate your My Chart account. Instructions are located on the last page of this paperwork. If you have not heard from [place] regarding the results in 2 weeks, please contact this office.

## 2019-09-09 LAB — MICROALBUMIN / CREATININE URINE RATIO
Creatinine, Urine: 127.6 mg/dL
Microalb/Creat Ratio: 5 mg/g creat (ref 0–29)
Microalbumin, Urine: 6.8 ug/mL

## 2019-09-09 LAB — HEMOGLOBIN A1C
Est. average glucose Bld gHb Est-mCnc: 169 mg/dL
Hgb A1c MFr Bld: 7.5 % — ABNORMAL HIGH (ref 4.8–5.6)

## 2019-09-09 LAB — COMPREHENSIVE METABOLIC PANEL
ALT: 26 IU/L (ref 0–44)
AST: 16 IU/L (ref 0–40)
Albumin/Globulin Ratio: 1.5 (ref 1.2–2.2)
Albumin: 4.2 g/dL (ref 3.8–4.9)
Alkaline Phosphatase: 130 IU/L — ABNORMAL HIGH (ref 39–117)
BUN/Creatinine Ratio: 24 — ABNORMAL HIGH (ref 9–20)
BUN: 17 mg/dL (ref 6–24)
Bilirubin Total: 0.3 mg/dL (ref 0.0–1.2)
CO2: 22 mmol/L (ref 20–29)
Calcium: 8.9 mg/dL (ref 8.7–10.2)
Chloride: 102 mmol/L (ref 96–106)
Creatinine, Ser: 0.7 mg/dL — ABNORMAL LOW (ref 0.76–1.27)
GFR calc Af Amer: 119 mL/min/{1.73_m2} (ref >59)
GFR calc non Af Amer: 103 mL/min/{1.73_m2} (ref >59)
Globulin, Total: 2.8 g/dL (ref 1.5–4.5)
Glucose: 145 mg/dL — ABNORMAL HIGH (ref 65–99)
Potassium: 4.5 mmol/L (ref 3.5–5.2)
Sodium: 141 mmol/L (ref 134–144)
Total Protein: 7 g/dL (ref 6.0–8.5)

## 2019-09-09 LAB — LIPID PANEL
Chol/HDL Ratio: 3.3 ratio (ref 0.0–5.0)
Cholesterol, Total: 167 mg/dL (ref 100–199)
HDL: 50 mg/dL (ref >39)
LDL Chol Calc (NIH): 96 mg/dL (ref 0–99)
Triglycerides: 117 mg/dL (ref 0–149)
VLDL Cholesterol Cal: 21 mg/dL (ref 5–40)

## 2019-10-07 ENCOUNTER — Other Ambulatory Visit: Payer: Self-pay | Admitting: Family Medicine

## 2019-10-07 DIAGNOSIS — R609 Edema, unspecified: Secondary | ICD-10-CM

## 2019-10-07 NOTE — Telephone Encounter (Signed)
Requested Prescriptions  Pending Prescriptions Disp Refills  . KLOR-CON M20 20 MEQ tablet [Pharmacy Med Name: KLOR-CON M20 TABLET] 180 tablet 1    Sig: TAKE 1 TABLET BY MOUTH TWICE A DAY     Endocrinology:  Minerals - Potassium Supplementation Failed - 10/07/2019  1:33 AM      Failed - Cr in normal range and within 360 days    Creat  Date Value Ref Range Status  02/24/2016 0.71 0.70 - 1.33 mg/dL Final    Comment:      For patients > or = 59 years of age: The upper reference limit for Creatinine is approximately 13% higher for people identified as African-American.      Creatinine, Ser  Date Value Ref Range Status  09/08/2019 0.70 (L) 0.76 - 1.27 mg/dL Final         Passed - K in normal range and within 360 days    Potassium  Date Value Ref Range Status  09/08/2019 4.5 3.5 - 5.2 mmol/L Final         Passed - Valid encounter within last 12 months    Recent Outpatient Visits          4 weeks ago Type 2 diabetes mellitus with hyperglycemia, without long-term current use of insulin [facility])   Primary [facility] at [facility], Ranell Patrick, MD   2 months ago Type 2 diabetes mellitus with hyperglycemia, without long-term current use of insulin [facility])   Primary Care at [facility], Zoe A, MD   3 months ago Type 2 diabetes mellitus with hyperglycemia, without long-term current use of insulin [facility])   Primary Care at [facility], Ranell Patrick, MD   3 months ago Cellulitis of leg, right   Primary Care at [facility], Ranell Patrick, MD   4 months ago COVID-19   Primary Care at [facility], Ranell Patrick, MD      Future Appointments            In 2 months Carlota Raspberry Ranell Patrick, MD Primary Care at [place], [facility]

## 2019-11-05 ENCOUNTER — Other Ambulatory Visit: Payer: Self-pay | Admitting: Family Medicine

## 2019-11-05 DIAGNOSIS — R609 Edema, unspecified: Secondary | ICD-10-CM

## 2019-11-05 NOTE — Telephone Encounter (Signed)
Requested Prescriptions  Pending Prescriptions Disp Refills   furosemide (LASIX) 80 MG tablet [Pharmacy Med Name: FUROSEMIDE 80 MG TABLET] 180 tablet 1    Sig: TAKE 1 TABLET (80 MG TOTAL) BY MOUTH 2 (TWO) TIMES DAILY.     Cardiovascular:  Diuretics - Loop Failed - 11/05/2019  1:31 AM      Failed - Cr in normal range and within 360 days    Creat  Date Value Ref Range Status  02/24/2016 0.71 0.70 - 1.33 mg/dL Final    Comment:      For patients > or = 59 years of age: The upper reference limit for Creatinine is approximately 13% higher for people identified as African-American.      Creatinine, Ser  Date Value Ref Range Status  09/08/2019 0.70 (L) 0.76 - 1.27 mg/dL Final         Passed - K in normal range and within 360 days    Potassium  Date Value Ref Range Status  09/08/2019 4.5 3.5 - 5.2 mmol/L Final         Passed - Ca in normal range and within 360 days    Calcium  Date Value Ref Range Status  09/08/2019 8.9 8.7 - 10.2 mg/dL Final   Calcium, Ion  Date Value Ref Range Status  10/09/2015 1.04 (L) 1.12 - 1.23 mmol/L Final         Passed - Na in normal range and within 360 days    Sodium  Date Value Ref Range Status  09/08/2019 141 134 - 144 mmol/L Final         Passed - Last BP in normal range    BP Readings from Last 1 Encounters:  09/08/19 115/73         Passed - Valid encounter within last 6 months    Recent Outpatient Visits          1 month ago Type 2 diabetes mellitus with hyperglycemia, without long-term current use of insulin ([place])   Primary Care at [facility], Ranell Patrick, MD   3 months ago Type 2 diabetes mellitus with hyperglycemia, without long-term current use of insulin ([place])   Primary Care at [facility], Zoe A, MD   4 months ago Type 2 diabetes mellitus with hyperglycemia, without long-term current use of insulin [facility])   Primary Care at [facility], Ranell Patrick, MD   4 months ago Cellulitis of leg, right   Primary Care at [facility], Ranell Patrick, MD   5 months ago COVID-19   Primary Care at [facility], Ranell Patrick, MD      Future Appointments            In 1 month Carlota Raspberry Ranell Patrick, MD Primary Care at [place], [facility]

## 2019-11-09 ENCOUNTER — Other Ambulatory Visit: Payer: Self-pay | Admitting: Family Medicine

## 2019-11-09 DIAGNOSIS — E785 Hyperlipidemia, unspecified: Secondary | ICD-10-CM

## 2019-11-09 NOTE — Telephone Encounter (Signed)
Requested Prescriptions  Pending Prescriptions Disp Refills  . atorvastatin (LIPITOR) 10 MG tablet [Pharmacy Med Name: ATORVASTATIN 10 MG TABLET] 90 tablet 2    Sig: TAKE 1 TABLET BY MOUTH EVERY DAY     Cardiovascular:  Antilipid - Statins Failed - 11/09/2019 12:49 AM      Failed - LDL in normal range and within 360 days    LDL Chol Calc (NIH)  Date Value Ref Range Status  09/08/2019 96 0 - 99 mg/dL Final         Passed - Total Cholesterol in normal range and within 360 days    Cholesterol, Total  Date Value Ref Range Status  09/08/2019 167 100 - 199 mg/dL Final         Passed - HDL in normal range and within 360 days    HDL  Date Value Ref Range Status  09/08/2019 50 >39 mg/dL Final         Passed - Triglycerides in normal range and within 360 days    Triglycerides  Date Value Ref Range Status  09/08/2019 117 0 - 149 mg/dL Final         Passed - Patient is not pregnant      Passed - Valid encounter within last 12 months    Recent Outpatient Visits          2 months ago Type 2 diabetes mellitus with hyperglycemia, without long-term current use of insulin ([place])   Primary Care at [facility], Ranell Patrick, MD   3 months ago Type 2 diabetes mellitus with hyperglycemia, without long-term current use of insulin ([place])   Primary Care at [facility], [place], MD   4 months ago Type 2 diabetes mellitus with hyperglycemia, without long-term current use of insulin [facility])   Primary Care at [facility], Ranell Patrick, MD   4 months ago Cellulitis of leg, right   Primary Care at [facility], Ranell Patrick, MD   5 months ago COVID-19   Primary Care at [facility], Ranell Patrick, MD      Future Appointments            In 1 month Carlota Raspberry Ranell Patrick, MD Primary Care at [place], [facility]

## 2019-12-12 ENCOUNTER — Other Ambulatory Visit: Payer: Self-pay | Admitting: Family Medicine

## 2019-12-12 DIAGNOSIS — E1165 Type 2 diabetes mellitus with hyperglycemia: Secondary | ICD-10-CM

## 2019-12-17 ENCOUNTER — Encounter: Payer: Self-pay | Admitting: Family Medicine

## 2019-12-17 ENCOUNTER — Ambulatory Visit: Payer: BC Managed Care – PPO | Admitting: Family Medicine

## 2019-12-17 ENCOUNTER — Telehealth: Payer: Self-pay | Admitting: Family Medicine

## 2019-12-17 ENCOUNTER — Other Ambulatory Visit: Payer: Self-pay

## 2019-12-17 VITALS — BP 132/78 | HR 60 | Temp 97.7°F | Resp 15 | Ht 69.0 in | Wt 347.2 lb

## 2019-12-17 DIAGNOSIS — E1165 Type 2 diabetes mellitus with hyperglycemia: Secondary | ICD-10-CM

## 2019-12-17 DIAGNOSIS — Z6841 Body Mass Index (BMI) 40.0 and over, adult: Secondary | ICD-10-CM

## 2019-12-17 DIAGNOSIS — R21 Rash and other nonspecific skin eruption: Secondary | ICD-10-CM

## 2019-12-17 MED ORDER — METRONIDAZOLE 1 % EX GEL: Freq: Every day | CUTANEOUS | 0 refills | Status: DC

## 2019-12-17 MED ORDER — MUPIROCIN 2 % EX OINT: 1.0000 "application " | TOPICAL_OINTMENT | Freq: Three times a day (TID) | CUTANEOUS | 1 refills | Status: DC

## 2019-12-17 MED ORDER — TRULICITY 1.5 MG/0.5ML ~~LOC~~ SOAJ: 1.5000 mg | SUBCUTANEOUS | 1 refills | Status: DC

## 2019-12-17 MED ORDER — METFORMIN HCL 1000 MG PO TABS: ORAL_TABLET | ORAL | 1 refills | Status: DC

## 2019-12-17 NOTE — Telephone Encounter (Signed)
fasting labs for physical scheduled for 03/22/2020

## 2019-12-17 NOTE — Patient Instructions (Addendum)
Try disposable mask and change daily.  Mupirocin to area three times per day, until rash resolves, then if returns can use for 5-7 days at a time. If you continue to have irritation in those areas, can fill metronidazole to use daily for possible rosacea. Return to the clinic or go to the nearest emergency room if any of your symptoms worsen or new symptoms occur(including any spreading redness to other parts of face).   No change in diabetes meds. Keep up exercise.  I will let you know if there are any concerns on A1c.    If you have lab work done today you will be contacted with your lab results within the next 2 weeks.  If you have not heard from [place] then please contact us. The fastest way to get your results is to register for My Chart.   IF you received an x-ray today, you will receive an invoice from [facility]. Please contact [facility] at [phone] with questions or concerns regarding your invoice.   IF you received labwork today, you will receive an invoice from [place]. Please contact [facility] at [phone] with questions or concerns regarding your invoice.   Our billing staff will not be able to assist you with questions regarding bills from these companies.  You will be contacted with the lab results as soon as they are available. The fastest way to get your results is to activate your My Chart account. Instructions are located on the last page of this paperwork. If you have not heard from [place] regarding the results in 2 weeks, please contact this office.

## 2019-12-17 NOTE — Progress Notes (Signed)
Subjective:  Patient ID: Henry Hinton., male    DOB: 1961-04-24  Age: 59 y.o. MRN: [id number]  CC:  Chief Complaint  Patient presents with  . Diabetes    pt has been checking sugar at home about 108-125 pt has been trying to exercise and modify diet to improve numbers, has been swimming daily   . Rash    pt has been swimming daily and has gotten a rash on the side of his nose on and off all summer pt reports he dose shower after getting out of the pool but believes this is a reaction to the chlorine, pt also reports pustules in the area as well where he has previously not gotten them current duration about 1 week    HPI [employer]. presents for   Diabetes: With obesity, complicated by hyperglycemia.  Spike in A1c earlier this year thought to be due to dexamethasone treatment at time of Covid infection in January.  Treated with Metformin 1000 mg twice daily, Trulicity 1.5 mg weekly.  water-based exercise, swimming daily.  Has lost 6 pounds since May. No specific diet for now. Has relaxed diet some in summer.   He is on statin, Lipitor every other day  Microalbumin: Normal ratio 09/27/2018 Readings - 104-124 including postprandial. No lows.  Optho, foot exam, pneumovax:  Diabetic Foot Exam - Simple   Simple Foot Form Visual Inspection See comments: Yes Sensation Testing Intact to touch and monofilament testing bilaterally: Yes Pulse Check Posterior Tibialis and Dorsalis pulse intact bilaterally: Yes Comments pts toenails are discolored and thick, pt has some edema at the ankle and foot bilaterally otherwise normal     Wt Readings from Last 3 Encounters:  12/17/19 (!) 347 lb 3.2 oz (157.5 kg)  09/08/19 (!) 353 lb (160.1 kg)  07/07/19 (!) 369 lb (167.4 kg)    Lab Results  Component Value Date   HGBA1C 7.5 (H) 09/08/2019   HGBA1C 9.7 (H) 06/23/2019   HGBA1C 8.4 (H) 03/20/2019   Lab Results  Component Value Date   MICROALBUR 0.7 02/24/2016   LDLCALC 96  09/08/2019   CREATININE 0.70 (L) 09/08/2019   Nasal rash: Intermittent pustules on the side of his nose swimming.  Thought to be due to chlorine although he does shower off after swimming. More past week - different pool, but also wearing mask and sweating with outside work. Changes about once per week. No fever. More past week.  Tx: alcohol and cotton swab.   covid vaccine 12/06/19, disease in January.   History Patient Active Problem List   Diagnosis Date Noted  . Acute hypoxemic respiratory failure due to COVID-19 ([place]) 05/18/2019  . BPH without obstruction/lower urinary tract symptoms 03/14/2018  . Family history of prostate cancer 03/14/2018  . History of nephrolithiasis 03/14/2018  . Microscopic hematuria 03/14/2018  . Diarrhea 11/20/2017  . Diabetes mellitus without complication ([place]) 79/15/0569  . Iron deficiency anemia 11/19/2017  . Influenza, pneumonia 09/22/2017  . Abdominal wall cellulitis 01/22/2017  . Insect bite 01/22/2017  . Hyperkalemia 01/22/2017  . OSA (obstructive sleep apnea) 01/22/2017  . Essential hypertension   . Hoarseness 06/08/2016  . Vocal cord paralysis 01/31/2016  . Hypercalciuria 01/10/2016  . Cellulitis 11/28/2015  . Cellulitis of trunk 11/27/2015  . Nonspecific abnormal finding in stool contents   . Colon cancer screening   . Venous stasis   . History of DVT (deep vein thrombosis)   . Sepsis ([place]) 10/09/2015  . Panniculitis 10/09/2015  .  CLL (chronic lymphocytic leukemia) ([place]) 05/27/2015  . 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 Calculus of kidney 10/27/2014  . Leukocytosis (leucocytosis) 08/29/2014  . HNP (herniated nucleus pulposus), cervical 01/21/2013  . bilat hip replacement 04/08/2012  . Morbid obesity ([place]) 04/08/2012  . Edema 04/08/2012   Past Medical History:  Diagnosis Date  . Anemia   . Arthritis   . BMI 50.0-59.9, adult ([place])   . Cellulitis 11/2015   trunk  . cll 11/2014   CLL - 11/30/2014- Dr. [facility] [place],  [place]  . DVT (deep venous thrombosis) ([place]) 6/16   Rt calf  took xarelto for 6 months  . Family history of anesthesia complication    " father having delirium after anesthesia"  . Heart murmur    Hx; of as a child  . History of kidney stones    x2  . Joint pain    Hx: of periodically  . Numbness and tingling    Hx: of in right arm  . Pneumonia   . Sleep apnea    Cpap use- settings 10   Past Surgical History:  Procedure Laterality Date  . BACK SURGERY    . COLONOSCOPY     Hx: of  . COLONOSCOPY N/A 11/23/2015   Procedure: COLONOSCOPY;  Surgeon: Irene Shipper, MD;  [facility];  Service: Endoscopy;  Laterality: N/A;  . INGUINAL HERNIA REPAIR Left 06/01/2015   Procedure: LEFT INGUINAL HERNIA REPAIR WITH MESH;  Surgeon: Autumn Messing III, MD;  Location: [facility];  Service: General;  Laterality: Left;  . INGUINAL HERNIA REPAIR    . INSERTION OF MESH Left 06/01/2015   Procedure: INSERTION OF MESH;  Surgeon: Autumn Messing III, MD;  [facility];  Service: General;  Laterality: Left;  . JOINT REPLACEMENT Bilateral    BTHA  . KNEE ARTHROSCOPY Left 07/21/2014   Dr. Noemi Chapel  . LUMBAR LAMINECTOMY  2005  . POSTERIOR CERVICAL LAMINECTOMY WITH MET- RX Right 01/21/2013   Procedure: Right Sitting C6-7 Microdiskectomy with Met-rex;  Surgeon: Kristeen Miss, MD;  Location: [facility];  Service: Neurosurgery;  Laterality: Right;  Right Sitting C6-7 Microdiskectomy with Met-rex  . STENT PLACEMENT RT URETER ([place] HX)  10/23/2014  . tenosynovitis  2000   Hx: of thumb  . TONSILLECTOMY    . TOTAL HIP ARTHROPLASTY Bilateral   . WISDOM TOOTH EXTRACTION  1974   all 4   Allergies  Allergen Reactions  . Adhesive [Tape] Other (See Comments)    Blisters and rash at site   Prior to Admission medications   Medication Sig Start Date End Date Taking? Authorizing Provider  Ascorbic Acid (VITAMIN C) 1000 MG tablet Take 1,000 mg by mouth daily.   Yes [provider]  aspirin EC 81 MG tablet Take 81  mg by mouth daily.   Yes [provider]  atorvastatin (LIPITOR) 10 MG tablet TAKE 1 TABLET BY MOUTH EVERY DAY 11/09/19  Yes [name], MD  Dulaglutide (TRULICITY) 1.5 BD/5.3GD SOPN Inject 1.5 mg into the skin once a week. 08/23/19  Yes [name], MD  ferrous sulfate (IRON SUPPLEMENT) 325 (65 FE) MG tablet Take 325 mg by mouth daily with breakfast.   Yes [provider]  fluticasone (FLONASE) 50 MCG/ACT nasal spray SPRAY 2 SPRAYS INTO EACH NOSTRIL EVERY DAY 10/25/18  Yes [name], MD  furosemide (LASIX) 80 MG tablet TAKE 1 TABLET (80 MG TOTAL) BY MOUTH 2 (TWO) TIMES DAILY. 11/05/19  Yes Carlota Raspberry,  Ranell Patrick, MD  ibuprofen (ADVIL,MOTRIN) 200 MG tablet Take 600 mg by mouth every 6 (six) hours as needed for mild pain.   Yes [provider]  KLOR-CON M20 20 MEQ tablet TAKE 1 TABLET BY MOUTH TWICE A DAY 10/07/19  Yes [name], MD  metFORMIN (GLUCOPHAGE) 1000 MG tablet TAKE 1 TABLET BY MOUTH 2 TIMES DAILY WITH A MEAL. 06/23/19  Yes [name], MD  Multiple Vitamins-Minerals (MULTIVITAMIN WITH MINERALS) tablet Take 1 tablet by mouth daily.   Yes [provider]  Zinc 15 MG CAPS  05/22/19  Yes [provider]   Social History   Socioeconomic History  . Marital status: Divorced    Spouse name: Not on file  . Number of children: 0  . Years of education: Not on file  . Highest education level: Not on file  Occupational History  . Occupation: Geophysical data processor: [employer]  Tobacco Use  . Smoking status: Never Smoker  . Smokeless tobacco: Former Systems developer    Types: Secondary school teacher  . Vaping Use: Never used  Substance and Sexual Activity  . Alcohol use: Yes    Alcohol/week: 1.0 standard drink    Types: 1 Standard drinks or equivalent per week    Comment: BEER AND LIQUOR  . Drug use: No  . Sexual activity: Not Currently  Other Topics Concern  . Not on file  Social History Narrative   Exercise  swimming 3-5 times/week for 45 minutes   Social Determinants of Health   Financial Resource Strain:   . Difficulty of Paying Living Expenses:   Food Insecurity:   . Worried About Charity fundraiser in the Last Year:   . Arboriculturist in the Last Year:   Transportation Needs:   . Film/video editor (Medical):   Marland Kitchen Lack of Transportation (Non-Medical):   Physical Activity:   . Days of Exercise per Week:   . Minutes of Exercise per Session:   Stress:   . Feeling of Stress :   Social Connections:   . Frequency of Communication with Friends and Family:   . Frequency of Social Gatherings with Friends and Family:   . Attends Religious Services:   . Active Member of Clubs or Organizations:   . Attends Archivist Meetings:   Marland Kitchen Marital Status:   Intimate Partner Violence:   . Fear of Current or Ex-Partner:   . Emotionally Abused:   Marland Kitchen Physically Abused:   . Sexually Abused:     Review of Systems   Objective:   Vitals:   12/17/19 0854  BP: 132/78  Pulse: 60  Resp: 15  Temp: 97.7 F (36.5 C)  TempSrc: Temporal  SpO2: 97%  Weight: (!) 347 lb 3.2 oz (157.5 kg)  Height: $Remove'5\' 9"'PsZKrzS$  (1.753 m)     Physical Exam Vitals reviewed.  Constitutional:      Appearance: He is well-developed. He is obese.  HENT:     Head: Normocephalic and atraumatic.  Eyes:     Pupils: Pupils are equal, round, and reactive to light.  Neck:     Vascular: No carotid bruit or JVD.  Cardiovascular:     Rate and Rhythm: Normal rate and regular rhythm.     Heart sounds: Normal heart sounds. No murmur heard.   Pulmonary:     Effort: Pulmonary effort is normal.     Breath sounds: Normal breath sounds. No rales.  Skin:  General: Skin is warm and dry.     Findings: Rash present.     Comments: See photo. Few pustules, erythema on lateral nose, some telangiectasias.   Neurological:     Mental Status: He is alert and oriented to person, place, and time.  Psychiatric:        Mood and  Affect: Mood normal.          36 minutes spent during visit, greater than 50% counseling and assimilation of information, chart review, and discussion of plan.   Assessment & Plan:  Daran Favaro. is a 59 y.o. male .  Rash of face - Plan: metroNIDAZOLE (METROGEL) 1 % gel: mupirocin ointment (BACTROBAN) 2 %  -Contact dermatitis from mask use versus rosacea.  Had not had significant issues in the past so less likely rosacea.  Does not have apparent significant cellulitis at this time.   -Trial of Bactroban 3 times daily for the next week, and then as needed for flares.  -Disposable mask, change daily  -If persistent symptoms could try metronidazole gel, printed, for possible rosacea.  Triggers discussed.  RTC precautions given.  Type 2 diabetes mellitus with hyperglycemia, without long-term current use of insulin (HCC) - Plan: Hemoglobin A1c BMI 50.0-59.9, adult (HCC)  -Commended on weight loss.  Continue same regimen, recheck 3 months.  A1c pending.  Meds ordered this encounter  Medications  . mupirocin ointment (BACTROBAN) 2 %    Sig: Apply 1 application topically 3 (three) times daily.    Dispense:  22 g    Refill:  1  . metroNIDAZOLE (METROGEL) 1 % gel    Sig: Apply topically daily.    Dispense:  45 g    Refill:  0   Patient Instructions   Try disposable mask and change daily.  Mupirocin to area three times per day, until rash resolves, then if returns can use for 5-7 days at a time. If you continue to have irritation in those areas, can fill metronidazole to use daily for possible rosacea. Return to the clinic or go to the nearest emergency room if any of your symptoms worsen or new symptoms occur(including any spreading redness to other parts of face).   No change in diabetes meds. Keep up exercise.  I will let you know if there are any concerns on A1c.    If you have lab work done today you will be contacted with your lab results within the next 2 weeks.  If you  have not heard from [place] then please contact us. The fastest way to get your results is to register for My Chart.   IF you received an x-ray today, you will receive an invoice from [facility]. Please contact [facility] at [phone] with questions or concerns regarding your invoice.   IF you received labwork today, you will receive an invoice from [facility]. Please contact [facility] at [phone] with questions or concerns regarding your invoice.   Our billing staff will not be able to assist you with questions regarding bills from these companies.  You will be contacted with the lab results as soon as they are available. The fastest way to get your results is to activate your My Chart account. Instructions are located on the last page of this paperwork. If you have not heard from [place] regarding the results in 2 weeks, please contact this office.         Signed, [name], MD [facility]

## 2019-12-18 LAB — HEMOGLOBIN A1C
Est. average glucose Bld gHb Est-mCnc: 163 mg/dL
Hgb A1c MFr Bld: 7.3 % — ABNORMAL HIGH (ref 4.8–5.6)

## 2020-03-01 LAB — HM DIABETES EYE EXAM

## 2020-03-18 ENCOUNTER — Other Ambulatory Visit: Payer: Self-pay

## 2020-03-18 ENCOUNTER — Ambulatory Visit (INDEPENDENT_AMBULATORY_CARE_PROVIDER_SITE_OTHER): Payer: BC Managed Care – PPO | Admitting: Family Medicine

## 2020-03-18 DIAGNOSIS — Z125 Encounter for screening for malignant neoplasm of prostate: Secondary | ICD-10-CM

## 2020-03-18 DIAGNOSIS — E1165 Type 2 diabetes mellitus with hyperglycemia: Secondary | ICD-10-CM

## 2020-03-18 DIAGNOSIS — E785 Hyperlipidemia, unspecified: Secondary | ICD-10-CM

## 2020-03-18 DIAGNOSIS — Z1211 Encounter for screening for malignant neoplasm of colon: Secondary | ICD-10-CM

## 2020-03-18 DIAGNOSIS — I10 Essential (primary) hypertension: Secondary | ICD-10-CM

## 2020-03-18 NOTE — Progress Notes (Signed)
Lab only visit - not seen.

## 2020-03-18 NOTE — Patient Instructions (Signed)
Collected blood sample from pt with assistance from A. Joya Gaskins. Pt tolerated well.

## 2020-03-19 ENCOUNTER — Telehealth (INDEPENDENT_AMBULATORY_CARE_PROVIDER_SITE_OTHER): Payer: BC Managed Care – PPO | Admitting: Family Medicine

## 2020-03-19 VITALS — Ht 69.0 in | Wt 344.0 lb

## 2020-03-19 DIAGNOSIS — J019 Acute sinusitis, unspecified: Secondary | ICD-10-CM | POA: Diagnosis not present

## 2020-03-19 LAB — CMP14+EGFR
ALT: 31 IU/L (ref 0–44)
AST: 21 IU/L (ref 0–40)
Albumin/Globulin Ratio: 1.8 (ref 1.2–2.2)
Albumin: 4.1 g/dL (ref 3.8–4.9)
Alkaline Phosphatase: 136 IU/L — ABNORMAL HIGH (ref 44–121)
BUN/Creatinine Ratio: 17 (ref 9–20)
BUN: 13 mg/dL (ref 6–24)
Bilirubin Total: 0.4 mg/dL (ref 0.0–1.2)
CO2: 24 mmol/L (ref 20–29)
Calcium: 8.6 mg/dL — ABNORMAL LOW (ref 8.7–10.2)
Chloride: 98 mmol/L (ref 96–106)
Creatinine, Ser: 0.75 mg/dL — ABNORMAL LOW (ref 0.76–1.27)
GFR calc Af Amer: 116 mL/min/{1.73_m2} (ref >59)
GFR calc non Af Amer: 100 mL/min/{1.73_m2} (ref >59)
Globulin, Total: 2.3 g/dL (ref 1.5–4.5)
Glucose: 159 mg/dL — ABNORMAL HIGH (ref 65–99)
Potassium: 4.1 mmol/L (ref 3.5–5.2)
Sodium: 138 mmol/L (ref 134–144)
Total Protein: 6.4 g/dL (ref 6.0–8.5)

## 2020-03-19 LAB — PSA: Prostate Specific Ag, Serum: 0.4 ng/mL (ref 0.0–4.0)

## 2020-03-19 LAB — LIPID PANEL
Chol/HDL Ratio: 3.3 ratio (ref 0.0–5.0)
Cholesterol, Total: 170 mg/dL (ref 100–199)
HDL: 52 mg/dL (ref >39)
LDL Chol Calc (NIH): 99 mg/dL (ref 0–99)
Triglycerides: 107 mg/dL (ref 0–149)
VLDL Cholesterol Cal: 19 mg/dL (ref 5–40)

## 2020-03-19 LAB — HEMOGLOBIN A1C
Est. average glucose Bld gHb Est-mCnc: 169 mg/dL
Hgb A1c MFr Bld: 7.5 % — ABNORMAL HIGH (ref 4.8–5.6)

## 2020-03-19 LAB — TSH: TSH: 3.27 u[IU]/mL (ref 0.450–4.500)

## 2020-03-19 MED ORDER — AMOXICILLIN-POT CLAVULANATE 875-125 MG PO TABS: 1.0000 | ORAL_TABLET | Freq: Two times a day (BID) | ORAL | 0 refills | Status: DC

## 2020-03-19 NOTE — Patient Instructions (Signed)
° ° ° °  If you have lab work done today you will be contacted with your lab results within the next 2 weeks.  If you have not heard from us then please contact us. The fastest way to get your results is to register for My Chart. ° ° °IF you received an x-ray today, you will receive an invoice from [facility]. Please contact [facility] at [phone] with questions or concerns regarding your invoice.  ° °IF you received labwork today, you will receive an invoice from [facility]. Please contact [facility] at [phone] with questions or concerns regarding your invoice.  ° °Our billing staff will not be able to assist you with questions regarding bills from these companies. ° °You will be contacted with the lab results as soon as they are available. The fastest way to get your results is to activate your My Chart account. Instructions are located on the last page of this paperwork. If you have not heard from us regarding the results in 2 weeks, please contact this office. °  ° ° ° °

## 2020-03-19 NOTE — Progress Notes (Signed)
Virtual Visit via Telephone Note  I connected with Henry Hinton. on 03/19/20 at 5:35 PM by telephone and verified that I am speaking with the correct person using two identifiers. Patient location: in truck.  My location: office   I discussed the limitations, risks, security and privacy concerns of performing an evaluation and management service by telephone and the availability of in person appointments. I also discussed with the patient that there may be a patient responsible charge related to this service. The patient expressed understanding and agreed to proceed, consent obtained  Chief complaint: Chief Complaint  Patient presents with  . Nasal Congestion    going on 24 hrs- feels like sinus infection    History of Present Illness: Henry Hinton. is a 59 y.o. male  Sinus congestion.  Hunting last weekend with friends.  friend form trip had runny nose, diagnosed with rhinovirus -  negative covid test 3 nights ago.   Initial sneezing 3 days ago, improved. Postnasal drip this am, min cough with pnd - slight yellow. Yellow nasal congestion today.  Has been able to swim, active. No dyspnea, no change in taste/smell, no headache. No covid exposure.  Last covid vaccine  - [employer] 12/06/2019. covid disease in January.    Yellow nasal d/c main symptom. No face pain, tooth pain, headache. No fever, eating and drinking ok.   Last sinus infection in 2019.   Tx: claritin, ibuprofen, and flonase.   Has appt with me on Monday.     Patient Active Problem List   Diagnosis Date Noted  . Acute hypoxemic respiratory failure due to COVID-19 ([place]) 05/18/2019  . BPH without obstruction/lower urinary tract symptoms 03/14/2018  . Family history of prostate cancer 03/14/2018  . History of nephrolithiasis 03/14/2018  . Microscopic hematuria 03/14/2018  . Diarrhea 11/20/2017  . Diabetes mellitus without complication ([place]) 44/81/8563  . Iron deficiency anemia 11/19/2017  .  Influenza, pneumonia 09/22/2017  . Abdominal wall cellulitis 01/22/2017  . Insect bite 01/22/2017  . Hyperkalemia 01/22/2017  . OSA (obstructive sleep apnea) 01/22/2017  . Essential hypertension   . Hoarseness 06/08/2016  . Vocal cord paralysis 01/31/2016  . Hypercalciuria 01/10/2016  . Cellulitis 11/28/2015  . Cellulitis of trunk 11/27/2015  . Nonspecific abnormal finding in stool contents   . Colon cancer screening   . Venous stasis   . History of DVT (deep vein thrombosis)   . Sepsis ([place]) 10/09/2015  . Panniculitis 10/09/2015  . CLL (chronic lymphocytic leukemia) ([place]) 05/27/2015  . 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 Calculus of kidney 10/27/2014  . Leukocytosis (leucocytosis) 08/29/2014  . HNP (herniated nucleus pulposus), cervical 01/21/2013  . bilat hip replacement 04/08/2012  . Morbid obesity ([place]) 04/08/2012  . Edema 04/08/2012   Past Medical History:  Diagnosis Date  . Anemia   . Arthritis   . BMI 50.0-59.9, adult ([place])   . Cellulitis 11/2015   trunk  . cll 11/2014   CLL - 11/30/2014- Dr. [facility] [place], [place]  . DVT (deep venous thrombosis) ([place]) 6/16   Rt calf  took xarelto for 6 months  . Family history of anesthesia complication    " father having delirium after anesthesia"  . Heart murmur    Hx; of as a child  . History of kidney stones    x2  . Joint pain    Hx: of periodically  . Numbness and tingling    Hx: of in right arm  .  Pneumonia   . Sleep apnea    Cpap use- settings 10   Past Surgical History:  Procedure Laterality Date  . BACK SURGERY    . COLONOSCOPY     Hx: of  . COLONOSCOPY N/A 11/23/2015   Procedure: COLONOSCOPY;  Surgeon: Irene Shipper, MD;  [facility];  Service: Endoscopy;  Laterality: N/A;  . HERNIA REPAIR N/A    Phreesia 03/19/2020  . INGUINAL HERNIA REPAIR Left 06/01/2015   Procedure: LEFT INGUINAL HERNIA REPAIR WITH MESH;  Surgeon: Autumn Messing III, MD;  [facility];  Service: General;  Laterality:  Left;  . INGUINAL HERNIA REPAIR    . INSERTION OF MESH Left 06/01/2015   Procedure: INSERTION OF MESH;  Surgeon: Autumn Messing III, MD;  [facility];  Service: General;  Laterality: Left;  . JOINT REPLACEMENT Bilateral    BTHA  . KNEE ARTHROSCOPY Left 07/21/2014   Dr. Noemi Chapel  . LUMBAR LAMINECTOMY  2005  . POSTERIOR CERVICAL LAMINECTOMY WITH MET- RX Right 01/21/2013   Procedure: Right Sitting C6-7 Microdiskectomy with Met-rex;  Surgeon: Kristeen Miss, MD;  Location: [facility];  Service: Neurosurgery;  Laterality: Right;  Right Sitting C6-7 Microdiskectomy with Met-rex  . SPINE SURGERY N/A    [employer]reesia 03/19/2020  . STENT PLACEMENT RT URETER ([place] HX)  10/23/2014  . tenosynovitis  2000   Hx: of thumb  . TONSILLECTOMY    . TOTAL HIP ARTHROPLASTY Bilateral   . WISDOM TOOTH EXTRACTION  1974   all 4   Allergies  Allergen Reactions  . Adhesive [Tape] Other (See Comments)    Blisters and rash at site  . Other    Prior to Admission medications   Medication Sig Start Date End Date Taking? Authorizing Provider  Ascorbic Acid (VITAMIN C) 1000 MG tablet Take 1,000 mg by mouth daily.   Yes [provider]  aspirin EC 81 MG tablet Take 81 mg by mouth daily.   Yes [provider]  atorvastatin (LIPITOR) 10 MG tablet TAKE 1 TABLET BY MOUTH EVERY DAY 11/09/19  Yes [name], MD  Dulaglutide (TRULICITY) 1.5 YT/0.1SW SOPN Inject 0.5 mLs (1.5 mg total) into the skin once a week. 12/17/19  Yes [name], MD  ferrous sulfate (IRON SUPPLEMENT) 325 (65 FE) MG tablet Take 325 mg by mouth daily with breakfast.   Yes [provider]  fluticasone (FLONASE) 50 MCG/ACT nasal spray SPRAY 2 SPRAYS INTO EACH NOSTRIL EVERY DAY 10/25/18  Yes [name], MD  furosemide (LASIX) 80 MG tablet TAKE 1 TABLET (80 MG TOTAL) BY MOUTH 2 (TWO) TIMES DAILY. 11/05/19  Yes [name], MD  ibuprofen (ADVIL,MOTRIN) 200 MG tablet Take 600 mg by mouth every 6 (six) hours as needed  for mild pain.   Yes [provider]  KLOR-CON M20 20 MEQ tablet TAKE 1 TABLET BY MOUTH TWICE A DAY 10/07/19  Yes [name], MD  metFORMIN (GLUCOPHAGE) 1000 MG tablet TAKE 1 TABLET BY MOUTH 2 TIMES DAILY WITH A MEAL. 12/17/19  Yes [name], MD  metroNIDAZOLE (METROGEL) 1 % gel Apply topically daily. 12/17/19  Yes [name], MD  Multiple Vitamins-Minerals (MULTIVITAMIN WITH MINERALS) tablet Take 1 tablet by mouth daily.   Yes [provider]  mupirocin ointment (BACTROBAN) 2 % Apply 1 application topically 3 (three) times daily. 12/17/19  Yes [name], MD  Zinc 15 MG CAPS  05/22/19  Yes [provider]   Social  History   Socioeconomic History  . Marital status: Divorced    Spouse name: Not on file  . Number of children: 0  . Years of education: Not on file  . Highest education level: Not on file  Occupational History  . Occupation: Geophysical data processor: [employer]  Tobacco Use  . Smoking status: Never Smoker  . Smokeless tobacco: Former Systems developer    Types: Secondary school teacher  . Vaping Use: Never used  Substance and Sexual Activity  . Alcohol use: Yes    Alcohol/week: 1.0 standard drink    Types: 1 Standard drinks or equivalent per week    Comment: BEER AND LIQUOR  . Drug use: No  . Sexual activity: Not Currently  Other Topics Concern  . Not on file  Social History Narrative   Exercise swimming 3-5 times/week for 45 minutes   Social Determinants of Health   Financial Resource Strain:   . Difficulty of Paying Living Expenses: Not on file  Food Insecurity:   . Worried About Charity fundraiser in the Last Year: Not on file  . Ran Out of Food in the Last Year: Not on file  Transportation Needs:   . Lack of Transportation (Medical): Not on file  . Lack of Transportation (Non-Medical): Not on file  Physical Activity:   . Days of Exercise per Week: Not on file  . Minutes of Exercise per Session: Not  on file  Stress:   . Feeling of Stress : Not on file  Social Connections:   . Frequency of Communication with Friends and Family: Not on file  . Frequency of Social Gatherings with Friends and Family: Not on file  . Attends Religious Services: Not on file  . Active Member of Clubs or Organizations: Not on file  . Attends Archivist Meetings: Not on file  . Marital Status: Not on file  Intimate Partner Violence:   . Fear of Current or Ex-Partner: Not on file  . Emotionally Abused: Not on file  . Physically Abused: Not on file  . Sexually Abused: Not on file    Observations/Objective: Vitals:   03/19/20 1107  Weight: (!) 344 lb (156 kg)  Height: '5\' 9"'  (1.753 m)  No distress, appropriate responses.  Speaking in full sentences.  All questions answered.   Assessment and Plan: Acute non-recurrent sinusitis, unspecified location - Plan: amoxicillin-clavulanate (AUGMENTIN) 875-125 MG tablet  -Likely initial viral syndrome with sick contact with reported rhinovirus, negative Covid testing.  Early symptoms, but differential includes sinusitis with discolored discharge, slight worsening symptoms.  No respiratory distress, no other concerning symptoms.  He does have history of CLL.   -Symptomatic care discussed, Augmentin provided if not improving in the next few days, with ER/RTC precautions given.  Did recommend Covid testing although less likely.  Follow Up Instructions:    I discussed the assessment and treatment plan with the patient. The patient was provided an opportunity to ask questions and all were answered. The patient agreed with the plan and demonstrated an understanding of the instructions.   The patient was advised to call back or seek an in-person evaluation if the symptoms worsen or if the condition fails to improve as anticipated.  I provided 15 minutes of non-face-to-face time during this encounter.  Signed,   [name], MD [facility] [place].  03/19/20

## 2020-03-22 ENCOUNTER — Ambulatory Visit (INDEPENDENT_AMBULATORY_CARE_PROVIDER_SITE_OTHER): Payer: BC Managed Care – PPO | Admitting: Family Medicine

## 2020-03-22 ENCOUNTER — Encounter: Payer: Self-pay | Admitting: Family Medicine

## 2020-03-22 ENCOUNTER — Other Ambulatory Visit: Payer: Self-pay

## 2020-03-22 VITALS — HR 83 | Temp 97.7°F | Ht 69.0 in | Wt 353.0 lb

## 2020-03-22 DIAGNOSIS — Z0000 Encounter for general adult medical examination without abnormal findings: Secondary | ICD-10-CM

## 2020-03-22 DIAGNOSIS — R609 Edema, unspecified: Secondary | ICD-10-CM | POA: Diagnosis not present

## 2020-03-22 DIAGNOSIS — R600 Localized edema: Secondary | ICD-10-CM

## 2020-03-22 DIAGNOSIS — J069 Acute upper respiratory infection, unspecified: Secondary | ICD-10-CM

## 2020-03-22 DIAGNOSIS — E1165 Type 2 diabetes mellitus with hyperglycemia: Secondary | ICD-10-CM | POA: Diagnosis not present

## 2020-03-22 DIAGNOSIS — Z6841 Body Mass Index (BMI) 40.0 and over, adult: Secondary | ICD-10-CM

## 2020-03-22 DIAGNOSIS — Z0001 Encounter for general adult medical examination with abnormal findings: Secondary | ICD-10-CM | POA: Diagnosis not present

## 2020-03-22 MED ORDER — METFORMIN HCL 1000 MG PO TABS: ORAL_TABLET | ORAL | 1 refills | Status: DC

## 2020-03-22 MED ORDER — TRULICITY 1.5 MG/0.5ML ~~LOC~~ SOAJ: 1.5000 mg | SUBCUTANEOUS | 1 refills | Status: DC

## 2020-03-22 MED ORDER — FUROSEMIDE 80 MG PO TABS: 80.0000 mg | ORAL_TABLET | Freq: Two times a day (BID) | ORAL | 1 refills | Status: DC

## 2020-03-22 MED ORDER — POTASSIUM CHLORIDE CRYS ER 20 MEQ PO TBCR: 20.0000 meq | EXTENDED_RELEASE_TABLET | Freq: Two times a day (BID) | ORAL | 1 refills | Status: DC

## 2020-03-22 NOTE — Patient Instructions (Addendum)
°  If diet can be improved, can remain on same trulicity dose and metformin. Recheck A1c in 3 months. No other med changes at this time.   Follow up with hematologist and urologist as planned.  Can start antibiotic for sinus infection.  Return to the clinic or go to the nearest emergency room if any of your symptoms worsen or new symptoms occur.  3 month follow up for diabetes.    If you have lab work done today you will be contacted with your lab results within the next 2 weeks.  If you have not heard from [place] then please contact us. The fastest way to get your results is to register for My Chart.   IF you received an x-ray today, you will receive an invoice from [facility]. Please contact [facility] at [phone] with questions or concerns regarding your invoice.   IF you received labwork today, you will receive an invoice from [facility]. Please contact [facility] at [phone] with questions or concerns regarding your invoice.   Our billing staff will not be able to assist you with questions regarding bills from these companies.  You will be contacted with the lab results as soon as they are available. The fastest way to get your results is to activate your My Chart account. Instructions are located on the last page of this paperwork. If you have not heard from [place] regarding the results in 2 weeks, please contact this office.

## 2020-03-22 NOTE — Progress Notes (Signed)
Subjective:  Patient ID: Henry Navy., male    DOB: 08-Sep-1960  Age: 59 y.o. MRN: [id number]  CC:  Chief Complaint  Patient presents with  . Annual Exam    pt reports he sill having symptoms from his Video appt on 03/19/2020. pt reports his L eye has had some drainage. other than these symptoms he feels goopd.    HPI Henry Hinton. presents for  annual physical exam and follow-up of symptoms as above.   Cough/sinus congestion video visit November 19.  Possible viral illness versus early sinusitis, Augmentin prescribed if not improving with symptomatic care.  Some left eye watering since last visit, no crusting/discoloration, normal vision. otc eye gtts. Fullness in R ear.  Has not started antibiotic.  Feels overall better. Left cheek sore, slight HA.  No fever. Negative covid test on Saturday.  Obesity, Diabetes: Complicated by hyperglycemia, obesity. Treated with Trulicity 1.5 mg/week, metformin 1000 mg twice daily.  He is on statin with Lipitor every other day.  Microalbumin: Normal ratio 09/08/2019 Optho, foot exam, pneumovax: Up-to-date Home readings: 120-130 fasting, some higher readings with worse diet during football season - anticipates improvement.   Wt Readings from Last 3 Encounters:  03/22/20 (!) 353 lb (160.1 kg)  03/19/20 (!) 344 lb (156 kg)  12/17/19 (!) 347 lb 3.2 oz (157.5 kg)  swimming for exercise. Has goal of 30# weight loss by May.  Now [employer]. Over 5 states and [place].more travel planned, flying more.  .Body mass index is 52.13 kg/m.  Lab Results  Component Value Date   HGBA1C 7.5 (H) 03/18/2020   HGBA1C 7.3 (H) 12/17/2019   HGBA1C 7.5 (H) 09/08/2019   Lab Results  Component Value Date   MICROALBUR 0.7 02/24/2016   LDLCALC 99 03/18/2020   CREATININE 0.75 (L) 03/18/2020   chronic lymphocytic leukemia followed by Dr. [name] at [facility].  Last appointment August 17.  Stable  hemoglobin, platelet count at that time, WBC slightly increased since prior visit.  Plan to continue to monitor and look for B symptoms.  Option of IV chemotherapy versus pills like ibrutinib if progression.  Office visit November 30 planned.  On lasix 54m BID for pedal edema, klor con 241m BID.   Cancer screening: Colonoscopy 11/23/2015, repeat 10 years  prostate, family history of prostate cancer in father.  Recent testing stable. Hx of BPH, [facility] Last saw in January this year.  The natural history of prostate cancer and ongoing controversy regarding screening and potential treatment outcomes of prostate cancer has been discussed with the patient. The meaning of a false positive PSA and a false negative PSA has been discussed.DRE planned with urology.   Lab Results  Component Value Date   PSA1 0.4 03/18/2020   PSA1 0.3 03/20/2019   PSA1 0.4 03/04/2018   PSA 0.3 02/24/2016   PSA 0.38 02/15/2015   PSA 0.39 11/25/2013  CLL, followed by oncology as above.  Immunization History  Administered Date(s) Administered  . Influenza,inj,Quad PF,6+ Mos 02/27/2017, 03/04/2018, 03/20/2019  . Janssen (J&J) SARS-COV-2 Vaccination 12/06/2019  . Pneumococcal Polysaccharide-23 03/04/2018  . Tdap 07/31/2006, 02/27/2017  declines flu vaccine at this time - will have after current illness.    [facility] 03/22/2020 03/19/2020 12/17/2019 09/08/2019 07/07/2019  Decreased Interest 0 0 0 0 0  Down, Depressed, Hopeless 0 0 0 0 0  PHQ - 2 Score  0 0 0 0 0    No exam data present Regular optho visits - 03/01/20.   Dental: every 6 months.   Exercise: Swimming 5 days per week. 1.5 hrs per time - 1.5 mile swim.        History Patient Active Problem List   Diagnosis Date Noted  . Acute hypoxemic respiratory failure due to COVID-19 ([place]) 05/18/2019  . BPH without obstruction/lower urinary tract symptoms 03/14/2018  . Family history of  prostate cancer 03/14/2018  . History of nephrolithiasis 03/14/2018  . Microscopic hematuria 03/14/2018  . Diarrhea 11/20/2017  . Diabetes mellitus without complication ([place]) 40/01/2724  . Iron deficiency anemia 11/19/2017  . Influenza, pneumonia 09/22/2017  . Abdominal wall cellulitis 01/22/2017  . Insect bite 01/22/2017  . Hyperkalemia 01/22/2017  . OSA (obstructive sleep apnea) 01/22/2017  . Essential hypertension   . Hoarseness 06/08/2016  . Vocal cord paralysis 01/31/2016  . Hypercalciuria 01/10/2016  . Cellulitis 11/28/2015  . Cellulitis of trunk 11/27/2015  . Nonspecific abnormal finding in stool contents   . Colon cancer screening   . Venous stasis   . History of DVT (deep vein thrombosis)   . Sepsis ([place]) 10/09/2015  . Panniculitis 10/09/2015  . CLL (chronic lymphocytic leukemia) ([place]) 05/27/2015  . 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 Calculus of kidney 10/27/2014  . Leukocytosis (leucocytosis) 08/29/2014  . HNP (herniated nucleus pulposus), cervical 01/21/2013  . bilat hip replacement 04/08/2012  . Morbid obesity ([place]) 04/08/2012  . Edema 04/08/2012   Past Medical History:  Diagnosis Date  . Anemia   . Arthritis   . BMI 50.0-59.9, adult ([place])   . Cellulitis 11/2015   trunk  . cll 11/2014   CLL - 11/30/2014- Dr. [facility] [place], [place]  . DVT (deep venous thrombosis) ([place]) 6/16   Rt calf  took xarelto for 6 months  . Family history of anesthesia complication    " father having delirium after anesthesia"  . Heart murmur    Hx; of as a child  . History of kidney stones    x2  . Joint pain    Hx: of periodically  . Numbness and tingling    Hx: of in right arm  . Pneumonia   . Sleep apnea    Cpap use- settings 10   Past Surgical History:  Procedure Laterality Date  . BACK SURGERY    . COLONOSCOPY     Hx: of  . COLONOSCOPY N/A 11/23/2015   Procedure: COLONOSCOPY;  Surgeon: Irene Shipper, MD;  Location: [facility];  Service: Endoscopy;   Laterality: N/A;  . HERNIA REPAIR N/A    Phreesia 03/19/2020  . INGUINAL HERNIA REPAIR Left 06/01/2015   Procedure: LEFT INGUINAL HERNIA REPAIR WITH MESH;  Surgeon: Autumn Messing III, MD;  Location: [facility] ORS;  Service: General;  Laterality: Left;  . INGUINAL HERNIA REPAIR    . INSERTION OF MESH Left 06/01/2015   Procedure: INSERTION OF MESH;  Surgeon: Autumn Messing III, MD;  Location: [facility]S;  Service: General;  Laterality: Left;  . JOINT REPLACEMENT Bilateral    BTHA  . KNEE ARTHROSCOPY Left 07/21/2014   Dr. Noemi Chapel  . LUMBAR LAMINECTOMY  2005  . POSTERIOR CERVICAL LAMINECTOMY WITH MET- RX Right 01/21/2013   Procedure: Right Sitting C6-7 Microdiskectomy with Met-rex;  Surgeon: Kristeen Miss, MD;  Location: [place] [facility] ORS;  Service: Neurosurgery;  Laterality: Right;  Right Sitting C6-7 Microdiskectomy with Met-rex  . SPINE SURGERY N/A    Phreesia 03/19/2020  .  STENT PLACEMENT RT URETER ([place] [facility]X)  10/23/2014  . tenosynovitis  2000   Hx: of thumb  . TONSILLECTOMY    . TOTAL HIP ARTHROPLASTY Bilateral   . WISDOM TOOTH EXTRACTION  1974   all 4   Allergies  Allergen Reactions  . Adhesive [Tape] Other (See Comments)    Blisters and rash at site  . Other    Prior to Admission medications   Medication Sig Start Date End Date Taking? Authorizing Provider  amoxicillin-clavulanate (AUGMENTIN) 875-125 MG tablet Take 1 tablet by mouth 2 (two) times daily. 03/19/20  Yes [name], MD  Ascorbic Acid (VITAMIN C) 1000 MG tablet Take 1,000 mg by mouth daily.   Yes [provider]  aspirin EC 81 MG tablet Take 81 mg by mouth daily.   Yes [provider]  atorvastatin (LIPITOR) 10 MG tablet TAKE 1 TABLET BY MOUTH EVERY DAY 11/09/19  Yes [name], MD  Dulaglutide (TRULICITY) 1.5 MO/2.9UT SOPN Inject 0.5 mLs (1.5 mg total) into the skin once a week. 12/17/19  Yes [name], MD  ferrous sulfate (IRON SUPPLEMENT) 325 (65 FE) MG tablet Take 325 mg by mouth daily with breakfast.    Yes [provider]  fluticasone (FLONASE) 50 MCG/ACT nasal spray SPRAY 2 SPRAYS INTO EACH NOSTRIL EVERY DAY 10/25/18  Yes [name], MD  furosemide (LASIX) 80 MG tablet TAKE 1 TABLET (80 MG TOTAL) BY MOUTH 2 (TWO) TIMES DAILY. 11/05/19  Yes [name], MD  ibuprofen (ADVIL,MOTRIN) 200 MG tablet Take 600 mg by mouth every 6 (six) hours as needed for mild pain.   Yes [provider]  KLOR-CON M20 20 MEQ tablet TAKE 1 TABLET BY MOUTH TWICE A DAY 10/07/19  Yes [name], MD  metFORMIN (GLUCOPHAGE) 1000 MG tablet TAKE 1 TABLET BY MOUTH 2 TIMES DAILY WITH A MEAL. 12/17/19  Yes [name], MD  metroNIDAZOLE (METROGEL) 1 % gel Apply topically daily. 12/17/19  Yes [name], MD  Multiple Vitamins-Minerals (MULTIVITAMIN WITH MINERALS) tablet Take 1 tablet by mouth daily.   Yes [provider]  mupirocin ointment (BACTROBAN) 2 % Apply 1 application topically 3 (three) times daily. 12/17/19  Yes [name], MD  Zinc 15 MG CAPS  05/22/19  Yes [provider]   Social History   Socioeconomic History  . Marital status: Divorced    Spouse name: Not on file  . Number of children: 0  . Years of education: Not on file  . Highest education level: Not on file  Occupational History  . Occupation: Geophysical data processor: [employer]  Tobacco Use  . Smoking status: Never Smoker  . Smokeless tobacco: Former Systems developer    Types: Secondary school teacher  . Vaping Use: Never used  Substance and Sexual Activity  . Alcohol use: Yes    Alcohol/week: 1.0 standard drink    Types: 1 Standard drinks or equivalent per week    Comment: [employer]  . Drug use: No  . Sexual activity: Not Currently  Other Topics Concern  . Not on file  Social History Narrative   Exercise swimming 3-5 times/week for 45 minutes   Social Determinants of Health   Financial Resource Strain:   . Difficulty of Paying Living Expenses: Not on file    Food Insecurity:   . Worried About Charity fundraiser in the Last Year: Not on file  . Ran  Out of Food in the Last Year: Not on file  Transportation Needs:   . Lack of Transportation (Medical): Not on file  . Lack of Transportation (Non-Medical): Not on file  Physical Activity:   . Days of Exercise per Week: Not on file  . Minutes of Exercise per Session: Not on file  Stress:   . Feeling of Stress : Not on file  Social Connections:   . Frequency of Communication with Friends and Family: Not on file  . Frequency of Social Gatherings with Friends and Family: Not on file  . Attends Religious Services: Not on file  . Active Member of Clubs or Organizations: Not on file  . Attends Archivist Meetings: Not on file  . Marital Status: Not on file  Intimate Partner Violence:   . Fear of Current or Ex-Partner: Not on file  . Emotionally Abused: Not on file  . Physically Abused: Not on file  . Sexually Abused: Not on file    Review of Systems 13 point review of systems per patient health survey noted.  Negative other than as indicated above or in HPI.    Objective:   Vitals:   03/22/20 0952  Pulse: 83  Temp: 97.7 F (36.5 C)  TempSrc: Temporal  SpO2: 94%  Weight: (!) 353 lb (160.1 kg)  Height: _0  (1.753 m)     Physical Exam Vitals reviewed.  Constitutional:      Appearance: He is well-developed.  HENT:     Head: Normocephalic and atraumatic.     Right Ear: Tympanic membrane, ear canal and external ear normal.     Left Ear: Tympanic membrane, ear canal and external ear normal.     Nose: No rhinorrhea.     Mouth/Throat:     Pharynx: No oropharyngeal exudate or posterior oropharyngeal erythema.  Eyes:     Conjunctiva/sclera: Conjunctivae normal.     Pupils: Pupils are equal, round, and reactive to light.  Neck:     Thyroid: No thyromegaly.  Cardiovascular:     Rate and Rhythm: Normal rate and regular rhythm.     Heart sounds: Normal heart sounds. No  murmur heard.   Pulmonary:     Effort: Pulmonary effort is normal. No respiratory distress.     Breath sounds: Normal breath sounds. No wheezing, rhonchi or rales.  Abdominal:     General: There is no distension.     Palpations: Abdomen is soft.     Tenderness: There is no abdominal tenderness.  Musculoskeletal:        General: No tenderness. Normal range of motion.     Cervical back: Normal range of motion and neck supple.     Right lower leg: Edema present.     Left lower leg: Edema (Some stasis changes/hyperpigmentation, minimal pedal edema.) present.  Lymphadenopathy:     Cervical: No cervical adenopathy.  Skin:    General: Skin is warm and dry.     Findings: No rash.  Neurological:     Mental Status: He is alert and oriented to person, place, and time.     Deep Tendon Reflexes: Reflexes are normal and symmetric.  Psychiatric:        Behavior: Behavior normal.   Slight tenderness palpation of the left maxillary sinus.     Assessment & Plan:  Henry Hinton. is a 59 y.o. male . Annual physical exam  Labs reviewed from recent lab only visit.  Anticipatory guidance.  Health maintenance reviewed  as above.  Peripheral edema - Plan: potassium chloride SA (KLOR-CON M20) 20 MEQ tablet, furosemide (LASIX) 80 MG tablet  -Overall stable with furosemide, potassium, continue same.  Electrolytes recently stable.  Type 2 diabetes mellitus with hyperglycemia, without long-term current use of insulin (HCC) - Plan: metFORMIN (GLUCOPHAGE) 1000 MG tablet, Dulaglutide (TRULICITY) 1.5 HY/3.8OI SOPN  -Decreased control but reports dietary indiscretion.  Plans to change diet, declined change in meds at this time.  Recheck 3 months.  Continue Metformin, Trulicity same dose for now.  Acute upper respiratory infection  -Some improvement, now with more left maxillary sinus symptoms.  Starting Augmentin, potential side effects discussed with RTC precautions  BMI 50.0-59.9, adult  (HCC)  -Exercising 5 days/week with swimming as above.  Plan weight loss goal as above.  Option to meet with bariatric specialist if difficulty with weight loss.  Continue follow-up with hematology for CLL, urology for history of prostate cancer and BPH but recent PSA reassuring.    Meds ordered this encounter  Medications  . potassium chloride SA (KLOR-CON M20) 20 MEQ tablet    Sig: Take 1 tablet (20 mEq total) by mouth 2 (two) times daily.    Dispense:  180 tablet    Refill:  1  . metFORMIN (GLUCOPHAGE) 1000 MG tablet    Sig: TAKE 1 TABLET BY MOUTH 2 TIMES DAILY WITH A MEAL.    Dispense:  180 tablet    Refill:  1  . Dulaglutide (TRULICITY) 1.5 LN/7.9JK SOPN    Sig: Inject 1.5 mg into the skin once a week.    Dispense:  6 mL    Refill:  1  . furosemide (LASIX) 80 MG tablet    Sig: Take 1 tablet (80 mg total) by mouth 2 (two) times daily.    Dispense:  180 tablet    Refill:  1   Patient Instructions    If diet can be improved, can remain on same trulicity dose and metformin. Recheck A1c in 3 months. No other med changes at this time.   Follow up with hematologist and urologist as planned.  Can start antibiotic for sinus infection.  Return to the clinic or go to the nearest emergency room if any of your symptoms worsen or new symptoms occur.  3 month follow up for diabetes.    If you have lab work done today you will be contacted with your lab results within the next 2 weeks.  If you have not heard from [place] then please contact us. The fastest way to get your results is to register for My Chart.   IF you received an x-ray today, you will receive an invoice from [facility]. Please contact [facility]iology at [phone] with questions or concerns regarding your invoice.   IF you received labwork today, you will receive an invoice from [facility]. Please contact [facility] at [phone] with questions or concerns regarding your invoice.   Our billing staff will not  be able to assist you with questions regarding bills from these companies.  You will be contacted with the lab results as soon as they are available. The fastest way to get your results is to activate your My Chart account. Instructions are located on the last page of this paperwork. If you have not heard from [place] regarding the results in 2 weeks, please contact this office.         Signed, [name], MD [facility]

## 2020-03-23 ENCOUNTER — Encounter: Payer: Self-pay | Admitting: Family Medicine

## 2020-03-23 NOTE — Telephone Encounter (Signed)
Called pt to talk to ima bout symptoms, no answer. Pls follow up

## 2020-03-24 ENCOUNTER — Telehealth: Payer: Self-pay

## 2020-03-24 NOTE — Telephone Encounter (Signed)
6:07 PM Called patient. Started antibiotic yesterday, did have some blood-tinged mucus, blood tinged nasal discharge, no fevers, shortness of breath or other clinical worsening. No further blood-tinged mucus today, only streaks of blood in the nasal discharge, but overall feeling better on antibiotics. No fever/shortness of breath today.  Plans to continue antibiotic, with urgent care /ER precautions in the next few days if not improving, or any return of blood tinged mucus, shortness of breath or fever/worsening sx's occur.

## 2020-03-24 NOTE — Telephone Encounter (Signed)
I attempted to call the pt regarding symptoms the the pt stated having in a mychart message. I attempted to call both the pt's cell and home number. I also left a voicemail on each asking the pt to call back.

## 2020-03-24 NOTE — Telephone Encounter (Signed)
Called pt a second time and got an update of current symptoms. Pt informed us that he started the antibiotic that was prescribed. I transferred the call to doctor greene and he spoke with the pt about this matter directly.

## 2020-04-21 ENCOUNTER — Encounter: Payer: Self-pay | Admitting: Family Medicine

## 2020-04-21 DIAGNOSIS — E1165 Type 2 diabetes mellitus with hyperglycemia: Secondary | ICD-10-CM

## 2020-04-21 MED ORDER — TRULICITY 1.5 MG/0.5ML ~~LOC~~ SOAJ: 1.5000 mg | SUBCUTANEOUS | 1 refills | Status: DC

## 2020-05-24 ENCOUNTER — Encounter: Payer: Self-pay | Admitting: Family Medicine

## 2020-05-24 DIAGNOSIS — E1165 Type 2 diabetes mellitus with hyperglycemia: Secondary | ICD-10-CM

## 2020-05-24 MED ORDER — TRULICITY 1.5 MG/0.5ML ~~LOC~~ SOAJ: 1.5000 mg | SUBCUTANEOUS | 1 refills | Status: DC

## 2020-06-23 ENCOUNTER — Other Ambulatory Visit: Payer: Self-pay

## 2020-06-23 ENCOUNTER — Ambulatory Visit: Payer: BC Managed Care – PPO | Admitting: Family Medicine

## 2020-06-23 ENCOUNTER — Encounter: Payer: Self-pay | Admitting: Family Medicine

## 2020-06-23 VITALS — BP 113/74 | HR 66 | Temp 98.1°F | Ht 69.0 in | Wt 356.0 lb

## 2020-06-23 DIAGNOSIS — E785 Hyperlipidemia, unspecified: Secondary | ICD-10-CM

## 2020-06-23 DIAGNOSIS — E1165 Type 2 diabetes mellitus with hyperglycemia: Secondary | ICD-10-CM | POA: Diagnosis not present

## 2020-06-23 LAB — HEMOGLOBIN A1C
Est. average glucose Bld gHb Est-mCnc: 186 mg/dL
Hgb A1c MFr Bld: 8.1 % — ABNORMAL HIGH (ref 4.8–5.6)

## 2020-06-23 MED ORDER — METFORMIN HCL 1000 MG PO TABS: ORAL_TABLET | ORAL | 1 refills | Status: DC

## 2020-06-23 MED ORDER — ATORVASTATIN CALCIUM 10 MG PO TABS: 10.0000 mg | ORAL_TABLET | Freq: Every day | ORAL | 3 refills | Status: DC

## 2020-06-23 NOTE — Progress Notes (Signed)
Subjective:  Patient ID: Henry Navy., male    DOB: 03-17-1961  Age: 60 y.o. MRN: [id number]  CC:  Chief Complaint  Patient presents with  . Follow-up    On diabetes Pt reports his BS has been high a few times since last OV due to being sick in between visits. Pt reports issues with kidney stones that the urology is treating. PT also reports a concern with a lymphoid on his his neck that he has had a CT scan on it already these are more FYI's to the provider than complaints per pt. Other providers are following these concerns. Office notes are located in care everywhere tab. Under [facility] recent labs are there to.    HPI Henry Ray Dontravious Camille. presents for   Diabetes: Complicated by hyperglycemia, obesity. Trulicity 1.5 mg/week, metformin 1000 mg twice daily. Elevated A1c in November but planned dietary changes, continued  same regimen. Same diet overall, still swimming daily for exercise. [phone] kcal expenditure 4-5 d/week.  Microalbumin: Normal ratio 09/08/2019 Optho, foot exam, pneumovax: Up-to-date Home readings: Elevated at times since last visit due to illness. 2hr PP -116-138 range. Better after exercise.  Rare 200 when sick only.  No symptomatic lows.  Home weights up past few weeks by 3#. Clothing feels loose- feels like toning. On statin.  On cpap. No recent sleep study - looking at possible travel  Wt Readings from Last 3 Encounters:  06/23/20 (!) 356 lb (161.5 kg)  03/22/20 (!) 353 lb (160.1 kg)  03/19/20 (!) 344 lb (156 kg)   History of chronic lymphocytic leukemia Followed by Dr. Rhea Pink at [facility].  Last visit February 1, blood counts relatively stable off treatment.  New lymph node noted in the back of his neck.  CT January 10 with numerous small lymph nodes throughout the neck bilaterally, progressed since 2017 likely related to CLL but no mass lesion.  Plan to repeat scans in 6 months.   Lab Results  Component Value Date   HGBA1C 7.5  (H) 03/18/2020   HGBA1C 7.3 (H) 12/17/2019   HGBA1C 7.5 (H) 09/08/2019   Lab Results  Component Value Date   MICROALBUR 0.7 02/24/2016   LDLCALC 99 03/18/2020   CREATININE 0.75 (L) 03/18/2020   Followed by Dr. Venia Minks with nephrology with nephrolithiasis - R lower [place].  last visit February 14. Planned lithotripsy March 14th.  On lasix 38m BID, potassium 234m BID, no new leg swelling.    History Patient Active Problem List   Diagnosis Date Noted  . Acute hypoxemic respiratory failure due to COVID-19 (HCRed [place]01/17/2021  . BPH without obstruction/lower urinary tract symptoms 03/14/2018  . Family history of prostate cancer 03/14/2018  . History of nephrolithiasis 03/14/2018  . Microscopic hematuria 03/14/2018  . Diarrhea 11/20/2017  . Diabetes mellitus without complication ([id number]. Iron deficiency anemia 11/19/2017  . Influenza, pneumonia 09/22/2017  . Abdominal wall cellulitis 01/22/2017  . Insect bite 01/22/2017  . Hyperkalemia 01/22/2017  . OSA (obstructive sleep apnea) 01/22/2017  . Essential hypertension   . Hoarseness 06/08/2016  . Vocal cord paralysis 01/31/2016  . Hypercalciuria 01/10/2016  . Cellulitis 11/28/2015  . Cellulitis of trunk 11/27/2015  . Nonspecific abnormal finding in stool contents   . Colon cancer screening   . Venous stasis   . History of DVT (deep vein thrombosis)   . Sepsis (HCStarke06/01/2016  . Panniculitis 10/09/2015  . CLL (chronic lymphocytic leukemia) (HCDover01/26/2017  . Calculi, ureter  10/27/2014  . Calculus of kidney 10/27/2014  . Leukocytosis (leucocytosis) 08/29/2014  . HNP (herniated nucleus pulposus), cervical 01/21/2013  . bilat hip replacement 04/08/2012  . Morbid obesity ([place]) 04/08/2012  . Edema 04/08/2012   Past Medical History:  Diagnosis Date  . Anemia   . Arthritis   . BMI 50.0-59.9, adult ([place])   . Cellulitis 11/2015   trunk  . cll 11/2014   CLL - 11/30/2014- Dr. Ronney [facility] [place], [place]  . DVT  (deep venous thrombosis) ([place]) 6/16   Rt calf  took xarelto for 6 months  . Family history of anesthesia complication    " father having delirium after anesthesia"  . Heart murmur    Hx; of as a child  . History of kidney stones    x2  . Joint pain    Hx: of periodically  . Numbness and tingling    Hx: of in right arm  . Pneumonia   . Sleep apnea    Cpap use- settings 10   Past Surgical History:  Procedure Laterality Date  . BACK SURGERY    . COLONOSCOPY     Hx: of  . COLONOSCOPY N/A 11/23/2015   Procedure: COLONOSCOPY;  Surgeon: Irene Shipper, MD;  [facility];  Service: Endoscopy;  Laterality: N/A;  . HERNIA REPAIR N/A    Phreesia 03/19/2020  . INGUINAL HERNIA REPAIR Left 06/01/2015   Procedure: LEFT INGUINAL HERNIA REPAIR WITH MESH;  Surgeon: Autumn Messing III, MD;  Location: [facility];  Service: General;  Laterality: Left;  . INGUINAL HERNIA REPAIR    . INSERTION OF MESH Left 06/01/2015   Procedure: INSERTION OF MESH;  Surgeon: Autumn Messing III, MD;  Location: [facility];  Service: General;  Laterality: Left;  . JOINT REPLACEMENT Bilateral    BTHA  . KNEE ARTHROSCOPY Left 07/21/2014   Dr. Noemi Chapel  . LUMBAR LAMINECTOMY  2005  . POSTERIOR CERVICAL LAMINECTOMY WITH MET- RX Right 01/21/2013   Procedure: Right Sitting C6-7 Microdiskectomy with Met-rex;  Surgeon: Kristeen Miss, MD;  Location: [facility];  Service: Neurosurgery;  Laterality: Right;  Right Sitting C6-7 Microdiskectomy with Met-rex  . SPINE SURGERY N/A    Phreesia 03/19/2020  . STENT PLACEMENT RT URETER ([place] HX)  10/23/2014  . tenosynovitis  2000   Hx: of thumb  . TONSILLECTOMY    . TOTAL HIP ARTHROPLASTY Bilateral   . WISDOM TOOTH EXTRACTION  1974   all 4   Allergies  Allergen Reactions  . Adhesive [Tape] Other (See Comments)    Blisters and rash at site  . Other    Prior to Admission medications   Medication Sig Start Date End Date Taking? Authorizing Provider  Ascorbic Acid (VITAMIN C) 1000 MG tablet Take  1,000 mg by mouth daily.   Yes [provider]  aspirin EC 81 MG tablet Take 81 mg by mouth daily.   Yes [provider]  atorvastatin (LIPITOR) 10 MG tablet TAKE 1 TABLET BY MOUTH EVERY DAY 11/09/19  Yes [name], MD  Dulaglutide (TRULICITY) 1.5 DQ/2.2WL SOPN Inject 1.5 mg into the skin once a week. 05/24/20  Yes [name], MD  ferrous sulfate 325 (65 FE) MG tablet Take 325 mg by mouth daily with breakfast.   Yes [provider]  fluticasone (FLONASE) 50 MCG/ACT nasal spray SPRAY 2 SPRAYS INTO EACH NOSTRIL EVERY DAY 10/25/18  Yes [name], MD  furosemide (LASIX) 80 MG tablet Take 1 tablet (80  mg total) by mouth 2 (two) times daily. 03/22/20  Yes [name], MD  ibuprofen (ADVIL,MOTRIN) 200 MG tablet Take 600 mg by mouth every 6 (six) hours as needed for mild pain.   Yes [provider]  metFORMIN (GLUCOPHAGE) 1000 MG tablet TAKE 1 TABLET BY MOUTH 2 TIMES DAILY WITH A MEAL. 03/22/20  Yes [name], MD  metroNIDAZOLE (METROGEL) 1 % gel Apply topically daily. 12/17/19  Yes [name], MD  Multiple Vitamins-Minerals (MULTIVITAMIN WITH MINERALS) tablet Take 1 tablet by mouth daily.   Yes [provider]  mupirocin ointment (BACTROBAN) 2 % Apply 1 application topically 3 (three) times daily. 12/17/19  Yes [name], MD  potassium chloride SA (KLOR-CON M20) 20 MEQ tablet Take 1 tablet (20 mEq total) by mouth 2 (two) times daily. 03/22/20  Yes [name], MD  Zinc 15 MG CAPS  05/22/19  Yes [provider]  amoxicillin-clavulanate (AUGMENTIN) 875-125 MG tablet Take 1 tablet by mouth 2 (two) times daily. Patient not taking: Reported on 06/23/2020 03/19/20   [name], MD   Social History   Socioeconomic History  . Marital status: Divorced    Spouse name: Not on file  . Number of children: 0  . Years of education: Not on file  . Highest education level: Not on file  Occupational  History  . Occupation: Geophysical data processor: [employer]  Tobacco Use  . Smoking status: Never Smoker  . Smokeless tobacco: Former Systems developer    Types: Secondary school teacher  . Vaping Use: Never used  Substance and Sexual Activity  . Alcohol use: Yes    Alcohol/week: 1.0 standard drink    Types: 1 Standard drinks or equivalent per week    Comment: BEER AND LIQUOR  . Drug use: No  . Sexual activity: Not Currently  Other Topics Concern  . Not on file  Social History Narrative   Exercise swimming 3-5 times/week for 45 minutes   Social Determinants of Health   Financial Resource Strain: Not on file  Food Insecurity: Not on file  Transportation Needs: Not on file  Physical Activity: Not on file  Stress: Not on file  Social Connections: Not on file  Intimate Partner Violence: Not on file    Review of Systems  Constitutional: Negative for fatigue and unexpected weight change.  Eyes: Negative for visual disturbance.  Respiratory: Negative for cough, chest tightness and shortness of breath.   Cardiovascular: Negative for chest pain, palpitations and leg swelling.  Gastrointestinal: Negative for abdominal pain and blood in stool.  Neurological: Negative for dizziness, light-headedness and headaches.    Objective:   Vitals:   06/23/20 1106  BP: 113/74  Pulse: 66  Temp: 98.1 F (36.7 C)  TempSrc: Temporal  Weight: (!) 356 lb (161.5 kg)  Height: _0  (1.753 m)     Physical Exam Vitals reviewed.  Constitutional:      Appearance: He is well-developed and well-nourished. He is obese.  HENT:     Head: Normocephalic and atraumatic.  Eyes:     Extraocular Movements: EOM normal.     Pupils: Pupils are equal, round, and reactive to light.  Neck:     Vascular: No carotid bruit or JVD.  Cardiovascular:     Rate and Rhythm: Normal rate and regular rhythm.     Heart sounds: Normal heart sounds. No murmur heard.   Pulmonary:     Effort: Pulmonary effort  is normal.     Breath sounds: Normal breath sounds. No rales.  Musculoskeletal:     Right lower leg: Edema (trace only with stasis changes. ) present.     Left lower leg: No edema.  Skin:    General: Skin is warm and dry.  Neurological:     Mental Status: He is alert and oriented to person, place, and time.  Psychiatric:        Mood and Affect: Mood and affect and mood normal.        Behavior: Behavior normal.        Assessment & Plan:  Henry Hinton. is a 60 y.o. male . Type 2 diabetes mellitus with hyperglycemia, without long-term current use of insulin (HCC) - Plan: Hemoglobin A1c, metFORMIN (GLUCOPHAGE) 1000 MG tablet  -Check A1c.  Option of higher dose of GLP-1, but will continue same dosing for now.  Commended on exercise.  Hyperlipidemia, unspecified hyperlipidemia type - Plan: atorvastatin (LIPITOR) 10 MG tablet  -Tolerating current dose of Lipitor.  Continue same.  Reviewed results and plan as above regarding his nephrolithiasis and chronic lymphocytic leukemia.  Continue follow-up with specialist.  Meds ordered this encounter  Medications  . atorvastatin (LIPITOR) 10 MG tablet    Sig: Take 1 tablet (10 mg total) by mouth daily.    Dispense:  90 tablet    Refill:  3  . metFORMIN (GLUCOPHAGE) 1000 MG tablet    Sig: TAKE 1 TABLET BY MOUTH 2 TIMES DAILY WITH A MEAL.    Dispense:  180 tablet    Refill:  1   Patient Instructions    No change in meds for now. Depending on A1c, option of higher dose Trulicity. Recheck 6 months, but let me know if questions in the meantime.    If you have lab work done today you will be contacted with your lab results within the next 2 weeks.  If you have not heard from [place] then please contact us. The fastest way to get your results is to register for My Chart.   IF you received an x-ray today, you will receive an invoice from [facility]. Please contact [facility] at [phone] with questions or concerns  regarding your invoice.   IF you received labwork today, you will receive an invoice from [place]. Please contact [facility] at [phone] with questions or concerns regarding your invoice.   Our billing staff will not be able to assist you with questions regarding bills from these companies.  You will be contacted with the lab results as soon as they are available. The fastest way to get your results is to activate your My Chart account. Instructions are located on the last page of this paperwork. If you have not heard from [place] regarding the results in 2 weeks, please contact this office.         Signed, [name], MD [facility]

## 2020-06-23 NOTE — Patient Instructions (Addendum)
  No change in meds for now. Depending on A1c, option of higher dose Trulicity. Recheck 6 months, but let me know if questions in the meantime.    If you have lab work done today you will be contacted with your lab results within the next 2 weeks.  If you have not heard from [place] then please contact us. The fastest way to get your results is to register for My Chart.   IF you received an x-ray today, you will receive an invoice from [facility]. Please contact [facility] at [phone] with questions or concerns regarding your invoice.   IF you received labwork today, you will receive an invoice from [place]. Please contact [facility] at [phone] with questions or concerns regarding your invoice.   Our billing staff will not be able to assist you with questions regarding bills from these companies.  You will be contacted with the lab results as soon as they are available. The fastest way to get your results is to activate your My Chart account. Instructions are located on the last page of this paperwork. If you have not heard from [place] regarding the results in 2 weeks, please contact this office.

## 2020-08-18 ENCOUNTER — Encounter: Payer: Self-pay | Admitting: Family Medicine

## 2020-08-19 ENCOUNTER — Other Ambulatory Visit: Payer: Self-pay

## 2020-08-19 DIAGNOSIS — E1165 Type 2 diabetes mellitus with hyperglycemia: Secondary | ICD-10-CM

## 2020-08-19 MED ORDER — TRULICITY 1.5 MG/0.5ML ~~LOC~~ SOAJ: 1.5000 mg | SUBCUTANEOUS | 1 refills | Status: DC

## 2020-08-20 ENCOUNTER — Encounter: Payer: Self-pay | Admitting: Family Medicine

## 2020-09-13 ENCOUNTER — Telehealth (INDEPENDENT_AMBULATORY_CARE_PROVIDER_SITE_OTHER): Payer: BC Managed Care – PPO | Admitting: Family Medicine

## 2020-09-13 ENCOUNTER — Encounter: Payer: Self-pay | Admitting: Family Medicine

## 2020-09-13 DIAGNOSIS — U071 COVID-19: Secondary | ICD-10-CM

## 2020-09-13 MED ORDER — NIRMATRELVIR/RITONAVIR (PAXLOVID)TABLET: 3.0000 | ORAL_TABLET | Freq: Two times a day (BID) | ORAL | 0 refills | Status: AC

## 2020-09-13 NOTE — Telephone Encounter (Signed)
Please schedule as virtual with me at 4:20 today.

## 2020-09-13 NOTE — Progress Notes (Signed)
Virtual Visit via Telephone Note  I connected with Henry Hinton. on 09/13/20 at 5:31 PM by telephone and verified that I am speaking with the correct person using two identifiers. Patient location: home My location: office - [place].    I discussed the limitations, risks, security and privacy concerns of performing an evaluation and management service by telephone and the availability of in person appointments. I also discussed with the patient that there may be a patient responsible charge related to this service. The patient expressed understanding and agreed to proceed, consent obtained  Chief complaint: Chief Complaint  Patient presents with  . Covid Positive    Pt had positive home test, started feeling feverish last night, 100 fever over night, sinus congestion, sore throat, denies cough taking Day time sinus relief medication this morning when he went to have 2nd COVID swab this morning,      History of Present Illness:  COVID-19 infection: Current symptoms started early this morning feeling feverish, chills.  temperature 100.0 overnight. Treated with tylenol. This morning with increased sinus congestion, sore throat, min cough. Sneezing, blowing nose. Body aches. Rapid test positive for COVID-19 this am. Also had drive through test through [facility] for PCR.  At conference past weekend - one person had been possibly exposed to covid.   Drinking fluids ok.  Denies dyspnea, confusion, disorientation.   Tx: tylenol daytime sinus.   COVID vaccination: He received the [employer] vaccine on 12/06/2019. No other vaccine or booster.   He was admitted January 17 through May 21, 2019 with COVID-19 associated with acute hypoxemic respiratory failure.  Possible covid infection end of December 2021. Negative tests, but before he had symptoms. No Rx treatment at that time.   COVID severe disease risk factors include diabetes, morbid obesity, chronic lymphocytic  leukemia, hypertension.    Patient Active Problem List   Diagnosis Date Noted  . Acute hypoxemic respiratory failure due to COVID-19 ([place]) 05/18/2019  . BPH without obstruction/lower urinary tract symptoms 03/14/2018  . Family history of prostate cancer 03/14/2018  . History of nephrolithiasis 03/14/2018  . Microscopic hematuria 03/14/2018  . Diarrhea 11/20/2017  . Diabetes mellitus without complication ([place]) 42/59/5638  . Iron deficiency anemia 11/19/2017  . Influenza, pneumonia 09/22/2017  . Abdominal wall cellulitis 01/22/2017  . Insect bite 01/22/2017  . Hyperkalemia 01/22/2017  . OSA (obstructive sleep apnea) 01/22/2017  . Essential hypertension   . Hoarseness 06/08/2016  . Vocal cord paralysis 01/31/2016  . Hypercalciuria 01/10/2016  . Cellulitis 11/28/2015  . Cellulitis of trunk 11/27/2015  . Nonspecific abnormal finding in stool contents   . Colon cancer screening   . Venous stasis   . History of DVT (deep vein thrombosis)   . Sepsis ([place]) 10/09/2015  . Panniculitis 10/09/2015  . CLL (chronic lymphocytic leukemia) ([place]) 05/27/2015  . 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 Calculus of kidney 10/27/2014  . Leukocytosis (leucocytosis) 08/29/2014  . HNP (herniated nucleus pulposus), cervical 01/21/2013  . bilat hip replacement 04/08/2012  . Morbid obesity ([place]) 04/08/2012  . Edema 04/08/2012   Past Medical History:  Diagnosis Date  . Anemia   . Arthritis   . BMI 50.0-59.9, adult ([place])   . Cellulitis 11/2015   trunk  . cll 11/2014   CLL - 11/30/2014- Dr. [facility]ney [facility] [place], [place]  . DVT (deep venous thrombosis) ([place]) 6/16   Rt calf  took xarelto for 6 months  . Family history of anesthesia complication    "  father having delirium after anesthesia"  . Heart murmur    Hx; of as a child  . History of kidney stones    x2  . Joint pain    Hx: of periodically  . Numbness and tingling    Hx: of in right arm  . Pneumonia   . Sleep apnea    Cpap use- settings  10   Past Surgical History:  Procedure Laterality Date  . BACK SURGERY    . COLONOSCOPY     Hx: of  . COLONOSCOPY N/A 11/23/2015   Procedure: COLONOSCOPY;  Surgeon: Irene Shipper, MD;  [facility];  Service: Endoscopy;  Laterality: N/A;  . HERNIA REPAIR N/A    Phreesia 03/19/2020  . INGUINAL HERNIA REPAIR Left 06/01/2015   Procedure: LEFT INGUINAL HERNIA REPAIR WITH MESH;  Surgeon: Autumn Messing III, MD;  [facility];  Service: General;  Laterality: Left;  . INGUINAL HERNIA REPAIR    . INSERTION OF MESH Left 06/01/2015   Procedure: INSERTION OF MESH;  Surgeon: Autumn Messing III, MD;  [facility];  Service: General;  Laterality: Left;  . JOINT REPLACEMENT Bilateral    BTHA  . KNEE ARTHROSCOPY Left 07/21/2014   Dr. Noemi Chapel  . LUMBAR LAMINECTOMY  2005  . POSTERIOR CERVICAL LAMINECTOMY WITH MET- RX Right 01/21/2013   Procedure: Right Sitting C6-7 Microdiskectomy with Met-rex;  Surgeon: Kristeen Miss, MD;  Location: [place] NEUR[facility] ORS;  Service: Neurosurgery;  Laterality: Right;  Right Sitting C6-7 Microdiskectomy with Met-rex  . SPINE SURGERY N/A    Phreesia 03/19/2020  . STENT PLACEMENT RT URETER ([place] HX)  10/23/2014  . tenosynovitis  2000   Hx: of thumb  . TONSILLECTOMY    . TOTAL HIP ARTHROPLASTY Bilateral   . WISDOM TOOTH EXTRACTION  1974   all 4   Allergies  Allergen Reactions  . Adhesive [Tape] Other (See Comments)    Blisters and rash at site  . Other    Prior to Admission medications   Medication Sig Start Date End Date Taking? Authorizing Provider  Ascorbic Acid (VITAMIN C) 1000 MG tablet Take 1,000 mg by mouth daily.   Yes [provider]  aspirin EC 81 MG tablet Take 81 mg by mouth daily.   Yes [provider]  atorvastatin (LIPITOR) 10 MG tablet Take 1 tablet (10 mg total) by mouth daily. 06/23/20  Yes [name], MD  Dulaglutide (TRULICITY) 1.5 JQ/7.3AL SOPN Inject 1.5 mg into the skin once a week. 08/19/20  Yes [name], MD   ferrous sulfate 325 (65 FE) MG tablet Take 325 mg by mouth daily with breakfast.   Yes [provider]  fluticasone (FLONASE) 50 MCG/ACT nasal spray SPRAY 2 SPRAYS INTO EACH NOSTRIL EVERY DAY 10/25/18  Yes [name], MD  furosemide (LASIX) 80 MG tablet Take 1 tablet (80 mg total) by mouth 2 (two) times daily. 03/22/20  Yes [name], MD  ibuprofen (ADVIL,MOTRIN) 200 MG tablet Take 600 mg by mouth every 6 (six) hours as needed for mild pain.   Yes [provider]  metFORMIN (GLUCOPHAGE) 1000 MG tablet TAKE 1 TABLET BY MOUTH 2 TIMES DAILY WITH A MEAL. 06/23/20  Yes [name], MD  metroNIDAZOLE (METROGEL) 1 % gel Apply topically daily. 12/17/19  Yes [name], MD  Multiple Vitamins-Minerals (MULTIVITAMIN WITH MINERALS) tablet Take 1 tablet by mouth daily.   Yes [provider]  mupirocin ointment (BACTROBAN) 2 % Apply 1  application topically 3 (three) times daily. 12/17/19  Yes [name], MD  potassium chloride SA (KLOR-CON M20) 20 MEQ tablet Take 1 tablet (20 mEq total) by mouth 2 (two) times daily. 03/22/20  Yes [name], MD  Zinc 15 MG CAPS  05/22/19  Yes [provider]   Social History   Socioeconomic History  . Marital status: Divorced    Spouse name: Not on file  . Number of children: 0  . Years of education: Not on file  . Highest education level: Not on file  Occupational History  . Occupation: Geophysical data processor: [employer]  Tobacco Use  . Smoking status: Never Smoker  . Smokeless tobacco: Former Systems developer    Types: Secondary school teacher  . Vaping Use: Never used  Substance and Sexual Activity  . Alcohol use: Yes    Alcohol/week: 1.0 standard drink    Types: 1 Standard drinks or equivalent per week    Comment: BEER AND LIQUOR  . Drug use: No  . Sexual activity: Not Currently  Other Topics Concern  . Not on file  Social History Narrative   Exercise swimming 3-5 times/week  for 45 minutes   Social Determinants of Health   Financial Resource Strain: Not on file  Food Insecurity: Not on file  Transportation Needs: Not on file  Physical Activity: Not on file  Stress: Not on file  Social Connections: Not on file  Intimate Partner Violence: Not on file     Observations/Objective: O2sat 94%, temp 99.9.  Nontoxic appearance, speaking in full sentences, no respiratory distress.  Euthymic mood.  Coherent responses, all questions were answered with understanding of plan expressed.   Assessment and Plan: COVID-19 virus infection - Plan: nirmatrelvir/ritonavir EUA (PAXLOVID) TABS Initial symptoms today.  Multiple comorbidities as above.  Did have disease in January of last year, single COVID vaccine with [employer].  No booster.  Treatment options discussed including potential risks and benefits of various treatments.  Ultimately decided to treat with Paxlovid.  on review of contraindications, does not appear to be on any medications other than statin which I advised him to stop while taking this medication.  Renal function greater than 60 on most recent labs.  Full dose prescription given.  Symptomatic care discussed with Mucinex, Tylenol, saline nasal spray if needed for congestion, fluids and rest.  Monitor home O2 sats, ER/urgent care precautions given.  Update on symptoms later this week.  Timing of monoclonal antibody also discussed if we decide that it is necessary.  Follow Up Instructions: ER/RTC precautions discussed   I discussed the assessment and treatment plan with the patient. The patient was provided an opportunity to ask questions and all were answered. The patient agreed with the plan and demonstrated an understanding of the instructions.   The patient was advised to call back or seek an in-person evaluation if the symptoms worsen or if the condition fails to improve as anticipated.  I provided 28 minutes of non-face-to-face time during this  encounter.  Signed,   [name], MD [facility] [place].  09/13/20

## 2020-09-18 ENCOUNTER — Encounter: Payer: Self-pay | Admitting: Family Medicine

## 2020-09-19 ENCOUNTER — Encounter: Payer: Self-pay | Admitting: Family Medicine

## 2020-09-20 ENCOUNTER — Encounter: Payer: Self-pay | Admitting: Family Medicine

## 2020-09-26 ENCOUNTER — Other Ambulatory Visit: Payer: Self-pay | Admitting: Family Medicine

## 2020-09-26 DIAGNOSIS — R609 Edema, unspecified: Secondary | ICD-10-CM

## 2020-10-04 ENCOUNTER — Other Ambulatory Visit: Payer: Self-pay

## 2020-10-04 ENCOUNTER — Ambulatory Visit (INDEPENDENT_AMBULATORY_CARE_PROVIDER_SITE_OTHER): Payer: BC Managed Care – PPO | Admitting: Family Medicine

## 2020-10-04 ENCOUNTER — Encounter: Payer: Self-pay | Admitting: Family Medicine

## 2020-10-04 DIAGNOSIS — E785 Hyperlipidemia, unspecified: Secondary | ICD-10-CM | POA: Diagnosis not present

## 2020-10-04 DIAGNOSIS — E1165 Type 2 diabetes mellitus with hyperglycemia: Secondary | ICD-10-CM

## 2020-10-04 LAB — HEMOGLOBIN A1C: Hgb A1c MFr Bld: 8.1 % — ABNORMAL HIGH (ref 4.6–6.5)

## 2020-10-04 LAB — MICROALBUMIN / CREATININE URINE RATIO
Creatinine,U: 32.6 mg/dL
Microalb Creat Ratio: 2.2 mg/g (ref 0.0–30.0)
Microalb, Ur: <0.7 mg/dL (ref 0.0–1.9)

## 2020-10-04 MED ORDER — ATORVASTATIN CALCIUM 10 MG PO TABS: 10.0000 mg | ORAL_TABLET | Freq: Every day | ORAL | 3 refills | Status: DC

## 2020-10-04 MED ORDER — TRULICITY 3 MG/0.5ML ~~LOC~~ SOAJ: 3.0000 mg | SUBCUTANEOUS | 2 refills | Status: DC

## 2020-10-04 MED ORDER — METFORMIN HCL 1000 MG PO TABS: ORAL_TABLET | ORAL | 1 refills | Status: DC

## 2020-10-04 NOTE — Patient Instructions (Addendum)
I will increase trulicity to 3mg  dose as I expect higher dose will be needed. No other med changes at this time.  Fiber like citrucel or metamucil may be helpful if taken daily, make sure to drink plenty of water, and miralax if needed for constipation.   Recheck levels in 3 months, but let me know if there are questions sooner.

## 2020-10-04 NOTE — Progress Notes (Signed)
Subjective:  Patient ID: Henry Hinton., male    DOB: 03-13-1961  Age: 60 y.o. MRN: [id number]  CC:  Chief Complaint  Patient presents with  . Diabetes    Pt reports doing okay, no concerns, due for f/u on DM and lipid, in need of refills     HPI [name]. presents for   COVID-19 infection Treated May 16 by video visit.  Paxlovid prescribed. Improved with this med, no recurrent symptoms.   Diabetes: Complicated by hyperglycemia, obesity.  Treated with 1.5 mg/week, metformin 1000 mg twice daily. Takes Lasix 80 mg twice daily, potassium 20 mEq twice daily, followed by nephrology. Also followed by [facility] for chronic lymphocytic leukemia.  47-monthfollow-up scans planned. Swimming for exercise daily.  He is on statin - Lipitor 125mQOD.  No new side effects of meds.  Home readings fasting: 124-160 Home readings postprandial: highest 220 when sick. Usually mid 100's.  No symptomatic lows.  Weights into 340 range at home.  BM every 3 days.   Wt Readings from Last 3 Encounters:  10/04/20 (!) 354 lb (160.6 kg)  06/23/20 (!) 356 lb (161.5 kg)  03/22/20 (!) 353 lb (160.1 kg)   Microalbumin: Normal ratio 09/08/2019 Optho, foot exam, pneumovax: up to date.   Lab Results  Component Value Date   HGBA1C 8.1 (H) 06/23/2020   HGBA1C 7.5 (H) 03/18/2020   HGBA1C 7.3 (H) 12/17/2019   Lab Results  Component Value Date   MICROALBUR 0.7 02/24/2016   LDLCALC 99 03/18/2020   CREATININE 0.75 (L) 03/18/2020    History Patient Active Problem List   Diagnosis Date Noted  . Acute hypoxemic respiratory failure due to COVID-19 (HCR[place]land01/17/2021  . BPH without obstruction/lower urinary tract symptoms 03/14/2018  . Family history of prostate cancer 03/14/2018  . History of nephrolithiasis 03/14/2018  . Microscopic hematuria 03/14/2018  . Diarrhea 11/20/2017  . Diabetes mellitus without complication (HCAtlanta0787/86/7672. Iron deficiency anemia 11/19/2017  .  Influenza, pneumonia 09/22/2017  . Abdominal wall cellulitis 01/22/2017  . Insect bite 01/22/2017  . Hyperkalemia 01/22/2017  . OSA (obstructive sleep apnea) 01/22/2017  . Essential hypertension   . Hoarseness 06/08/2016  . Vocal cord paralysis 01/31/2016  . Hypercalciuria 01/10/2016  . Cellulitis 11/28/2015  . Cellulitis of trunk 11/27/2015  . Nonspecific abnormal finding in stool contents   . Colon cancer screening   . Venous stasis   . History of DVT (deep vein thrombosis)   . Sepsis (HCLongville06/01/2016  . Panniculitis 10/09/2015  . CLL (chronic lymphocytic leukemia) (HCSturgeon01/26/2017  . 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 Calculus of kidney 10/27/2014  . Leukocytosis (leucocytosis) 08/29/2014  . HNP (herniated nucleus pulposus), cervical 01/21/2013  . bilat hip replacement 04/08/2012  . Morbid obesity (HCEast Gaffney12/12/2011  . Edema 04/08/2012   Past Medical History:  Diagnosis Date  . Anemia   . Arthritis   . BMI 50.0-59.9, adult (HCSouth Carrollton  . Cellulitis 11/2015   trunk  . cll 11/2014   CLL - 11/30/2014- [place] [facility] [place]. DVT (deep venous thrombosis) (HCMonroe6/16   Rt calf  took xarelto for 6 months  . Family history of anesthesia complication    " father having delirium after anesthesia"  . Heart murmur    Hx; of as a child  . History of kidney stones    x2  . Joint pain    Hx: of periodically  . Numbness  and tingling    Hx: of in right arm  . Pneumonia   . Sleep apnea    Cpap use- settings 10   Past Surgical History:  Procedure Laterality Date  . BACK SURGERY    . COLONOSCOPY     Hx: of  . COLONOSCOPY N/A 11/23/2015   Procedure: COLONOSCOPY;  Surgeon: Irene Shipper, MD;  [facility];  Service: Endoscopy;  Laterality: N/A;  . HERNIA REPAIR N/A    Phreesia 03/19/2020  . INGUINAL HERNIA REPAIR Left 06/01/2015   Procedure: LEFT INGUINAL HERNIA REPAIR WITH MESH;  Surgeon: Autumn Messing III, MD;  [facility]: [facility];  Service: General;  Laterality:  Left;  . INGUINAL HERNIA REPAIR    . INSERTION OF MESH Left 06/01/2015   Procedure: INSERTION OF MESH;  Surgeon: Autumn Messing III, MD;  [facility];  Service: General;  Laterality: Left;  . JOINT REPLACEMENT Bilateral    BTHA  . KNEE ARTHROSCOPY Left 07/21/2014   Dr. Noemi Chapel  . LUMBAR LAMINECTOMY  2005  . POSTERIOR CERVICAL LAMINECTOMY WITH MET- RX Right 01/21/2013   Procedure: Right Sitting C6-7 Microdiskectomy with Met-rex;  Surgeon: Kristeen Miss, MD;  Location: [place] [facility];  Service: Neurosurgery;  Laterality: Right;  Right Sitting C6-7 Microdiskectomy with Met-rex  . SPINE SURGERY N/A    Phreesia 03/19/2020  . STENT PLACEMENT RT URETER ([place] HX)  10/23/2014  . tenosynovitis  2000   Hx: of thumb  . TONSILLECTOMY    . TOTAL HIP ARTHROPLASTY Bilateral   . WISDOM TOOTH EXTRACTION  1974   all 4   Allergies  Allergen Reactions  . Adhesive [Tape] Other (See Comments)    Blisters and rash at site  . Other    Prior to Admission medications   Medication Sig Start Date End Date Taking? Authorizing Provider  Ascorbic Acid (VITAMIN C) 1000 MG tablet Take 1,000 mg by mouth daily.   Yes [provider]  aspirin EC 81 MG tablet Take 81 mg by mouth daily.   Yes [provider]  atorvastatin (LIPITOR) 10 MG tablet Take 1 tablet (10 mg total) by mouth daily. 06/23/20  Yes [name], MD  Dulaglutide (TRULICITY) 1.5 GG/8.3MO SOPN Inject 1.5 mg into the skin once a week. 08/19/20  Yes [name], MD  ferrous sulfate 325 (65 FE) MG tablet Take 325 mg by mouth daily with breakfast.   Yes [provider]  fluticasone (FLONASE) 50 MCG/ACT nasal spray SPRAY 2 SPRAYS INTO EACH NOSTRIL EVERY DAY 10/25/18  Yes [name], MD  furosemide (LASIX) 80 MG tablet Take 1 tablet (80 mg total) by mouth 2 (two) times daily. 03/22/20  Yes [name], MD  ibuprofen (ADVIL,MOTRIN) 200 MG tablet Take 600 mg by mouth every 6 (six) hours as needed for mild pain.   Yes  [provider]  KLOR-CON M20 20 MEQ tablet TAKE 1 TABLET BY MOUTH TWICE A DAY 09/28/20  Yes [name], MD  metFORMIN (GLUCOPHAGE) 1000 MG tablet TAKE 1 TABLET BY MOUTH 2 TIMES DAILY WITH A MEAL. 06/23/20  Yes [name], MD  metroNIDAZOLE (METROGEL) 1 % gel Apply topically daily. 12/17/19  Yes [name], MD  Multiple Vitamins-Minerals (MULTIVITAMIN WITH MINERALS) tablet Take 1 tablet by mouth daily.   Yes [provider]  mupirocin ointment (BACTROBAN) 2 % Apply 1 application topically 3 (three) times daily. 12/17/19  Yes [name], MD  Zinc 15 MG CAPS  05/22/19  Yes [provider]   Social History   Socioeconomic History  . Marital status: Divorced    Spouse name: Not on file  . Number of children: 0  . Years of education: Not on file  . Highest education level: Not on file  Occupational History  . Occupation: Geophysical data processor: [employer]  Tobacco Use  . Smoking status: Never Smoker  . Smokeless tobacco: Former Systems developer    Types: Secondary school teacher  . Vaping Use: Never used  Substance and Sexual Activity  . Alcohol use: Yes    Alcohol/week: 1.0 standard drink    Types: 1 Standard drinks or equivalent per week    Comment: BEER AND LIQUOR  . Drug use: No  . Sexual activity: Not Currently  Other Topics Concern  . Not on file  Social History Narrative   Exercise swimming 3-5 times/week for 45 minutes   Social Determinants of Health   Financial Resource Strain: Not on file  Food Insecurity: Not on file  Transportation Needs: Not on file  Physical Activity: Not on file  Stress: Not on file  Social Connections: Not on file  Intimate Partner Violence: Not on file    Review of Systems  Constitutional: Negative for fatigue and unexpected weight change.  Eyes: Negative for visual disturbance.  Respiratory: Negative for cough, chest tightness and shortness of breath.   Cardiovascular: Negative  for chest pain, palpitations and leg swelling.  Gastrointestinal: Negative for abdominal pain and blood in stool.  Neurological: Negative for dizziness, light-headedness and headaches.     Objective:   Vitals:   10/04/20 0947  BP: 132/76  Pulse: 66  Resp: 17  Temp: 98.2 F (36.8 C)  TempSrc: Temporal  SpO2: 97%  Weight: (!) 354 lb (160.6 kg)  Height: _0  (1.753 m)     Physical Exam Vitals reviewed.  Constitutional:      Appearance: He is well-developed. He is obese.  HENT:     Head: Normocephalic and atraumatic.  Eyes:     Pupils: Pupils are equal, round, and reactive to light.  Neck:     Vascular: No carotid bruit or JVD.  Cardiovascular:     Rate and Rhythm: Normal rate and regular rhythm.     Heart sounds: Normal heart sounds. No murmur heard.   Pulmonary:     Effort: Pulmonary effort is normal.     Breath sounds: Normal breath sounds. No rales.  Musculoskeletal:     Right lower leg: No edema.     Left lower leg: No edema.  Skin:    General: Skin is warm and dry.  Neurological:     Mental Status: He is alert and oriented to person, place, and time.        Assessment & Plan:  Bilaal Leib. is a 60 y.o. male . Hyperlipidemia, unspecified hyperlipidemia type - Plan: Comprehensive metabolic panel, Lipid panel, atorvastatin (LIPITOR) 10 MG tablet  -  Stable, tolerating current regimen. Medications refilled. Labs pending as above.   Type 2 diabetes mellitus with hyperglycemia, without long-term current use of insulin (HCC) - Plan: Hemoglobin A1c, Microalbumin / creatinine urine ratio, metFORMIN (GLUCOPHAGE) 1000 MG tablet, Dulaglutide (TRULICITY) 3 XA/1.2IN SOPN  -Increase Trulicity has uncontrolled by last A1c, persistent elevated home readings.  Continue metformin same dose.  Continue exercise, commended on daily swimming.  -Stool regimen discussed with fiber supplement, adequate fluid intake, MiraLAX as needed with RTC precautions.  Meds ordered  this encounter  Medications  . atorvastatin (LIPITOR) 10 MG tablet    Sig: Take 1 tablet (10 mg total) by mouth daily.    Dispense:  90 tablet    Refill:  3  . metFORMIN (GLUCOPHAGE) 1000 MG tablet    Sig: TAKE 1 TABLET BY MOUTH 2 TIMES DAILY WITH A MEAL.    Dispense:  180 tablet    Refill:  1  . Dulaglutide (TRULICITY) 3 FC/1.4QH SOPN    Sig: Inject 3 mg as directed once a week.    Dispense:  6 mL    Refill:  2   Patient Instructions  I will increase trulicity to 25m dose as I expect higher dose will be needed. No other med changes at this time.  Fiber like citrucel or metamucil may be helpful if taken daily, make sure to drink plenty of water, and miralax if needed for constipation.   Recheck levels in 3 months, but let me know if there are questions sooner.           Signed, JMerri Ray MD [facility]

## 2020-10-05 LAB — COMPREHENSIVE METABOLIC PANEL
ALT: 29 U/L (ref 0–53)
AST: 18 U/L (ref 0–37)
Albumin: 4.4 g/dL (ref 3.5–5.2)
Alkaline Phosphatase: 120 U/L — ABNORMAL HIGH (ref 39–117)
BUN: 18 mg/dL (ref 6–23)
CO2: 29 mEq/L (ref 19–32)
Calcium: 8.7 mg/dL (ref 8.4–10.5)
Chloride: 101 mEq/L (ref 96–112)
Creatinine, Ser: 0.77 mg/dL (ref 0.40–1.50)
GFR: 97.47 mL/min (ref >60.00)
Glucose, Bld: 124 mg/dL — ABNORMAL HIGH (ref 70–99)
Potassium: 4.2 mEq/L (ref 3.5–5.1)
Sodium: 140 mEq/L (ref 135–145)
Total Bilirubin: 0.5 mg/dL (ref 0.2–1.2)
Total Protein: 7 g/dL (ref 6.0–8.3)

## 2020-10-05 LAB — LIPID PANEL
Cholesterol: 179 mg/dL (ref 0–200)
HDL: 55.4 mg/dL (ref >39.00)
LDL Cholesterol: 94 mg/dL (ref 0–99)
NonHDL: 123.68
Total CHOL/HDL Ratio: 3
Triglycerides: 147 mg/dL (ref 0.0–149.0)
VLDL: 29.4 mg/dL (ref 0.0–40.0)

## 2020-10-07 ENCOUNTER — Other Ambulatory Visit: Payer: Self-pay | Admitting: Family Medicine

## 2020-10-07 DIAGNOSIS — R609 Edema, unspecified: Secondary | ICD-10-CM

## 2020-10-08 NOTE — Telephone Encounter (Signed)
Patient is requesting a refill of the following medications: Requested Prescriptions   Pending Prescriptions Disp Refills   furosemide (LASIX) 80 MG tablet [Pharmacy Med Name: FUROSEMIDE 80 MG TABLET] 180 tablet 1    Sig: TAKE 1 TABLET BY MOUTH 2 TIMES DAILY.    Date of patient request: 10/07/2020 Last office visit: 10/04/20 Date of last refill: 03/22/20 Last refill amount: 180 Follow up time period per chart: 3 moths

## 2021-01-05 ENCOUNTER — Encounter: Payer: Self-pay | Admitting: Family Medicine

## 2021-01-10 ENCOUNTER — Ambulatory Visit: Payer: BC Managed Care – PPO | Admitting: Family Medicine

## 2021-01-10 ENCOUNTER — Encounter: Payer: Self-pay | Admitting: Family Medicine

## 2021-01-10 ENCOUNTER — Other Ambulatory Visit: Payer: Self-pay

## 2021-01-10 DIAGNOSIS — E1165 Type 2 diabetes mellitus with hyperglycemia: Secondary | ICD-10-CM

## 2021-01-10 DIAGNOSIS — R609 Edema, unspecified: Secondary | ICD-10-CM

## 2021-01-10 LAB — HEMOGLOBIN A1C: Hgb A1c MFr Bld: 8 % — ABNORMAL HIGH (ref 4.6–6.5)

## 2021-01-10 MED ORDER — FUROSEMIDE 80 MG PO TABS: 80.0000 mg | ORAL_TABLET | Freq: Two times a day (BID) | ORAL | 1 refills | Status: DC

## 2021-01-10 MED ORDER — POTASSIUM CHLORIDE CRYS ER 20 MEQ PO TBCR: 20.0000 meq | EXTENDED_RELEASE_TABLET | Freq: Two times a day (BID) | ORAL | 1 refills | Status: DC

## 2021-01-10 MED ORDER — METFORMIN HCL 1000 MG PO TABS: ORAL_TABLET | ORAL | 1 refills | Status: DC

## 2021-01-10 MED ORDER — TRULICITY 3 MG/0.5ML ~~LOC~~ SOAJ: 3.0000 mg | SUBCUTANEOUS | 2 refills | Status: DC

## 2021-01-10 NOTE — Progress Notes (Signed)
Subjective:  Patient ID: Henry Hinton., male    DOB: [birth year]  Age: 60 y.o. MRN: [id number]  CC:  Chief Complaint  Patient presents with   Diabetes    Pt reports sugars below 150 avg 120, pt denies side effects and physical sxs, notes he has used increased trulicity unsure of change.     HPI Henry Hinton. presents for  Diabetes: Complicated by hyperglycemia, obesity. Trulicity increased from 1.5 mg to 3 mg in June.  Continued on metformin 1000 mg twice daily. No side effects with higher dose trulicity.  Followed by nephrology with chronic pedal edema, treated with 80 mg Lasix twice daily, potassium 20 mEq twice daily, and followed by [facility] for CLL. Swimming for exercise daily usually. Getting faster, he is on statin with Lipitor 10 mg QOD. Microalbumin: Normal ratio 10/04/2020 Optho, foot exam, pneumovax: Up-to-date.  Home readings - 120's Postprandial: 140's No symptomatic lows. No 200's. Highest 154.   Wt Readings from Last 3 Encounters:  01/10/21 (!) 359 lb 9.6 oz (163.1 kg)  10/04/20 (!) 354 lb (160.6 kg)  06/23/20 (!) 356 lb (161.5 kg)     Lab Results  Component Value Date   HGBA1C 8.1 (H) 10/04/2020   HGBA1C 8.1 (H) 06/23/2020   HGBA1C 7.5 (H) 03/18/2020   Lab Results  Component Value Date   MICROALBUR <0.7 10/04/2020   [place] 94 10/04/2020   CREATININE 0.77 10/04/2020    History Patient Active Problem List   Diagnosis Date Noted   Acute hypoxemic respiratory failure due to COVID-19 [facility]) 05/18/2019   BPH without obstruction/lower urinary tract symptoms 03/14/2018   Family history of prostate cancer 03/14/2018   History of nephrolithiasis 03/14/2018   Microscopic hematuria 03/14/2018   Diarrhea 11/20/2017   Diabetes mellitus without complication ([place]) 02/54/2706   Iron deficiency anemia 11/19/2017   Influenza, pneumonia 09/22/2017   Abdominal wall cellulitis 01/22/2017   Insect bite 01/22/2017   Hyperkalemia 01/22/2017    OSA (obstructive sleep apnea) 01/22/2017   Essential hypertension    Hoarseness 06/08/2016   Vocal cord paralysis 01/31/2016   Hypercalciuria 01/10/2016   Cellulitis 11/28/2015   Cellulitis of trunk 11/27/2015   Nonspecific abnormal finding in stool contents    Colon cancer screening    Venous stasis    History of DVT (deep vein thrombosis)    Sepsis ([place]) 10/09/2015   Panniculitis 10/09/2015   CLL (chronic lymphocytic leukemia) ([place]) 05/27/2015   Calculi, ureter 10/27/2014   Calculus of kidney 10/27/2014   Leukocytosis (leucocytosis) 08/29/2014   HNP (herniated nucleus pulposus), cervical 01/21/2013   bilat hip replacement 04/08/2012   Morbid obesity ([place]) 04/08/2012   Edema 04/08/2012   Past Medical History:  Diagnosis Date   Anemia    Arthritis    BMI 50.0-59.9, adult ([place])    Cellulitis 11/2015   trunk   cll 11/2014   CLL - 11/30/2014- Dr. [facility] [place], [place]   DVT (deep venous thrombosis) ([place]) 6/16   Rt calf  took xarelto for 6 months   Family history of anesthesia complication    " father having delirium after anesthesia"   Heart murmur    Hx; of as a child   History of kidney stones    x2   Joint pain    Hx: of periodically   Numbness and tingling    Hx: of in right arm   Pneumonia    Sleep apnea    Cpap use-  settings 10   Past Surgical History:  Procedure Laterality Date   BACK SURGERY     COLONOSCOPY     Hx: of   COLONOSCOPY N/A 11/23/2015   Procedure: COLONOSCOPY;  Surgeon: Irene Shipper, MD;  [facility];  Service: Endoscopy;  Laterality: N/A;   HERNIA REPAIR N/A    Phreesia 03/19/2020   INGUINAL HERNIA REPAIR Left 06/01/2015   Procedure: LEFT INGUINAL HERNIA REPAIR WITH MESH;  Surgeon: Autumn Messing III, MD;  [facility];  Service: General;  Laterality: Left;   INGUINAL HERNIA REPAIR     INSERTION OF MESH Left 06/01/2015   Procedure: INSERTION OF MESH;  Surgeon: Autumn Messing III, MD;  [facility];  Service: General;   Laterality: Left;   JOINT REPLACEMENT Bilateral    BTHA   KNEE ARTHROSCOPY Left 07/21/2014   Dr. Noemi Chapel   LUMBAR LAMINECTOMY  2005   POSTERIOR CERVICAL LAMINECTOMY WITH MET- RX Right 01/21/2013   Procedure: Right Sitting C6-7 Microdiskectomy with Met-rex;  Surgeon: Kristeen Miss, MD;  Location: [place] [facility] ORS;  Service: Neurosurgery;  Laterality: Right;  Right Sitting C6-7 Microdiskectomy with Met-rex   SPINE SURGERY N/A    Phreesia 03/19/2020   STENT PLACEMENT RT URETER ([facility] HX)  10/23/2014   tenosynovitis  2000   Hx: of thumb   TONSILLECTOMY     TOTAL HIP ARTHROPLASTY Bilateral    WISDOM TOOTH EXTRACTION  1974   all 4   Allergies  Allergen Reactions   Adhesive [Tape] Other (See Comments)    Blisters and rash at site   Other    Prior to Admission medications   Medication Sig Start Date End Date Taking? Authorizing Provider  Ascorbic Acid (VITAMIN C) 1000 MG tablet Take 1,000 mg by mouth daily.   Yes [provider]  aspirin EC 81 MG tablet Take 81 mg by mouth daily.   Yes [provider]  atorvastatin (LIPITOR) 10 MG tablet Take 1 tablet (10 mg total) by mouth daily. 10/04/20  Yes [name], MD  Dulaglutide (TRULICITY) 3 HE/5.2DP SOPN Inject 3 mg as directed once a week. 10/04/20  Yes [name], MD  ferrous sulfate 325 (65 FE) MG tablet Take 325 mg by mouth daily with breakfast.   Yes [provider]  fluticasone (FLONASE) 50 MCG/ACT nasal spray SPRAY 2 SPRAYS INTO EACH NOSTRIL EVERY DAY 10/25/18  Yes [name], MD  furosemide (LASIX) 80 MG tablet TAKE 1 TABLET BY MOUTH 2 TIMES DAILY. 10/08/20  Yes [name], MD  ibuprofen (ADVIL,MOTRIN) 200 MG tablet Take 600 mg by mouth every 6 (six) hours as needed for mild pain.   Yes [provider]  KLOR-CON M20 20 MEQ tablet TAKE 1 TABLET BY MOUTH TWICE A DAY 09/28/20  Yes [name], MD  metFORMIN (GLUCOPHAGE) 1000 MG tablet TAKE 1 TABLET BY MOUTH 2 TIMES DAILY WITH A MEAL.  10/04/20  Yes [name], MD  metroNIDAZOLE (METROGEL) 1 % gel Apply topically daily. 12/17/19  Yes [name], MD  Multiple Vitamins-Minerals (MULTIVITAMIN WITH MINERALS) tablet Take 1 tablet by mouth daily.   Yes [provider]  mupirocin ointment (BACTROBAN) 2 % Apply 1 application topically 3 (three) times daily. 12/17/19  Yes [name], MD  Zinc 15 MG CAPS  05/22/19  Yes [provider]   Social History   Socioeconomic History   Marital status: Divorced    Spouse name: Not on file  Number of children: 0   Years of education: Not on file   Highest education level: Not on file  Occupational History   Occupation: Conservation officer, nature    Employer: [employer]  Tobacco Use   Smoking status: Never   Smokeless tobacco: Former    Types: Chew    Quit date: 05/01/1993  Vaping Use   Vaping Use: Never used  Substance and Sexual Activity   Alcohol use: Yes    Alcohol/week: 1.0 standard drink    Types: 1 Standard drinks or equivalent per week    Comment: BEER AND LIQUOR   Drug use: No   Sexual activity: Not Currently  Other Topics Concern   Not on file  Social History Narrative   Exercise swimming 3-5 times/week for 45 minutes   Social Determinants of Health   Financial Resource Strain: Not on file  Food Insecurity: Not on file  Transportation Needs: Not on file  Physical Activity: Not on file  Stress: Not on file  Social Connections: Not on file  Intimate Partner Violence: Not on file    Review of Systems  Constitutional:  Negative for fatigue and unexpected weight change.  Eyes:  Negative for visual disturbance.  Respiratory:  Negative for cough, chest tightness and shortness of breath.   Cardiovascular:  Negative for chest pain, palpitations and leg swelling.  Gastrointestinal:  Negative for abdominal pain and blood in stool.  Neurological:  Negative for dizziness, light-headedness and headaches.    Objective:    Vitals:   01/10/21 0839  BP: 130/76  Pulse: 60  Resp: 17  Temp: 97.9 F (36.6 C)  TempSrc: Temporal  SpO2: 97%  Weight: (!) 359 lb 9.6 oz (163.1 kg)  Height: '5\' 9"'  (1.753 m)     Physical Exam Vitals reviewed.  Constitutional:      Appearance: He is well-developed. He is obese.  HENT:     Head: Normocephalic and atraumatic.  Neck:     Vascular: No carotid bruit or JVD.  Cardiovascular:     Rate and Rhythm: Normal rate and regular rhythm.     Heart sounds: Normal heart sounds. No murmur heard. Pulmonary:     Effort: Pulmonary effort is normal.     Breath sounds: Normal breath sounds. No rales.  Musculoskeletal:     Right lower leg: Edema (Trace edema bilaterally with stasis changes, no wounds.) present.     Left lower leg: Edema present.  Skin:    General: Skin is warm and dry.  Neurological:     Mental Status: He is alert and oriented to person, place, and time.  Psychiatric:        Mood and Affect: Mood normal.       Assessment & Plan:  Henry Hinton. is a 60 y.o. male . Type 2 diabetes mellitus with hyperglycemia, without long-term current use of insulin (HCC) - Plan: Hemoglobin A1c, metFORMIN (GLUCOPHAGE) 1000 MG tablet, Dulaglutide (TRULICITY) 3 BZ/1.6RC SOPN  -Tolerating current dose Trulicity, continue same as well as same dose metformin, check A1c, commended on exercise.  23-monthfollow-up  Peripheral edema - Plan: furosemide (LASIX) 80 MG tablet, potassium chloride SA (KLOR-CON M20) 20 MEQ tablet  -Stable, continue same regimen, routine follow-up with nephrology.  Meds ordered this encounter  Medications   metFORMIN (GLUCOPHAGE) 1000 MG tablet    Sig: TAKE 1 TABLET BY MOUTH 2 TIMES DAILY WITH A MEAL.    Dispense:  180 tablet    Refill:  1  Dulaglutide (TRULICITY) 3 NO/6.7EH SOPN    Sig: Inject 3 mg as directed once a week.    Dispense:  6 mL    Refill:  2   furosemide (LASIX) 80 MG tablet    Sig: Take 1 tablet (80 mg total) by mouth 2  (two) times daily.    Dispense:  180 tablet    Refill:  1    DX Code Needed  .   potassium chloride SA (KLOR-CON M20) 20 MEQ tablet    Sig: Take 1 tablet (20 mEq total) by mouth 2 (two) times daily.    Dispense:  180 tablet    Refill:  1   There are no Patient Instructions on file for this visit.    Signed,   [name], MD [facility] 01/10/21 10:25 AM

## 2021-01-22 ENCOUNTER — Other Ambulatory Visit: Payer: Self-pay | Admitting: Family Medicine

## 2021-01-22 ENCOUNTER — Encounter: Payer: Self-pay | Admitting: Family Medicine

## 2021-01-22 DIAGNOSIS — E1165 Type 2 diabetes mellitus with hyperglycemia: Secondary | ICD-10-CM

## 2021-01-22 MED ORDER — TRULICITY 4.5 MG/0.5ML ~~LOC~~ SOAJ: 4.5000 mg | SUBCUTANEOUS | 0 refills | Status: DC

## 2021-03-11 ENCOUNTER — Encounter: Payer: Self-pay | Admitting: Family Medicine

## 2021-03-14 ENCOUNTER — Ambulatory Visit (INDEPENDENT_AMBULATORY_CARE_PROVIDER_SITE_OTHER): Payer: BC Managed Care – PPO

## 2021-03-14 DIAGNOSIS — Z23 Encounter for immunization: Secondary | ICD-10-CM | POA: Diagnosis not present

## 2021-03-14 NOTE — Patient Instructions (Signed)
° ° ° °  If you have lab work done today you will be contacted with your lab results within the next 2 weeks.  If you have not heard from us then please contact us. The fastest way to get your results is to register for My Chart. ° ° °IF you received an x-ray today, you will receive an invoice from [facility]. Please contact [facility] at [phone] with questions or concerns regarding your invoice.  ° °IF you received labwork today, you will receive an invoice from [facility]. Please contact [facility] at [phone] with questions or concerns regarding your invoice.  ° °Our billing staff will not be able to assist you with questions regarding bills from these companies. ° °You will be contacted with the lab results as soon as they are available. The fastest way to get your results is to activate your My Chart account. Instructions are located on the last page of this paperwork. If you have not heard from us regarding the results in 2 weeks, please contact this office. °  ° ° ° °

## 2021-03-14 NOTE — Addendum Note (Signed)
Addended by: Veneda Melter on: 03/14/2021 09:04 AM   Modules accepted: Level of Service

## 2021-04-07 ENCOUNTER — Encounter: Payer: BC Managed Care – PPO | Admitting: Family Medicine

## 2021-04-07 ENCOUNTER — Ambulatory Visit: Payer: BC Managed Care – PPO | Admitting: Family Medicine

## 2021-04-14 ENCOUNTER — Ambulatory Visit: Payer: BC Managed Care – PPO | Admitting: Family Medicine

## 2021-04-28 ENCOUNTER — Ambulatory Visit: Payer: BC Managed Care – PPO | Admitting: Family Medicine

## 2021-05-18 ENCOUNTER — Encounter: Payer: Self-pay | Admitting: Family Medicine

## 2021-05-19 ENCOUNTER — Encounter: Payer: Self-pay | Admitting: Family Medicine

## 2021-05-19 ENCOUNTER — Telehealth: Payer: BC Managed Care – PPO | Admitting: Family Medicine

## 2021-05-19 DIAGNOSIS — J069 Acute upper respiratory infection, unspecified: Secondary | ICD-10-CM

## 2021-05-19 MED ORDER — PROMETHAZINE-DM 6.25-15 MG/5ML PO SYRP: 2.5000 mL | ORAL_SOLUTION | Freq: Three times a day (TID) | ORAL | 0 refills | Status: DC | PRN

## 2021-05-19 MED ORDER — FLUTICASONE PROPIONATE 50 MCG/ACT NA SUSP: 2.0000 | Freq: Every day | NASAL | 0 refills | Status: AC

## 2021-05-19 NOTE — Patient Instructions (Signed)

## 2021-05-19 NOTE — Progress Notes (Signed)
Virtual Visit Consent   Henry Ray Domonic Kimball., you are scheduled for a virtual visit with a [facility] provider today.     Just as with appointments in the office, your consent must be obtained to participate.  Your consent will be active for this visit and any virtual visit you may have with one of our providers in the next 365 days.     If you have a MyChart account, a copy of this consent can be sent to you electronically.  All virtual visits are billed to your insurance company just like a traditional visit in the office.    As this is a virtual visit, video technology does not allow for your provider to perform a traditional examination.  This may limit your provider's ability to fully assess your condition.  If your provider identifies any concerns that need to be evaluated in person or the need to arrange testing (such as labs, EKG, etc.), we will make arrangements to do so.     Although advances in technology are sophisticated, we cannot ensure that it will always work on either your end or our end.  If the connection with a video visit is poor, the visit may have to be switched to a telephone visit.  With either a video or telephone visit, we are not always able to ensure that we have a secure connection.     I need to obtain your verbal consent now.   Are you willing to proceed with your visit today?    Henry Ray Kenzo Ozment. has provided verbal consent on 05/19/2021 for a virtual visit (video or telephone).   Perlie Mayo, NP   Date: 05/19/2021 9:19 AM   Virtual Visit via Video Note   I, Perlie Mayo, connected with  Henry Hinton.  ([id number], 1960/07/12) on 05/19/21 at  9:15 AM EST by a video-enabled telemedicine application and verified that I am speaking with the correct person using two identifiers.  Location: Patient: Virtual Visit Location Patient: Home Provider: Virtual Visit Location Provider: Home Office   I discussed the limitations of evaluation and  management by telemedicine and the availability of in person appointments. The patient expressed understanding and agreed to proceed.    History of Present Illness: Henry Ray Jayceion Lisenby. is a 61 y.o. who identifies as a male who was assigned male at birth, and is being seen today for   No known exposure to covid, unsure of flu.   HPI: Sinusitis This is a new problem. The current episode started in the past 7 days. The problem has been rapidly worsening since onset. Maximum temperature: 100.2. The fever has been present for 1 to 2 days. Associated symptoms include congestion, coughing, ear pain, headaches, a hoarse voice, sneezing and a sore throat. Pertinent negatives include no chills, diaphoresis, neck pain, shortness of breath, sinus pressure or swollen glands. Past treatments include spray decongestants.    Reports starting to feel a bit better- but is not 100%.  Problems:  Patient Active Problem List   Diagnosis Date Noted   Acute hypoxemic respiratory failure due to COVID-19 [facility]) 05/18/2019   BPH without obstruction/lower urinary tract symptoms 03/14/2018   Family history of prostate cancer 03/14/2018   History of nephrolithiasis 03/14/2018   Microscopic hematuria 03/14/2018   Diarrhea 11/20/2017   Diabetes mellitus without complication ([place]) 99/24/2683   Iron deficiency anemia 11/19/2017   Influenza, pneumonia 09/22/2017   Abdominal wall cellulitis 01/22/2017   Insect bite  01/22/2017   Hyperkalemia 01/22/2017   OSA (obstructive sleep apnea) 01/22/2017   Essential hypertension    Hoarseness 06/08/2016   Vocal cord paralysis 01/31/2016   Hypercalciuria 01/10/2016   Cellulitis 11/28/2015   Cellulitis of trunk 11/27/2015   Nonspecific abnormal finding in stool contents    Colon cancer screening    Venous stasis    History of DVT (deep vein thrombosis)    Sepsis ([place]) 10/09/2015   Panniculitis 10/09/2015   CLL (chronic lymphocytic leukemia) ([place]) 05/27/2015   Calculi,  ureter 10/27/2014   Calculus of kidney 10/27/2014   Leukocytosis (leucocytosis) 08/29/2014   HNP (herniated nucleus pulposus), cervical 01/21/2013   bilat hip replacement 04/08/2012   Morbid obesity ([place]) 04/08/2012   Edema 04/08/2012    Allergies:  Allergies  Allergen Reactions   Adhesive [Tape] Other (See Comments)    Blisters and rash at site   Other    Medications:  Current Outpatient Medications:    Ascorbic Acid (VITAMIN C) 1000 MG tablet, Take 1,000 mg by mouth daily., Disp: , Rfl:    aspirin EC 81 MG tablet, Take 81 mg by mouth daily., Disp: , Rfl:    atorvastatin (LIPITOR) 10 MG tablet, Take 1 tablet (10 mg total) by mouth daily., Disp: 90 tablet, Rfl: 3   Dulaglutide (TRULICITY) 4.5 OX/7.3ZH SOPN, Inject 4.5 mg as directed once a week., Disp: 6 mL, Rfl: 0   ferrous sulfate 325 (65 FE) MG tablet, Take 325 mg by mouth daily with breakfast., Disp: , Rfl:    fluticasone (FLONASE) 50 MCG/ACT nasal spray, SPRAY 2 SPRAYS INTO EACH NOSTRIL EVERY DAY, Disp: 48 mL, Rfl: 2   furosemide (LASIX) 80 MG tablet, Take 1 tablet (80 mg total) by mouth 2 (two) times daily., Disp: 180 tablet, Rfl: 1   ibuprofen (ADVIL,MOTRIN) 200 MG tablet, Take 600 mg by mouth every 6 (six) hours as needed for mild pain., Disp: , Rfl:    metFORMIN (GLUCOPHAGE) 1000 MG tablet, TAKE 1 TABLET BY MOUTH 2 TIMES DAILY WITH A MEAL., Disp: 180 tablet, Rfl: 1   metroNIDAZOLE (METROGEL) 1 % gel, Apply topically daily., Disp: 45 g, Rfl: 0   Multiple Vitamins-Minerals (MULTIVITAMIN WITH MINERALS) tablet, Take 1 tablet by mouth daily., Disp: , Rfl:    mupirocin ointment (BACTROBAN) 2 %, Apply 1 application topically 3 (three) times daily., Disp: 22 g, Rfl: 1   potassium chloride SA (KLOR-CON M20) 20 MEQ tablet, Take 1 tablet (20 mEq total) by mouth 2 (two) times daily., Disp: 180 tablet, Rfl: 1   Zinc 15 MG CAPS, , Disp: , Rfl:   Observations/Objective: Patient is well-developed, well-nourished in no acute distress.   Resting comfortably  at home.  Head is normocephalic, atraumatic.  No labored breathing.  Speech is clear and coherent with logical content.  Patient is alert and oriented at baseline.    Assessment and Plan: 1. URI with cough and congestion S&S consistent with above Dx Tx below Review of Hx -no red flags for PNA or other illness needing in person assessment.  Advised f/u if not improved by Monday (7 days post start) If flu he is outside the window for Tamiflu- monitor and symptom manage is best for now.   Reviewed side effects, risks and benefits of medication.   Patient acknowledged agreement and understanding of the plan.    - fluticasone (FLONASE) 50 MCG/ACT nasal spray; Place 2 sprays into both nostrils daily.  Dispense: 16 g; Refill: 0 - promethazine-dextromethorphan (PROMETHAZINE-DM) 6.25-15 MG/5ML  syrup; Take 2.5 mLs by mouth 3 (three) times daily as needed for cough.  Dispense: 118 mL; Refill: 0    Follow Up Instructions: I discussed the assessment and treatment plan with the patient. The patient was provided an opportunity to ask questions and all were answered. The patient agreed with the plan and demonstrated an understanding of the instructions.  A copy of instructions were sent to the patient via MyChart unless otherwise noted below.     The patient was advised to call back or seek an in-person evaluation if the symptoms worsen or if the condition fails to improve as anticipated.  Time:  I spent 10 minutes with the patient via telehealth technology discussing the above problems/concerns.    Perlie Mayo, NP

## 2021-05-20 ENCOUNTER — Encounter: Payer: Self-pay | Admitting: Family Medicine

## 2021-05-20 MED ORDER — AZITHROMYCIN 250 MG PO TABS: ORAL_TABLET | ORAL | 0 refills | Status: AC

## 2021-05-23 ENCOUNTER — Ambulatory Visit: Payer: BC Managed Care – PPO | Admitting: Family Medicine

## 2021-06-02 ENCOUNTER — Encounter: Payer: Self-pay | Admitting: Family Medicine

## 2021-06-02 ENCOUNTER — Ambulatory Visit: Payer: BC Managed Care – PPO | Admitting: Family Medicine

## 2021-06-02 VITALS — BP 132/78 | HR 87 | Temp 98.3°F | Resp 17 | Ht 69.0 in | Wt 357.6 lb

## 2021-06-02 DIAGNOSIS — R609 Edema, unspecified: Secondary | ICD-10-CM

## 2021-06-02 DIAGNOSIS — R059 Cough, unspecified: Secondary | ICD-10-CM

## 2021-06-02 DIAGNOSIS — E1165 Type 2 diabetes mellitus with hyperglycemia: Secondary | ICD-10-CM

## 2021-06-02 DIAGNOSIS — G4733 Obstructive sleep apnea (adult) (pediatric): Secondary | ICD-10-CM | POA: Diagnosis not present

## 2021-06-02 DIAGNOSIS — Z9989 Dependence on other enabling machines and devices: Secondary | ICD-10-CM

## 2021-06-02 DIAGNOSIS — E785 Hyperlipidemia, unspecified: Secondary | ICD-10-CM

## 2021-06-02 LAB — COMPREHENSIVE METABOLIC PANEL
ALT: 37 U/L (ref 0–53)
AST: 26 U/L (ref 0–37)
Albumin: 4.3 g/dL (ref 3.5–5.2)
Alkaline Phosphatase: 117 U/L (ref 39–117)
BUN: 21 mg/dL (ref 6–23)
CO2: 33 mEq/L — ABNORMAL HIGH (ref 19–32)
Calcium: 9.7 mg/dL (ref 8.4–10.5)
Chloride: 101 mEq/L (ref 96–112)
Creatinine, Ser: 0.8 mg/dL (ref 0.40–1.50)
GFR: 95.91 mL/min (ref >60.00)
Glucose, Bld: 154 mg/dL — ABNORMAL HIGH (ref 70–99)
Potassium: 4.2 mEq/L (ref 3.5–5.1)
Sodium: 142 mEq/L (ref 135–145)
Total Bilirubin: 0.5 mg/dL (ref 0.2–1.2)
Total Protein: 7 g/dL (ref 6.0–8.3)

## 2021-06-02 LAB — LIPID PANEL
Cholesterol: 180 mg/dL (ref 0–200)
HDL: 60 mg/dL (ref >39.00)
LDL Cholesterol: 92 mg/dL (ref 0–99)
NonHDL: 120.41
Total CHOL/HDL Ratio: 3
Triglycerides: 142 mg/dL (ref 0.0–149.0)
VLDL: 28.4 mg/dL (ref 0.0–40.0)

## 2021-06-02 LAB — HEMOGLOBIN A1C: Hgb A1c MFr Bld: 8.6 % — ABNORMAL HIGH (ref 4.6–6.5)

## 2021-06-02 MED ORDER — METFORMIN HCL 1000 MG PO TABS: ORAL_TABLET | ORAL | 1 refills | Status: DC

## 2021-06-02 MED ORDER — POTASSIUM CHLORIDE CRYS ER 20 MEQ PO TBCR: 20.0000 meq | EXTENDED_RELEASE_TABLET | Freq: Two times a day (BID) | ORAL | 1 refills | Status: DC

## 2021-06-02 MED ORDER — TRULICITY 4.5 MG/0.5ML ~~LOC~~ SOAJ: 4.5000 mg | SUBCUTANEOUS | 0 refills | Status: DC

## 2021-06-02 MED ORDER — FUROSEMIDE 80 MG PO TABS: 80.0000 mg | ORAL_TABLET | Freq: Two times a day (BID) | ORAL | 1 refills | Status: DC

## 2021-06-02 MED ORDER — ATORVASTATIN CALCIUM 10 MG PO TABS: 10.0000 mg | ORAL_TABLET | Freq: Every day | ORAL | 3 refills | Status: DC

## 2021-06-02 MED ORDER — BENZONATATE 100 MG PO CAPS: 100.0000 mg | ORAL_CAPSULE | Freq: Three times a day (TID) | ORAL | 0 refills | Status: DC | PRN

## 2021-06-02 NOTE — Patient Instructions (Signed)
No change in meds for now.  I expect A1c to be improved at the higher dose of Trulicity, but diet changes should help as well.  Tessalon Perles if needed for cough but expect that to be improving as time goes on next few weeks.  If any worsening symptoms or persistent cough, let me know and we can certainly check a chest x-ray. I will order referral to sleep specialist.

## 2021-06-02 NOTE — Progress Notes (Signed)
Subjective:  Patient ID: Henry Navy., male    DOB: [birth year]  Age: 61 y.o. MRN: [id number]  CC:  Chief Complaint  Patient presents with   Sleep Apnea    Pt reports needs new sleep study, needs referral needs new machine    Cough    Pt reports has dry cough that has continued from recent upper respiratory infection.     HPI Henry Hinton. presents for   Diabetes: Complicated by hyperglycemia, obesity.  Discussed adjustment of  meds last visit, planned on restricting carbs and sweets. Trulicity was increased to 4.5 mg weekly.  Continue metformin 1000 mg twice daily.  He is on statin with Lipitor.  Lasix 80 mg daily for chronic lower extremity edema followed by hematology for CLL.  Appointment with Dr.Chinnasami 03/17/21.  Blood counts relatively stable off treatment.  Monitor for new symptoms including chest pain, shortness of breath.  Elevated white blood cell count but hemoglobin and platelet count were stable. Swimming for exercise when not traveling, or sick.  Microalbumin: Normal ratio 10/04/2020 Optho, foot exam, pneumovax: Up-to-date.  Ophthalmology eval - in December - no concerns, Dr. Prudencio Burly.  Home readings: Fasting 150 this morning. Usually 120's. Recent travel with decreased diet adherence.  No symptomatic lows.  No new side effects with meds, higher dose.   Lab Results  Component Value Date   HGBA1C 8.6 (H) 06/02/2021   HGBA1C 8.0 (H) 01/10/2021   HGBA1C 8.1 (H) 10/04/2020   Lab Results  Component Value Date   MICROALBUR <0.7 10/04/2020   LDLCALC 92 06/02/2021   CREATININE 0.80 06/02/2021   Wt Readings from Last 3 Encounters:  06/02/21 (!) 357 lb 9.6 oz (162.2 kg)  01/10/21 (!) 359 lb 9.6 oz (163.1 kg)  10/04/20 (!) 354 lb (160.6 kg)  Goal of 50# weight loss.   Cough Reports dry cough since recent infection.  Virtual visit January 19.  Thought to have upper respiratory infection, few days later on January 20th was started on Z-Pak to cover for  possible bronchitis or atypical pneumonia.  Much better than initial, just slight dry residual cough here or there - with talking. Clear mucus, no current dyspnea, fever, chest pain.  Obstructive sleep apnea Using CPAP daily. Machine 61 yrs old. Needs smaller machine. No local sleep specialist. No recent sleep study.    History Patient Active Problem List   Diagnosis Date Noted   Acute hypoxemic respiratory failure due to COVID-19 ([place]) 05/18/2019   BPH without obstruction/lower urinary tract symptoms 03/14/2018   Family history of prostate cancer 03/14/2018   History of nephrolithiasis 03/14/2018   Microscopic hematuria 03/14/2018   Diarrhea 11/20/2017   Diabetes mellitus without complication ([place]) 97/06/6376   Iron deficiency anemia 11/19/2017   Influenza, pneumonia 09/22/2017   Abdominal wall cellulitis 01/22/2017   Insect bite 01/22/2017   Hyperkalemia 01/22/2017   OSA (obstructive sleep apnea) 01/22/2017   Essential hypertension    Hoarseness 06/08/2016   Vocal cord paralysis 01/31/2016   Hypercalciuria 01/10/2016   Cellulitis 11/28/2015   Cellulitis of trunk 11/27/2015   Nonspecific abnormal finding in stool contents    Colon cancer screening    Venous stasis    History of DVT (deep vein thrombosis)    Sepsis ([place]) 10/09/2015   Panniculitis 10/09/2015   CLL (chronic lymphocytic leukemia) ([place]) 05/27/2015   Calculi, ureter 10/27/2014   Calculus of kidney 10/27/2014   Leukocytosis (leucocytosis) 08/29/2014   HNP (herniated nucleus pulposus), cervical 01/21/2013  bilat hip replacement 04/08/2012   Morbid obesity ([place]) 04/08/2012   Edema 04/08/2012   Past Medical History:  Diagnosis Date   Anemia    Arthritis    BMI 50.0-59.9, adult ([place])    Cellulitis 11/2015   trunk   cll 11/2014   CLL - 11/30/2014- Dr. [facility] [place] [place]   DVT (deep venous thrombosis) ([place]) 6/16   Rt calf  took xarelto for 6 months   Family history of anesthesia  complication    " father having delirium after anesthesia"   Heart murmur    Hx; of as a child   History of kidney stones    x2   Joint pain    Hx: of periodically   Numbness and tingling    Hx: of in right arm   Pneumonia    Sleep apnea    Cpap use- settings 10   Past Surgical History:  Procedure Laterality Date   BACK SURGERY     COLONOSCOPY     Hx: of   COLONOSCOPY N/A 11/23/2015   Procedure: COLONOSCOPY;  Surgeon: Irene Shipper, MD;  Location: [facility];  Service: Endoscopy;  Laterality: N/A;   HERNIA REPAIR N/A    [employer] 03/19/2020   INGUINAL HERNIA REPAIR Left 06/01/2015   Procedure: LEFT INGUINAL HERNIA REPAIR WITH MESH;  Surgeon: Autumn Messing III, MD;  Location: [facility]S;  Service: General;  Laterality: Left;   INGUINAL HERNIA REPAIR     INSERTION OF MESH Left 06/01/2015   Procedure: INSERTION OF MESH;  Surgeon: Autumn Messing III, MD;  Location: [facility];  Service: General;  Laterality: Left;   JOINT REPLACEMENT Bilateral    BTHA   KNEE ARTHROSCOPY Left 07/21/2014   Dr. Noemi Chapel   LUMBAR LAMINECTOMY  2005   POSTERIOR CERVICAL LAMINECTOMY WITH MET- RX Right 01/21/2013   Procedure: Right Sitting C6-7 Microdiskectomy with Met-rex;  Surgeon: Kristeen Miss, MD;  Location: [facility];  Service: Neurosurgery;  Laterality: Right;  Right Sitting C6-7 Microdiskectomy with Met-rex   SPINE SURGERY N/A    [employer] 03/19/2020   STENT PLACEMENT RT URETER ([facility] HX)  10/23/2014   tenosynovitis  2000   Hx: of thumb   TONSILLECTOMY     TOTAL HIP ARTHROPLASTY Bilateral    WISDOM TOOTH EXTRACTION  1974   all 4   Allergies  Allergen Reactions   Adhesive [Tape] Other (See Comments)    Blisters and rash at site   Other    Prior to Admission medications   Medication Sig Start Date End Date Taking? Authorizing Provider  Ascorbic Acid (VITAMIN C) 1000 MG tablet Take 1,000 mg by mouth daily.   Yes [provider]  aspirin EC 81 MG tablet Take 81 mg by mouth daily.   Yes [provider]  atorvastatin (LIPITOR) 10 MG tablet Take 1 tablet (10 mg total) by mouth daily. 10/04/20  Yes [name], MD  Dulaglutide (TRULICITY) 4.5 AJ/2.8NO SOPN Inject 4.5 mg as directed once a week. 01/22/21  Yes [name], MD  ferrous sulfate 325 (65 FE) MG tablet Take 325 mg by mouth daily with breakfast.   Yes [provider]  fluticasone (FLONASE) 50 MCG/ACT nasal spray Place 2 sprays into both nostrils daily. 05/19/21  Yes Perlie Mayo, NP  furosemide (LASIX) 80 MG tablet Take 1 tablet (80 mg total) by mouth 2 (two) times daily. 01/10/21  Yes [name], MD  ibuprofen (ADVIL,MOTRIN) 200 MG tablet Take  600 mg by mouth every 6 (six) hours as needed for mild pain.   Yes [provider]  metFORMIN (GLUCOPHAGE) 1000 MG tablet TAKE 1 TABLET BY MOUTH 2 TIMES DAILY WITH A MEAL. 01/10/21  Yes [name], MD  Multiple Vitamins-Minerals (MULTIVITAMIN WITH MINERALS) tablet Take 1 tablet by mouth daily.   Yes [provider]  mupirocin ointment (BACTROBAN) 2 % Apply 1 application topically 3 (three) times daily. 12/17/19  Yes [name], MD  potassium chloride SA (KLOR-CON M20) 20 MEQ tablet Take 1 tablet (20 mEq total) by mouth 2 (two) times daily. 01/10/21  Yes [name], MD  promethazine-dextromethorphan (PROMETHAZINE-DM) 6.25-15 MG/5ML syrup Take 2.5 mLs by mouth 3 (three) times daily as needed for cough. 05/19/21  Yes Perlie Mayo, NP  Zinc 15 MG CAPS  05/22/19  Yes [provider]  metroNIDAZOLE (METROGEL) 1 % gel Apply topically daily. Patient not taking: Reported on 06/02/2021 12/17/19   [name], MD   Social History   Socioeconomic History   Marital status: Divorced    Spouse name: Not on file   Number of children: 0   Years of education: Not on file   Highest education level: Not on file  Occupational History   Occupation: Conservation officer, nature    Employer: [employer]  Tobacco Use    Smoking status: Never   Smokeless tobacco: Former    Types: Chew    Quit date: 05/01/1993  Vaping Use   Vaping Use: Never used  Substance and Sexual Activity   Alcohol use: Yes    Alcohol/week: 1.0 standard drink    Types: 1 Standard drinks or equivalent per week    Comment: BEER AND LIQUOR   Drug use: No   Sexual activity: Not Currently  Other Topics Concern   Not on file  Social History Narrative   Exercise swimming 3-5 times/week for 45 minutes   Social Determinants of Health   Financial Resource Strain: Not on file  Food Insecurity: Not on file  Transportation Needs: Not on file  Physical Activity: Not on file  Stress: Not on file  Social Connections: Not on file  Intimate Partner Violence: Not on file    Review of Systems  Constitutional:  Negative for fatigue and unexpected weight change.  Eyes:  Negative for visual disturbance.  Respiratory:  Positive for cough. Negative for chest tightness and shortness of breath.   Cardiovascular:  Negative for chest pain, palpitations and leg swelling.  Gastrointestinal:  Negative for abdominal pain and blood in stool.  Neurological:  Negative for dizziness, light-headedness and headaches.   Objective:   Vitals:   06/02/21 1314  BP: 132/78  Pulse: 87  Resp: 17  Temp: 98.3 F (36.8 C)  TempSrc: Temporal  SpO2: 98%  Weight: (!) 357 lb 9.6 oz (162.2 kg)  Height: '5\' 9"'  (1.753 m)     Physical Exam Vitals reviewed.  Constitutional:      Appearance: Normal appearance. He is well-developed.  HENT:     Head: Normocephalic and atraumatic.  Neck:     Vascular: No carotid bruit or JVD.  Cardiovascular:     Rate and Rhythm: Normal rate and regular rhythm.     Heart sounds: Normal heart sounds. No murmur heard. Pulmonary:     Effort: Pulmonary effort is normal. No respiratory distress.     Breath sounds: Normal breath sounds. No wheezing or rales.  Musculoskeletal:     Right lower leg:  Edema (Trace to 1+ but soft, lower  extremities bilaterally.) present.     Left lower leg: Edema present.  Skin:    General: Skin is warm and dry.  Neurological:     Mental Status: He is alert and oriented to person, place, and time.  Psychiatric:        Mood and Affect: Mood normal.        Behavior: Behavior normal.       Assessment & Plan:  Henry Roughton. is a 61 y.o. male . Cough, unspecified type - Plan: benzonatate (TESSALON) 100 MG capsule  -Likely postinfectious cough.  Lungs clear on exam.  Tessalon Perles prescribed.  Status post azithromycin.  Overall improved.  RTC precautions given, option of imaging if not continuing to improve in the next week or two.  sooner if worse.  Type 2 diabetes mellitus with hyperglycemia, without long-term current use of insulin (HCC) - Plan: Dulaglutide (TRULICITY) 4.5 IR/5.1OA SOPN, Lipid panel, Comprehensive metabolic panel, Hemoglobin A1c, metFORMIN (GLUCOPHAGE) 1000 MG tablet  -Check updated A1c.  Some dietary indiscretion recently but plans improved exercise, diet, weight loss goal as above.  Continue Trulicity, metformin current doses for now.  OSA on CPAP - Plan: Ambulatory referral to Sleep Studies  -Refer for likely updated testing for new equipment versus AutoPap.  Hyperlipidemia, unspecified hyperlipidemia type - Plan: atorvastatin (LIPITOR) 10 MG tablet  -  Stable, tolerating current regimen. Medications refilled. Labs pending as above.   Peripheral edema - Plan: potassium chloride SA (KLOR-CON M20) 20 MEQ tablet, furosemide (LASIX) 80 MG tablet  -Stable with Lasix, check electrolytes above.  Continue same dose.  Meds ordered this encounter  Medications   Dulaglutide (TRULICITY) 4.5 CZ/6.6AY SOPN    Sig: Inject 4.5 mg as directed once a week.    Dispense:  6 mL    Refill:  0   benzonatate (TESSALON) 100 MG capsule    Sig: Take 1 capsule (100 mg total) by mouth 3 (three) times daily as needed for cough.    Dispense:  20 capsule    Refill:  0   metFORMIN  (GLUCOPHAGE) 1000 MG tablet    Sig: TAKE 1 TABLET BY MOUTH 2 TIMES DAILY WITH A MEAL.    Dispense:  180 tablet    Refill:  1   atorvastatin (LIPITOR) 10 MG tablet    Sig: Take 1 tablet (10 mg total) by mouth daily.    Dispense:  90 tablet    Refill:  3   potassium chloride SA (KLOR-CON M20) 20 MEQ tablet    Sig: Take 1 tablet (20 mEq total) by mouth 2 (two) times daily.    Dispense:  180 tablet    Refill:  1   furosemide (LASIX) 80 MG tablet    Sig: Take 1 tablet (80 mg total) by mouth 2 (two) times daily.    Dispense:  180 tablet    Refill:  1    DX Code Needed  .   Patient Instructions  No change in meds for now.  I expect A1c to be improved at the higher dose of Trulicity, but diet changes should help as well.  Tessalon Perles if needed for cough but expect that to be improving as time goes on next few weeks.  If any worsening symptoms or persistent cough, let me know and we can certainly check a chest x-ray. I will order referral to sleep specialist.        Signed,  [name], MD [place] 06/02/21 7:01 PM

## 2021-06-06 ENCOUNTER — Ambulatory Visit: Payer: BC Managed Care – PPO | Admitting: Family Medicine

## 2021-06-12 ENCOUNTER — Encounter: Payer: Self-pay | Admitting: Family Medicine

## 2021-06-13 NOTE — Telephone Encounter (Signed)
Pt declined additional medication, reports he is working on lifestyle changes to help with this naturally.

## 2021-07-26 DIAGNOSIS — N529 Male erectile dysfunction, unspecified: Secondary | ICD-10-CM | POA: Insufficient documentation

## 2021-08-04 ENCOUNTER — Encounter: Payer: Self-pay | Admitting: Family Medicine

## 2021-08-04 NOTE — Telephone Encounter (Signed)
Pt is asking if there are interactions between his meds and tadafil.  ? ?Please advise  ?

## 2021-08-16 ENCOUNTER — Ambulatory Visit: Payer: BC Managed Care – PPO | Admitting: Neurology

## 2021-08-16 ENCOUNTER — Encounter: Payer: Self-pay | Admitting: Neurology

## 2021-08-16 VITALS — BP 131/83 | HR 65 | Ht 69.0 in | Wt 351.6 lb

## 2021-08-16 DIAGNOSIS — G4733 Obstructive sleep apnea (adult) (pediatric): Secondary | ICD-10-CM | POA: Diagnosis not present

## 2021-08-16 DIAGNOSIS — R634 Abnormal weight loss: Secondary | ICD-10-CM | POA: Diagnosis not present

## 2021-08-16 DIAGNOSIS — Z820 Family history of epilepsy and other diseases of the nervous system: Secondary | ICD-10-CM

## 2021-08-16 DIAGNOSIS — Z6841 Body Mass Index (BMI) 40.0 and over, adult: Secondary | ICD-10-CM

## 2021-08-16 DIAGNOSIS — Z9989 Dependence on other enabling machines and devices: Secondary | ICD-10-CM

## 2021-08-16 DIAGNOSIS — R351 Nocturia: Secondary | ICD-10-CM | POA: Diagnosis not present

## 2021-08-16 NOTE — Progress Notes (Signed)
Subjective:  ?  ?Patient ID: Henry Hinton. is a 61 y.o. male. ? ?HPI ? ? ? ?Star Age, MD, PhD ?[facility] ?[address], [address] ?[phone] ?[place], [place] [address] ? ?Dear Dr. Carlota Raspberry,  ? ?I saw your patient, Henry Hinton, upon your kind request in my sleep clinic today for initial consultation of his sleep disorder, in particular, evaluation of his prior diagnosis of obstructive sleep apnea.  The patient is unaccompanied today.  As you know, Henry Hinton is a 61 year old right-handed gentleman with an underlying medical history of cellulitis, CLL, DVT, anemia, arthritis, history of kidney stones, status post back surgery, status post cervical laminectomy, status post ureteral stent placement, tonsillectomy, bilateral hip replacements, and morbid obesity with a BMI of over 50, who was previously diagnosed with obstructive sleep apnea and placed on positive airway pressure treatment.  Prior sleep study results are not available for my review today.  His last sleep study was somewhere around 07/08/2007 or 07/07/09.  He has 2 different machines, uses 1 for travel and 1 for home.  He has been using nasal pillows.  He currently does not have a DME company and prior sleep study results are not available for my review today, compliance download is not possible from his machine, he has a Field seismologist.  His Epworth sleepiness score is 6 out of 24, fatigue severity score is 26 out of 63.  He is compliant with treatment and has benefited over time.  As he recalls, he was diagnosed with severe obstructive sleep apnea many years ago.  He reports that he has had weight fluctuation, his maximum weight was 406.  He had a tonsillectomy as a child.  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  He has a TV in his bedroom,  he goes to bed between 11 and midnight and rise time is around 630.  He has no significant morning headaches and has nocturia about once per average night.  His mom also has sleep apnea.  His father passed away in 2011-07-08.  He drinks caffeine in the form of coffee, about 2 cups/day and 1 diet soda about 1 bottle per day.  He drinks alcohol about once a week.  He is a non-smoker. ? ? ?His Past Medical History Is Significant For: ?Past Medical History:  ?Diagnosis Date  ? Anemia   ? Arthritis   ? BMI 50.0-59.9, adult ([place])   ? Cellulitis 11/2015  ? trunk  ? cll 11/2014  ? CLL - 11/30/2014- Dr. [facility] [place],   ? DVT (deep venous thrombosis) ([place]) 6/16  ? Rt calf  took xarelto for 6 months  ? Family history of anesthesia complication   ? " father having delirium after anesthesia"  ? Heart murmur   ? Hx; of as a child  ? History of kidney stones   ? x2  ? Joint pain   ? Hx: of periodically  ? Numbness and tingling   ? Hx: of in right arm  ? Pneumonia   ? Sleep apnea   ? Cpap use- settings 10  ? ? ?His Past Surgical History Is Significant For: ?Past Surgical History:  ?Procedure Laterality Date  ? BACK SURGERY    ? COLONOSCOPY    ?  Hx: of  ? COLONOSCOPY N/A 11/23/2015  ? Procedure: COLONOSCOPY;  Surgeon: Irene Shipper, MD;  [facility];  Service: Endoscopy;  Laterality: N/A;  ? HERNIA REPAIR N/A   ? Phreesia 03/19/2020  ? INGUINAL HERNIA REPAIR Left 06/01/2015  ? Procedure: LEFT INGUINAL HERNIA REPAIR WITH MESH;  Surgeon: Autumn Messing III, MD;  Location: [facility];  Service: General;  Laterality: Left;  ? INGUINAL HERNIA REPAIR    ? INSERTION OF MESH Left 06/01/2015  ? Procedure: INSERTION OF MESH;  Surgeon: Autumn Messing III, MD;  [facility]: [facility];  Service: General;  Laterality: Left;  ? JOINT REPLACEMENT Bilateral   ? BTHA  ? KNEE ARTHROSCOPY Left 07/21/2014  ? Dr. Noemi Chapel  ? LUMBAR LAMINECTOMY  2005  ? POSTERIOR CERVICAL LAMINECTOMY WITH MET- RX Right 01/21/2013  ? Procedure: Right Sitting C6-7  Microdiskectomy with Met-rex;  Surgeon: Kristeen Miss, MD;  Location: [facility];  Service: Neurosurgery;  Laterality: Right;  Right Sitting C6-7 Microdiskectomy with Met-rex  ? SPINE SURGERY N/A   ? Phreesia 03/19/2020  ? STENT PLACEMENT RT URETER ([facility] HX)  10/23/2014  ? tenosynovitis  2000  ? Hx: of thumb  ? TONSILLECTOMY    ? TOTAL HIP ARTHROPLASTY Bilateral   ? [place] EXTRACTION  1974  ? all 4  ? ? ?His Family History Is Significant For: ?Family History  ?Problem Relation Age of Onset  ? COPD Maternal Grandmother   ? Heart disease Maternal Grandfather   ? Lung cancer Paternal Grandfather   ?     LUNG  ? Diabetes Father   ? Prostate cancer Father   ?     PROSTATE  ? ? ?His Social History Is Significant For: ?Social History  ? ?Socioeconomic History  ? Marital status: Divorced  ?  Spouse name: Not on file  ? Number of children: 0  ? Years of education: Not on file  ? Highest education level: Not on file  ?Occupational History  ? Occupation: Conservation officer, nature  ?  Employer: [employer]  ?Tobacco Use  ? Smoking status: Never  ? Smokeless tobacco: Former  ?  Types: Chew  ?  Quit date: 05/01/1993  ?Vaping Use  ? Vaping Use: Never used  ?Substance and Sexual Activity  ? Alcohol use: Yes  ?  Alcohol/week: 1.0 standard drink  ?  Types: 1 Standard drinks or equivalent per week  ?  Comment: BEER AND LIQUOR  ? Drug use: No  ? Sexual activity: Not Currently  ?Other Topics Concern  ? Not on file  ?Social History Narrative  ? Exercise swimming 3-5 times/week for 45 minutes  ? ?Social Determinants of Health  ? ?Financial Resource Strain: Not on file  ?Food Insecurity: Not on file  ?Transportation Needs: Not on file  ?Physical Activity: Not on file  ?Stress: Not on file  ?Social Connections: Not on file  ? ? ?His Allergies Are:  ?Allergies  ?Allergen Reactions  ? Adhesive [Tape] Other (See Comments)  ?  Blisters and rash at site  ? Other   ?:  ? ?His Current Medications Are:  ?Outpatient Encounter  Medications as of 08/16/2021  ?Medication Sig  ? Ascorbic Acid (VITAMIN C) 1000 MG tablet Take 1,000 mg by mouth daily.  ? aspirin EC 81 MG tablet Take 81 mg by mouth daily.  ? atorvastatin (LIPITOR) 10 MG tablet Take 1 tablet (10 mg total) by mouth daily.  ? benzonatate (TESSALON) 100 MG capsule Take 1 capsule (  100 mg total) by mouth 3 (three) times daily as needed for cough.  ? Dulaglutide (TRULICITY) 4.5 QX/4.5WT SOPN Inject 4.5 mg as directed once a week.  ? ferrous sulfate 325 (65 FE) MG tablet Take 325 mg by mouth daily with breakfast.  ? fluticasone (FLONASE) 50 MCG/ACT nasal spray Place 2 sprays into both nostrils daily.  ? furosemide (LASIX) 80 MG tablet Take 1 tablet (80 mg total) by mouth 2 (two) times daily.  ? ibuprofen (ADVIL,MOTRIN) 200 MG tablet Take 600 mg by mouth every 6 (six) hours as needed for mild pain.  ? meloxicam (MOBIC) 15 MG tablet Take 15 mg by mouth daily.  ? metFORMIN (GLUCOPHAGE) 1000 MG tablet TAKE 1 TABLET BY MOUTH 2 TIMES DAILY WITH A MEAL.  ? Multiple Vitamins-Minerals (MULTIVITAMIN WITH MINERALS) tablet Take 1 tablet by mouth daily.  ? potassium chloride SA (KLOR-CON M20) 20 MEQ tablet Take 1 tablet (20 mEq total) by mouth 2 (two) times daily.  ? promethazine-dextromethorphan (PROMETHAZINE-DM) 6.25-15 MG/5ML syrup Take 2.5 mLs by mouth 3 (three) times daily as needed for cough.  ? Zinc 15 MG CAPS daily.  ? [DISCONTINUED] mupirocin ointment (BACTROBAN) 2 % Apply 1 application topically 3 (three) times daily.  ? ?No facility-administered encounter medications on file as of 08/16/2021.  ?: ? ? ?Review of Systems:  ?Out of a complete 14 point review of systems, all are reviewed and negative with the exception of these symptoms as listed below: ? ? ?Review of Systems  ?Neurological:   ?     Has cpap x 2, [facility], Dr Rowland Lathe.  GP. (Last SS 17yr.  One for travel, uses both. Ess 6, FSS 26.  Been on cpap for over 20 yrs.   ? ?Objective:  ?Neurological Exam ? ?Physical Exam ?Physical  Examination:  ? ?Vitals:  ? 08/16/21 0934  ?BP: 131/83  ?Pulse: 65  ? ? ?General Examination: The patient is a very pleasant 61y.o. male in no acute distress. He appears well-developed and well-nourished and well gr

## 2021-08-16 NOTE — Progress Notes (Deleted)
Subjective:    Patient ID: Henry Yarnell. is a 61 y.o. male.  HPI    Huston Foley, MD, PhD [facility] [address] [address] [place], [place] [address]  Dear Dr. ***,   I saw your patient, Henry Blasingame., upon your kind request in my neurologic clinic today for initial consultation of {Desc; his/her:32168} ***. The patient is unaccompanied today. As you know, the patient is a very pleasant 61 y.o.-year-old {Handed:22697}-handed {Desc; male/male:11659}, with an underlying history of ***, who presents with a {NUMBERS 6:04540981} {HH DAYS/WEEKS/MONTHS:109061} history of ***. Symptom onset was {abrupt, gradual:20671} and are now {stable, gradual worsening, rapidly worsening:3049032}. Associated symptoms include ***.   {Desc; his/her:32168} Past Medical History Is Significant For: Past Medical History:  Diagnosis Date  . Anemia   . Arthritis   . BMI 50.0-59.9, adult ([facility])   . Cellulitis 11/2015   trunk  . cll 11/2014   CLL - 11/30/2014- [facility]. [facility] [place], [place]  . DVT (deep venous thrombosis) ([facility]) 6/16   Rt calf  took xarelto for 6 months  . Family history of anesthesia complication    " father having delirium after anesthesia"  . Heart murmur    Hx; of as a child  . History of kidney stones    x2  . Joint pain    Hx: of periodically  . Numbness and tingling    Hx: of in right arm  . Pneumonia   . Sleep apnea    Cpap use- settings 10    {Desc; his/her:32168} Past Surgical History Is Significant For: Past Surgical History:  Procedure Laterality Date  . BACK SURGERY    . COLONOSCOPY     Hx: of  . COLONOSCOPY N/A 11/23/2015   Procedure: COLONOSCOPY;  Surgeon: Hilarie Fredrickson, MD;  [facility];  Service: Endoscopy;  Laterality: N/A;  . HERNIA REPAIR N/A    Phreesia 03/19/2020  . INGUINAL HERNIA REPAIR Left 06/01/2015   Procedure: LEFT INGUINAL HERNIA REPAIR WITH MESH;  Surgeon: Chevis Pretty III, MD;   [facility];  Service: General;  Laterality: Left;  . INGUINAL HERNIA REPAIR    . INSERTION OF MESH Left 06/01/2015   Procedure: INSERTION OF MESH;  Surgeon: Chevis Pretty III, MD;  [facility];  Service: General;  Laterality: Left;  . JOINT REPLACEMENT Bilateral    BTHA  . KNEE ARTHROSCOPY Left 07/21/2014   Dr. Thurston Hole  . LUMBAR LAMINECTOMY  2005  . POSTERIOR CERVICAL LAMINECTOMY WITH MET- RX Right 01/21/2013   Procedure: Right Sitting C6-7 Microdiskectomy with Met-rex;  Surgeon: Barnett Abu, MD;  [facility];  Service: Neurosurgery;  Laterality: Right;  Right Sitting C6-7 Microdiskectomy with Met-rex  . SPINE SURGERY N/A    Phreesia 03/19/2020  . STENT PLACEMENT RT URETER ([facility] HX)  10/23/2014  . tenosynovitis  2000   Hx: of thumb  . TONSILLECTOMY    . TOTAL HIP ARTHROPLASTY Bilateral   . WISDOM TOOTH EXTRACTION  1974   all 4    {Desc; his/her:32168} Family History Is Significant For: Family History  Problem Relation Age of Onset  . COPD Maternal Grandmother   . Heart disease Maternal Grandfather   . Lung cancer Paternal Grandfather        LUNG  . Diabetes Father   . Prostate cancer Father        PROSTATE    {Desc; his/her:32168} Social History Is Significant For: Social History  Socioeconomic History  . Marital status: Divorced    Spouse name: Not on file  . Number of children: 0  . Years of education: Not on file  . Highest education level: Not on file  Occupational History  . Occupation: Psychologist, counselling: [employer]  Tobacco Use  . Smoking status: Never  . Smokeless tobacco: Former    Types: Chew    Quit date: 05/01/1993  Vaping Use  . Vaping Use: Never used  Substance and Sexual Activity  . Alcohol use: Yes    Alcohol/week: 1.0 standard drink    Types: 1 Standard drinks or equivalent per week    Comment: BEER AND LIQUOR  . Drug use: No  . Sexual activity: Not Currently  Other Topics Concern  . Not on file   Social History Narrative   Exercise swimming 3-5 times/week for 45 minutes   Social Determinants of Health   Financial Resource Strain: Not on file  Food Insecurity: Not on file  Transportation Needs: Not on file  Physical Activity: Not on file  Stress: Not on file  Social Connections: Not on file    {Desc; his/her:32168} Allergies Are:  Allergies  Allergen Reactions  . Adhesive [Tape] Other (See Comments)    Blisters and rash at site  . Other   :   {Desc; his/her:32168} Current Medications Are:  Outpatient Encounter Medications as of 08/16/2021  Medication Sig  . Ascorbic Acid (VITAMIN C) 1000 MG tablet Take 1,000 mg by mouth daily.  Marland Kitchen aspirin EC 81 MG tablet Take 81 mg by mouth daily.  Marland Kitchen atorvastatin (LIPITOR) 10 MG tablet Take 1 tablet (10 mg total) by mouth daily.  . benzonatate (TESSALON) 100 MG capsule Take 1 capsule (100 mg total) by mouth 3 (three) times daily as needed for cough.  . Dulaglutide (TRULICITY) 4.5 MG/0.5ML SOPN Inject 4.5 mg as directed once a week.  . ferrous sulfate 325 (65 FE) MG tablet Take 325 mg by mouth daily with breakfast.  . fluticasone (FLONASE) 50 MCG/ACT nasal spray Place 2 sprays into both nostrils daily.  . furosemide (LASIX) 80 MG tablet Take 1 tablet (80 mg total) by mouth 2 (two) times daily.  Marland Kitchen ibuprofen (ADVIL,MOTRIN) 200 MG tablet Take 600 mg by mouth every 6 (six) hours as needed for mild pain.  . meloxicam (MOBIC) 15 MG tablet Take 15 mg by mouth daily.  . metFORMIN (GLUCOPHAGE) 1000 MG tablet TAKE 1 TABLET BY MOUTH 2 TIMES DAILY WITH A MEAL.  . Multiple Vitamins-Minerals (MULTIVITAMIN WITH MINERALS) tablet Take 1 tablet by mouth daily.  . potassium chloride SA (KLOR-CON M20) 20 MEQ tablet Take 1 tablet (20 mEq total) by mouth 2 (two) times daily.  . promethazine-dextromethorphan (PROMETHAZINE-DM) 6.25-15 MG/5ML syrup Take 2.5 mLs by mouth 3 (three) times daily as needed for cough.  . Zinc 15 MG CAPS daily.  . [DISCONTINUED]  mupirocin ointment (BACTROBAN) 2 % Apply 1 application topically 3 (three) times daily.   No facility-administered encounter medications on file as of 08/16/2021.  :   Review of Systems:  Out of a complete 14 point review of systems, all are reviewed and negative with the exception of these symptoms as listed below:  Review of Systems  Neurological:        Has been using cpap, over 20 years. Last SS done by Dr. Judi Cong GP, in [place].  Pt has 2 cpap machines (one used for travel).  Wants another for travel.  Objective:  Neurological Exam  Physical Exam Physical Examination:   Vitals:   08/16/21 0934  BP: 131/83  Pulse: 65    General Examination: The patient is a very pleasant 61 y.o. {Desc; male/male:11659} in no acute distress. {He/she (caps):30048} appears {DENTAL GENERAL CONSTITUTIONAL:21022804} and {DESC; WELL/POORLY/MARGINALLY:18601} groomed.   HEENT: Normocephalic, atraumatic, pupils are equal, round and reactive to light, extraocular tracking is good without limitation to gaze excursion or nystagmus noted. Hearing is grossly intact. Face is symmetric with normal facial animation. Speech is clear with no dysarthria noted. There is no hypophonia. There is no lip, neck/head, jaw or voice tremor. Neck is supple with full range of passive and active motion. There are no carotid bruits on auscultation. Oropharynx exam reveals: {MILD/MODERATE:22562} mouth dryness, {good,poor,adequate:3041605} dental hygiene and {DEGREE - MILD, MOD, SEV:20224} airway crowding, due to ***. Mallampati is class {Roman # I-V:19040}. Tongue protrudes centrally and palate elevates symmetrically. Tonsils are *** 1+/2+ in size/absent. Neck size is {NUMBERS 1-20:19198}.5 inches. {He/she (caps):30048} has a {(BH) RANGE ABSENT/SEVERE:20013} overbite. Nasal inspection reveals ***no significant nasal mucosal bogginess or redness and no septal deviation.   Chest: Clear to auscultation without wheezing, rhonchi or  crackles noted.  Heart: S1+S2+0, regular and normal without murmurs, rubs or gallops noted.   Abdomen: Soft, non-tender and non-distended with normal bowel sounds appreciated on auscultation.  Extremities: There is {NUMBERS; 1+ TO 4+, TRACE/RARE:14493} pitting edema in the distal lower extremities bilaterally.   Skin: Warm and dry without trophic changes noted.   Musculoskeletal: exam reveals no obvious joint deformities, tenderness or joint swelling or erythema.   Neurologically:  Mental status: The patient is awake, alert and oriented in all 4 spheres. {Desc; his/her:32168} immediate and remote memory, attention, language skills and fund of knowledge are appropriate. There is no evidence of aphasia, agnosia, apraxia or anomia. Speech is clear with normal prosody and enunciation. Thought process is linear. Mood is {mood:31886} and affect is {desc; affect:31887::"normal"}.  Cranial nerves II - XII are as described above under HEENT exam.  Motor exam: Normal bulk, strength and tone is noted. There is no tremor, Romberg is negative. Reflexes are 2+ throughout. Fine motor skills and coordination: grossly intact.  Cerebellar testing: No dysmetria or intention tremor. There is no truncal or gait ataxia.  Sensory exam: intact to light touch in the upper and lower extremities.  Gait, station and balance: {He/she (caps):30048} stands {Desc; easily/with difficulty/incompletely:30636}. No veering to one side is noted. No leaning to one side is noted. Posture is age-appropriate and stance is narrow based. Gait shows {desc; normal/decreased:14572} stride length and {desc; normal/decreased:14572} pace. No problems turning are noted. ***Tandem walk is unremarkable.                Assessment and Plan:  In summary, Jupiter Ray Rylen Tomita. is a very pleasant 61 y.o.-year old {Desc; male/male:11659} with a history and physical exam concerning for obstructive sleep apnea (OSA). I had a long chat with the patient  and *** about my findings and the diagnosis of OSA***, its prognosis and treatment options. We talked about medical treatments, surgical interventions and non-pharmacological approaches. I explained in particular the risks and ramifications of untreated moderate to severe OSA, especially with respect to developing cardiovascular disease down the Road, including congestive heart failure, difficult to treat hypertension, cardiac arrhythmias, or stroke. Even type 2 diabetes has, in part, been linked to untreated OSA. Symptoms of untreated OSA include daytime sleepiness, memory problems, mood irritability and mood disorder such as  depression and anxiety, lack of energy, as well as recurrent headaches, especially morning headaches. We talked about *** smoking cessation and trying to maintain a *** healthy lifestyle in general, as well as the importance of weight control. We also talked about the importance of good sleep hygiene***. I recommended the following at this time: sleep study ***.   I explained the sleep test procedure to the patient and also outlined possible surgical and non-surgical treatment options of OSA, including the use of a custom-made dental device (which would require a referral to a specialist dentist or oral surgeon), upper airway surgical options, such as traditional UPPP or a novel less invasive surgical option in the form of Inspire hypoglossal nerve stimulation (which would involve a referral to an ENT surgeon). I also explained the CPAP treatment option to the patient, who indicated that *** would be willing to try CPAP if the need arises. I explained the importance of being compliant with PAP treatment, not only for insurance purposes but primarily to improve {Desc; his/her:32168} symptoms, and for the patient's long term health benefit, including to reduce {Desc; his/her:32168} cardiovascular risks. I answered all *** questions today and the patient and *** {Actions; was/were:110036} in  agreement. I plan to see *** back after the sleep study is completed and encouraged *** to call with any interim questions, concerns, problems or updates.   Thank you very much for allowing me to participate in the care of this nice patient. If I can be of any further assistance to you please do not hesitate to call me at [phone].  Sincerely,   Huston Foley, MD, PhD

## 2021-08-16 NOTE — Patient Instructions (Signed)
Thank you for choosing [facility] for your sleep related care! It was nice to meet you today! I appreciate that you entrust me with your sleep related healthcare concerns. I hope, I was able to address at least some of your concerns today, and that I can help you feel reassured and also get better.    Here is what we discussed today and what we came up with as our plan for you:    Based on your symptoms and your exam I believe you are still at risk for obstructive sleep apnea and would benefit from reevaluation as it has been many years and you need new supplies and an updated machine. Therefore, I think we should proceed with a sleep study to determine how severe your sleep apnea is. If you have more than mild OSA, I want you to consider ongoing treatment with CPAP. Please remember, the risks and ramifications of moderate to severe obstructive sleep apnea or OSA are: Cardiovascular disease, including congestive heart failure, stroke, difficult to control hypertension, arrhythmias, and even type 2 diabetes has been linked to untreated OSA. Sleep apnea causes disruption of sleep and sleep deprivation in most cases, which, in turn, can cause recurrent headaches, problems with memory, mood, concentration, focus, and vigilance. Most people with untreated sleep apnea report excessive daytime sleepiness, which can affect their ability to drive. Please do not drive if you feel sleepy.   I will likely see you back after your sleep study to go over the test results and where to go from there. We will call you after your sleep study to advise about the results (most likely, you will hear from Kristen, my nurse) and to set up an appointment at the time, as necessary.    Our sleep lab administrative assistant will call you to schedule your sleep study. If you don't hear back from her by about 2 weeks from now, please feel free to call her at [phone]. You can leave a message with your phone  number and concerns, if you get the voicemail box. She will call back as soon as possible.     

## 2021-08-17 ENCOUNTER — Telehealth: Payer: Self-pay

## 2021-08-17 NOTE — Telephone Encounter (Signed)
LVM for pt to call me back to schedule sleep study  

## 2021-08-22 ENCOUNTER — Telehealth: Payer: Self-pay

## 2021-08-22 NOTE — Telephone Encounter (Signed)
LVM for pt to call me back to schedule sleep study  

## 2021-09-02 ENCOUNTER — Encounter: Payer: Self-pay | Admitting: Family Medicine

## 2021-09-02 ENCOUNTER — Ambulatory Visit: Payer: BC Managed Care – PPO | Admitting: Family Medicine

## 2021-09-02 VITALS — BP 130/68 | HR 96 | Temp 98.2°F | Resp 18 | Ht 69.0 in | Wt 351.6 lb

## 2021-09-02 DIAGNOSIS — E1165 Type 2 diabetes mellitus with hyperglycemia: Secondary | ICD-10-CM

## 2021-09-02 DIAGNOSIS — C9110 Chronic lymphocytic leukemia of B-cell type not having achieved remission: Secondary | ICD-10-CM

## 2021-09-02 DIAGNOSIS — R609 Edema, unspecified: Secondary | ICD-10-CM | POA: Diagnosis not present

## 2021-09-02 LAB — POCT GLYCOSYLATED HEMOGLOBIN (HGB A1C): Hemoglobin A1C: 8 % — AB (ref 4.0–5.6)

## 2021-09-02 MED ORDER — EMPAGLIFLOZIN 10 MG PO TABS: 10.0000 mg | ORAL_TABLET | Freq: Every day | ORAL | 1 refills | Status: DC

## 2021-09-02 NOTE — Patient Instructions (Addendum)
Continue trulicity and metformin same doses. Continue exercise, diet changes should help but based on current A1c will need to start additional med for now. Jardiance '10mg'$  per day. Recheck diabetes in 3 months. Let me know if there are questions.  ? ?[facility] ?Medical Weight Loss Management  ?[phone] ? ? ?If you have lab work done today you will be contacted with your lab results within the next 2 weeks.  If you have not heard from [place] then please contact us. The fastest way to get your results is to register for My Chart. ? ? ?IF you received an x-ray today, you will receive an invoice from [facility]. Please contact [facility] at [phone] with questions or concerns regarding your invoice.  ? ?IF you received labwork today, you will receive an invoice from [facility]. Please contact [facility] at [phone] with questions or concerns regarding your invoice.  ? ?Our billing staff will not be able to assist you with questions regarding bills from these companies. ? ?You will be contacted with the lab results as soon as they are available. The fastest way to get your results is to activate your My Chart account. Instructions are located on the last page of this paperwork. If you have not heard from [place] regarding the results in 2 weeks, please contact this office. ?  ?  ?

## 2021-09-02 NOTE — Progress Notes (Signed)
? ?Subjective:  ?Patient ID: Henry Navy., male    DOB: [birth year]  Age: 61 y.o. MRN: [id number] ? ?CC:  ?Chief Complaint  ?Patient presents with  ? Diabetes  ?  Patient states he is here for follow up on TDM and medications  ? ? ?HPI ?Henry Hinton. presents for  ? ?Diabetes: ?With hyperglycemia, obesity.  Trulicity previously increased to 4.5 mg weekly and metformin 1000 mg twice daily.  Uncontrolled last visit, recommended addition of SGLT2, but that was declined.  Planned on dietary changes. ?He is on statin with Lipitor.  Chronic lower extremity edema treated with Lasix 80 mg daily and followed by hematology for CLL. ?Increase travel last visit with decreased diet adherence, better recently. Better diet with new girlfriend.  ?Swimming for exercise - daily.  ?Home readings fasting: 122-148 ?Home readings postprandial: 148-152. No 200's.  ?No symptomatic lows.nothing below 100.  ?Interested in meeting with medical weight loss specialist.  ?Microalbumin: nl ratio last visit.  ?Optho, foot exam, pneumovax: Up-to-date, ophthalmology Dr. Prudencio Burly ?Considering retirement next year. Doing some prn work.  ? ?Wt Readings from Last 3 Encounters:  ?09/02/21 (!) 351 lb 9.6 oz (159.5 kg)  ?08/16/21 (!) 351 lb 9.6 oz (159.5 kg)  ?06/02/21 (!) 357 lb 9.6 oz (162.2 kg)  ? ? ?Lab Results  ?Component Value Date  ? HGBA1C 8.0 (A) 09/02/2021  ? HGBA1C 8.6 (H) 06/02/2021  ? HGBA1C 8.0 (H) 01/10/2021  ? ?Lab Results  ?Component Value Date  ? MICROALBUR <0.7 10/04/2020  ? [place] 92 06/02/2021  ? CREATININE 0.80 06/02/2021  ? ?Chronic lymphocytic leukemia ?Appointment with hematology, Dr. [name] noted from 06/21/2021.  Blood counts relatively stable off treatment.  RTC precautions if B symptoms.  ? ?Hyperlipidemia: ?Lipitor 10 mg QOD, recent lipid panel stable. No new myalgias with statin.  ?Lab Results  ?Component Value Date  ? CHOL 180 06/02/2021  ? HDL 60.00 06/02/2021  ? [place] 92 06/02/2021  ? TRIG 142.0 06/02/2021   ? CHOLHDL 3 06/02/2021  ? ?Lab Results  ?Component Value Date  ? ALT 37 06/02/2021  ? AST 26 06/02/2021  ? ALKPHOS 117 06/02/2021  ? BILITOT 0.5 06/02/2021  ? ? ? ? ?History ?Patient Active Problem List  ? Diagnosis Date Noted  ? Acute hypoxemic respiratory failure due to COVID-19 [employer]) 05/18/2019  ? BPH without obstruction/lower urinary tract symptoms 03/14/2018  ? Family history of prostate cancer 03/14/2018  ? History of nephrolithiasis 03/14/2018  ? Microscopic hematuria 03/14/2018  ? Diarrhea 11/20/2017  ? Diabetes mellitus without complication ([place]) 37/85/8850  ? Iron deficiency anemia 11/19/2017  ? Influenza, pneumonia 09/22/2017  ? Abdominal wall cellulitis 01/22/2017  ? Insect bite 01/22/2017  ? Hyperkalemia 01/22/2017  ? OSA (obstructive sleep apnea) 01/22/2017  ? Essential hypertension   ? Hoarseness 06/08/2016  ? Vocal cord paralysis 01/31/2016  ? Hypercalciuria 01/10/2016  ? Cellulitis 11/28/2015  ? Cellulitis of trunk 11/27/2015  ? Nonspecific abnormal finding in stool contents   ? Colon cancer screening   ? Venous stasis   ? History of DVT (deep vein thrombosis)   ? Sepsis ([place]) 10/09/2015  ? Panniculitis 10/09/2015  ? CLL (chronic lymphocytic leukemia) ([place]) 05/27/2015  ? Calculi, ureter 10/27/2014  ? Calculus of kidney 10/27/2014  ? Leukocytosis (leucocytosis) 08/29/2014  ? HNP (herniated nucleus pulposus), cervical 01/21/2013  ? bilat hip replacement 04/08/2012  ? Morbid obesity ([place]) 04/08/2012  ? Edema 04/08/2012  ? ?Past Medical History:  ?Diagnosis Date  ?  Anemia   ? Arthritis   ? BMI 50.0-59.9, adult ([place])   ? Cellulitis 11/2015  ? trunk  ? cll 11/2014  ? CLL - 11/30/2014- Dr. Ronney [facility] [place] [place]  ? DVT (deep venous thrombosis) ([place]) 6/16  ? Rt calf  took xarelto for 6 months  ? Family history of anesthesia complication   ? " father having delirium after anesthesia"  ? Heart murmur   ? Hx; of as a child  ? History of kidney stones   ? x2  ? Joint pain   ? Hx: of periodically   ? Numbness and tingling   ? Hx: of in right arm  ? Pneumonia   ? Sleep apnea   ? Cpap use- settings 10  ? ?Past Surgical History:  ?Procedure Laterality Date  ? BACK SURGERY    ? COLONOSCOPY    ? Hx: of  ? COLONOSCOPY N/A 11/23/2015  ? Procedure: COLONOSCOPY;  Surgeon: Irene Shipper, MD;  Location: [facility];  Service: Endoscopy;  Laterality: N/A;  ? HERNIA REPAIR N/A   ? [employer] 03/19/2020  ? INGUINAL HERNIA REPAIR Left 06/01/2015  ? Procedure: LEFT INGUINAL HERNIA REPAIR WITH MESH;  Surgeon: Autumn Messing III, MD;  Location: [facility];  Service: General;  Laterality: Left;  ? INGUINAL HERNIA REPAIR    ? INSERTION OF MESH Left 06/01/2015  ? Procedure: INSERTION OF MESH;  Surgeon: Autumn Messing III, MD;  Location: [facility];  Service: General;  Laterality: Left;  ? JOINT REPLACEMENT Bilateral   ? BTHA  ? KNEE ARTHROSCOPY Left 07/21/2014  ? Dr. Noemi Chapel  ? LUMBAR LAMINECTOMY  2005  ? POSTERIOR CERVICAL LAMINECTOMY WITH MET- RX Right 01/21/2013  ? Procedure: Right Sitting C6-7 Microdiskectomy with Met-rex;  Surgeon: Kristeen Miss, MD;  Location: [facility];  Service: Neurosurgery;  Laterality: Right;  Right Sitting C6-7 Microdiskectomy with Met-rex  ? SPINE SURGERY N/A   ? [employer] 03/19/2020  ? STENT PLACEMENT RT URETER ([facility] H[facility])  10/23/2014  ? tenosynovitis  2000  ? Hx: of thumb  ? TONSILLECTOMY    ? TOTAL HIP ARTHROPLASTY Bilateral   ? [place] EXTRACTION  1974  ? all 4  ? ?Allergies  ?Allergen Reactions  ? Adhesive [Tape] Other (See Comments)  ?  Blisters and rash at site  ? Other   ? ?Prior to Admission medications   ?Medication Sig Start Date End Date Taking? Authorizing Provider  ?Ascorbic Acid (VITAMIN C) 1000 MG tablet Take 1,000 mg by mouth daily.   Yes [provider]  ?aspirin EC 81 MG tablet Take 81 mg by mouth daily.   Yes [provider]  ?atorvastatin (LIPITOR) 10 MG tablet Take 1 tablet (10 mg total) by mouth daily. 06/02/21  Yes [name], MD  ?benzonatate (TESSALON) 100 MG capsule Take 1  capsule (100 mg total) by mouth 3 (three) times daily as needed for cough. 06/02/21  Yes [name], MD  ?Dulaglutide (TRULICITY) 4.5 HM/0.9OB SOPN Inject 4.5 mg as directed once a week. 06/02/21  Yes [name], MD  ?ferrous sulfate 325 (65 FE) MG tablet Take 325 mg by mouth daily with breakfast.   Yes [provider]  ?fluticasone (FLONASE) 50 MCG/ACT nasal spray Place 2 sprays into both nostrils daily. 05/19/21  Yes Perlie Mayo, NP  ?furosemide (LASIX) 80 MG tablet Take 1 tablet (80 mg total) by mouth 2 (two) times daily. 06/02/21  Yes [name], MD  ?ibuprofen (  ADVIL,MOTRIN) 200 MG tablet Take 600 mg by mouth every 6 (six) hours as needed for mild pain.   Yes [provider]  ?meloxicam (MOBIC) 15 MG tablet Take 15 mg by mouth daily. 07/20/21  Yes [provider]  ?metFORMIN (GLUCOPHAGE) 1000 MG tablet TAKE 1 TABLET BY MOUTH 2 TIMES DAILY WITH A MEAL. 06/02/21  Yes [name], MD  ?Multiple Vitamins-Minerals (MULTIVITAMIN WITH MINERALS) tablet Take 1 tablet by mouth daily.   Yes [provider]  ?potassium chloride SA (KLOR-CON M20) 20 MEQ tablet Take 1 tablet (20 mEq total) by mouth 2 (two) times daily. 06/02/21  Yes [name], MD  ?promethazine-dextromethorphan (PROMETHAZINE-DM) 6.25-15 MG/5ML syrup Take 2.5 mLs by mouth 3 (three) times daily as needed for cough. 05/19/21  Yes Perlie Mayo, NP  ?Zinc 15 MG CAPS daily. 05/22/19  Yes [provider]  ? ?Social History  ? ?Socioeconomic History  ? Marital status: Divorced  ?  Spouse name: Not on file  ? Number of children: 0  ? Years of education: Not on file  ? Highest education level: Not on file  ?Occupational History  ? Occupation: Conservation officer, nature  ?  Employer: [employer]  ?Tobacco Use  ? Smoking status: Never  ? Smokeless tobacco: Former  ?  Types: Chew  ?  Quit date: 05/01/1993  ?Vaping Use  ? Vaping Use: Never used  ?Substance and Sexual Activity  ? Alcohol use:  Yes  ?  Alcohol/week: 1.0 standard drink  ?  Types: 1 Standard drinks or equivalent per week  ?  Comment: [employer]  ? Drug use: No  ? Sexual activity: Not Currently  ?Other Topics Concern  ? N

## 2021-09-03 ENCOUNTER — Encounter: Payer: Self-pay | Admitting: Family Medicine

## 2021-09-04 ENCOUNTER — Ambulatory Visit (INDEPENDENT_AMBULATORY_CARE_PROVIDER_SITE_OTHER): Payer: BC Managed Care – PPO | Admitting: Neurology

## 2021-09-04 DIAGNOSIS — Z820 Family history of epilepsy and other diseases of the nervous system: Secondary | ICD-10-CM

## 2021-09-04 DIAGNOSIS — G4720 Circadian rhythm sleep disorder, unspecified type: Secondary | ICD-10-CM

## 2021-09-04 DIAGNOSIS — G4733 Obstructive sleep apnea (adult) (pediatric): Secondary | ICD-10-CM | POA: Diagnosis not present

## 2021-09-04 DIAGNOSIS — R634 Abnormal weight loss: Secondary | ICD-10-CM

## 2021-09-04 DIAGNOSIS — R351 Nocturia: Secondary | ICD-10-CM

## 2021-09-10 ENCOUNTER — Other Ambulatory Visit: Payer: Self-pay | Admitting: Family Medicine

## 2021-09-10 DIAGNOSIS — E1165 Type 2 diabetes mellitus with hyperglycemia: Secondary | ICD-10-CM

## 2021-09-13 ENCOUNTER — Encounter: Payer: Self-pay | Admitting: Family Medicine

## 2021-09-13 DIAGNOSIS — R0981 Nasal congestion: Secondary | ICD-10-CM

## 2021-09-13 MED ORDER — IPRATROPIUM BROMIDE 0.06 % NA SOLN: 1.0000 | Freq: Four times a day (QID) | NASAL | 5 refills | Status: DC

## 2021-09-13 NOTE — Telephone Encounter (Signed)
Pt is asking for OTC recommendation for congestion due to CPAP use  ?

## 2021-09-13 NOTE — Addendum Note (Signed)
Addended by: Star Age on: 09/13/2021 04:35 PM ? ? Modules accepted: Orders ? ?

## 2021-09-13 NOTE — Procedures (Signed)
PATIENT'S NAME:  Henry Hinton, Henry Hinton ?DOB:      [birth year]      ?MR#:    [id number]     ?DATE OF RECORDING: 09/04/2021 ?REFERRING M.D.:  [name], MD ?Study Performed:  Split-Night Titration Study ?HISTORY: 61 year old with a history of cellulitis, CLL, DVT, anemia, arthritis, history of kidney stones, status post back surgery, status post cervical laminectomy, status post ureteral stent placement, tonsillectomy, bilateral hip replacements, and morbid obesity with a BMI of over 50, who was previously diagnosed with obstructive sleep apnea and placed on positive airway pressure treatment. He presents for re-evaluation of his sleep apnea. He has an older CPAP machine. The patient endorsed the Epworth Sleepiness Scale at 6 points. The patient's weight 351 pounds with a height of 69 (inches), resulting in a BMI of 51.9 kg/m2. The patient's neck circumference measured 19 inches. ? ?CURRENT MEDICATIONS: Vitamin C, Aspirin, Lipitor, Tessalon, Trulicity, Ferrous sulfate, Flonase, Lasix, Advil, Mobic, Glucophage, Multivitamin, Klor-Con, Promethazine-DM, Zinc ?  ?PROCEDURE:  This is a multichannel digital polysomnogram utilizing the Somnostar 11.2 system.  Electrodes and sensors were applied and monitored per AASM Specifications.   EEG, EOG, Chin and Limb EMG, were sampled at 200 Hz.  ECG, Snore and Nasal Pressure, Thermal Airflow, Respiratory Effort, CPAP Flow and Pressure, Oximetry was sampled at 50 Hz. Digital video and audio were recorded.     ? ?BASELINE STUDY WITHOUT CPAP RESULTS: ? ?Lights Out was at 20:47 and Lights On at 05:00 for the night, split start at 01:58, epoch 628, due to meeting emergency split criteria.  Total recording time (TRT) was 311, with a total sleep time (TST) of 123.5 minutes.   The patient's sleep latency to persistent sleep was delayed at 32 minutes. REM sleep was absent. The sleep efficiency was 39.7 %.  ?  ?SLEEP ARCHITECTURE: WASO (Wake after sleep onset) was 171 minutes, with moderate  to severe sleep fragmentation noted. Stage N1 was 36.5 minutes, Stage N2 was 87 minutes, Stage N3 was 0 minutes and Stage R (REM sleep) was 0 minutes.  The percentages were Stage N1 29.6%, Stage N2 70.4%, both markedly increased, Stage N3 and Stage R (REM sleep) were absent.  ? ?RESPIRATORY ANALYSIS:  There were a total of 189 respiratory events:  92 obstructive apneas, 0 central apneas and 0 mixed apneas with a total of 92 apneas and an apnea index (AI) of 44.7. There were 97 hypopneas with a hypopnea index of 47.1. The patient also had 0 respiratory event related arousals (RERAs).  ?    ?The total APNEA/HYPOPNEA INDEX (AHI) was 91.8 /hour and the total RESPIRATORY DISTURBANCE INDEX was 91.8 /hour.  0 events occurred in REM sleep and 194 events in NREM. The REM AHI was n/a versus a non-REM AHI of 91.8 /hour. The patient spent 10.5 minutes sleep time in the supine position 257 minutes in non-supine. The supine AHI was 114.3 /hour versus a non-supine AHI of 89.7 /hour. ? ?OXYGEN SATURATION & C02:  The wake baseline 02 saturation was 92%, with the lowest being 82%. Time spent below 89% saturation equaled 31 minutes. ? ?PERIODIC LIMB MOVEMENTS: The patient had a total of 0 Periodic Limb Movements.  The Periodic Limb Movement (PLM) index was 0 /hour and the PLM Arousal index was 0 /hour.  ? ?Audio and video analysis did not show any abnormal or unusual movements, behaviors, phonations or vocalizations. The patient took 7 bathroom breaks, he mentioned to the technician, that he may have taken  his Lasix too late. Mild snoring was noted. The EKG was in keeping with normal sinus rhythm (NSR) ? ?TITRATION STUDY WITH CPAP RESULTS: ?  ?The patient qualified for an emergency split study due to a high AHI. The patient was fitted with a medium Switf Fx nasal pillows interface and CPAP was initiated at 6 cmH20 with heated humidity per AASM split night standards. Pressure was advanced to 14 cm, because of hypopneas, apneas and  desaturations.  At a PAP pressure of 14 cmH20, there was a reduction of the AHI to 1.6/hour with non-supine REM sleep achieved and brief O2 nadir of 87%.  ? ?Total recording time (TRT) was 182 minutes, with a total sleep time (TST) of 143.5 minutes. The patient's sleep latency was 5.5 minutes. REM latency was 123.5 minutes.  The sleep efficiency was 78.8 %.   ? ?SLEEP ARCHITECTURE: Wake after sleep was 34 minutes, Stage N1 11.5 minutes, Stage N2 100 minutes, Stage N3 12.5 minutes and Stage R (REM sleep) 19.5 minutes. The percentages were: Stage N1 8.%, Stage N2 69.7%, Stage N3 8.7% and Stage R (REM sleep) 13.6%.  ? ?The arousals were noted as: 34 were spontaneous, 0 were associated with PLMs, 11 were associated with respiratory events. ? ?RESPIRATORY ANALYSIS:  There were a total of 36 respiratory events: 0 obstructive apneas, 0 central apneas and 0 mixed apneas with a total of 0 apneas and an apnea index (AI) of 0. There were 36 hypopneas with a hypopnea index of 15.1 /hour. The patient also had 0 respiratory event related arousals (RERAs).     ? ?The total APNEA/HYPOPNEA INDEX  (AHI) was 15.1 /hour and the total RESPIRATORY DISTURBANCE INDEX was 15.1 /hour.  3 events occurred in REM sleep and 33 events in NREM. The REM AHI was 9.2 /hour versus a non-REM AHI of 16. /hour. REM sleep was achieved on a pressure of  cm/h2o (AHI was  .) The patient spent 0% of total sleep time in the supine position. The supine AHI was 0.0 /hour, versus a non-supine AHI of 15.1/hour. ? ?OXYGEN SATURATION & C02:  The wake baseline 02 saturation was 93%, with the lowest being 77%. Time spent below 89% saturation equaled 3 minutes. ? ?PERIODIC LIMB MOVEMENTS:    ?The patient had a total of 0 Periodic Limb Movements. The Periodic Limb Movement (PLM) index was 0 /hour and the PLM Arousal index was 0 /hour. ? ?Post-study, the patient indicated that sleep was worse than usual. ? ? ?POLYSOMNOGRAPHY IMPRESSION :  ? ?Severe Obstructive Sleep  Apnea (OSA)  ?Dysfunctions associated with sleep stages or arousals from sleep ? ?RECOMMENDATIONS: ? ?This patient has severe obstructive sleep apnea and qualified for an emergency split study, due to a baseline AHI of 91.8/hour and O2 nadir of 82%. The absence of REM sleep during the baseline portion of the study likely underestimates his sleep disordered breathing. He responded well to CPAP therapy. Due to a brief O2 nadir of 87% on a treatment pressure of 14 cm and non-supine REM sleep achieved, I recommend home CPAP at a pressure of 15 cm with EPR of 3 via medium Swift Fx nasal pillows with heated humidity. The patient will be reminded to be fully compliant with PAP therapy to improve sleep related symptoms and decrease long term cardiovascular risks. Please note that untreated obstructive sleep apnea may carry additional perioperative morbidity. Patients with significant obstructive sleep apnea should receive perioperative PAP therapy and the surgeons and particularly the anesthesiologist should be  informed of the diagnosis and the severity of the sleep disordered breathing. Alternative treatment options are limited, due to the severity his OSA. Concomitant weight loss is highly recommended.  ?This study shows sleep fragmentation and abnormal sleep stage percentages; these are nonspecific findings and per se do not signify an intrinsic sleep disorder or a cause for the patient's sleep-related symptoms. Causes include (but are not limited to) the first night effect of the sleep study, circadian rhythm disturbances, medication effect or an underlying mood disorder or medical problem.  ?The patient should be cautioned not to drive, work at heights, or operate dangerous or heavy equipment when tired or sleepy. Review and reiteration of good sleep hygiene measures should be pursued with any patient. ?The patient will be seen in follow-up in the sleep clinic at [facility] for discussion of the test results, symptom and  treatment compliance review, further management strategies, etc. The referring provider will be notified of the test results. ? ?I certify that I have reviewed the entire raw data recording prior to the issuance o

## 2021-09-14 ENCOUNTER — Telehealth: Payer: Self-pay | Admitting: *Deleted

## 2021-09-14 DIAGNOSIS — G4733 Obstructive sleep apnea (adult) (pediatric): Secondary | ICD-10-CM

## 2021-09-14 NOTE — Addendum Note (Signed)
Addended by: Star Age on: 09/14/2021 10:16 AM ? ? Modules accepted: Orders ? ?

## 2021-09-14 NOTE — Telephone Encounter (Signed)
-----   Message from Star Age, MD sent at 09/13/2021  4:35 PM EDT ----- ?Patient referred by Dr. Carlota Raspberry for re-eval of his OSA, seen by me on 08/16/21, patient had a split night sleep study on 09/04/21. ?Please call and notify patient that the recent sleep study showed severe obstructive sleep apnea (OSA). He did well with CPAP during the study with significant improvement of the respiratory events. I would like start the patient on a new CPAP machine for home use. I placed the order in the chart.  ?Please advise patient that we will need a follow up appointment with either myself or one of our nurse practitioners in about 2-3 months post set-up to check for how they are doing on treatment and how well it's going with the machine in general. Most insurance company require a certain compliance percentage to continue to cover/pay for the machine. Please ask patient to schedule this FU appointment, according to the set-up date, which is the day they receive the machine. Please make sure, the patient understands the importance of keeping this window for the FU appointment, as it is often an Designer, industrial/product and not our rule. Failing to adhere to this may result in losing coverage for sleep apnea treatment, at which point some insurances require repeating the whole process. Plus, monitoring compliance data is usually good feedback for the patient as far as how they are doing, how many hours they are on it, how well the mask fits, etc.  ?Also remind patient, that any PAP machine or mask issues should be first addressed with the DME company, who provided the machine/supplies.  ?Please ask if patient has a preference regarding DME company, may depend on the insurance too. ? ?Star Age, MD, PhD ?[facility])  ? ? ?

## 2021-09-14 NOTE — Telephone Encounter (Signed)
Order placed for travel PAP, may need to print for him.  ?

## 2021-09-14 NOTE — Telephone Encounter (Signed)
Spoke with patient. Ideally he would like the order for travel CPAP emailed to him at [email or web address]. If not, mail to his home. Address has been confirmed.  ?

## 2021-09-14 NOTE — Telephone Encounter (Signed)
Travel CPAP order emailed securely to patient at [email or web address]. ? ? ?

## 2021-09-14 NOTE — Telephone Encounter (Signed)
Spoke with patient and discussed his sleep study results.  Patient aware his CPAP titration study confirmed severe obstructive sleep apnea and he did well on CPAP.  Patient is amenable to proceeding with CPAP therapy at home.  He understands the insurance compliance requirements which includes using the machine at least 4 hours at night and also being seen in our office between 30 and 90 days after set up.  The patient was scheduled for an initial follow-up on July 25 at 11:30 AM arrival 11:00 with the CPAP machine and power cord.  The patient does not have a preference for DME company.  We will send him to [place].  Patient would also like a prescription for a travel machine as he plans to purchase this online out-of-pocket.  His questions were answered during the call and he verbalized appreciation.  He would like the follow-up letter sent to him via [place]. ? ?Order, sleep study results, office note, insurance information faxed to [place]. Received a receipt of confirmation. Letter sent to pt via mychart. Result forwarded to referring MD.  ?

## 2021-09-21 ENCOUNTER — Other Ambulatory Visit: Payer: Self-pay | Admitting: Family Medicine

## 2021-09-21 DIAGNOSIS — E1165 Type 2 diabetes mellitus with hyperglycemia: Secondary | ICD-10-CM

## 2021-09-21 MED ORDER — TRULICITY 4.5 MG/0.5ML ~~LOC~~ SOAJ: 4.5000 mg | SUBCUTANEOUS | 0 refills | Status: DC

## 2021-09-29 ENCOUNTER — Encounter: Payer: Self-pay | Admitting: Family Medicine

## 2021-09-29 DIAGNOSIS — L918 Other hypertrophic disorders of the skin: Secondary | ICD-10-CM

## 2021-09-29 DIAGNOSIS — E1165 Type 2 diabetes mellitus with hyperglycemia: Secondary | ICD-10-CM

## 2021-09-29 DIAGNOSIS — Z6841 Body Mass Index (BMI) 40.0 and over, adult: Secondary | ICD-10-CM

## 2021-09-30 NOTE — Telephone Encounter (Signed)
Pt requesting dietician referral as well as dermatology diatician may be appropriate given last OV but with pt also requesting Derm should he have another OV to discuss this?

## 2021-10-05 NOTE — Telephone Encounter (Signed)
Please place a referral for Dietician as I only see one for Dermatology.

## 2021-10-06 NOTE — Telephone Encounter (Signed)
Please place a referral for Dietician as I only see one for Dermatology.

## 2021-10-06 NOTE — Addendum Note (Signed)
Addended by: Patrcia Dolly on: 10/06/2021 02:05 PM   Modules accepted: Orders

## 2021-10-06 NOTE — Addendum Note (Signed)
Addended by: [name] R on: 10/06/2021 10:49 PM   Modules accepted: Orders

## 2021-10-06 NOTE — Telephone Encounter (Signed)
Not sure why it is not showing - I placed order on 6/3: "Amb ref to Medical Nutrition Therapy-MNTOrdered 10/01/2021 Normal Choose the type of MNT and number of hours: Initial MNT: 3 hours   Ordered on: 10/01/2021  Associated Dx: Type 2 diabetes mellitus with hyperglycemia, without long-term current use of insulin ([place]); BMI 50.0-59.9, adult [facility])  Authorizing provider: [name], MD    Can referrals see this order?

## 2021-10-07 NOTE — Addendum Note (Signed)
Addended by: [name] R on: 10/07/2021 09:37 AM   Modules accepted: Orders

## 2021-10-07 NOTE — Telephone Encounter (Signed)
Reordered. Is it showing up now?

## 2021-10-07 NOTE — Telephone Encounter (Signed)
Yes and I have sent it to the [place].

## 2021-10-07 NOTE — Telephone Encounter (Signed)
No referral showing for Nutrition.

## 2021-11-22 ENCOUNTER — Ambulatory Visit: Payer: BC Managed Care – PPO | Admitting: Adult Health

## 2021-11-29 NOTE — Progress Notes (Unsigned)
PATIENT: Henry Hinton. DOB: [birth year]  REASON FOR VISIT: follow up HISTORY FROM: patient PRIMARY NEUROLOGIST: Dr. Frances Furbish  Chief Complaint  Patient presents with   Follow-up    Pt in 19 pt here for CPAP follow up Pt states no questions or concerns for this office visit      HISTORY OF PRESENT ILLNESS: Today 11/30/21:  Mr. Anderer is a 61 year old male with a history of OSA on CPAP. He returns today for follow-up. DL is below. Denies any new issues. Returns today for follow-up.     HISTORY 08/16/21:  Mr. Damme is a 61 year old right-handed gentleman with an underlying medical history of cellulitis, CLL, DVT, anemia, arthritis, history of kidney stones, status post back surgery, status post cervical laminectomy, status post ureteral stent placement, tonsillectomy, bilateral hip replacements, and morbid obesity with a BMI of over 50, who was previously diagnosed with obstructive sleep apnea and placed on positive airway pressure treatment.  Prior sleep study results are not available for my review today.  His last sleep study was somewhere around 09-28-07 or Sep 27, 2009.  He has 2 different machines, uses 1 for travel and 1 for home.  He has been using nasal pillows.  He currently does not have a DME company and prior sleep study results are not available for my review today, compliance download is not possible from his machine, he has a Pharmacist, community.  His Epworth sleepiness score is 6 out of 24, fatigue severity score is 26 out of 63.  He is compliant with treatment and has benefited over time.  As he recalls, he was diagnosed with severe obstructive sleep apnea many years ago.  He reports that he has had weight fluctuation, his maximum weight was 406.  He had a tonsillectomy as a child.  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  He has a TV in his bedroom, he goes to bed between 11 and midnight and rise time is around 630.  He has no significant morning headaches and has nocturia about once per average night.  His mom also has sleep apnea.  His father passed away in 2011/09/28.  He drinks caffeine in the form of coffee, about 2 cups/day and 1 diet soda about 1 bottle per day.  He drinks alcohol about once a week.  He is a non-smoker.    REVIEW OF SYSTEMS: Out of a complete 14 system review of symptoms, the patient complains only of the following symptoms, and all other reviewed systems are negative.   ESS 7  ALLERGIES: Allergies  Allergen Reactions   Adhesive [Tape] Other (See Comments)    Blisters and rash at site   Other     HOME MEDICATIONS: Outpatient Medications Prior to Visit  Medication Sig Dispense Refill   Ascorbic Acid (VITAMIN C) 1000 MG tablet Take 1,000 mg by mouth daily.     aspirin EC 81 MG tablet Take 81 mg by mouth daily.     atorvastatin (LIPITOR) 10 MG tablet Take 1 tablet (10 mg total) by mouth daily. 90 tablet 3   benzonatate (TESSALON) 100 MG capsule Take 1 capsule (100 mg total) by mouth 3 (three) times daily as needed for cough. 20 capsule 0   Dulaglutide (TRULICITY) 4.5  MG/0.5ML SOPN Inject 4.5 mg as directed once a week. 6 mL 0   empagliflozin (JARDIANCE) 10 MG TABS tablet Take 1 tablet (10 mg total) by mouth daily before breakfast. 90 tablet 1   ferrous sulfate 325 (65 FE) MG tablet Take 325 mg by mouth daily with breakfast.     fluticasone (FLONASE) 50 MCG/ACT nasal spray Place 2 sprays into both nostrils daily. 16 g 0   furosemide (LASIX) 80 MG tablet Take 1 tablet (80 mg total) by mouth 2 (two) times daily. 180 tablet 1   ibuprofen (ADVIL,MOTRIN) 200 MG tablet Take 600 mg by mouth every 6 (six) hours as needed for mild pain.     ipratropium (ATROVENT) 0.06 % nasal spray Place 1-2 sprays into both nostrils 4 (four) times daily. As needed for nasal congestion 15 mL 5    meloxicam (MOBIC) 15 MG tablet Take 15 mg by mouth daily.     metFORMIN (GLUCOPHAGE) 1000 MG tablet TAKE 1 TABLET BY MOUTH 2 TIMES DAILY WITH A MEAL. 180 tablet 1   Multiple Vitamins-Minerals (MULTIVITAMIN WITH MINERALS) tablet Take 1 tablet by mouth daily.     potassium chloride SA (KLOR-CON M20) 20 MEQ tablet Take 1 tablet (20 mEq total) by mouth 2 (two) times daily. 180 tablet 1   promethazine-dextromethorphan (PROMETHAZINE-DM) 6.25-15 MG/5ML syrup Take 2.5 mLs by mouth 3 (three) times daily as needed for cough. 118 mL 0   Zinc 15 MG CAPS daily.     No facility-administered medications prior to visit.    PAST MEDICAL HISTORY: Past Medical History:  Diagnosis Date   Anemia    Arthritis    BMI 50.0-59.9, adult (HCC)    Cellulitis 11/2015   trunk   cll 11/2014   CLL - 11/30/2014- Dr. Riki [facility] [place] [place]   DVT (deep venous thrombosis) (HCC) 6/16   Rt calf  took xarelto for 6 months   Family history of anesthesia complication    " father having delirium after anesthesia"   Heart murmur    Hx; of as a child   History of kidney stones    x2   Joint pain    Hx: of periodically   Numbness and tingling    Hx: of in right arm   Pneumonia    Sleep apnea    Cpap use- settings 10    PAST SURGICAL HISTORY: Past Surgical History:  Procedure Laterality Date   BACK SURGERY     COLONOSCOPY     Hx: of   COLONOSCOPY N/A 11/23/2015   Procedure: COLONOSCOPY;  Surgeon: Hilarie Fredrickson, MD;  Location: [facility];  Service: Endoscopy;  Laterality: N/A;   HERNIA REPAIR N/A    [employer] 03/19/2020   INGUINAL HERNIA REPAIR Left 06/01/2015   Procedure: LEFT INGUINAL HERNIA REPAIR WITH MESH;  Surgeon: Chevis Pretty III, MD;  Location: [facility];  Service: General;  Laterality: Left;   INGUINAL HERNIA REPAIR     INSERTION OF MESH Left 06/01/2015   Procedure: INSERTION OF MESH;  Surgeon: Chevis Pretty III, MD;  Location: [facility];  Service: General;  Laterality: Left;   JOINT REPLACEMENT  Bilateral    BTHA   KNEE ARTHROSCOPY Left 07/21/2014   Dr. Thurston Hole   LUMBAR LAMINECTOMY  2005   POSTERIOR CERVICAL LAMINECTOMY WITH MET- RX Right 01/21/2013   Procedure: Right Sitting C6-7 Microdiskectomy with Met-rex;  Surgeon: Barnett Abu, MD;  Location: [facility];  Service: Neurosurgery;  Laterality: Right;  Right Sitting C6-7  Microdiskectomy with Met-rex   SPINE SURGERY N/A    [employer] 03/19/2020   STENT PLACEMENT RT URETER ([facility] HX)  10/23/2014   tenosynovitis  2000   Hx: of thumb   TONSILLECTOMY     TOTAL HIP ARTHROPLASTY Bilateral    WISDOM TOOTH EXTRACTION  1974   all 4    FAMILY HISTORY: Family History  Problem Relation Age of Onset   COPD Maternal Grandmother    Heart disease Maternal Grandfather    Lung cancer Paternal Grandfather        LUNG   Diabetes Father    Prostate cancer Father        PROSTATE    SOCIAL HISTORY: Social History   Socioeconomic History   Marital status: Divorced    Spouse name: Not on file   Number of children: 0   Years of education: Not on file   Highest education level: Not on file  Occupational History   Occupation: Dentist    Employer: [employer]  Tobacco Use   Smoking status: Never   Smokeless tobacco: Former    Types: Chew    Quit date: 05/01/1993  Vaping Use   Vaping Use: Never used  Substance and Sexual Activity   Alcohol use: Yes    Alcohol/week: 1.0 standard drink of alcohol    Types: 1 Standard drinks or equivalent per week    Comment: BE[employer] LIQUOR   Drug use: No   Sexual activity: Not Currently  Other Topics Concern   Not on file  Social History Narrative   Exercise swimming 3-5 times/week for 45 minutes   Social Determinants of Health   Financial Resource Strain: Not on file  Food Insecurity: Not on file  Transportation Needs: Not on file  Physical Activity: Not on file  Stress: Not on file  Social Connections: Not on file  Intimate Partner Violence: Not on file       PHYSICAL EXAM  Vitals:   11/30/21 1136  BP: 107/61  Pulse: 77  Weight: (!) 346 lb (156.9 kg)  Height: 5\' 9"  (1.753 m)   Body mass index is 51.1 kg/m.  Generalized: Well developed, in no acute distress  Chest: Lungs clear to auscultation bilaterally  Neurological examination  Mentation: Alert oriented to time, place, history taking. Follows all commands speech and language fluent Cranial nerve II-XII: Extraocular movements were full, visual field were full on confrontational test Head turning and shoulder shrug  were normal and symmetric. Gait and station: Gait is normal.    DIAGNOSTIC DATA (LABS, IMAGING, TESTING) - I reviewed patient records, labs, notes, testing and imaging myself where available.  Lab Results  Component Value Date   WBC 75.0 (HH) 05/21/2019   HGB 11.3 (L) 05/21/2019   HCT 37.8 (L) 05/21/2019   MCV 89.2 05/21/2019   PLT 207 05/21/2019      Component Value Date/Time   NA 142 06/02/2021 1359   NA 138 03/18/2020 0809   K 4.2 06/02/2021 1359   CL 101 06/02/2021 1359   CO2 33 (H) 06/02/2021 1359   GLUCOSE 154 (H) 06/02/2021 1359   BUN 21 06/02/2021 1359   BUN 13 03/18/2020 0809   CREATININE 0.80 06/02/2021 1359   CREATININE 0.71 02/24/2016 1603   CALCIUM 9.7 06/02/2021 1359   PROT 7.0 06/02/2021 1359   PROT 6.4 03/18/2020 0809   ALBUMIN 4.3 06/02/2021 1359   ALBUMIN 4.1 03/18/2020 0809   AST 26 06/02/2021 1359   ALT 37 06/02/2021  1359   ALKPHOS 117 06/02/2021 1359   BILITOT 0.5 06/02/2021 1359   BILITOT 0.4 03/18/2020 0809   GFRNONAA 100 03/18/2020 0809   GFRNONAA >89 02/24/2016 1603   GFRAA 116 03/18/2020 0809   GFRAA >89 02/24/2016 1603   Lab Results  Component Value Date   CHOL 180 06/02/2021   HDL 60.00 06/02/2021   LDLCALC 92 06/02/2021   TRIG 142.0 06/02/2021   CHOLHDL 3 06/02/2021   Lab Results  Component Value Date   HGBA1C 8.0 (A) 09/02/2021   Lab Results  Component Value Date   TSH 3.270 03/18/2020       ASSESSMENT AND PLAN 61 y.o. year old male  has a past medical history of Anemia, Arthritis, BMI 50.0-59.9, adult (HCC), Cellulitis (11/2015), cll (11/2014), DVT (deep venous thrombosis) (HCC) (6/16), Family history of anesthesia complication, Heart murmur, History of kidney stones, Joint pain, Numbness and tingling, Pneumonia, and Sleep apnea. here with:  OSA on CPAP  - CPAP compliance excellent - Good treatment of AHI  - Encourage patient to use CPAP nightly and > 4 hours each night - F/U in 1 year or sooner if needed    Butch Penny, MSN, NP-C 11/30/2021, 11:19 AM [facility] [address], [address] [place], [place] [address] [phone]

## 2021-11-30 ENCOUNTER — Ambulatory Visit: Payer: BC Managed Care – PPO | Admitting: Adult Health

## 2021-11-30 ENCOUNTER — Encounter: Payer: Self-pay | Admitting: Adult Health

## 2021-11-30 VITALS — BP 107/61 | HR 77 | Ht 69.0 in | Wt 346.0 lb

## 2021-11-30 DIAGNOSIS — G4733 Obstructive sleep apnea (adult) (pediatric): Secondary | ICD-10-CM

## 2021-11-30 DIAGNOSIS — Z9989 Dependence on other enabling machines and devices: Secondary | ICD-10-CM

## 2021-12-05 ENCOUNTER — Encounter: Payer: BC Managed Care – PPO | Attending: Critical Care Medicine | Admitting: Skilled Nursing Facility1

## 2021-12-05 ENCOUNTER — Encounter: Payer: Self-pay | Admitting: Skilled Nursing Facility1

## 2021-12-05 ENCOUNTER — Other Ambulatory Visit: Payer: Self-pay | Admitting: Family Medicine

## 2021-12-05 DIAGNOSIS — E669 Obesity, unspecified: Secondary | ICD-10-CM | POA: Insufficient documentation

## 2021-12-05 DIAGNOSIS — E119 Type 2 diabetes mellitus without complications: Secondary | ICD-10-CM | POA: Diagnosis present

## 2021-12-05 DIAGNOSIS — R609 Edema, unspecified: Secondary | ICD-10-CM

## 2021-12-05 NOTE — Progress Notes (Signed)
  Medical Nutrition Therapy  Appointment Start time:  2:00  Appointment End time:  3:00  Primary concerns today: to control his portion sizes and lose weight  Referral diagnosis: e11.65, e66.9   NUTRITION ASSESSMENT   Clinical Medical Hx: Anemia, arthritis, cancer, OSA, heart murmur  Medications: Trulicity Jardiance (has not started taking it due to wanting the summer to stop before taking it)  Metformin Labs: WBC 85.6, Hemoglobin 13.9, MCHC 31.6, lymphocyte absolute 78.9, monocyte absolute 1.5 Notable Signs/Symptoms: N/A  Lifestyle & Dietary Hx   Pt states he weighs himself every morning.   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   Pt states he has had COVID 3 times. Pt states she does wear his C-PAP nightly and is getting a new one having just been fitted.  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 Pt states he does swim every day and has had his hips replaced.  Pt states when he did meal prep it really worked for him. Pt states he is now living with his mom which got him out of meal prepping.   Pt states he is not interested in bariatric surgery  Dietitian educated pt on the current weight loss medications FDA approved.   Pt states he has a supportive girlfriend.   Estimated daily fluid intake:  oz Supplements:  Sleep:  Stress / self-care: fairly high using food to cope Current average weekly physical activity: Very active   24-Hr Dietary Recall: focused on behaviors today First Meal:  Snack:  Second Meal:  Snack:  Third Meal:  Snack:  Beverages:     NUTRITION INTERVENTION  Nutrition education (E-1) on the following topics:  Emotional eating Creating Balanced Meals  Handouts Provided Include  Detailed MyPlate Should I Eat Mindful Meals Needs Emotions  Learning Style & Readiness for Change Teaching method utilized: Visual & Auditory  Demonstrated degree of understanding via:  Teach Back  Barriers to learning/adherence to lifestyle change: emotional eating  Goals Established by Pt Work on creating balanced meals Start to turn inward to understand your relationship with food and how you use it   MONITORING & EVALUATION Dietary intake, weekly physical activity  Next Steps  Patient is to follow up in one month.

## 2021-12-09 ENCOUNTER — Ambulatory Visit (INDEPENDENT_AMBULATORY_CARE_PROVIDER_SITE_OTHER): Payer: BC Managed Care – PPO | Admitting: Family Medicine

## 2021-12-09 ENCOUNTER — Encounter: Payer: Self-pay | Admitting: Family Medicine

## 2021-12-09 VITALS — BP 122/60 | HR 85 | Temp 97.9°F | Resp 16 | Ht 69.0 in | Wt 344.0 lb

## 2021-12-09 DIAGNOSIS — Z9989 Dependence on other enabling machines and devices: Secondary | ICD-10-CM

## 2021-12-09 DIAGNOSIS — E1165 Type 2 diabetes mellitus with hyperglycemia: Secondary | ICD-10-CM

## 2021-12-09 DIAGNOSIS — Z6841 Body Mass Index (BMI) 40.0 and over, adult: Secondary | ICD-10-CM | POA: Diagnosis not present

## 2021-12-09 DIAGNOSIS — E785 Hyperlipidemia, unspecified: Secondary | ICD-10-CM | POA: Diagnosis not present

## 2021-12-09 DIAGNOSIS — R609 Edema, unspecified: Secondary | ICD-10-CM | POA: Diagnosis not present

## 2021-12-09 DIAGNOSIS — G4733 Obstructive sleep apnea (adult) (pediatric): Secondary | ICD-10-CM

## 2021-12-09 DIAGNOSIS — C9110 Chronic lymphocytic leukemia of B-cell type not having achieved remission: Secondary | ICD-10-CM | POA: Diagnosis not present

## 2021-12-09 DIAGNOSIS — M545 Low back pain, unspecified: Secondary | ICD-10-CM

## 2021-12-09 LAB — HEMOGLOBIN A1C: Hgb A1c MFr Bld: 8.1 % — ABNORMAL HIGH (ref 4.6–6.5)

## 2021-12-09 LAB — COMPREHENSIVE METABOLIC PANEL
ALT: 31 U/L (ref 0–53)
AST: 22 U/L (ref 0–37)
Albumin: 4.4 g/dL (ref 3.5–5.2)
Alkaline Phosphatase: 105 U/L (ref 39–117)
BUN: 20 mg/dL (ref 6–23)
CO2: 30 mEq/L (ref 19–32)
Calcium: 8.7 mg/dL (ref 8.4–10.5)
Chloride: 101 mEq/L (ref 96–112)
Creatinine, Ser: 0.81 mg/dL (ref 0.40–1.50)
GFR: 95.2 mL/min (ref >60.00)
Glucose, Bld: 144 mg/dL — ABNORMAL HIGH (ref 70–99)
Potassium: 4.1 mEq/L (ref 3.5–5.1)
Sodium: 140 mEq/L (ref 135–145)
Total Bilirubin: 0.7 mg/dL (ref 0.2–1.2)
Total Protein: 7.1 g/dL (ref 6.0–8.3)

## 2021-12-09 LAB — MICROALBUMIN / CREATININE URINE RATIO
Creatinine,U: 174.3 mg/dL
Microalb Creat Ratio: 0.7 mg/g (ref 0.0–30.0)
Microalb, Ur: 1.2 mg/dL (ref 0.0–1.9)

## 2021-12-09 LAB — LIPID PANEL
Cholesterol: 163 mg/dL (ref 0–200)
HDL: 49.1 mg/dL (ref >39.00)
LDL Cholesterol: 87 mg/dL (ref 0–99)
NonHDL: 113.9
Total CHOL/HDL Ratio: 3
Triglycerides: 134 mg/dL (ref 0.0–149.0)
VLDL: 26.8 mg/dL (ref 0.0–40.0)

## 2021-12-09 MED ORDER — TRULICITY 4.5 MG/0.5ML ~~LOC~~ SOAJ: 4.5000 mg | SUBCUTANEOUS | 0 refills | Status: DC

## 2021-12-09 MED ORDER — EMPAGLIFLOZIN 10 MG PO TABS: 10.0000 mg | ORAL_TABLET | Freq: Every day | ORAL | 1 refills | Status: DC

## 2021-12-09 MED ORDER — METFORMIN HCL 1000 MG PO TABS: ORAL_TABLET | ORAL | 1 refills | Status: DC

## 2021-12-09 NOTE — Patient Instructions (Addendum)
No med changes for now. If readings remain elevated, let me know as we can increase jardiance.

## 2021-12-09 NOTE — Progress Notes (Signed)
Subjective:  Patient ID: Henry Navy., male    DOB: Aug 30, 1960  Age: 61 y.o. MRN: [id number]  CC:  Chief Complaint  Patient presents with   Hypertension   Diabetes   Hyperlipidemia    HPI [facility] Henry Hinton. presents for   Diabetes: With hyperglycemia, obesity.  Now under the care of nutritionist. Jardiance 10 mg added for elevated A1c in May, continued on Trulicity 4.5 mg weekly, metformin 1000 mg twice daily.  He is on statin with Lipitor.  Chronic, extremity edema treated with Lasix 80 mg daily and followed by hematology for chronic lymphocytic leukemia. Just started jardiance few weeks ago - no mycotic or uti sx's. No med missed doses.  Swimming 4-5 days per week - weight 339 at home, from high of 346.  Home readings fasting: 148  Home readings postprandial 162 Symptomatic lows: none. Lowest 130's.  Microalbumin: Normal ratio 10/04/2020 Optho, foot exam, pneumovax: Up-to-date Some soreness in back with moving coolers this summer. No other new myalgias with statin.   Wt Readings from Last 3 Encounters:  12/09/21 (!) 344 lb (156 kg)  12/05/21 (!) 346 lb 3.2 oz (157 kg)  11/30/21 (!) 346 lb (156.9 kg)    Lab Results  Component Value Date   HGBA1C 8.0 (A) 09/02/2021   HGBA1C 8.6 (H) 06/02/2021   HGBA1C 8.0 (H) 01/10/2021   Lab Results  Component Value Date   MICROALBUR <0.7 10/04/2020   LDLCALC 92 06/02/2021   CREATININE 0.80 06/02/2021   OSA on CPAP Followed by [place] [facility], appointment August 2.  Download indicated 77% usage of CPAP.  Average 7 hours usage, stable with good treatment of AHI 1 year follow-up planned.  CLL Followed by Dr. [name].  Last appointment June 7. No changes in plan. No new meds.   Under care of dermatology for precancerous lesion on lower abdominal wall and right ear.  history Patient Active Problem List   Diagnosis Date Noted   Acute hypoxemic respiratory failure due to COVID-19 [facility]) 05/18/2019   BPH without  obstruction/lower urinary tract symptoms 03/14/2018   Family history of prostate cancer 03/14/2018   History of nephrolithiasis 03/14/2018   Microscopic hematuria 03/14/2018   Diarrhea 11/20/2017   Diabetes mellitus without complication ([place]) 28/78/6767   Iron deficiency anemia 11/19/2017   Influenza, pneumonia 09/22/2017   Abdominal wall cellulitis 01/22/2017   Insect bite 01/22/2017   Hyperkalemia 01/22/2017   OSA (obstructive sleep apnea) 01/22/2017   Essential hypertension    Hoarseness 06/08/2016   Vocal cord paralysis 01/31/2016   Hypercalciuria 01/10/2016   Cellulitis 11/28/2015   Cellulitis of trunk 11/27/2015   Nonspecific abnormal finding in stool contents    Colon cancer screening    Venous stasis    History of DVT (deep vein thrombosis)    Sepsis ([place]) 10/09/2015   Panniculitis 10/09/2015   CLL (chronic lymphocytic leukemia) ([place]) 05/27/2015   Calculi, ureter 10/27/2014   Calculus of kidney 10/27/2014   Leukocytosis (leucocytosis) 08/29/2014   HNP (herniated nucleus pulposus), cervical 01/21/2013   bilat hip replacement 04/08/2012   Morbid obesity ([place]) 04/08/2012   Edema 04/08/2012   Past Medical History:  Diagnosis Date   Anemia    Arthritis    BMI 50.0-59.9, adult ([place])    Cellulitis 11/2015   trunk   cll 11/2014   CLL - 11/30/2014- Dr. [facility] [place], [place]   DVT (deep venous thrombosis) ([place]) 6/16   Rt calf  took xarelto for 6  months   Family history of anesthesia complication    " father having delirium after anesthesia"   Heart murmur    Hx; of as a child   History of kidney stones    x2   Joint pain    Hx: of periodically   Numbness and tingling    Hx: of in right arm   Pneumonia    Sleep apnea    Cpap use- settings 10   Past Surgical History:  Procedure Laterality Date   BACK SURGERY     COLONOSCOPY     Hx: of   COLONOSCOPY N/A 11/23/2015   Procedure: COLONOSCOPY;  Surgeon: Irene Shipper, MD;  Location: [facility];   Service: Endoscopy;  Laterality: N/A;   HERNIA REPAIR N/A    [employer]rees[employer] 03/19/2020   INGUINAL HERNIA REPAIR Left 06/01/2015   Procedure: LEFT INGUINAL HERNIA REPAIR WITH MESH;  Surgeon: Autumn Messing III, MD;  Location: [facility] ORS;  Service: General;  Laterality: Left;   INGUINAL HERNIA REPAIR     INSERTION OF MESH Left 06/01/2015   Procedure: INSERTION OF MESH;  Surgeon: Autumn Messing III, MD;  Location: [facility] ORS;  Service: General;  Laterality: Left;   JOINT REPLACEMENT Bilateral    BTHA   KNEE ARTHROSCOPY Left 07/21/2014   Dr. Noemi Chapel   LUMBAR LAMINECTOMY  2005   POSTERIOR CERVICAL LAMINECTOMY WITH MET- RX Right 01/21/2013   Procedure: Right Sitting C6-7 Microdiskectomy with Met-rex;  Surgeon: Kristeen Miss, MD;  Location: [place] ORS;  Service: Neurosurgery;  Laterality: Right;  Right Sitting C6-7 Microdiskectomy with Met-rex   SPINE SURGERY N/A    Phreesia 03/19/2020   STENT PLACEMENT RT URETER ([facility] HX)  10/23/2014   tenosynovitis  2000   Hx: of thumb   TONSILLECTOMY     TOTAL HIP ARTHROPLASTY Bilateral    WISDOM TOOTH EXTRACTION  1974   all 4   Allergies  Allergen Reactions   Adhesive [Tape] Other (See Comments)    Blisters and rash at site   Other    Prior to Admission medications   Medication Sig Start Date End Date Taking? Authorizing Provider  Ascorbic Acid (VITAMIN C) 1000 MG tablet Take 1,000 mg by mouth daily.    [provider]  aspirin EC 81 MG tablet Take 81 mg by mouth daily.    [provider]  atorvastatin (LIPITOR) 10 MG tablet Take 1 tablet (10 mg total) by mouth daily. 06/02/21   [name], MD  benzonatate (TESSALON) 100 MG capsule Take 1 capsule (100 mg total) by mouth 3 (three) times daily as needed for cough. 06/02/21   [name], MD  Dulaglutide (TRULICITY) 4.5 PY/1.9JK SOPN Inject 4.5 mg as directed once a week. 09/21/21   [name], MD  empagliflozin (JARDIANCE) 10 MG TABS tablet Take 1 tablet (10 mg total) by mouth daily before  breakfast. 09/02/21   [name], MD  ferrous sulfate 325 (65 FE) MG tablet Take 325 mg by mouth daily with breakfast.    [provider]  fluticasone (FLONASE) 50 MCG/ACT nasal spray Place 2 sprays into both nostrils daily. 05/19/21   Perlie Mayo, NP  furosemide (LASIX) 80 MG tablet Take 1 tablet (80 mg total) by mouth 2 (two) times daily. 06/02/21   [name], MD  ibuprofen (ADVIL,MOTRIN) 200 MG tablet Take 600 mg by mouth every 6 (six) hours as needed for mild pain.    [provider]  ipratropium (ATROVENT)  0.06 % nasal spray Place 1-2 sprays into both nostrils 4 (four) times daily. As needed for nasal congestion 09/13/21   [name], MD  KLOR-CON M20 20 MEQ tablet TAKE 1 TABLET BY MOUTH TWICE A DAY 12/05/21   [name], MD  meloxicam (MOBIC) 15 MG tablet Take 15 mg by mouth daily. 07/20/21   [provider]  metFORMIN (GLUCOPHAGE) 1000 MG tablet TAKE 1 TABLET BY MOUTH 2 TIMES DAILY WITH A MEAL. 06/02/21   [name], MD  Multiple Vitamins-Minerals (MULTIVITAMIN WITH MINERALS) tablet Take 1 tablet by mouth daily.    [provider]  promethazine-dextromethorphan (PROMETHAZINE-DM) 6.25-15 MG/5ML syrup Take 2.5 mLs by mouth 3 (three) times daily as needed for cough. 05/19/21   Perlie Mayo, NP  Zinc 15 MG CAPS daily. 05/22/19   [provider]   Social History   Socioeconomic History   Marital status: Divorced    Spouse name: Not on file   Number of children: 0   Years of education: Not on file   Highest education level: Not on file  Occupational History   Occupation: Conservation officer, nature    Employer: [employer]  Tobacco Use   Smoking status: Never   Smokeless tobacco: Former    Types: Chew    Quit date: 05/01/1993  Vaping Use   Vaping Use: Never used  Substance and Sexual Activity   Alcohol use: Yes    Alcohol/week: 8.0 standard drinks of alcohol    Types: 4 Cans of beer, 4 Shots of liquor  per week    Comment: BEER AND LIQUOR   Drug use: No   Sexual activity: Not Currently  Other Topics Concern   Not on file  Social History Narrative   Exercise swimming 3-5 times/week for 45 minutes   Social Determinants of Health   Financial Resource Strain: Not on file  Food Insecurity: Not on file  Transportation Needs: Not on file  Physical Activity: Not on file  Stress: Not on file  Social Connections: Not on file  Intimate Partner Violence: Not on file    Review of Systems  Constitutional:  Negative for fatigue and unexpected weight change.  Eyes:  Negative for visual disturbance.  Respiratory:  Negative for cough, chest tightness and shortness of breath.   Cardiovascular:  Negative for chest pain, palpitations and leg swelling.  Gastrointestinal:  Negative for abdominal pain and blood in stool.  Musculoskeletal:  Positive for arthralgias (R low back.).  Neurological:  Negative for dizziness, light-headedness and headaches.     Objective:   Vitals:   12/09/21 0845  BP: 122/60  Pulse: 85  Resp: 16  Temp: 97.9 F (36.6 C)  TempSrc: Oral  SpO2: 94%  Weight: (!) 344 lb (156 kg)  Height: _0  (1.753 m)     Physical Exam Vitals reviewed.  Constitutional:      Appearance: He is well-developed.  HENT:     Head: Normocephalic and atraumatic.  Neck:     Vascular: No carotid bruit or JVD.  Cardiovascular:     Rate and Rhythm: Normal rate and regular rhythm.     Heart sounds: Normal heart sounds. No murmur heard. Pulmonary:     Effort: Pulmonary effort is normal.     Breath sounds: Normal breath sounds. No rales.  Musculoskeletal:     Right lower leg: No edema.     Left lower leg: No edema.     Comments: R low back with  reproducible discomfort over R paraspinals. No midline bony ttp.   Skin:    General: Skin is warm and dry.  Neurological:     Mental Status: He is alert and oriented to person, place, and time.  Psychiatric:        Mood and Affect: Mood  normal.        Assessment & Plan:  Kashawn Dirr. is a 61 y.o. male . Type 2 diabetes mellitus with hyperglycemia, without long-term current use of insulin (HCC) - Plan: Comprehensive metabolic panel, Hemoglobin A1c, Microalbumin / creatinine urine ratio, Dulaglutide (TRULICITY) 4.5 YI/0.1KP SOPN, empagliflozin (JARDIANCE) 10 MG TABS tablet, metFORMIN (GLUCOPHAGE) 1000 MG tablet BMI 50.0-59.9, adult (HCC)  -Still some elevated readings but recently started Jardiance.  Will continue same regimen for now, if persistent elevated home readings recommend increasing Jardiance to 25 mg.  Watch for side effects.  Continue Trulicity, metformin same doses for now.  Labs pending.  -Commended on weight loss, exercise.  Continue follow-up with nutritionist.  CLL (chronic lymphocytic leukemia) ([place])  -Ongoing follow-up with hematology, recent follow-up without new medications/treatments.  Peripheral edema  -Improved, continue furosemide.  Hyperlipidemia, unspecified hyperlipidemia type - Plan: Comprehensive metabolic panel, Lipid panel  -Tolerating statin, right low back likely strain, not myalgia from statin.  RTC precautions.  Check labs.  OSA on CPAP  -Stable, consistent use.  Recent specialist visit noted.  Continue CPAP.  Acute right-sided low back pain without sciatica  -Overuse, strain likely, no red flags on exam.  Continue symptomatic care with RTC precautions.  Meds ordered this encounter  Medications   Dulaglutide (TRULICITY) 4.5 VV/7.4MO SOPN    Sig: Inject 4.5 mg as directed once a week.    Dispense:  6 mL    Refill:  0    DX Code Needed  .   empagliflozin (JARDIANCE) 10 MG TABS tablet    Sig: Take 1 tablet (10 mg total) by mouth daily before breakfast.    Dispense:  90 tablet    Refill:  1   metFORMIN (GLUCOPHAGE) 1000 MG tablet    Sig: TAKE 1 TABLET BY MOUTH 2 TIMES DAILY WITH A MEAL.    Dispense:  180 tablet    Refill:  1   Patient Instructions  No med changes  for now. If readings remain elevated, let me know as we can increase jardiance.       Signed,   [name], MD [place] 12/09/21 9:19 AM

## 2021-12-14 ENCOUNTER — Ambulatory Visit: Payer: BC Managed Care – PPO | Admitting: Skilled Nursing Facility1

## 2022-01-17 ENCOUNTER — Encounter: Payer: Self-pay | Admitting: Family Medicine

## 2022-01-18 NOTE — Telephone Encounter (Signed)
Patient sent mychart with 2 concerns.  Pt notes stopped Jardiance due to increased cramping depite drinking suficcient water. Willing to try again after the fall  Pt is requesting you fill abx required prior to his dental visits. Notes amoxacillin four(4) '500mg'$  (2,'000mg'$  total) tablets 1 hour prior to visit.

## 2022-02-01 ENCOUNTER — Ambulatory Visit: Payer: BC Managed Care – PPO | Admitting: Skilled Nursing Facility1

## 2022-02-23 ENCOUNTER — Telehealth: Payer: BC Managed Care – PPO | Admitting: Family Medicine

## 2022-02-23 DIAGNOSIS — J069 Acute upper respiratory infection, unspecified: Secondary | ICD-10-CM

## 2022-02-23 MED ORDER — BENZONATATE 100 MG PO CAPS: 200.0000 mg | ORAL_CAPSULE | Freq: Three times a day (TID) | ORAL | 0 refills | Status: DC | PRN

## 2022-02-23 MED ORDER — PSEUDOEPH-BROMPHEN-DM 30-2-10 MG/5ML PO SYRP: 2.5000 mL | ORAL_SOLUTION | Freq: Four times a day (QID) | ORAL | 0 refills | Status: DC | PRN

## 2022-02-23 NOTE — Patient Instructions (Addendum)
Henry Ray Alyson Reedy., thank you for joining Perlie Mayo, NP for today's virtual visit.  While this provider is not your primary care provider (PCP), if your PCP is located in our provider database this encounter information will be shared with them immediately following your visit.   [facility] account gives you access to today's visit and all your visits, tests, and labs performed at [facility] " click here if you don't have a [facility] account or go to [email or web address]  Consent: (Patient) Henry Ray Alyson Reedy. provided verbal consent for this virtual visit at the beginning of the encounter.  Current Medications:  Current Outpatient Medications:    benzonatate (TESSALON) 100 MG capsule, Take 2 capsules (200 mg total) by mouth 3 (three) times daily as needed for cough., Disp: 30 capsule, Rfl: 0   brompheniramine-pseudoephedrine-DM 30-2-10 MG/5ML syrup, Take 2.5 mLs by mouth 4 (four) times daily as needed., Disp: 120 mL, Rfl: 0   Ascorbic Acid (VITAMIN C) 1000 MG tablet, Take 1,000 mg by mouth daily., Disp: , Rfl:    aspirin EC 81 MG tablet, Take 81 mg by mouth daily., Disp: , Rfl:    atorvastatin (LIPITOR) 10 MG tablet, Take 1 tablet (10 mg total) by mouth daily., Disp: 90 tablet, Rfl: 3   Dulaglutide (TRULICITY) 4.5 ZJ/6.9CV SOPN, Inject 4.5 mg as directed once a week., Disp: 6 mL, Rfl: 0   empagliflozin (JARDIANCE) 10 MG TABS tablet, Take 1 tablet (10 mg total) by mouth daily before breakfast., Disp: 90 tablet, Rfl: 1   ferrous sulfate 325 (65 FE) MG tablet, Take 325 mg by mouth daily with breakfast., Disp: , Rfl:    fluticasone (FLONASE) 50 MCG/ACT nasal spray, Place 2 sprays into both nostrils daily., Disp: 16 g, Rfl: 0   furosemide (LASIX) 80 MG tablet, Take 1 tablet (80 mg total) by mouth 2 (two) times daily., Disp: 180 tablet, Rfl: 1   ibuprofen (ADVIL,MOTRIN) 200 MG tablet, Take 600 mg by mouth every 6 (six) hours as needed for mild pain.,  Disp: , Rfl:    ipratropium (ATROVENT) 0.06 % nasal spray, Place 1-2 sprays into both nostrils 4 (four) times daily. As needed for nasal congestion, Disp: 15 mL, Rfl: 5   KLOR-CON M20 20 MEQ tablet, TAKE 1 TABLET BY MOUTH TWICE A DAY, Disp: 180 tablet, Rfl: 1   meloxicam (MOBIC) 15 MG tablet, Take 15 mg by mouth daily., Disp: , Rfl:    metFORMIN (GLUCOPHAGE) 1000 MG tablet, TAKE 1 TABLET BY MOUTH 2 TIMES DAILY WITH A MEAL., Disp: 180 tablet, Rfl: 1   Multiple Vitamins-Minerals (MULTIVITAMIN WITH MINERALS) tablet, Take 1 tablet by mouth daily., Disp: , Rfl:    Zinc 15 MG CAPS, daily., Disp: , Rfl:    Medications ordered in this encounter:  Meds ordered this encounter  Medications   benzonatate (TESSALON) 100 MG capsule    Sig: Take 2 capsules (200 mg total) by mouth 3 (three) times daily as needed for cough.    Dispense:  30 capsule    Refill:  0    Order Specific Question:   Supervising Provider    Answer:   Chase Picket [id number]   brompheniramine-pseudoephedrine-DM 30-2-10 MG/5ML syrup    Sig: Take 2.5 mLs by mouth 4 (four) times daily as needed.    Dispense:  120 mL    Refill:  0    Order Specific Question:   Supervising Provider    Answer:   Lanny Cramp,  Myrene Galas [[id number]]     *If you need refills on other medications prior to your next appointment, please contact your pharmacy*  Follow-Up: Call back or seek an in-person evaluation if the symptoms worsen or if the condition fails to improve as anticipated.  [facility] [phone]  Other Instructions  -Take meds as prescribed -Rest -Use a cool mist humidifier especially during the winter months when heat dries out the air. - Use saline nose sprays frequently to help soothe nasal passages and promote drainage. -Flonase daily  -Saline irrigations of the nose can be very helpful if done frequently.             * 4X daily for 1 week*             * Use of a nettie pot can be helpful with this.  *Follow  directions with this* *Boiled or distilled water only -stay hydrated by drinking plenty of fluids - Use plain Mucinex - Keep thermostat turn down low to prevent drying out sinuses - For fever or aches or pains- take tylenol or ibuprofen as directed on bottle       If you have been instructed to have an in-person evaluation today at a local Urgent Care facility, please use the link below. It will take you to a list of all of our available [facility], including address, phone number and hours of operation. Please do not delay care.  [facility]  If you or a family member do not have a primary care provider, use the link below to schedule a visit and establish care. When you choose a [facility] primary care physician or advanced practice provider, you gain a long-term partner in health. Find a Primary Care Provider  Learn more about 's in-office and virtual care options: [facility]

## 2022-02-23 NOTE — Progress Notes (Signed)
Virtual Visit Consent   Henry Ray Oluwatimileyin Vivier., you are scheduled for a virtual visit with a [facility] provider today. Just as with appointments in the office, your consent must be obtained to participate. Your consent will be active for this visit and any virtual visit you may have with one of our providers in the next 365 days. If you have a MyChart account, a copy of this consent can be sent to you electronically.  As this is a virtual visit, video technology does not allow for your provider to perform a traditional examination. This may limit your provider's ability to fully assess your condition. If your provider identifies any concerns that need to be evaluated in person or the need to arrange testing (such as labs, EKG, etc.), we will make arrangements to do so. Although advances in technology are sophisticated, we cannot ensure that it will always work on either your end or our end. If the connection with a video visit is poor, the visit may have to be switched to a telephone visit. With either a video or telephone visit, we are not always able to ensure that we have a secure connection.  By engaging in this virtual visit, you consent to the provision of healthcare and authorize for your insurance to be billed (if applicable) for the services provided during this visit. Depending on your insurance coverage, you may receive a charge related to this service.  I need to obtain your verbal consent now. Are you willing to proceed with your visit today? Henry Ray Jakell Trusty. has provided verbal consent on 02/23/2022 for a virtual visit (video or telephone). Perlie Mayo, NP  Date: 02/23/2022 11:46 AM  Virtual Visit via Video Note   I, Perlie Mayo, connected with  Henry Ran Jishnu Jenniges.  ([id number], 04-29-1961) on 02/23/22 at 11:45 AM EDT by a video-enabled telemedicine application and verified that I am speaking with the correct person using two identifiers.  Location: Patient: Virtual Visit  Location Patient: Home Provider: Virtual Visit Location Provider: Home Office   I discussed the limitations of evaluation and management by telemedicine and the availability of in person appointments. The patient expressed understanding and agreed to proceed.    History of Present Illness: Henry Ray Dossie Swor. is a 61 y.o. who identifies as a male who was assigned male at birth, and is being seen today for cough and cold symptoms.  Started on Tuesday with cough, and sore throat, has nasal congestion and drainage mild era pain, sneezing and a headache. COVID was negative on a home test. Denies chest pain shortness of breath. Reports DM is controlled and is taking his medication has directed.    Problems:  Patient Active Problem List   Diagnosis Date Noted   Acute hypoxemic respiratory failure due to COVID-19 [facility]) 05/18/2019   BPH without obstruction/lower urinary tract symptoms 03/14/2018   Family history of prostate cancer 03/14/2018   History of nephrolithiasis 03/14/2018   Microscopic hematuria 03/14/2018   Diarrhea 11/20/2017   Diabetes mellitus without complication ([place]) 37/16/9678   Iron deficiency anemia 11/19/2017   Influenza, pneumonia 09/22/2017   Abdominal wall cellulitis 01/22/2017   Insect bite 01/22/2017   Hyperkalemia 01/22/2017   OSA (obstructive sleep apnea) 01/22/2017   Essential hypertension    Hoarseness 06/08/2016   Vocal cord paralysis 01/31/2016   Hypercalciuria 01/10/2016   Cellulitis 11/28/2015   Cellulitis of trunk 11/27/2015   Nonspecific abnormal finding in stool contents    Colon cancer  screening    Venous stasis    History of DVT (deep vein thrombosis)    Sepsis ([place]) 10/09/2015   Panniculitis 10/09/2015   CLL (chronic lymphocytic leukemia) ([place]) 05/27/2015   Calculi, ureter 10/27/2014   Calculus of kidney 10/27/2014   Leukocytosis (leucocytosis) 08/29/2014   HNP (herniated nucleus pulposus), cervical 01/21/2013   bilat hip replacement  04/08/2012   Morbid obesity ([place]) 04/08/2012   Edema 04/08/2012    Allergies:  Allergies  Allergen Reactions   Adhesive [Tape] Other (See Comments)    Blisters and rash at site   Other    Medications:  Current Outpatient Medications:    Ascorbic Acid (VITAMIN C) 1000 MG tablet, Take 1,000 mg by mouth daily., Disp: , Rfl:    aspirin EC 81 MG tablet, Take 81 mg by mouth daily., Disp: , Rfl:    atorvastatin (LIPITOR) 10 MG tablet, Take 1 tablet (10 mg total) by mouth daily., Disp: 90 tablet, Rfl: 3   Dulaglutide (TRULICITY) 4.5 KG/4.0NU SOPN, Inject 4.5 mg as directed once a week., Disp: 6 mL, Rfl: 0   empagliflozin (JARDIANCE) 10 MG TABS tablet, Take 1 tablet (10 mg total) by mouth daily before breakfast., Disp: 90 tablet, Rfl: 1   ferrous sulfate 325 (65 FE) MG tablet, Take 325 mg by mouth daily with breakfast., Disp: , Rfl:    fluticasone (FLONASE) 50 MCG/ACT nasal spray, Place 2 sprays into both nostrils daily., Disp: 16 g, Rfl: 0   furosemide (LASIX) 80 MG tablet, Take 1 tablet (80 mg total) by mouth 2 (two) times daily., Disp: 180 tablet, Rfl: 1   ibuprofen (ADVIL,MOTRIN) 200 MG tablet, Take 600 mg by mouth every 6 (six) hours as needed for mild pain., Disp: , Rfl:    ipratropium (ATROVENT) 0.06 % nasal spray, Place 1-2 sprays into both nostrils 4 (four) times daily. As needed for nasal congestion, Disp: 15 mL, Rfl: 5   KLOR-CON M20 20 MEQ tablet, TAKE 1 TABLET BY MOUTH TWICE A DAY, Disp: 180 tablet, Rfl: 1   meloxicam (MOBIC) 15 MG tablet, Take 15 mg by mouth daily., Disp: , Rfl:    metFORMIN (GLUCOPHAGE) 1000 MG tablet, TAKE 1 TABLET BY MOUTH 2 TIMES DAILY WITH A MEAL., Disp: 180 tablet, Rfl: 1   Multiple Vitamins-Minerals (MULTIVITAMIN WITH MINERALS) tablet, Take 1 tablet by mouth daily., Disp: , Rfl:    promethazine-dextromethorphan (PROMETHAZINE-DM) 6.25-15 MG/5ML syrup, Take 2.5 mLs by mouth 3 (three) times daily as needed for cough., Disp: 118 mL, Rfl: 0   Zinc 15 MG CAPS,  daily., Disp: , Rfl:   Observations/Objective: Patient is well-developed, well-nourished in no acute distress.  Resting comfortably  at home.  Head is normocephalic, atraumatic.  No labored breathing.  Speech is clear and coherent with logical content.  Patient is alert and oriented at baseline.    Assessment and Plan:  1. Viral URI with cough  - benzonatate (TESSALON) 100 MG capsule; Take 2 capsules (200 mg total) by mouth 3 (three) times daily as needed for cough.  Dispense: 30 capsule; Refill: 0 - brompheniramine-pseudoephedrine-DM 30-2-10 MG/5ML syrup; Take 2.5 mLs by mouth 4 (four) times daily as needed.  Dispense: 120 mL; Refill: 0    -Take meds as prescribed -Rest -Use a cool mist humidifier especially during the winter months when heat dries out the air. - Use saline nose sprays frequently to help soothe nasal passages and promote drainage. -Saline irrigations of the nose can be very helpful if done frequently.             *  4X daily for 1 week*             * Use of a nettie pot can be helpful with this.  *Follow directions with this* *Boiled or distilled water only -stay hydrated by drinking plenty of fluids - Use plain Mucinex - Keep thermostat turn down low to prevent drying out sinuses - For fever or aches or pains- take tylenol or ibuprofen as directed on bottle       If you do not improve you will need a follow up visit in person.               Reviewed side effects, risks and benefits of medication.    Patient acknowledged agreement and understanding of the plan.   Past Medical, Surgical, Social History, Allergies, and Medications have been Reviewed    Follow Up Instructions: I discussed the assessment and treatment plan with the patient. The patient was provided an opportunity to ask questions and all were answered. The patient agreed with the plan and demonstrated an understanding of the instructions.  A copy of instructions were sent to the patient via  MyChart unless otherwise noted below.     The patient was advised to call back or seek an in-person evaluation if the symptoms worsen or if the condition fails to improve as anticipated.  Time:  I spent 10 minutes with the patient via telehealth technology discussing the above problems/concerns.    Perlie Mayo, NP

## 2022-02-24 ENCOUNTER — Encounter: Payer: Self-pay | Admitting: Family Medicine

## 2022-03-03 ENCOUNTER — Encounter: Payer: Self-pay | Admitting: Family Medicine

## 2022-03-03 NOTE — Telephone Encounter (Signed)
Patient is asking about mounjaro

## 2022-03-03 NOTE — Telephone Encounter (Signed)
Patient would like to proceed

## 2022-03-04 ENCOUNTER — Other Ambulatory Visit: Payer: Self-pay | Admitting: Family Medicine

## 2022-03-04 MED ORDER — TIRZEPATIDE 5 MG/0.5ML ~~LOC~~ SOAJ: 5.0000 mg | SUBCUTANEOUS | 0 refills | Status: DC

## 2022-03-04 NOTE — Telephone Encounter (Signed)
See mychart messaging

## 2022-03-06 ENCOUNTER — Telehealth: Payer: BC Managed Care – PPO | Admitting: Physician Assistant

## 2022-03-06 ENCOUNTER — Encounter: Payer: Self-pay | Admitting: Family Medicine

## 2022-03-06 DIAGNOSIS — B9689 Other specified bacterial agents as the cause of diseases classified elsewhere: Secondary | ICD-10-CM

## 2022-03-06 DIAGNOSIS — J019 Acute sinusitis, unspecified: Secondary | ICD-10-CM

## 2022-03-06 MED ORDER — AMOXICILLIN-POT CLAVULANATE 875-125 MG PO TABS: 1.0000 | ORAL_TABLET | Freq: Two times a day (BID) | ORAL | 0 refills | Status: DC

## 2022-03-06 NOTE — Patient Instructions (Signed)
Henry Ray Alyson Reedy., thank you for joining Mar Daring, PA-C for today's virtual visit.  While this provider is not your primary care provider (PCP), if your PCP is located in our provider database this encounter information will be shared with them immediately following your visit.   [facility] account gives you access to today's visit and all your visits, tests, and labs performed at [facility] " click here if you don't have a [facility] account or go to [email or web address]  Consent: (Patient) Henry Ray Alyson Reedy. provided verbal consent for this virtual visit at the beginning of the encounter.  Current Medications:  Current Outpatient Medications:    amoxicillin-clavulanate (AUGMENTIN) 875-125 MG tablet, Take 1 tablet by mouth 2 (two) times daily., Disp: 20 tablet, Rfl: 0   Ascorbic Acid (VITAMIN C) 1000 MG tablet, Take 1,000 mg by mouth daily., Disp: , Rfl:    aspirin EC 81 MG tablet, Take 81 mg by mouth daily., Disp: , Rfl:    atorvastatin (LIPITOR) 10 MG tablet, Take 1 tablet (10 mg total) by mouth daily., Disp: 90 tablet, Rfl: 3   benzonatate (TESSALON) 100 MG capsule, Take 2 capsules (200 mg total) by mouth 3 (three) times daily as needed for cough., Disp: 30 capsule, Rfl: 0   brompheniramine-pseudoephedrine-DM 30-2-10 MG/5ML syrup, Take 2.5 mLs by mouth 4 (four) times daily as needed., Disp: 120 mL, Rfl: 0   empagliflozin (JARDIANCE) 10 MG TABS tablet, Take 1 tablet (10 mg total) by mouth daily before breakfast., Disp: 90 tablet, Rfl: 1   ferrous sulfate 325 (65 FE) MG tablet, Take 325 mg by mouth daily with breakfast., Disp: , Rfl:    fluticasone (FLONASE) 50 MCG/ACT nasal spray, Place 2 sprays into both nostrils daily., Disp: 16 g, Rfl: 0   furosemide (LASIX) 80 MG tablet, Take 1 tablet (80 mg total) by mouth 2 (two) times daily., Disp: 180 tablet, Rfl: 1   ibuprofen (ADVIL,MOTRIN) 200 MG tablet, Take 600 mg by mouth every 6 (six) hours  as needed for mild pain., Disp: , Rfl:    ipratropium (ATROVENT) 0.06 % nasal spray, Place 1-2 sprays into both nostrils 4 (four) times daily. As needed for nasal congestion, Disp: 15 mL, Rfl: 5   KLOR-CON M20 20 MEQ tablet, TAKE 1 TABLET BY MOUTH TWICE A DAY, Disp: 180 tablet, Rfl: 1   meloxicam (MOBIC) 15 MG tablet, Take 15 mg by mouth daily., Disp: , Rfl:    metFORMIN (GLUCOPHAGE) 1000 MG tablet, TAKE 1 TABLET BY MOUTH 2 TIMES DAILY WITH A MEAL., Disp: 180 tablet, Rfl: 1   Multiple Vitamins-Minerals (MULTIVITAMIN WITH MINERALS) tablet, Take 1 tablet by mouth daily., Disp: , Rfl:    tirzepatide (MOUNJARO) 5 MG/0.5ML Pen, Inject 5 mg into the skin once a week., Disp: 2 mL, Rfl: 0   Zinc 15 MG CAPS, daily., Disp: , Rfl:    Medications ordered in this encounter:  Meds ordered this encounter  Medications   amoxicillin-clavulanate (AUGMENTIN) 875-125 MG tablet    Sig: Take 1 tablet by mouth 2 (two) times daily.    Dispense:  20 tablet    Refill:  0    Order Specific Question:   Supervising Provider    Answer:   Chase Picket [id number]     *If you need refills on other medications prior to your next appointment, please contact your pharmacy*  Follow-Up: Call back or seek an in-person evaluation if the symptoms worsen or if the  condition fails to improve as anticipated.  [facility] [phone]  Other Instructions  Sinus Infection, Adult A sinus infection, also called sinusitis, is inflammation of your sinuses. Sinuses are hollow spaces in the bones around your face. Your sinuses are located: Around your eyes. In the middle of your forehead. Behind your nose. In your cheekbones. Mucus normally drains out of your sinuses. When your nasal tissues become inflamed or swollen, mucus can become trapped or blocked. This allows bacteria, viruses, and fungi to grow, which leads to infection. Most infections of the sinuses are caused by a virus. A sinus infection can develop  quickly. It can last for up to 4 weeks (acute) or for more than 12 weeks (chronic). A sinus infection often develops after a cold. What are the causes? This condition is caused by anything that creates swelling in the sinuses or stops mucus from draining. This includes: Allergies. Asthma. Infection from bacteria or viruses. Deformities or blockages in your nose or sinuses. Abnormal growths in the nose (nasal polyps). Pollutants, such as chemicals or irritants in the air. Infection from fungi. This is rare. What increases the risk? You are more likely to develop this condition if you: Have a weak body defense system (immune system). Do a lot of swimming or diving. Overuse nasal sprays. Smoke. What are the signs or symptoms? The main symptoms of this condition are pain and a feeling of pressure around the affected sinuses. Other symptoms include: Stuffy nose or congestion that makes it difficult to breathe through your nose. Thick yellow or greenish drainage from your nose. Tenderness, swelling, and warmth over the affected sinuses. A cough that may get worse at night. Decreased sense of smell and taste. Extra mucus that collects in the throat or the back of the nose (postnasal drip) causing a sore throat or bad breath. Tiredness (fatigue). Fever. How is this diagnosed? This condition is diagnosed based on: Your symptoms. Your medical history. A physical exam. Tests to find out if your condition is acute or chronic. This may include: Checking your nose for nasal polyps. Viewing your sinuses using a device that has a light (endoscope). Testing for allergies or bacteria. Imaging tests, such as an MRI or CT scan. In rare cases, a bone biopsy may be done to rule out more serious types of fungal sinus disease. How is this treated? Treatment for a sinus infection depends on the cause and whether your condition is chronic or acute. If caused by a virus, your symptoms should go away on  their own within 10 days. You may be given medicines to relieve symptoms. They include: Medicines that shrink swollen nasal passages (decongestants). A spray that eases inflammation of the nostrils (topical intranasal corticosteroids). Rinses that help get rid of thick mucus in your nose (nasal saline washes). Medicines that treat allergies (antihistamines). Over-the-counter pain relievers. If caused by bacteria, your health care provider may recommend waiting to see if your symptoms improve. Most bacterial infections will get better without antibiotic medicine. You may be given antibiotics if you have: A severe infection. A weak immune system. If caused by narrow nasal passages or nasal polyps, surgery may be needed. Follow these instructions at home: Medicines Take, use, or apply over-the-counter and prescription medicines only as told by your health care provider. These may include nasal sprays. If you were prescribed an antibiotic medicine, take it as told by your health care provider. Do not stop taking the antibiotic even if you start to feel  better. Hydrate and humidify  Drink enough fluid to keep your urine pale yellow. Staying hydrated will help to thin your mucus. Use a cool mist humidifier to keep the humidity level in your home above 50%. Inhale steam for 10-15 minutes, 3-4 times a day, or as told by your health care provider. You can do this in the bathroom while a hot shower is running. Limit your exposure to cool or dry air. Rest Rest as much as possible. Sleep with your head raised (elevated). Make sure you get enough sleep each night. General instructions  Apply a warm, moist washcloth to your face 3-4 times a day or as told by your health care provider. This will help with discomfort. Use nasal saline washes as often as told by your health care provider. Wash your hands often with soap and water to reduce your exposure to germs. If soap and water are not available, use  hand sanitizer. Do not smoke. Avoid being around people who are smoking (secondhand smoke). Keep all follow-up visits. This is important. Contact a health care provider if: You have a fever. Your symptoms get worse. Your symptoms do not improve within 10 days. Get help right away if: You have a severe headache. You have persistent vomiting. You have severe pain or swelling around your face or eyes. You have vision problems. You develop confusion. Your neck is stiff. You have trouble breathing. These symptoms may be an emergency. Get help right away. Call 911. Do not wait to see if the symptoms will go away. Do not drive yourself to the hospital. Summary A sinus infection is soreness and inflammation of your sinuses. Sinuses are hollow spaces in the bones around your face. This condition is caused by nasal tissues that become inflamed or swollen. The swelling traps or blocks the flow of mucus. This allows bacteria, viruses, and fungi to grow, which leads to infection. If you were prescribed an antibiotic medicine, take it as told by your health care provider. Do not stop taking the antibiotic even if you start to feel better. Keep all follow-up visits. This is important. This information is not intended to replace advice given to you by your health care provider. Make sure you discuss any questions you have with your health care provider. Document Revised: 03/22/2021 Document Reviewed: 03/22/2021 Elsevier Patient Education  [place].    If you have been instructed to have an in-person evaluation today at a local Urgent Care facility, please use the link below. It will take you to a list of all of our available [facility], including address, phone number and hours of operation. Please do not delay care.  [facility]  If you or a family member do not have a primary care provider, use the link below to schedule a visit and establish care. When you  choose a [facility] primary care physician or advanced practice provider, you gain a long-term partner in health. Find a Primary Care Provider  Learn more about 's in-office and virtual care options: [facility]

## 2022-03-06 NOTE — Telephone Encounter (Signed)
Wegovy not ordered.

## 2022-03-06 NOTE — Telephone Encounter (Signed)
Pharmacy is requesting an alternative due to [facility] out of stock on back order

## 2022-03-06 NOTE — Progress Notes (Signed)
Virtual Visit Consent   Penn Ray Dewitte Vannice., you are scheduled for a virtual visit with a [facility] provider today. Just as with appointments in the office, your consent must be obtained to participate. Your consent will be active for this visit and any virtual visit you may have with one of our providers in the next 365 days. If you have a MyChart account, a copy of this consent can be sent to you electronically.  As this is a virtual visit, video technology does not allow for your provider to perform a traditional examination. This may limit your provider's ability to fully assess your condition. If your provider identifies any concerns that need to be evaluated in person or the need to arrange testing (such as labs, EKG, etc.), we will make arrangements to do so. Although advances in technology are sophisticated, we cannot ensure that it will always work on either your end or our end. If the connection with a video visit is poor, the visit may have to be switched to a telephone visit. With either a video or telephone visit, we are not always able to ensure that we have a secure connection.  By engaging in this virtual visit, you consent to the provision of healthcare and authorize for your insurance to be billed (if applicable) for the services provided during this visit. Depending on your insurance coverage, you may receive a charge related to this service.  I need to obtain your verbal consent now. Are you willing to proceed with your visit today? Henry Hinton. has provided verbal consent on 03/06/2022 for a virtual visit (video or telephone). Mar Daring, PA-C  Date: 03/06/2022 8:55 AM  Virtual Visit via Video Note   I, Mar Daring, connected with  Henry Hinton.  ([id number], Mar 20, 1961) on 03/06/22 at  8:45 AM EST by a video-enabled telemedicine application and verified that I am speaking with the correct person using two identifiers.  Location: Patient: Virtual  Visit Location Patient: Home Provider: Virtual Visit Location Provider: Home Office   I discussed the limitations of evaluation and management by telemedicine and the availability of in person appointments. The patient expressed understanding and agreed to proceed.    History of Present Illness: Henry Hinton. is a 61 y.o. who identifies as a male who was assigned male at birth, and is being seen today for possible sinus infection.  HPI: Sinusitis This is a new problem. The current episode started 1 to 4 weeks ago (seen virtually on 02/23/22 and advised to continue symptomatic medications, diagnosed viral URI with cough). The problem has been gradually worsening since onset. The maximum temperature recorded prior to his arrival was 103 - 104 F (103.3 a few days after 02/23/22 visit). The fever has been present for Less than 1 day. Associated symptoms include chills, congestion, ear pain (left ear pain and tinnitus in left), headaches, a hoarse voice and sinus pressure. (Night sweats) Past treatments include acetaminophen, oral decongestants, nasal decongestants and saline nose sprays (mucinex dm, zyrtec, flonase, saline nasal rinses). The treatment provided mild relief.     Problems:  Patient Active Problem List   Diagnosis Date Noted   Acute hypoxemic respiratory failure due to COVID-19 [facility]) 05/18/2019   BPH without obstruction/lower urinary tract symptoms 03/14/2018   Family history of prostate cancer 03/14/2018   History of nephrolithiasis 03/14/2018   Microscopic hematuria 03/14/2018   Diarrhea 11/20/2017   Diabetes mellitus without complication ([place]) 60/73/7106  Iron deficiency anemia 11/19/2017   Influenza, pneumonia 09/22/2017   Abdominal wall cellulitis 01/22/2017   Insect bite 01/22/2017   Hyperkalemia 01/22/2017   OSA (obstructive sleep apnea) 01/22/2017   Essential hypertension    Hoarseness 06/08/2016   Vocal cord paralysis 01/31/2016   Hypercalciuria 01/10/2016    Cellulitis 11/28/2015   Cellulitis of trunk 11/27/2015   Nonspecific abnormal finding in stool contents    Colon cancer screening    Venous stasis    History of DVT (deep vein thrombosis)    Sepsis ([place]) 10/09/2015   Panniculitis 10/09/2015   CLL (chronic lymphocytic leukemia) ([place]) 05/27/2015   Calculi, ureter 10/27/2014   Calculus of kidney 10/27/2014   Leukocytosis (leucocytosis) 08/29/2014   HNP (herniated nucleus pulposus), cervical 01/21/2013   bilat hip replacement 04/08/2012   Morbid obesity ([place]) 04/08/2012   Edema 04/08/2012    Allergies:  Allergies  Allergen Reactions   Adhesive [Tape] Other (See Comments)    Blisters and rash at site   Other    Medications:  Current Outpatient Medications:    amoxicillin-clavulanate (AUGMENTIN) 875-125 MG tablet, Take 1 tablet by mouth 2 (two) times daily., Disp: 20 tablet, Rfl: 0   Ascorbic Acid (VITAMIN C) 1000 MG tablet, Take 1,000 mg by mouth daily., Disp: , Rfl:    aspirin EC 81 MG tablet, Take 81 mg by mouth daily., Disp: , Rfl:    atorvastatin (LIPITOR) 10 MG tablet, Take 1 tablet (10 mg total) by mouth daily., Disp: 90 tablet, Rfl: 3   benzonatate (TESSALON) 100 MG capsule, Take 2 capsules (200 mg total) by mouth 3 (three) times daily as needed for cough., Disp: 30 capsule, Rfl: 0   brompheniramine-pseudoephedrine-DM 30-2-10 MG/5ML syrup, Take 2.5 mLs by mouth 4 (four) times daily as needed., Disp: 120 mL, Rfl: 0   empagliflozin (JARDIANCE) 10 MG TABS tablet, Take 1 tablet (10 mg total) by mouth daily before breakfast., Disp: 90 tablet, Rfl: 1   ferrous sulfate 325 (65 FE) MG tablet, Take 325 mg by mouth daily with breakfast., Disp: , Rfl:    fluticasone (FLONASE) 50 MCG/ACT nasal spray, Place 2 sprays into both nostrils daily., Disp: 16 g, Rfl: 0   furosemide (LASIX) 80 MG tablet, Take 1 tablet (80 mg total) by mouth 2 (two) times daily., Disp: 180 tablet, Rfl: 1   ibuprofen (ADVIL,MOTRIN) 200 MG tablet, Take 600 mg by mouth  every 6 (six) hours as needed for mild pain., Disp: , Rfl:    ipratropium (ATROVENT) 0.06 % nasal spray, Place 1-2 sprays into both nostrils 4 (four) times daily. As needed for nasal congestion, Disp: 15 mL, Rfl: 5   KLOR-CON M20 20 MEQ tablet, TAKE 1 TABLET BY MOUTH TWICE A DAY, Disp: 180 tablet, Rfl: 1   meloxicam (MOBIC) 15 MG tablet, Take 15 mg by mouth daily., Disp: , Rfl:    metFORMIN (GLUCOPHAGE) 1000 MG tablet, TAKE 1 TABLET BY MOUTH 2 TIMES DAILY WITH A MEAL., Disp: 180 tablet, Rfl: 1   Multiple Vitamins-Minerals (MULTIVITAMIN WITH MINERALS) tablet, Take 1 tablet by mouth daily., Disp: , Rfl:    tirzepatide (MOUNJARO) 5 MG/0.5ML Pen, Inject 5 mg into the skin once a week., Disp: 2 mL, Rfl: 0   Zinc 15 MG CAPS, daily., Disp: , Rfl:   Observations/Objective: Patient is well-developed, well-nourished in no acute distress.  Resting comfortably at home.  Head is normocephalic, atraumatic.  No labored breathing.  Speech is clear and coherent with logical content.  Patient is  alert and oriented at baseline.  Congested sounding voice  Assessment and Plan: 1. Acute bacterial sinusitis - amoxicillin-clavulanate (AUGMENTIN) 875-125 MG tablet; Take 1 tablet by mouth 2 (two) times daily.  Dispense: 20 tablet; Refill: 0  - Worsening symptoms that have not responded to OTC medications.  - Will give Augmentin - Continue allergy medications.  - Steam and humidifier can help - Stay well hydrated and get plenty of rest.  - Seek in person evaluation if no symptom improvement or if symptoms worsen   Follow Up Instructions: I discussed the assessment and treatment plan with the patient. The patient was provided an opportunity to ask questions and all were answered. The patient agreed with the plan and demonstrated an understanding of the instructions.  A copy of instructions were sent to the patient via MyChart unless otherwise noted below.    The patient was advised to call back or seek an  in-person evaluation if the symptoms worsen or if the condition fails to improve as anticipated.  Time:  I spent 9 minutes with the patient via telehealth technology discussing the above problems/concerns.    Mar Daring, PA-C

## 2022-03-06 NOTE — Telephone Encounter (Signed)
Prior message interrupted. Wegovy not ordered. Mounjaro ordered. He is on metformin - should try prior auth to see if covered. Please forward to prior auth team and let patient know we are working on this. Thanks.

## 2022-03-07 NOTE — Telephone Encounter (Signed)
Please see my prior note and instructions.  He has taken Trulicity previously.  He is requesting Mounjaro, and would like to see if we can proceed with prior authorization to see if that can get covered.  My understanding is that if patients have been on metformin, that will help with prior authorization.  I tried to route to "Emma", but that is not listed as an option when I try to route the message myself. Please let me know if this needs to be routed differently.

## 2022-03-07 NOTE — Telephone Encounter (Signed)
Pharmacy states the [facility] is not covered they are suggesting these alternatives  Vicoza 18 mg, Trulicity 5.82 mg or Ozempic 0.25 or 0.5 mg

## 2022-04-03 ENCOUNTER — Other Ambulatory Visit (HOSPITAL_COMMUNITY): Payer: Self-pay

## 2022-04-03 NOTE — Telephone Encounter (Signed)
Pharmacy Patient Advocate Encounter   Received notification that prior authorization for Henry Hinton is required/requested.    PA has not be submitted CoverMyMeds Key [id number]

## 2022-04-04 ENCOUNTER — Other Ambulatory Visit (HOSPITAL_COMMUNITY): Payer: Self-pay

## 2022-04-04 NOTE — Telephone Encounter (Signed)
Please see previous encounter notes. Pt has tried Trulicity but not Victoza or Ozempic. Darcel Bayley will not be covered until we can show a trial and failure of at least one of these options.

## 2022-04-04 NOTE — Telephone Encounter (Signed)
Another formulary alternative is Rybelsus according to the insurance

## 2022-04-04 NOTE — Telephone Encounter (Signed)
Has OV tomorrow - will discuss at that time.

## 2022-04-04 NOTE — Telephone Encounter (Signed)
Please see previous encounter notes. Pt has tried Trulicity but not Victoza or Ozempic. Henry Hinton will not be covered until we can show a trial and failure of at least one of these options.  Apparently this was in the screen shot of cover my meds that I couldn't read pt cannot have Mounjaro till he tries one of the covered options   Please advise

## 2022-04-04 NOTE — Telephone Encounter (Signed)
Pt needs PA mounjaro rx from11/4/23 please process ASAP

## 2022-04-05 ENCOUNTER — Ambulatory Visit (INDEPENDENT_AMBULATORY_CARE_PROVIDER_SITE_OTHER): Payer: BC Managed Care – PPO | Admitting: Family Medicine

## 2022-04-05 ENCOUNTER — Encounter: Payer: Self-pay | Admitting: Family Medicine

## 2022-04-05 VITALS — BP 130/76 | HR 66 | Temp 97.9°F | Ht 69.0 in | Wt 345.0 lb

## 2022-04-05 DIAGNOSIS — R609 Edema, unspecified: Secondary | ICD-10-CM

## 2022-04-05 DIAGNOSIS — E1165 Type 2 diabetes mellitus with hyperglycemia: Secondary | ICD-10-CM

## 2022-04-05 DIAGNOSIS — Z6841 Body Mass Index (BMI) 40.0 and over, adult: Secondary | ICD-10-CM

## 2022-04-05 DIAGNOSIS — Z23 Encounter for immunization: Secondary | ICD-10-CM

## 2022-04-05 LAB — BASIC METABOLIC PANEL
BUN: 19 mg/dL (ref 6–23)
CO2: 32 mEq/L (ref 19–32)
Calcium: 8.8 mg/dL (ref 8.4–10.5)
Chloride: 99 mEq/L (ref 96–112)
Creatinine, Ser: 0.79 mg/dL (ref 0.40–1.50)
GFR: 95.7 mL/min (ref >60.00)
Glucose, Bld: 157 mg/dL — ABNORMAL HIGH (ref 70–99)
Potassium: 4.4 mEq/L (ref 3.5–5.1)
Sodium: 139 mEq/L (ref 135–145)

## 2022-04-05 LAB — HEMOGLOBIN A1C: Hgb A1c MFr Bld: 8.4 % — ABNORMAL HIGH (ref 4.6–6.5)

## 2022-04-05 MED ORDER — POTASSIUM CHLORIDE CRYS ER 20 MEQ PO TBCR: 20.0000 meq | EXTENDED_RELEASE_TABLET | Freq: Two times a day (BID) | ORAL | 1 refills | Status: DC

## 2022-04-05 MED ORDER — FUROSEMIDE 80 MG PO TABS: 80.0000 mg | ORAL_TABLET | Freq: Two times a day (BID) | ORAL | 1 refills | Status: DC

## 2022-04-05 MED ORDER — METFORMIN HCL 1000 MG PO TABS: ORAL_TABLET | ORAL | 1 refills | Status: DC

## 2022-04-05 NOTE — Telephone Encounter (Signed)
Office visit today, decided to try Ozempic after his Trulicity runs out in about 6 weeks.  He will let me know when that prescription is needed.

## 2022-04-05 NOTE — Patient Instructions (Addendum)
If A1c is still elevated I would consider a low dose of long acting insulin. We can start ozempic after trulicity runs out, let me know when you are ready for that prescription. No other changes today.   Follow up in 3 months. Let me know if there are questions sooner.

## 2022-04-05 NOTE — Progress Notes (Signed)
Subjective:  Patient ID: Henry Navy., male    DOB: March 16, 1961  Age: 61 y.o. MRN: [id number]  CC:  Chief Complaint  Patient presents with   Diabetes    Pt states all is well    HPI Henry Hinton. presents for   Diabetes: Complicated by hyperglycemia, obesity.  Has been seen by nutritionist.  He is on statin with Lipitor, chronic lower extremity treated with furosemide 80 mgNID, klorcon 76m bid.  Followed by hematology for CLL.  Last visit in August had just started Jardiance few weeks prior without mycotic or UTI symptoms.  Exercising with swimming 4 to 5 days/week.  Weight was improving at home last visit, but A1c was still high at 8.1.  Continued Jardiance 10 mg daily, metformin 1000 mg twice daily, Trulicity 4.5 mg daily..  See phone note in September.  He stopped JVania Reahas attributed that to cramping.  He reported home readings from 142-149 postprandial.  Postprandial, cramping resolved off Jardiance.  See November 4 message.  [facility]  [facility]armacy team has been working on prior authorization, Rybelsus, Victoza, or Ozempic required prior to [place] June 2024.   Still taking 47.6HYtrulicity - 6 weeks left.  Running in water and swimming. Weight is stable. Home weight 340.8.  No new side effects with meds.  Home readings 140's postprandial Fasting 126-130's.  Microalbumin: nl ratio 12/09/21.  Optho, foot exam, pneumovax:   Flu vaccine - agrees to today.  Shingles vaccine - declines.   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   History of DVT (deep vein thrombosis) 08/19/2019   Venous stasis 08/19/2019   Essential hypertension 08/19/2019   Acute hypoxemic respiratory failure due to COVID-19 ([facility] 05/18/2019   BPH without obstruction/lower urinary tract symptoms 03/14/2018   Family history of prostate cancer 03/14/2018   History of nephrolithiasis 03/14/2018   Microscopic hematuria 03/14/2018   Diabetes mellitus without complication ([place] 007/37/1062  Iron deficiency anemia 11/19/2017   Influenza, pneumonia 09/22/2017   Abdominal wall cellulitis 01/22/2017   Insect bite 01/22/2017   Hyperkalemia 01/22/2017   OSA (obstructive sleep apnea) 01/22/2017   Vocal cord paralysis 01/31/2016   Hypercalciuria 01/10/2016   Cellulitis 11/28/2015   Cellulitis of trunk 11/27/2015   Nonspecific abnormal finding in stool contents    Colon cancer screening    [place]epsis ([place] 10/09/2015   Panniculitis 10/09/2015   CLL (chronic lymphocytic leukemia) ([place] 05/27/2015   Calculi, ureter 10/27/2014   Calculus of kidney 10/27/2014   Leukocytosis (leucocytosis) 08/29/2014   HNP (herniated nucleus pulposus), cervical 01/21/2013   bilat hip replacement 04/08/2012   Morbid obesity ([place] 04/08/2012   Edema 04/08/2012   Past Medical History:  Diagnosis Date   Anemia    Arthritis    BMI 50.0-59.9, adult ([place]    Cellulitis 11/2015   trunk   cll 11/2014   CLL - 11/30/2014- [facility] [place]  DVT (deep venous thrombosis) ([place]) 6/16   Rt calf  took xarelto for 6 months   Family history of anesthesia complication    " father having delirium after anesthesia"   Heart murmur    Hx; of as a child   History of kidney stones    x2   Joint pain    Hx: of periodically   Numbness and tingling    Hx: of in right arm   Pneumonia    Sleep apnea    Cpap use- settings 10   Past Surgical History:  Procedure Laterality Date   BACK SURGERY     COLONOSCOPY     Hx: of    COLONOSCOPY N/A 11/23/2015   Procedure: COLONOSCOPY;  Surgeon: Irene Shipper, MD;  Location: [facility];  Service: Endoscopy;  Laterality: N/A;   HERNIA REPAIR N/A    Phreesia 03/19/2020   INGUINAL HERNIA REPAIR Left 06/01/2015   Procedure: LEFT INGUINAL HERNIA REPAIR WITH MESH;  Surgeon: Autumn Messing III, MD;  Location: [facility] ORS;  Service: General;  Laterality: Left;   INGUINAL HERNIA REPAIR     INSERTION OF MESH Left 06/01/2015   Procedure: INSERTION OF MESH;  Surgeon: Autumn Messing III, MD;  Location: [facility] ORS;  Service: General;  Laterality: Left;   JOINT REPLACEMENT Bilateral    BTHA   KNEE ARTHROSCOPY Left 07/21/2014   Dr. Noemi Chapel   LUMBAR LAMINECTOMY  2005   POSTERIOR CERVICAL LAMINECTOMY WITH MET- RX Right 01/21/2013   Procedure: Right Sitting C6-7 Microdiskectomy with Met-rex;  Surgeon: Kristeen Miss, MD;  Location: [facility];  Service: Neurosurgery;  Laterality: Right;  Right Sitting C6-7 Microdiskectomy with Met-rex   SPINE SURGERY N/A    Phreesia 03/19/2020   STENT PLACEMENT RT URETER ([facility] HX)  10/23/2014   tenosynovitis  2000   Hx: of thumb   TONSILLECTOMY     TOTAL HIP ARTHROPLASTY Bilateral    WISDOM TOOTH EXTRACTION  1974   all 4   Allergies  Allergen Reactions   Adhesive [Tape] Other (See Comments)    Blisters and rash at site   Other    Prior to Admission medications   Medication Sig Start Date End Date Taking? Authorizing Provider  Ascorbic Acid (VITAMIN C) 1000 MG tablet Take 1,000 mg by mouth daily.   Yes [provider]  aspirin EC 81 MG tablet Take 81 mg by mouth daily.   Yes [provider]  atorvastatin (LIPITOR) 10 MG tablet Take 1 tablet (10 mg total) by mouth daily. 06/02/21  Yes [name], MD  ferrous sulfate 325 (65 FE) MG tablet Take 325 mg by mouth daily with breakfast.   Yes [provider]  fluticasone (FLONASE) 50 MCG/ACT nasal spray Place 2 sprays into both nostrils daily. 05/19/21  Yes Perlie Mayo, NP  furosemide  (LASIX) 80 MG tablet Take 1 tablet (80 mg total) by mouth 2 (two) times daily. 06/02/21  Yes [name], MD  ibuprofen (ADVIL,MOTRIN) 200 MG tablet Take 600 mg by mouth every 6 (six) hours as needed for mild pain.   Yes [provider]  ipratropium (ATROVENT) 0.06 % nasal spray Place 1-2 sprays into both nostrils 4 (four) times daily. As needed for nasal congestion 09/13/21  Yes [name], MD  ipratropium (ATROVENT) 0.06 % nasal spray Place into the nose. 09/13/21  Yes [provider]  KLOR-CON M20 20 MEQ tablet TAKE 1 TABLET BY MOUTH TWICE A DAY 12/05/21  Yes  [name], MD  meloxicam (MOBIC) 15 MG tablet Take 15 mg by mouth daily. 07/20/21  Yes [provider]  metFORMIN (GLUCOPHAGE) 1000 MG tablet TAKE 1 TABLET BY MOUTH 2 TIMES DAILY WITH A MEAL. 12/09/21  Yes [name], MD  Multiple Vitamins-Minerals (MULTIVITAMIN WITH MINERALS) tablet Take 1 tablet by mouth daily.   Yes [provider]  Zinc 15 MG CAPS daily. 05/22/19  Yes [provider]  amoxicillin-clavulanate (AUGMENTIN) 875-125 MG tablet Take 1 tablet by mouth 2 (two) times daily. Patient not taking: Reported on 04/05/2022 03/06/22   Mar Daring, PA-C  benzonatate (TESSALON) 100 MG capsule Take 2 capsules (200 mg total) by mouth 3 (three) times daily as needed for cough. Patient not taking: Reported on 04/05/2022 02/23/22   Perlie Mayo, NP  brompheniramine-pseudoephedrine-DM 30-2-10 MG/5ML syrup Take 2.5 mLs by mouth 4 (four) times daily as needed. Patient not taking: Reported on 04/05/2022 02/23/22   Perlie Mayo, NP  empagliflozin (JARDIANCE) 10 MG TABS tablet Take 1 tablet (10 mg total) by mouth daily before breakfast. Patient not taking: Reported on 04/05/2022 12/09/21   [name], MD  tirzepatide [facility]) 5 MG/0.5ML Pen Inject 5 mg into the skin once a week. Patient not taking: Reported on 04/05/2022 03/04/22   [name], MD   Social History    Socioeconomic History   Marital status: Divorced    Spouse name: Not on file   Number of children: 0   Years of education: Not on file   Highest education level: Not on file  Occupational History   Occupation: Conservation officer, nature    Employer: [employer]  Tobacco Use   Smoking status: Never   Smokeless tobacco: Former    Types: Chew    Quit date: 05/01/1993  Vaping Use   Vaping Use: Never used  Substance and Sexual Activity   Alcohol use: Yes    Alcohol/week: 8.0 standard drinks of alcohol    Types: 4 Cans of beer, 4 Shots of liquor per week    Comment: BEER AND LIQUOR   Drug use: No   Sexual activity: Not Currently  Other Topics Concern   Not on file  Social History Narrative   Exercise swimming 3-5 times/week for 45 minutes   Social Determinants of Health   Financial Resource Strain: Not on file  Food Insecurity: Not on file  Transportation Needs: Not on file  Physical Activity: Not on file  Stress: Not on file  Social Connections: Not on file  Intimate Partner Violence: Not on file    Review of Systems  Constitutional:  Negative for fatigue and unexpected weight change.  Eyes:  Negative for visual disturbance.  Respiratory:  Negative for cough, chest tightness and shortness of breath.   Cardiovascular:  Negative for chest pain, palpitations and leg swelling.  Gastrointestinal:  Negative for abdominal pain and blood in stool.  Neurological:  Negative for dizziness, light-headedness and headaches.     Objective:   Vitals:   04/05/22 0851  BP: 130/76  Pulse: 66  Temp: 97.9 F (36.6 C)  SpO2: 94%  Weight: (!) 345 lb (156.5 kg)  Height: _0  (1.753 m)     Physical Exam Vitals reviewed.  Constitutional:      Appearance: He is well-developed.  HENT:     Head: Normocephalic and atraumatic.  Neck:     Vascular: No carotid bruit or JVD.  Cardiovascular:     Rate and Rhythm:  Normal rate and regular rhythm.     Heart sounds: Normal  heart sounds. No murmur heard. Pulmonary:     Effort: Pulmonary effort is normal.     Breath sounds: Normal breath sounds. No rales.  Musculoskeletal:     Right lower leg: No edema.     Left lower leg: No edema.     Comments: Stasis changes lower legs.   Skin:    General: Skin is warm and dry.  Neurological:     Mental Status: He is alert and oriented to person, place, and time.  Psychiatric:        Mood and Affect: Mood normal.     Assessment & Plan:  Henry Hinton. is a 61 y.o. male . Type 2 diabetes mellitus with hyperglycemia, without long-term current use of insulin (HCC) - Plan: metFORMIN (GLUCOPHAGE) 1000 MG tablet, Basic metabolic panel, Hemoglobin A1c  -Unfortunately A1c has remained elevated in the low 8 range, and intolerant to Jardiance with muscle cramping that improved off med.  Currently on Trulicity.  Would like to try [facility] but stepwise approach needed.  Plan to change to Ozempic, he would like to continue what he has of Trulicity first and will call me when ready for change.  If A1c still elevated would consider low-dose long-acting insulin for now.  Continue metformin same dose for now.  Peripheral edema - Plan: furosemide (LASIX) 80 MG tablet, potassium chloride SA (KLOR-CON M20) 20 MEQ tablet  -Stable, continue furosemide, potassium, check electrolytes above.  Needs flu shot - Plan: Flu Vaccine QUAD 54moIM (Fluarix, Fluzone & Alfiuria Quad PF)  BMI 50.0-59.9, adult (HCC)  -Weight stable, commended on exercise, initiation of Ozempic should help.  See above.  Meds ordered this encounter  Medications   furosemide (LASIX) 80 MG tablet    Sig: Take 1 tablet (80 mg total) by mouth 2 (two) times daily.    Dispense:  180 tablet    Refill:  1    DX Code Needed  .   potassium chloride SA (KLOR-CON M20) 20 MEQ tablet    Sig: Take 1 tablet (20 mEq total) by mouth 2 (two) times daily.    Dispense:  180 tablet    Refill:  1   metFORMIN (GLUCOPHAGE) 1000 MG  tablet    Sig: TAKE 1 TABLET BY MOUTH 2 TIMES DAILY WITH A MEAL.    Dispense:  180 tablet    Refill:  1   Patient Instructions  If A1c is still elevated I would consider a low dose of long acting insulin. We can start ozempic after trulicity runs out, let me know when you are ready for that prescription. No other changes today.   Follow up in 3 months.     Signed,   JMerri Ray MD [facility] 04/05/22 9:40 AM

## 2022-04-06 ENCOUNTER — Encounter: Payer: Self-pay | Admitting: Family Medicine

## 2022-04-06 NOTE — Telephone Encounter (Signed)
Pt replied back about medication and Diabetes Please advise

## 2022-04-24 ENCOUNTER — Encounter: Payer: Self-pay | Admitting: Family Medicine

## 2022-04-24 DIAGNOSIS — E1165 Type 2 diabetes mellitus with hyperglycemia: Secondary | ICD-10-CM

## 2022-04-24 DIAGNOSIS — Z6841 Body Mass Index (BMI) 40.0 and over, adult: Secondary | ICD-10-CM

## 2022-04-25 ENCOUNTER — Encounter: Payer: Self-pay | Admitting: Family Medicine

## 2022-04-25 MED ORDER — SEMAGLUTIDE (1 MG/DOSE) 4 MG/3ML ~~LOC~~ SOPN: 1.0000 mg | PEN_INJECTOR | SUBCUTANEOUS | 0 refills | Status: DC

## 2022-04-26 NOTE — Telephone Encounter (Signed)
Pt would like to go by your recomendation

## 2022-04-28 ENCOUNTER — Other Ambulatory Visit: Payer: Self-pay | Admitting: Family Medicine

## 2022-04-28 DIAGNOSIS — E1165 Type 2 diabetes mellitus with hyperglycemia: Secondary | ICD-10-CM

## 2022-04-28 DIAGNOSIS — Z6841 Body Mass Index (BMI) 40.0 and over, adult: Secondary | ICD-10-CM

## 2022-05-25 LAB — HM DIABETES EYE EXAM

## 2022-05-30 ENCOUNTER — Encounter: Payer: Self-pay | Admitting: Family Medicine

## 2022-06-09 ENCOUNTER — Encounter: Payer: Self-pay | Admitting: Family Medicine

## 2022-06-16 ENCOUNTER — Encounter: Payer: Self-pay | Admitting: Family Medicine

## 2022-06-16 NOTE — Telephone Encounter (Signed)
Pt is scheduled for Monday at 9 am

## 2022-06-19 ENCOUNTER — Encounter: Payer: Self-pay | Admitting: Family Medicine

## 2022-06-19 ENCOUNTER — Telehealth (INDEPENDENT_AMBULATORY_CARE_PROVIDER_SITE_OTHER): Payer: BC Managed Care – PPO | Admitting: Family Medicine

## 2022-06-19 DIAGNOSIS — J019 Acute sinusitis, unspecified: Secondary | ICD-10-CM

## 2022-06-19 MED ORDER — AMOXICILLIN-POT CLAVULANATE 875-125 MG PO TABS: 1.0000 | ORAL_TABLET | Freq: Two times a day (BID) | ORAL | 0 refills | Status: DC

## 2022-06-19 NOTE — Progress Notes (Signed)
Virtual Visit via Video Note  I connected with Henry Hinton. on 06/19/22 at 9:40 AM by a video enabled telemedicine application and verified that I am speaking with the correct person using two identifiers.  Patient location: home. by self.  My location: office - [place].    I discussed the limitations, risks, security and privacy concerns of performing an evaluation and management service by telephone and the availability of in person appointments. I also discussed with the patient that there may be a patient responsible charge related to this service. The patient expressed understanding and agreed to proceed, consent obtained  Chief complaint:  Chief Complaint  Patient presents with   Nasal Congestion    Pt notes started 11 days ago with sinus drainage, phlem, headache, sinus pressure, each fullness, coughing, sore throat, fever first few days 100.4 temp, taking sudafed, pt Negative COVID Friday last week and Saturday 2/10    History of Present Illness: Henry Hinton. is a 62 y.o. male  Nasal congestion: Started 11 days ago with sinus pressure, drainage, cough, sore throat, fever first few days at 100.4 and resolved - no recent fever.    He did have a negative COVID test on February 10 and 16. Persistent nasal congestion/pressure. Sinus drainage.  Treated with Sudafed  - some relief of congestion, at night. Min relief with zicam. Yellow-green phlegm. Cough more form throat/PND, no chest congestion/dyspnea. Saline ns QID,  once per day.   frontal sinus and cheek soreness. Pressure in ears.   Drinking fluids ok.  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   History of DVT (deep vein thrombosis) 08/19/2019   Venous stasis 08/19/2019   Essential hypertension 08/19/2019   Acute hypoxemic respiratory failure due to COVID-19 [facility]) 05/18/2019   BPH without obstruction/lower urinary tract symptoms 03/14/2018   Family history  of prostate cancer 03/14/2018   History of nephrolithiasis 03/14/2018   Microscopic hematuria 03/14/2018   Diabetes mellitus without complication ([place]) 123456   Iron deficiency anemia 11/19/2017   Influenza, pneumonia 09/22/2017   Abdominal wall cellulitis 01/22/2017   Insect bite 01/22/2017   Hyperkalemia 01/22/2017   OSA (obstructive sleep apnea) 01/22/2017   Vocal cord paralysis 01/31/2016   Hypercalciuria 01/10/2016   Cellulitis 11/28/2015   Cellulitis of trunk 11/27/2015   Nonspecific abnormal finding in stool contents    Colon cancer screening    Sepsis ([place]) 10/09/2015   Panniculitis 10/09/2015   CLL (chronic lymphocytic leukemia) ([place]) 05/27/2015   Calculi, ureter 10/27/2014   Calculus of kidney 10/27/2014   Leukocytosis (leucocytosis) 08/29/2014   HNP (herniated nucleus pulposus), cervical 01/21/2013   bilat hip replacement 04/08/2012   Morbid obesity ([place]) 04/08/2012   Edema 04/08/2012   Past Medical History:  Diagnosis Date   Anemia    Arthritis    BMI 50.0-59.9, adult ([place])    Cellulitis 11/2015   trunk   cll 11/2014   CLL - 11/30/2014- Dr. [facility] [place] [place]   DVT (deep venous thrombosis) ([place]) 6/16   Rt calf  took xarelto for 6 months   Family history of anesthesia complication    " father having delirium after anesthesia"   Heart murmur    Hx; of as a child   History of kidney stones    x2   Joint pain    Hx: of periodically   Numbness and tingling    Hx: of in right arm  Pneumonia    Sleep apnea    Cpap use- settings 10   Past Surgical History:  Procedure Laterality Date   BACK SURGERY     COLONOSCOPY     Hx: of   COLONOSCOPY N/A 11/23/2015   Procedure: COLONOSCOPY;  Surgeon: Irene Shipper, MD;  Location: [facility];  Service: Endoscopy;  Laterality: N/A;   HERNIA REPAIR N/A    [employer] 03/19/2020   INGUINAL HERNIA REPAIR Left 06/01/2015   Procedure: LEFT INGUINAL HERNIA REPAIR WITH MESH;  Surgeon: Autumn Messing III, MD;   Location: [facility] ORS;  Service: General;  Laterality: Left;   INGUINAL HERNIA REPAIR     INSERTION OF MESH Left 06/01/2015   Procedure: INSERTION OF MESH;  Surgeon: Autumn Messing III, MD;  Location: [facility] ORS;  Service: General;  Laterality: Left;   JOINT REPLACEMENT Bilateral    BTHA   KNEE ARTHROSCOPY Left 07/21/2014   Dr. Noemi Chapel   LUMBAR LAMINECTOMY  2005   POSTERIOR CERVICAL LAMINECTOMY WITH MET- RX Right 01/21/2013   Procedure: Right Sitting C6-7 Microdiskectomy with Met-rex;  Surgeon: Kristeen Miss, MD;  Location: [place] NEURO ORS;  Service: Neurosurgery;  Laterality: Right;  Right Sitting C6-7 Microdiskectomy with Met-rex   SPINE SURGERY N/A    [employer]reesia 03/19/2020   STENT PLACEMENT RT URETER ([facility] HX)  10/23/2014   tenosynovitis  2000   Hx: of thumb   TONSILLECTOMY     TOTAL HIP ARTHROPLASTY Bilateral    WISDOM TOOTH EXTRACTION  1974   all 4   Allergies  Allergen Reactions   Adhesive [Tape] Other (See Comments)    Blisters and rash at site   Other    Prior to Admission medications   Medication Sig Start Date End Date Taking? Authorizing Provider  Ascorbic Acid (VITAMIN C) 1000 MG tablet Take 1,000 mg by mouth daily.   Yes [provider]  aspirin EC 81 MG tablet Take 81 mg by mouth daily.   Yes [provider]  atorvastatin (LIPITOR) 10 MG tablet Take 1 tablet (10 mg total) by mouth daily. 06/02/21  Yes [name], MD  ferrous sulfate 325 (65 FE) MG tablet Take 325 mg by mouth daily with breakfast.   Yes [provider]  fluticasone (FLONASE) 50 MCG/ACT nasal spray Place 2 sprays into both nostrils daily. 05/19/21  Yes Perlie Mayo, NP  furosemide (LASIX) 80 MG tablet Take 1 tablet (80 mg total) by mouth 2 (two) times daily. 04/05/22  Yes [name], MD  ibuprofen (ADVIL,MOTRIN) 200 MG tablet Take 600 mg by mouth every 6 (six) hours as needed for mild pain.   Yes [provider]  ipratropium (ATROVENT) 0.06 % nasal spray Place 1-2 sprays into  both nostrils 4 (four) times daily. As needed for nasal congestion 09/13/21  Yes [name], MD  ipratropium (ATROVENT) 0.06 % nasal spray Place into the nose. 09/13/21  Yes [provider]  meloxicam (MOBIC) 15 MG tablet Take 15 mg by mouth daily. 07/20/21  Yes [provider]  metFORMIN (GLUCOPHAGE) 1000 MG tablet TAKE 1 TABLET BY MOUTH 2 TIMES DAILY WITH A MEAL. 04/05/22  Yes [name], MD  Multiple Vitamins-Minerals (MULTIVITAMIN WITH MINERALS) tablet Take 1 tablet by mouth daily.   Yes [provider]  potassium chloride SA (KLOR-CON M20) 20 MEQ tablet Take 1 tablet (20 mEq total) by mouth 2 (two) times daily. 04/05/22  Yes [name], MD  Semaglutide, 1 MG/DOSE, 4 MG/3ML [facility]  Inject 1 mg as directed once a week. 04/25/22  Yes [name], MD  Zinc 15 MG CAPS daily. 05/22/19  Yes [provider]  empagliflozin (JARDIANCE) 10 MG TABS tablet Take 1 tablet (10 mg total) by mouth daily before breakfast. Patient not taking: Reported on 04/05/2022 12/09/21   [name], MD   Social History   Socioeconomic History   Marital status: Divorced    Spouse name: Not on file   Number of children: 0   Years of education: Not on file   Highest education level: Not on file  Occupational History   Occupation: Conservation officer, nature    Employer: [employer]  Tobacco Use   Smoking status: Never   Smokeless tobacco: Former    Types: Chew    Quit date: 05/01/1993  Vaping Use   Vaping Use: Never used  Substance and Sexual Activity   Alcohol use: Yes    Alcohol/week: 8.0 standard drinks of alcohol    Types: 4 Cans of beer, 4 Shots of liquor per week    Comment: BEER AND LIQUOR   Drug use: No   Sexual activity: Not Currently  Other Topics Concern   Not on file  Social History Narrative   Exercise swimming 3-5 times/week for 45 minutes   Social Determinants of Health   Financial Resource Strain: Not on file  Food  Insecurity: Not on file  Transportation Needs: Not on file  Physical Activity: Not on file  Stress: Not on file  Social Connections: Not on file  Intimate Partner Violence: Not on file    Observations/Objective: T 97.8. weight 346.2 today.  Nontoxic appearance on video.  Speaking full sentences.  Cough a few times during visit.  No audible wheeze or stridor.  Coherent responses.  All questions answered with understanding of plan expressed.  Assessment and Plan: Acute sinusitis, recurrence not specified, unspecified location - Plan: amoxicillin-clavulanate (AUGMENTIN) 875-125 MG tablet Likely initial viral syndrome with secondary bacterial sinusitis.  Start Augmentin, has tolerated previously, possible side effects were discussed.  Continue saline nasal spray, Atrovent nasal spray if needed.  Fluids, rest.  RTC/ER precautions given.  Follow Up Instructions: As needed   I discussed the assessment and treatment plan with the patient. The patient was provided an opportunity to ask questions and all were answered. The patient agreed with the plan and demonstrated an understanding of the instructions.   The patient was advised to call back or seek an in-person evaluation if the symptoms worsen or if the condition fails to improve as anticipated.   [name], MD

## 2022-06-19 NOTE — Patient Instructions (Signed)
Sorry to hear you are sick.  I do think you have a sinus infection.  Augmentin should help.  See other information below.  Continue saline nasal spray, Atrovent nasal spray if needed, fluids and rest.  Hope you feel better soon.   Sinus Infection, Adult A sinus infection, also called sinusitis, is inflammation of your sinuses. Sinuses are hollow spaces in the bones around your face. Your sinuses are located: Around your eyes. In the middle of your forehead. Behind your nose. In your cheekbones. Mucus normally drains out of your sinuses. When your nasal tissues become inflamed or swollen, mucus can become trapped or blocked. This allows bacteria, viruses, and fungi to grow, which leads to infection. Most infections of the sinuses are caused by a virus. A sinus infection can develop quickly. It can last for up to 4 weeks (acute) or for more than 12 weeks (chronic). A sinus infection often develops after a cold. What are the causes? This condition is caused by anything that creates swelling in the sinuses or stops mucus from draining. This includes: Allergies. Asthma. Infection from bacteria or viruses. Deformities or blockages in your nose or sinuses. Abnormal growths in the nose (nasal polyps). Pollutants, such as chemicals or irritants in the air. Infection from fungi. This is rare. What increases the risk? You are more likely to develop this condition if you: Have a weak body defense system (immune system). Do a lot of swimming or diving. Overuse nasal sprays. Smoke. What are the signs or symptoms? The main symptoms of this condition are pain and a feeling of pressure around the affected sinuses. Other symptoms include: Stuffy nose or congestion that makes it difficult to breathe through your nose. Thick yellow or greenish drainage from your nose. Tenderness, swelling, and warmth over the affected sinuses. A cough that may get worse at night. Decreased sense of smell and taste. Extra  mucus that collects in the throat or the back of the nose (postnasal drip) causing a sore throat or bad breath. Tiredness (fatigue). Fever. How is this diagnosed? This condition is diagnosed based on: Your symptoms. Your medical history. A physical exam. Tests to find out if your condition is acute or chronic. This may include: Checking your nose for nasal polyps. Viewing your sinuses using a device that has a light (endoscope). Testing for allergies or bacteria. Imaging tests, such as an MRI or CT scan. In rare cases, a bone biopsy may be done to rule out more serious types of fungal sinus disease. How is this treated? Treatment for a sinus infection depends on the cause and whether your condition is chronic or acute. If caused by a virus, your symptoms should go away on their own within 10 days. You may be given medicines to relieve symptoms. They include: Medicines that shrink swollen nasal passages (decongestants). A spray that eases inflammation of the nostrils (topical intranasal corticosteroids). Rinses that help get rid of thick mucus in your nose (nasal saline washes). Medicines that treat allergies (antihistamines). Over-the-counter pain relievers. If caused by bacteria, your health care provider may recommend waiting to see if your symptoms improve. Most bacterial infections will get better without antibiotic medicine. You may be given antibiotics if you have: A severe infection. A weak immune system. If caused by narrow nasal passages or nasal polyps, surgery may be needed. Follow these instructions at home: Medicines Take, use, or apply over-the-counter and prescription medicines only as told by your health care provider. These may include nasal sprays. If  you were prescribed an antibiotic medicine, take it as told by your health care provider. Do not stop taking the antibiotic even if you start to feel better. Hydrate and humidify  Drink enough fluid to keep your urine  pale yellow. Staying hydrated will help to thin your mucus. Use a cool mist humidifier to keep the humidity level in your home above 50%. Inhale steam for 10-15 minutes, 3-4 times a day, or as told by your health care provider. You can do this in the bathroom while a hot shower is running. Limit your exposure to cool or dry air. Rest Rest as much as possible. Sleep with your head raised (elevated). Make sure you get enough sleep each night. General instructions  Apply a warm, moist washcloth to your face 3-4 times a day or as told by your health care provider. This will help with discomfort. Use nasal saline washes as often as told by your health care provider. Wash your hands often with soap and water to reduce your exposure to germs. If soap and water are not available, use hand sanitizer. Do not smoke. Avoid being around people who are smoking (secondhand smoke). Keep all follow-up visits. This is important. Contact a health care provider if: You have a fever. Your symptoms get worse. Your symptoms do not improve within 10 days. Get help right away if: You have a severe headache. You have persistent vomiting. You have severe pain or swelling around your face or eyes. You have vision problems. You develop confusion. Your neck is stiff. You have trouble breathing. These symptoms may be an emergency. Get help right away. Call 911. Do not wait to see if the symptoms will go away. Do not drive yourself to the hospital. Summary A sinus infection is soreness and inflammation of your sinuses. Sinuses are hollow spaces in the bones around your face. This condition is caused by nasal tissues that become inflamed or swollen. The swelling traps or blocks the flow of mucus. This allows bacteria, viruses, and fungi to grow, which leads to infection. If you were prescribed an antibiotic medicine, take it as told by your health care provider. Do not stop taking the antibiotic even if you start to  feel better. Keep all follow-up visits. This is important. This information is not intended to replace advice given to you by your health care provider. Make sure you discuss any questions you have with your health care provider. Document Revised: 03/22/2021 Document Reviewed: 03/22/2021 Elsevier Patient Education  [place].

## 2022-07-19 ENCOUNTER — Ambulatory Visit (INDEPENDENT_AMBULATORY_CARE_PROVIDER_SITE_OTHER): Payer: BC Managed Care – PPO | Admitting: Family Medicine

## 2022-07-19 ENCOUNTER — Encounter: Payer: Self-pay | Admitting: Family Medicine

## 2022-07-19 DIAGNOSIS — E785 Hyperlipidemia, unspecified: Secondary | ICD-10-CM | POA: Diagnosis not present

## 2022-07-19 DIAGNOSIS — E1165 Type 2 diabetes mellitus with hyperglycemia: Secondary | ICD-10-CM

## 2022-07-19 LAB — LIPID PANEL
Cholesterol: 177 mg/dL (ref 0–200)
HDL: 54.1 mg/dL (ref >39.00)
LDL Cholesterol: 91 mg/dL (ref 0–99)
NonHDL: 122.48
Total CHOL/HDL Ratio: 3
Triglycerides: 157 mg/dL — ABNORMAL HIGH (ref 0.0–149.0)
VLDL: 31.4 mg/dL (ref 0.0–40.0)

## 2022-07-19 LAB — HEMOGLOBIN A1C: Hgb A1c MFr Bld: 9.1 % — ABNORMAL HIGH (ref 4.6–6.5)

## 2022-07-19 LAB — COMPREHENSIVE METABOLIC PANEL
ALT: 39 U/L (ref 0–53)
AST: 30 U/L (ref 0–37)
Albumin: 4.4 g/dL (ref 3.5–5.2)
Alkaline Phosphatase: 110 U/L (ref 39–117)
BUN: 17 mg/dL (ref 6–23)
CO2: 29 mEq/L (ref 19–32)
Calcium: 9.4 mg/dL (ref 8.4–10.5)
Chloride: 100 mEq/L (ref 96–112)
Creatinine, Ser: 0.78 mg/dL (ref 0.40–1.50)
GFR: 95.88 mL/min (ref >60.00)
Glucose, Bld: 188 mg/dL — ABNORMAL HIGH (ref 70–99)
Potassium: 4.2 mEq/L (ref 3.5–5.1)
Sodium: 137 mEq/L (ref 135–145)
Total Bilirubin: 0.6 mg/dL (ref 0.2–1.2)
Total Protein: 7.4 g/dL (ref 6.0–8.3)

## 2022-07-19 MED ORDER — ATORVASTATIN CALCIUM 10 MG PO TABS: 10.0000 mg | ORAL_TABLET | Freq: Every day | ORAL | 3 refills | Status: DC

## 2022-07-19 MED ORDER — GLIPIZIDE 5 MG PO TABS: 5.0000 mg | ORAL_TABLET | Freq: Every day | ORAL | 3 refills | Status: DC

## 2022-07-19 MED ORDER — METFORMIN HCL 1000 MG PO TABS: ORAL_TABLET | ORAL | 1 refills | Status: DC

## 2022-07-19 MED ORDER — TRULICITY 4.5 MG/0.5ML ~~LOC~~ SOAJ: SUBCUTANEOUS | 2 refills | Status: DC

## 2022-07-19 NOTE — Progress Notes (Signed)
Subjective:  Patient ID: Henry Hinton., male    DOB: [birth year]  Age: 62 y.o. MRN: [id number]  CC:  Chief Complaint  Patient presents with   Hyperlipidemia   Hypertension   Diabetes    HPI [facility] Depaul Jackovich. presents for   Diabetes: With hyperglycemia.  Treated with Trulicity 4.5 mg weekly, ozempic availability was issue. metformin 1000 mg twice daily, he is on statin with Lipitor 10 mg daily. Jardiance previously, cramping became an issue, improved off that med - not 100% sure it was related to activity. Still very active until June, would consider trying jardiance again in June. Would like to try low dose glipizide for now.  Home readings: Fasting: around 142  2hr PP: 160.  No sx lows.  Microalbumin: Normal ratio 12/09/2021 Optho, foot exam, pneumovax: Up-to-date  Lab Results  Component Value Date   HGBA1C 9.1 (H) 07/19/2022   HGBA1C 8.4 (H) 04/05/2022   HGBA1C 8.1 (H) 12/09/2021   Lab Results  Component Value Date   MICROALBUR 1.2 12/09/2021   LDLCALC 91 07/19/2022   CREATININE 0.78 07/19/2022   Hyperlipidemia: Lipitor 10 mg QOD. No new myalgias.  LFTs noted from January but no recent lipid panel. Chronic pedal edema, treated with furosemide, potassium supplementation. 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   History of DVT (deep vein thrombosis) 08/19/2019   Venous stasis 08/19/2019   Essential hypertension 08/19/2019   Acute hypoxemic respiratory failure due to COVID-19 [facility])  05/18/2019   BPH without obstruction/lower urinary tract symptoms 03/14/2018   Family history of prostate cancer 03/14/2018   History of nephrolithiasis 03/14/2018   Microscopic hematuria 03/14/2018   Diabetes mellitus without complication ([place]) 123456   Iron deficiency anemia 11/19/2017   Influenza, pneumonia 09/22/2017   Abdominal wall cellulitis 01/22/2017   Insect bite 01/22/2017   Hyperkalemia 01/22/2017   OSA (obstructive sleep apnea) 01/22/2017   Vocal cord paralysis 01/31/2016   Hypercalciuria 01/10/2016   Cellulitis 11/28/2015   Cellulitis of trunk 11/27/2015   Nonspecific abnormal finding in stool contents    Colon cancer screening    Sepsis ([place]) 10/09/2015   Panniculitis 10/09/2015   CLL (chronic lymphocytic leukemia) ([place]) 05/27/2015   Calculi, ureter 10/27/2014   Calculus of kidney 10/27/2014   Leukocytosis (leucocytosis) 08/29/2014   HNP (herniated nucleus pulposus), cervical 01/21/2013   bilat hip replacement 04/08/2012   Morbid obesity ([place]) 04/08/2012   Edema 04/08/2012    Past Medical History:  Diagnosis Date   Anemia    Arthritis    BMI 50.0-59.9, adult ([place])    Cellulitis 11/2015   trunk   cll 11/2014   CLL - 11/30/2014- Dr. [facility] [place], [place]   DVT (deep venous thrombosis) ([place]) 6/16   Rt calf  took xarelto for 6 months   Family history of anesthesia complication    " father having delirium after anesthesia"   Heart murmur    Hx; of  as a child   History of kidney stones    x2   Joint pain    Hx: of periodically   Numbness and tingling    Hx: of in right arm   Pneumonia    Sleep apnea    Cpap use- settings 10      Review of Systems  Constitutional:  Negative for fatigue and unexpected weight change.  Eyes:  Negative for visual disturbance.  Respiratory:  Negative for cough, chest tightness and shortness of breath.   Cardiovascular:  Negative for chest pain, palpitations and leg swelling.  Gastrointestinal:   Negative for abdominal pain and blood in stool.  Neurological:  Negative for dizziness, light-headedness and headaches.     Objective:   Vitals:   07/19/22 0830  BP: 128/72  Pulse: 84  Temp: 97.9 F (36.6 C)  TempSrc: Temporal  SpO2: 96%  Weight: (!) 347 lb 6.4 oz (157.6 kg)  Height: 5\' 9"  (1.753 m)     Physical Exam Vitals reviewed.  Constitutional:      Appearance: He is well-developed.  HENT:     Head: Normocephalic and atraumatic.  Neck:     Vascular: No carotid bruit or JVD.  Cardiovascular:     Rate and Rhythm: Normal rate and regular rhythm.     Heart sounds: Normal heart sounds. No murmur heard. Pulmonary:     Effort: Pulmonary effort is normal.     Breath sounds: Normal breath sounds. No rales.  Musculoskeletal:     Right lower leg: No edema.     Left lower leg: No edema.  Skin:    General: Skin is warm and dry.  Neurological:     Mental Status: He is alert and oriented to person, place, and time.  Psychiatric:        Mood and Affect: Mood normal.     Assessment & Plan:  [name]. is a 62 y.o. male . Hyperlipidemia, unspecified hyperlipidemia type Assessment & Plan: Tolerating every other day statin, check labs.  Medication adjustment accordingly based on results.  Orders: -     Atorvastatin Calcium; Take 1 tablet (10 mg total) by mouth daily.  Dispense: 90 tablet; Refill: 3 -     Comprehensive metabolic panel -     Lipid panel  Type 2 diabetes mellitus with hyperglycemia, without long-term current use of insulin (HCC) Assessment & Plan: Check A1c.  For now we will try low-dose glipizide, with potential side effects, risk of hypoglycemia discussed.  Considering restarting Jardiance, but would like to wait until next visit, change in work schedule.  Continue metformin, Trulicity same doses for now.  Orders: -     Trulicity; Inject 4.5mg  [place] once weekly.  Dispense: 2 mL; Refill: 2 -     metFORMIN HCl; TAKE 1 TABLET BY MOUTH 2 TIMES DAILY  WITH A MEAL.  Dispense: 180 tablet; Refill: 1 -     Hemoglobin A1c -     glipiZIDE; Take 1 tablet (5 mg total) by mouth daily before breakfast.  Dispense: 30 tablet; Refill: 3    Patient Instructions  I would consider restarting Jardiance, but can wait until June if needed. Can start low dose glipizide for now. No change in other meds for now.       Signed,   [name], MD [place] 07/19/22 10:00 PM

## 2022-07-19 NOTE — Assessment & Plan Note (Addendum)
Tolerating every other day statin, check labs.  Medication adjustment accordingly based on results.

## 2022-07-19 NOTE — Assessment & Plan Note (Signed)
Check A1c.  For now we will try low-dose glipizide, with potential side effects, risk of hypoglycemia discussed.  Considering restarting Jardiance, but would like to wait until next visit, change in work schedule.  Continue metformin, Trulicity same doses for now.

## 2022-07-19 NOTE — Patient Instructions (Addendum)
I would consider restarting Jardiance, but can wait until June if needed. Can start low dose glipizide for now. No change in other meds for now.

## 2022-07-25 ENCOUNTER — Encounter: Payer: Self-pay | Admitting: Family Medicine

## 2022-08-17 ENCOUNTER — Other Ambulatory Visit: Payer: Self-pay | Admitting: Family Medicine

## 2022-08-17 DIAGNOSIS — E1165 Type 2 diabetes mellitus with hyperglycemia: Secondary | ICD-10-CM

## 2022-08-19 ENCOUNTER — Other Ambulatory Visit: Payer: Self-pay | Admitting: Family Medicine

## 2022-08-19 DIAGNOSIS — E1165 Type 2 diabetes mellitus with hyperglycemia: Secondary | ICD-10-CM

## 2022-08-21 ENCOUNTER — Other Ambulatory Visit (HOSPITAL_BASED_OUTPATIENT_CLINIC_OR_DEPARTMENT_OTHER): Payer: Self-pay

## 2022-08-21 MED ORDER — TRULICITY 4.5 MG/0.5ML ~~LOC~~ SOAJ
4.5000 mg | SUBCUTANEOUS | 1 refills | Status: DC
  Filled 2022-08-21: qty 6, 84d supply, fill #0
  Filled 2022-08-22: qty 2, 28d supply, fill #0
  Filled 2022-08-22: qty 6, 84d supply, fill #0
  Filled 2022-09-16: qty 2, 28d supply, fill #1

## 2022-08-21 NOTE — Telephone Encounter (Signed)
Please send to [facility] pharmacy for pt trying to pick up today due to shortages

## 2022-08-21 NOTE — Telephone Encounter (Signed)
Pharmacy states the medication Trulicity is on backorder is there an alternative we can send in for pt ?

## 2022-08-22 ENCOUNTER — Other Ambulatory Visit (HOSPITAL_BASED_OUTPATIENT_CLINIC_OR_DEPARTMENT_OTHER): Payer: Self-pay

## 2022-08-22 ENCOUNTER — Encounter: Payer: Self-pay | Admitting: Family Medicine

## 2022-09-27 ENCOUNTER — Encounter: Payer: Self-pay | Admitting: Family Medicine

## 2022-09-28 MED ORDER — TRULICITY 4.5 MG/0.5ML ~~LOC~~ SOAJ
4.5000 mg | SUBCUTANEOUS | 1 refills | Status: DC
  Filled 2022-09-28: qty 2, 28d supply, fill #0
  Filled 2022-11-04: qty 2, 28d supply, fill #1
  Filled 2022-12-02: qty 2, 28d supply, fill #2

## 2022-09-28 NOTE — Telephone Encounter (Signed)
Ordered to [facility].

## 2022-09-28 NOTE — Telephone Encounter (Signed)
Patient is requesting a refill of the following medications: Requested Prescriptions   Pending Prescriptions Disp Refills   Dulaglutide (TRULICITY) 4.5 MG/0.5ML SOPN 6 mL 1    Sig: Inject 4.5 mg as directed once a week.    Date of patient request: 09/28/2022 Last office visit: 07/19/22 Date of last refill: 08/21/2022 Last refill amount: 6mL Follow up time period per chart: 6 months

## 2022-09-29 ENCOUNTER — Encounter (HOSPITAL_BASED_OUTPATIENT_CLINIC_OR_DEPARTMENT_OTHER): Payer: Self-pay | Admitting: Pharmacist

## 2022-09-29 ENCOUNTER — Other Ambulatory Visit (HOSPITAL_BASED_OUTPATIENT_CLINIC_OR_DEPARTMENT_OTHER): Payer: Self-pay

## 2022-10-12 ENCOUNTER — Other Ambulatory Visit (HOSPITAL_BASED_OUTPATIENT_CLINIC_OR_DEPARTMENT_OTHER): Payer: Self-pay

## 2022-10-30 ENCOUNTER — Ambulatory Visit: Payer: BC Managed Care – PPO | Admitting: Family Medicine

## 2022-11-06 ENCOUNTER — Ambulatory Visit: Payer: BC Managed Care – PPO | Admitting: Family Medicine

## 2022-11-17 ENCOUNTER — Other Ambulatory Visit: Payer: Self-pay | Admitting: Family Medicine

## 2022-11-17 DIAGNOSIS — R600 Localized edema: Secondary | ICD-10-CM

## 2022-11-27 ENCOUNTER — Other Ambulatory Visit: Payer: Self-pay | Admitting: Family Medicine

## 2022-11-27 DIAGNOSIS — R600 Localized edema: Secondary | ICD-10-CM

## 2022-11-29 ENCOUNTER — Encounter (INDEPENDENT_AMBULATORY_CARE_PROVIDER_SITE_OTHER): Payer: Self-pay

## 2022-12-12 ENCOUNTER — Telehealth (INDEPENDENT_AMBULATORY_CARE_PROVIDER_SITE_OTHER): Payer: BC Managed Care – PPO | Admitting: Adult Health

## 2022-12-12 DIAGNOSIS — G4733 Obstructive sleep apnea (adult) (pediatric): Secondary | ICD-10-CM

## 2022-12-12 NOTE — Progress Notes (Signed)
PATIENT: Henry Hinton. DOB: [birth year]  REASON FOR VISIT: follow up HISTORY FROM: patient PRIMARY NEUROLOGIST:   Virtual Visit via Video Note  I connected with Henry Hinton. on 12/12/22 at  1:00 PM EDT by a video enabled telemedicine application located remotely at [facility] and verified that I am speaking with the correct person using two identifiers who was located at their own home.   I discussed the limitations of evaluation and management by telemedicine and the availability of in person appointments. The patient expressed understanding and agreed to proceed.   PATIENT: Henry Hinton. DOB: [birth year]  REASON FOR VISIT: follow up HISTORY FROM: patient  HISTORY OF PRESENT ILLNESS: Today 12/12/22:  Henry Hinton. is a 62 y.o. male with a history of OSA on CPAP. Returns today for follow-up.  Overall the CPAP is working well.  Occasionally he will have soreness around nose.  Currently wearing the nasal pillows.  He states that he will try different size.  If this does not work we can consider a DreamWear mask.  His download is below        REVIEW OF SYSTEMS: Out of a complete 14 system review of symptoms, the patient complains only of the following symptoms, and all other reviewed systems are negative.  ALLERGIES: Allergies  Allergen Reactions   Adhesive [Tape] Other (See Comments)    Blisters and rash at site   Other     HOME MEDICATIONS: Outpatient Medications Prior to Visit  Medication Sig Dispense Refill   Ascorbic Acid (VITAMIN C) 1000 MG tablet Take 1,000 mg by mouth daily.     aspirin EC 81 MG tablet Take 81 mg by mouth daily.     atorvastatin (LIPITOR) 10 MG tablet Take 1 tablet (10 mg total) by mouth daily. 90 tablet 3   Dulaglutide (TRULICITY) 4.5 MG/0.5ML SOPN Inject 4.5 mg as directed once a week. 6 mL 1   ferrous sulfate 325 (65 FE) MG tablet Take 325 mg by mouth daily with breakfast.     fluticasone  (FLONASE) 50 MCG/ACT nasal spray Place 2 sprays into both nostrils daily. 16 g 0   furosemide (LASIX) 80 MG tablet TAKE 1 TABLET BY MOUTH 2 TIMES DAILY. 180 tablet 1   glipiZIDE (GLUCOTROL) 5 MG tablet Take 1 tablet (5 mg total) by mouth daily before breakfast. 30 tablet 3   ibuprofen (ADVIL,MOTRIN) 200 MG tablet Take 600 mg by mouth every 6 (six) hours as needed for mild pain.     ipratropium (ATROVENT) 0.06 % nasal spray Place 1-2 sprays into both nostrils 4 (four) times daily. As needed for nasal congestion 15 mL 5   ipratropium (ATROVENT) 0.06 % nasal spray Place into the nose.     KLOR-CON M20 20 MEQ tablet TAKE 1 TABLET BY MOUTH TWICE A DAY 180 tablet 1   meloxicam (MOBIC) 15 MG tablet Take 15 mg by mouth daily.     metFORMIN (GLUCOPHAGE) 1000 MG tablet TAKE 1 TABLET BY MOUTH 2 TIMES DAILY WITH A MEAL. 180 tablet 1   Multiple Vitamins-Minerals (MULTIVITAMIN WITH MINERALS) tablet Take 1 tablet by mouth daily.     Zinc 15 MG CAPS daily.     No facility-administered medications prior to visit.    PAST MEDICAL HISTORY: Past Medical History:  Diagnosis Date   Anemia    Arthritis    BMI 50.0-59.9, adult (HCC)    Cellulitis 11/2015   trunk  cll 11/2014   CLL - 11/30/2014- Dr. Riki [facility] [place], [place]   DVT (deep venous thrombosis) (HCC) 6/16   Rt calf  took xarelto for 6 months   Family history of anesthesia complication    " father having delirium after anesthesia"   Heart murmur    Hx; of as a child   History of kidney stones    x2   Joint pain    Hx: of periodically   Numbness and tingling    Hx: of in right arm   Pneumonia    Sleep apnea    Cpap use- settings 10    PAST SURGICAL HISTORY: Past Surgical History:  Procedure Laterality Date   BACK SURGERY     COLONOSCOPY     Hx: of   COLONOSCOPY N/A 11/23/2015   Procedure: COLONOSCOPY;  Surgeon: Hilarie Fredrickson, MD;  [facility]: [facility];  Service: Endoscopy;  Laterality: N/A;   HERNIA REPAIR N/A     Phreesia 03/19/2020   INGUINAL HERNIA REPAIR Left 06/01/2015   Procedure: LEFT INGUINAL HERNIA REPAIR WITH MESH;  Surgeon: Chevis Pretty III, MD;  Location: [facility];  Service: General;  Laterality: Left;   INGUINAL HERNIA REPAIR     INSERTION OF MESH Left 06/01/2015   Procedure: INSERTION OF MESH;  Surgeon: Chevis Pretty III, MD;  Location: [facility];  Service: General;  Laterality: Left;   JOINT REPLACEMENT Bilateral    BTHA   KNEE ARTHROSCOPY Left 07/21/2014   Dr. Thurston Hole   LUMBAR LAMINECTOMY  2005   POSTERIOR CERVICAL LAMINECTOMY WITH MET- RX Right 01/21/2013   Procedure: Right Sitting C6-7 Microdiskectomy with Met-rex;  Surgeon: Barnett Abu, MD;  [facility];  Service: Neurosurgery;  Laterality: Right;  Right Sitting C6-7 Microdiskectomy with Met-rex   SPINE SURGERY N/A    Phreesia 03/19/2020   STENT PLACEMENT RT URETER ([facility] HX)  10/23/2014   tenosynovitis  2000   Hx: of thumb   TONSILLECTOMY     TOTAL HIP ARTHROPLASTY Bilateral    WISDOM TOOTH EXTRACTION  1974   all 4    FAMILY HISTORY: Family History  Problem Relation Age of Onset   Diabetes Father    Prostate cancer Father        PROSTATE   COPD Maternal Grandmother    Heart disease Maternal Grandfather    Lung cancer Paternal Grandfather        LUNG   Sleep apnea Neg Hx     SOCIAL HISTORY: Social History   Socioeconomic History   Marital status: Divorced    Spouse name: Not on file   Number of children: 0   Years of education: Not on file   Highest education level: Not on file  Occupational History   Occupation: Dentist    Employer: [employer]  Tobacco Use   Smoking status: Never   Smokeless tobacco: Former    Types: Chew    Quit date: 05/01/1993  Vaping Use   Vaping status: Never Used  Substance and Sexual Activity   Alcohol use: Yes    Alcohol/week: 8.0 standard drinks of alcohol    Types: 4 Cans of beer, 4 Shots of liquor per week    Comment: BEER AND LIQUOR   Drug use:  No   Sexual activity: Not Currently  Other Topics Concern   Not on file  Social History Narrative   Exercise swimming 3-5 times/week for 45 minutes   Social Determinants of Health  Financial Resource Strain: Not on file  Food Insecurity: Not on file  Transportation Needs: Not on file  Physical Activity: Not on file  Stress: Not on file  Social Connections: Not on file  Intimate Partner Violence: Not on file      PHYSICAL EXAM Generalized: Well developed, in no acute distress   Neurological examination  Mentation: Alert oriented to time, place, history taking. Follows all commands speech and language fluent Cranial nerve II-XII: Facial symmetry noted  DIAGNOSTIC DATA (LABS, IMAGING, TESTING) - I reviewed patient records, labs, notes, testing and imaging myself where available.  Lab Results  Component Value Date   WBC 75.0 (HH) 05/21/2019   HGB 11.3 (L) 05/21/2019   HCT 37.8 (L) 05/21/2019   MCV 89.2 05/21/2019   PLT 207 05/21/2019      Component Value Date/Time   NA 137 07/19/2022 0917   NA 138 03/18/2020 0809   K 4.2 07/19/2022 0917   CL 100 07/19/2022 0917   CO2 29 07/19/2022 0917   GLUCOSE 188 (H) 07/19/2022 0917   BUN 17 07/19/2022 0917   BUN 13 03/18/2020 0809   CREATININE 0.78 07/19/2022 0917   CREATININE 0.71 02/24/2016 1603   CALCIUM 9.4 07/19/2022 0917   PROT 7.4 07/19/2022 0917   PROT 6.4 03/18/2020 0809   ALBUMIN 4.4 07/19/2022 0917   ALBUMIN 4.1 03/18/2020 0809   AST 30 07/19/2022 0917   ALT 39 07/19/2022 0917   ALKPHOS 110 07/19/2022 0917   BILITOT 0.6 07/19/2022 0917   BILITOT 0.4 03/18/2020 0809   GFRNONAA 100 03/18/2020 0809   GFRNONAA >89 02/24/2016 1603   GFRAA 116 03/18/2020 0809   GFRAA >89 02/24/2016 1603   Lab Results  Component Value Date   CHOL 177 07/19/2022   HDL 54.10 07/19/2022   LDLCALC 91 07/19/2022   TRIG 157.0 (H) 07/19/2022   CHOLHDL 3 07/19/2022   Lab Results  Component Value Date   HGBA1C 9.1 (H) 07/19/2022    No results found for: "VITAMINB12" Lab Results  Component Value Date   TSH 3.270 03/18/2020      ASSESSMENT AND PLAN 62 y.o. year old male  has a past medical history of Anemia, Arthritis, BMI 50.0-59.9, adult (HCC), Cellulitis (11/2015), cll (11/2014), DVT (deep venous thrombosis) (HCC) (6/16), Family history of anesthesia complication, Heart murmur, History of kidney stones, Joint pain, Numbness and tingling, Pneumonia, and Sleep apnea. here with:  OSA on CPAP  CPAP compliance excellent Residual AHI is good Encouraged patient to continue using CPAP nightly and > 4 hours each night Advised that he can try different size mask if that is not helpful we can consider DreamWear mask F/U in 1 year or sooner if needed    Butch Penny, MSN, NP-C 12/12/2022, 12:19 PM [facility] [address] [place], [place] [address] [phone]

## 2022-12-13 ENCOUNTER — Ambulatory Visit: Payer: BC Managed Care – PPO | Admitting: Family Medicine

## 2022-12-20 ENCOUNTER — Encounter: Payer: Self-pay | Admitting: Family Medicine

## 2022-12-20 ENCOUNTER — Ambulatory Visit: Payer: BC Managed Care – PPO | Admitting: Family Medicine

## 2022-12-20 VITALS — BP 134/70 | HR 69 | Temp 98.9°F | Ht 69.0 in | Wt 350.0 lb

## 2022-12-20 DIAGNOSIS — E1165 Type 2 diabetes mellitus with hyperglycemia: Secondary | ICD-10-CM | POA: Diagnosis not present

## 2022-12-20 DIAGNOSIS — Z7985 Long-term (current) use of injectable non-insulin antidiabetic drugs: Secondary | ICD-10-CM

## 2022-12-20 DIAGNOSIS — E785 Hyperlipidemia, unspecified: Secondary | ICD-10-CM

## 2022-12-20 LAB — COMPREHENSIVE METABOLIC PANEL
ALT: 40 U/L (ref 0–53)
AST: 24 U/L (ref 0–37)
Albumin: 4.2 g/dL (ref 3.5–5.2)
Alkaline Phosphatase: 122 U/L — ABNORMAL HIGH (ref 39–117)
BUN: 17 mg/dL (ref 6–23)
CO2: 28 mEq/L (ref 19–32)
Calcium: 8.7 mg/dL (ref 8.4–10.5)
Chloride: 99 mEq/L (ref 96–112)
Creatinine, Ser: 0.72 mg/dL (ref 0.40–1.50)
GFR: 97.94 mL/min (ref >60.00)
Glucose, Bld: 231 mg/dL — ABNORMAL HIGH (ref 70–99)
Potassium: 4.2 mEq/L (ref 3.5–5.1)
Sodium: 138 mEq/L (ref 135–145)
Total Bilirubin: 0.7 mg/dL (ref 0.2–1.2)
Total Protein: 6.7 g/dL (ref 6.0–8.3)

## 2022-12-20 LAB — HEMOGLOBIN A1C: Hgb A1c MFr Bld: 9.9 % — ABNORMAL HIGH (ref 4.6–6.5)

## 2022-12-20 LAB — LIPID PANEL
Cholesterol: 186 mg/dL (ref 0–200)
HDL: 49.7 mg/dL (ref >39.00)
LDL Cholesterol: 100 mg/dL — ABNORMAL HIGH (ref 0–99)
NonHDL: 135.98
Total CHOL/HDL Ratio: 4
Triglycerides: 181 mg/dL — ABNORMAL HIGH (ref 0.0–149.0)
VLDL: 36.2 mg/dL (ref 0.0–40.0)

## 2022-12-20 LAB — MICROALBUMIN / CREATININE URINE RATIO
Creatinine,U: 167.8 mg/dL
Microalb Creat Ratio: 0.5 mg/g (ref 0.0–30.0)
Microalb, Ur: 0.8 mg/dL (ref 0.0–1.9)

## 2022-12-20 NOTE — Progress Notes (Signed)
Subjective:  Patient ID: Henry Quam., male    DOB: 03-06-1961  Age: 62 y.o. MRN: [id number]  CC:  Chief Complaint  Patient presents with   Medical Management of Chronic Issues    Pt notes doing okay    HPI Henry Hinton. presents for   Diabetes: Uncontrolled with hyperglycemia.  Previously treated with Ozempic, availability was initially.  Then Trulicity 4.5 mg weekly, along with metformin 1000 mg twice daily.  Had stopped Jardiance due to possible cramping as side effect, considered restart - has not restarted. Has been recommended to restart by endocrine as well.   Initially low-dose glipizide added at his March visit.  Greggory Keen has been discussed but needed to have failed other option first for coverage. Fasting 140's.  Postprandial average 160-170, 200's when in [place] in Jun more alcohol and dietary indiscretion for 10 days, improved diet since. Busy this summer - less swimming. Some water runs. Plans on increased exercise. Retired - prn work.  No symptomatic lows.  Saw nephrology in May, referred to endocrinology - appt 02/07/23 - Dr. Roosevelt Locks Microalbumin: Ordered today.  Previously normal in August 2023 Optho, foot exam, pneumovax: Up-to-date. Diabetic Foot Exam - Simple   Simple Foot Form Visual Inspection No deformities, no ulcerations, no other skin breakdown bilaterally: Yes Sensation Testing Intact to touch and monofilament testing bilaterally: Yes Pulse Check Posterior Tibialis and Dorsalis pulse intact bilaterally: Yes Comments Normal visual exam and no concerns expressed by patient for today      Lab Results  Component Value Date   HGBA1C 9.1 (H) 07/19/2022   HGBA1C 8.4 (H) 04/05/2022   HGBA1C 8.1 (H) 12/09/2021   Lab Results  Component Value Date   MICROALBUR 1.2 12/09/2021   LDLCALC 91 07/19/2022   CREATININE 0.78 07/19/2022   Hyperlipidemia: Lipitor 10 mg 4 times weekly. Fating today.  Lab Results  Component Value Date   CHOL  177 07/19/2022   HDL 54.10 07/19/2022   LDLCALC 91 07/19/2022   TRIG 157.0 (H) 07/19/2022   CHOLHDL 3 07/19/2022   Lab Results  Component Value Date   ALT 39 07/19/2022   AST 30 07/19/2022   ALKPHOS 110 07/19/2022   BILITOT 0.6 07/19/2022   History of CLL, recent visit on August 15 with his oncologist.  Counts were stable, no B symptoms.  No adenopathy.  6 week recheck for bloodwork. OV in 3 months.    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   History of DVT (deep vein thrombosis) 08/19/2019   Venous stasis 08/19/2019   Essential hypertension 08/19/2019   Acute hypoxemic respiratory failure due to COVID-19 [facility]) 05/18/2019   BPH without obstruction/lower urinary tract symptoms 03/14/2018   Family history of prostate cancer 03/14/2018   History of nephrolithiasis 03/14/2018   Microscopic hematuria 03/14/2018   Diabetes mellitus without complication (HCC) 11/19/2017   Iron deficiency anemia 11/19/2017   Influenza, pneumonia 09/22/2017   Abdominal wall cellulitis 01/22/2017   Insect bite 01/22/2017   Hyperkalemia 01/22/2017   OSA (obstructive sleep apnea) 01/22/2017   Vocal cord paralysis 01/31/2016   Hypercalciuria 01/10/2016   Cellulitis 11/28/2015   Cellulitis of trunk 11/27/2015   Nonspecific abnormal finding in stool contents    Colon cancer screening    Sepsis (HCC) 10/09/2015   Panniculitis 10/09/2015  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 Riki [facility] [place], [place]   DVT (deep venous thrombosis) (HCC) 6/16   Rt calf  took xarelto for 6 months   Family history of anesthesia complication    " father having delirium after anesthesia"   Heart murmur    Hx; of as a child   History of kidney stones    x2   Joint pain    Hx: of periodically   Numbness and tingling    Hx: of in right arm   Pneumonia    Sleep apnea    Cpap use- settings 10   Past Surgical History:  Procedure Laterality Date   BACK SURGERY     COLONOSCOPY     Hx: of   COLONOSCOPY N/A 11/23/2015   Procedure: COLONOSCOPY;  Surgeon: Hilarie Fredrickson, MD;  [facility];  Service: Endoscopy;  Laterality: N/A;   HERNIA REPAIR N/A    Phreesia 03/19/2020   INGUINAL HERNIA REPAIR Left 06/01/2015   Procedure: LEFT INGUINAL HERNIA REPAIR WITH MESH;  Surgeon: Chevis Pretty III, MD;  [facility];  Service: General;  Laterality: Left;   INGUINAL HERNIA REPAIR     INSERTION OF MESH Left 06/01/2015   Procedure: INSERTION OF MESH;  Surgeon: Chevis Pretty III, MD;  [facility];  Service: General;  Laterality: Left;   JOINT REPLACEMENT Bilateral    BTHA   KNEE ARTHROSCOPY Left 07/21/2014   Dr. Thurston Hole   LUMBAR LAMINECTOMY  2005   POSTERIOR CERVICAL LAMINECTOMY WITH MET- RX Right 01/21/2013   Procedure: Right Sitting C6-7 Microdiskectomy with Met-rex;  Surgeon: Barnett Abu, MD;  [facility];  Service: Neurosurgery;  Laterality: Right;  Right Sitting C6-7 Microdiskectomy with Met-rex   SPINE SURGERY N/A    Phreesia 03/19/2020   STENT PLACEMENT RT URETER ([facility] HX)  10/23/2014   tenosynovitis  2000   Hx: of thumb   TONSILLECTOMY     TOTAL HIP ARTHROPLASTY Bilateral    WISDOM TOOTH EXTRACTION  1974   all 4   Allergies  Allergen Reactions   Adhesive [Tape] Other (See Comments)    Blisters and rash at site   Other    Prior to Admission medications    Medication Sig Start Date End Date Taking? Authorizing Provider  Ascorbic Acid (VITAMIN C) 1000 MG tablet Take 1,000 mg by mouth daily.   Yes [provider]  aspirin EC 81 MG tablet Take 81 mg by mouth daily.   Yes [provider]  atorvastatin (LIPITOR) 10 MG tablet Take 1 tablet (10 mg total) by mouth daily. 07/19/22  Yes Shade Flood, MD  Dulaglutide (TRULICITY) 4.5 MG/0.5ML SOPN Inject 4.5 mg as directed once a week. 09/28/22  Yes Shade Flood, MD  ferrous sulfate 325 (65 FE) MG tablet Take 325 mg by mouth daily with breakfast.   Yes [provider]  fluticasone (FLONASE) 50 MCG/ACT nasal spray Place 2 sprays into both nostrils daily. 05/19/21  Yes Freddy Finner, NP  furosemide (LASIX) 80 MG tablet TAKE 1 TABLET BY MOUTH 2 TIMES DAILY. 11/27/22  Yes Shade Flood, MD  ibuprofen (ADVIL,MOTRIN) 200 MG tablet Take 600 mg by mouth every 6 (six) hours as needed for mild pain.   Yes [provider]  ipratropium (ATROVENT) 0.06 % nasal spray Place 1-2 sprays into both nostrils 4 (four) times daily. As needed for nasal congestion 09/13/21  Yes Shade Flood, MD  ipratropium (ATROVENT) 0.06 % nasal spray Place into the nose. 09/13/21  Yes [provider]  KLOR-CON M20 20 MEQ tablet TAKE 1 TABLET BY MOUTH TWICE A DAY 11/17/22  Yes Shade Flood, MD  meloxicam (MOBIC) 15 MG tablet Take 15 mg by mouth daily. 07/20/21  Yes [provider]  metFORMIN (GLUCOPHAGE) 1000 MG tablet TAKE 1 TABLET BY MOUTH 2 TIMES DAILY WITH A MEAL. 07/19/22  Yes Shade Flood, MD  Multiple Vitamins-Minerals (MULTIVITAMIN WITH MINERALS) tablet Take 1 tablet by mouth daily.   Yes [provider]  Zinc 15 MG CAPS daily. 05/22/19  Yes [provider]   Social History   Socioeconomic History   Marital status: Divorced    Spouse name: Not on file   Number of children: 0   Years of education: Not on file   Highest education level: Master's  degree (e.g., MA, MS, MEng, MEd, MSW, MBA)  Occupational History   Occupation: Psychologist, counselling: [employer]  Tobacco Use   Smoking status: Never   Smokeless tobacco: Former    Types: Chew    Quit date: 05/01/1993  Vaping Use   Vaping status: Never Used  Substance and Sexual Activity   Alcohol use: Yes    Alcohol/week: 8.0 standard drinks of alcohol    Types: 4 Cans of beer, 4 Shots of liquor per week    Comment: BEER AND LIQUOR   Drug use: No   Sexual activity: Not Currently  Other Topics Concern   Not on file  Social History Narrative   Exercise swimming 3-5 times/week for 45 minutes   Social Determinants of Health   Financial Resource Strain: Low Risk  (12/16/2022)   Overall Financial Resource Strain (CARDIA)    Difficulty of Paying Living Expenses: Not hard at all  Food Insecurity: No Food Insecurity (12/16/2022)   Hunger Vital Sign    Worried About Running Out of Food in the Last Year: Never true    Ran Out of Food in the Last Year: Never true  Transportation Needs: No Transportation Needs (12/16/2022)   PRAPARE - Administrator, Civil Service (Medical): No    Lack of Transportation (Non-Medical): No  Physical Activity: Sufficiently Active (12/16/2022)   Exercise Vital Sign    Days of Exercise per Week: 4 days    Minutes of Exercise per Session: 70 min  Stress: No Stress Concern Present (12/16/2022)   [employer] of Occupational Health - Occupational Stress Questionnaire    Feeling of Stress : Not at all  Social Connections: Moderately Integrated (12/16/2022)   Social Connection and Isolation Panel [NHANES]    Frequency of Communication with Friends and Family: More than three times a week    Frequency of Social Gatherings with Friends and Family: More than three times a week    Attends Religious Services: More than 4 times per year    Active Member of [employer] or Organizations: Yes    Attends Banker Meetings:  More than 4 times  per year    Marital Status: Divorced  Catering manager Violence: Not on file    Review of Systems  Constitutional:  Negative for fatigue and unexpected weight change.  Eyes:  Negative for visual disturbance.  Respiratory:  Negative for cough, chest tightness and shortness of breath.   Cardiovascular:  Negative for chest pain, palpitations and leg swelling.  Gastrointestinal:  Negative for abdominal pain and blood in stool.  Neurological:  Negative for dizziness, light-headedness and headaches.     Objective:   Vitals:   12/20/22 0900  BP: 134/70  Pulse: 69  Temp: 98.9 F (37.2 C)  TempSrc: Temporal  SpO2: 95%  Weight: (!) 350 lb (158.8 kg)  Height: 5\' 9"  (1.753 m)     Physical Exam Vitals reviewed.  Constitutional:      Appearance: He is well-developed.  HENT:     Head: Normocephalic and atraumatic.  Neck:     Vascular: No carotid bruit or JVD.  Cardiovascular:     Rate and Rhythm: Normal rate and regular rhythm.     Heart sounds: Normal heart sounds. No murmur heard. Pulmonary:     Effort: Pulmonary effort is normal.     Breath sounds: Normal breath sounds. No rales.  Musculoskeletal:     Right lower leg: No edema.     Left lower leg: No edema.  Skin:    General: Skin is warm and dry.  Neurological:     Mental Status: He is alert and oriented to person, place, and time.  Psychiatric:        Mood and Affect: Mood normal.     Assessment & Plan:  Henry Klebe. is a 62 y.o. male . Type 2 diabetes mellitus with hyperglycemia, without long-term current use of insulin (HCC) - Plan: Comprehensive metabolic panel, Hemoglobin A1c, Microalbumin / creatinine urine ratio  -Uncontrolled with previous hyperglycemia.  Plan is to restart Jardiance, on max dose Trulicity.  Option to switch to [facility] depending on A1c level.  He is not interested in daily insulin at this time.  Continue metformin same dose for now.  Recent nephrology eval and  referral to endocrinology, appointment scheduled in October.  Will adjust regimen until that time.   Hyperlipidemia, unspecified hyperlipidemia type - Plan: Comprehensive metabolic panel, Lipid panel  -Tolerating current dose Lipitor, consider higher dosing depending on readings with goal under 70 LDL.  No orders of the defined types were placed in this encounter.  Patient Instructions  Restart jardiance. No other med changes.  I will check A1c and can see if changes needed prior to meeting with endocrinology. One option would be change to [facility] - may be option now that you have been on Trulicity.  Thanks for coming in today.      Signed,   Meredith Staggers, MD [facility] 12/20/22 9:27 AM

## 2022-12-20 NOTE — Patient Instructions (Addendum)
Restart jardiance. No other med changes.  I will check A1c and can see if changes needed prior to meeting with endocrinology. One option would be change to [facility] - may be option now that you have been on Trulicity.  Thanks for coming in today.

## 2022-12-21 ENCOUNTER — Encounter: Payer: Self-pay | Admitting: Family Medicine

## 2022-12-29 ENCOUNTER — Encounter: Payer: Self-pay | Admitting: Family Medicine

## 2023-01-02 ENCOUNTER — Ambulatory Visit (INDEPENDENT_AMBULATORY_CARE_PROVIDER_SITE_OTHER): Payer: BC Managed Care – PPO

## 2023-01-02 DIAGNOSIS — Z111 Encounter for screening for respiratory tuberculosis: Secondary | ICD-10-CM

## 2023-01-02 NOTE — Progress Notes (Signed)
Patient presented today for TB skin test placement; placed Right arm midline halfway down; good wheel formation; patient was advised not to touch the area and to return after 8:40a on Thursday and before 8:50a on Friday for result to be read.

## 2023-01-04 ENCOUNTER — Ambulatory Visit (INDEPENDENT_AMBULATORY_CARE_PROVIDER_SITE_OTHER): Payer: BC Managed Care – PPO

## 2023-01-04 NOTE — Telephone Encounter (Signed)
Paperwork completed based on his exam on 12/20/2022.  No apparent limitations. Updated flu vaccine recommended -can have performed at our office or pharmacy if he would prefer.  Please complete read portion of PPD section then paperwork ready for pickup. Paperwork completed and placed in fax bin at back nurse station  Immunization History  Administered Date(s) Administered   Influenza,inj,Quad PF,6+ Mos 02/27/2017, 03/04/2018, 03/20/2019, 03/14/2021, 04/05/2022   Influenza-Unspecified 02/27/2017, 03/04/2018, 03/20/2019   Janssen (J&J) SARS-COV-2 Vaccination 12/06/2019   PPD Test 01/02/2023   Pneumococcal Polysaccharide-23 03/04/2018   Tdap 07/31/2006, 02/27/2017

## 2023-01-04 NOTE — Progress Notes (Signed)
Patient presented today for TB reading so we can complete his form and email it back to him as it is due on Monday  Email to [email or web address] once complete

## 2023-01-18 ENCOUNTER — Encounter: Payer: Self-pay | Admitting: Family Medicine

## 2023-01-18 DIAGNOSIS — E1165 Type 2 diabetes mellitus with hyperglycemia: Secondary | ICD-10-CM

## 2023-01-18 MED ORDER — TIRZEPATIDE 5 MG/0.5ML ~~LOC~~ SOAJ: 5.0000 mg | SUBCUTANEOUS | 1 refills | Status: DC

## 2023-01-18 NOTE — Telephone Encounter (Signed)
Pt is asking about his prescription switch to [facility], please advise and I can let patient know it has been sent in

## 2023-01-31 ENCOUNTER — Other Ambulatory Visit: Payer: Self-pay

## 2023-01-31 DIAGNOSIS — E1165 Type 2 diabetes mellitus with hyperglycemia: Secondary | ICD-10-CM

## 2023-02-01 ENCOUNTER — Other Ambulatory Visit (INDEPENDENT_AMBULATORY_CARE_PROVIDER_SITE_OTHER): Payer: BC Managed Care – PPO

## 2023-02-01 DIAGNOSIS — E1165 Type 2 diabetes mellitus with hyperglycemia: Secondary | ICD-10-CM

## 2023-02-01 LAB — LIPID PANEL
Cholesterol: 176 mg/dL (ref 0–200)
HDL: 45.2 mg/dL (ref >39.00)
LDL Cholesterol: 93 mg/dL (ref 0–99)
NonHDL: 130.59
Total CHOL/HDL Ratio: 4
Triglycerides: 187 mg/dL — ABNORMAL HIGH (ref 0.0–149.0)
VLDL: 37.4 mg/dL (ref 0.0–40.0)

## 2023-02-01 LAB — COMPREHENSIVE METABOLIC PANEL
ALT: 34 U/L (ref 0–53)
AST: 23 U/L (ref 0–37)
Albumin: 3.9 g/dL (ref 3.5–5.2)
Alkaline Phosphatase: 105 U/L (ref 39–117)
BUN: 20 mg/dL (ref 6–23)
CO2: 28 meq/L (ref 19–32)
Calcium: 8.3 mg/dL — ABNORMAL LOW (ref 8.4–10.5)
Chloride: 104 meq/L (ref 96–112)
Creatinine, Ser: 0.92 mg/dL (ref 0.40–1.50)
GFR: 89.1 mL/min (ref >60.00)
Glucose, Bld: 247 mg/dL — ABNORMAL HIGH (ref 70–99)
Potassium: 4.6 meq/L (ref 3.5–5.1)
Sodium: 139 meq/L (ref 135–145)
Total Bilirubin: 0.5 mg/dL (ref 0.2–1.2)
Total Protein: 6.1 g/dL (ref 6.0–8.3)

## 2023-02-01 LAB — MICROALBUMIN / CREATININE URINE RATIO
Creatinine,U: 51.9 mg/dL
Microalb Creat Ratio: 1.3 mg/g (ref 0.0–30.0)
Microalb, Ur: <0.7 mg/dL (ref 0.0–1.9)

## 2023-02-01 LAB — HEMOGLOBIN A1C: Hgb A1c MFr Bld: 9.5 % — ABNORMAL HIGH (ref 4.6–6.5)

## 2023-02-06 ENCOUNTER — Other Ambulatory Visit (HOSPITAL_BASED_OUTPATIENT_CLINIC_OR_DEPARTMENT_OTHER): Payer: Self-pay

## 2023-02-06 MED ORDER — TIRZEPATIDE 5 MG/0.5ML ~~LOC~~ SOAJ
5.0000 mg | SUBCUTANEOUS | 1 refills | Status: DC
  Filled 2023-02-06: qty 2, 28d supply, fill #0
  Filled 2023-03-04: qty 2, 28d supply, fill #1

## 2023-02-06 NOTE — Addendum Note (Signed)
Addended by: Meredith Staggers R on: 02/06/2023 12:02 PM   Modules accepted: Orders

## 2023-02-07 ENCOUNTER — Ambulatory Visit: Payer: BC Managed Care – PPO | Admitting: "Endocrinology

## 2023-02-07 ENCOUNTER — Encounter (HOSPITAL_BASED_OUTPATIENT_CLINIC_OR_DEPARTMENT_OTHER): Payer: Self-pay

## 2023-02-07 ENCOUNTER — Other Ambulatory Visit (HOSPITAL_BASED_OUTPATIENT_CLINIC_OR_DEPARTMENT_OTHER): Payer: Self-pay

## 2023-02-07 ENCOUNTER — Encounter: Payer: BC Managed Care – PPO | Attending: "Endocrinology | Admitting: Nutrition

## 2023-02-07 ENCOUNTER — Encounter: Payer: Self-pay | Admitting: "Endocrinology

## 2023-02-07 VITALS — BP 132/84 | HR 57 | Resp 20 | Ht 69.0 in | Wt 341.8 lb

## 2023-02-07 DIAGNOSIS — E782 Mixed hyperlipidemia: Secondary | ICD-10-CM

## 2023-02-07 DIAGNOSIS — Z7985 Long-term (current) use of injectable non-insulin antidiabetic drugs: Secondary | ICD-10-CM

## 2023-02-07 DIAGNOSIS — E1165 Type 2 diabetes mellitus with hyperglycemia: Secondary | ICD-10-CM | POA: Diagnosis not present

## 2023-02-07 DIAGNOSIS — Z794 Long term (current) use of insulin: Secondary | ICD-10-CM

## 2023-02-07 DIAGNOSIS — Z7984 Long term (current) use of oral hypoglycemic drugs: Secondary | ICD-10-CM

## 2023-02-07 MED ORDER — DEXCOM G7 SENSOR MISC
1.0000 | 0 refills | Status: DC
Start: 2023-02-07 — End: 2023-05-18
  Filled 2023-02-07: qty 3, 30d supply, fill #0
  Filled 2023-03-04 – 2023-03-22 (×3): qty 3, 30d supply, fill #1
  Filled 2023-04-17: qty 3, 30d supply, fill #2

## 2023-02-07 MED ORDER — LANTUS SOLOSTAR 100 UNIT/ML ~~LOC~~ SOPN
10.0000 [IU] | PEN_INJECTOR | Freq: Every day | SUBCUTANEOUS | 3 refills | Status: DC
  Filled 2023-02-07: qty 15, 38d supply, fill #0

## 2023-02-07 NOTE — Progress Notes (Signed)
Outpatient Endocrinology Note [place], MD  02/07/23   Henry Hinton 06-20-1960 [id number]  Referring Provider: Virgina Norfolk, PA Primary Care Provider: Shade Flood, MD Reason for consultation: Subjective   Assessment & Plan  Diagnoses and all orders for this visit:  Uncontrolled type 2 diabetes mellitus with hyperglycemia (HCC) -     Ambulatory referral to diabetic education  Long term (current) use of oral hypoglycemic drugs  Long-term (current) use of injectable non-insulin antidiabetic drugs  Mixed hypercholesterolemia and hypertriglyceridemia  Other orders -     insulin glargine (LANTUS SOLOSTAR) 100 UNIT/ML Solostar Pen; Inject 10 Units into the skin daily. + sliding scale, max dose 40 units a day -     Continuous Glucose Sensor (DEXCOM G7 SENSOR) MISC; 1 Device by Does not apply route continuous.    Diabetes Type II complicated by hyperglycemia,  Lab Results  Component Value Date   GFR 89.10 02/01/2023   Hba1c goal less than 7, current Hba1c is  Lab Results  Component Value Date   HGBA1C 9.5 (H) 02/01/2023   Will recommend the following: Jardiance 10 mg qd Metformin 1000mg  bid Mounjaro 5 mg/week Start Lantus 10 units once every morning. Increase by 1 units a every day until fasting blood sugar is less than 120. Stay on that dose.    No known contraindications/side effects to any of above medications  -Last LD and Tg are as follows: Lab Results  Component Value Date   LDLCALC 93 02/01/2023    Lab Results  Component Value Date   TRIG 187.0 (H) 02/01/2023   -On atorvastatin 10 mg QD -Follow low fat diet and exercise   -Blood pressure goal <140/90 - Microalbumin/creatinine goal is < 30 -Last MA/Cr is as follows: Lab Results  Component Value Date   MICROALBUR <0.7 02/01/2023   -not on ACE/ARB  -diet changes including salt restriction -limit eating outside -counseled BP targets per standards of diabetes  care -uncontrolled blood pressure can lead to retinopathy, nephropathy and cardiovascular and atherosclerotic heart disease  Reviewed and counseled on: -A1C target -Blood sugar targets -Complications of uncontrolled diabetes  -Checking blood sugar before meals and bedtime and bring log next visit -All medications with mechanism of action and side effects -Hypoglycemia management: rule of 15's, Glucagon Emergency Kit and medical alert ID -low-carb low-fat plate-method diet -At least 20 minutes of physical activity per day -Annual dilated retinal eye exam and foot exam -compliance and follow up needs -follow up as scheduled or earlier if problem gets worse  Call if blood sugar is less than 70 or consistently above 250    Take a 15 gm snack of carbohydrate at bedtime before you go to sleep if your blood sugar is less than 100.    If you are going to fast after midnight for a test or procedure, ask your physician for instructions on how to reduce/decrease your insulin dose.    Call if blood sugar is less than 70 or consistently above 250  -Treating a low sugar by rule of 15  (15 gms of sugar every 15 min until sugar is more than 70) If you feel your sugar is low, test your sugar to be sure If your sugar is low (less than 70), then take 15 grams of a fast acting Carbohydrate (3-4 glucose tablets or glucose gel or 4 ounces of juice or regular soda) Recheck your sugar 15 min after treating low to make sure it is more than 70  If sugar is still less than 70, treat again with 15 grams of carbohydrate          Don't drive the hour of hypoglycemia  If unconscious/unable to eat or drink by mouth, use glucagon injection or nasal spray baqsimi and call 911. Can repeat again in 15 min if still unconscious.  Return in about 29 days (around 03/08/2023).   I have reviewed current medications, nurse's notes, allergies, vital signs, past medical and surgical history, family medical history, and social  history for this encounter. Counseled patient on symptoms, examination findings, lab findings, imaging results, treatment decisions and monitoring and prognosis. The patient understood the recommendations and agrees with the treatment plan. All questions regarding treatment plan were fully answered.  Alt[place]e [place], MD  02/07/23    History of Present Illness Henry Ray Earl Parmley. is a 62 y.o. year old male who presents for follow up for Type II diabetes mellitus.  Henry Ray Jefrey Maddaloni. was first diagnosed in 2018.   Diabetes education -  Home diabetes regimen: Jardiance 10 mg every day Metformin 1000mg  bid Trulicity 4.5 mg weekly   COMPLICATIONS -  MI/Stroke -  retinopathy -  neuropathy -  nephropathy  SYMPTOMS REVIEWED - Polyuria - Weight loss - Blurred vision  BLOOD SUGAR DATA Checks once a week Did not bring meter Per recall, 220-240 two hours after BF  Physical Exam  BP 132/84 (BP Location: Left Arm, Patient Position: Sitting, Cuff Size: Large)   Pulse (!) 57   Resp 20   Ht 5\' 9"  (1.753 m)   Wt (!) 341 lb 12.8 oz (155 kg)   SpO2 97%   BMI 50.48 kg/m    Constitutional: well developed, well nourished Head: normocephalic, atraumatic Eyes: sclera anicteric, no redness Neck: supple Lungs: normal respiratory effort Neurology: alert and oriented Skin: dry, no appreciable rashes Musculoskeletal: no appreciable defects Psychiatric: normal mood and affect Diabetic Foot Exam - Simple   No data filed      Current Medications Patient's Medications  New Prescriptions   CONTINUOUS GLUCOSE SENSOR (DEXCOM G7 SENSOR) MISC    1 Device by Does not apply route continuous.   INSULIN GLARGINE (LANTUS SOLOSTAR) 100 UNIT/ML SOLOSTAR PEN    Inject 10 Units into the skin daily. + sliding scale, max dose 40 units a day  Previous Medications   ASCORBIC ACID (VITAMIN C) 1000 MG TABLET    Take 1,000 mg by mouth daily.   ASPIRIN EC 81 MG TABLET    Take 81 mg by mouth daily.    ATORVASTATIN (LIPITOR) 10 MG TABLET    Take 1 tablet (10 mg total) by mouth daily.   FERROUS SULFATE 325 (65 FE) MG TABLET    Take 325 mg by mouth daily with breakfast.   FLUTICASONE (FLONASE) 50 MCG/ACT NASAL SPRAY    Place 2 sprays into both nostrils daily.   FUROSEMIDE (LASIX) 80 MG TABLET    TAKE 1 TABLET BY MOUTH 2 TIMES DAILY.   IBUPROFEN (ADVIL,MOTRIN) 200 MG TABLET    Take 600 mg by mouth every 6 (six) hours as needed for mild pain.   IPRATROPIUM (ATROVENT) 0.06 % NASAL SPRAY    Place 1-2 sprays into both nostrils 4 (four) times daily. As needed for nasal congestion   IPRATROPIUM (ATROVENT) 0.06 % NASAL SPRAY    Place into the nose.   KLOR-CON M20 20 MEQ TABLET    TAKE 1 TABLET BY MOUTH TWICE A DAY   MELOXICAM (MOBIC) 15  MG TABLET    Take 15 mg by mouth daily.   METFORMIN (GLUCOPHAGE) 1000 MG TABLET    TAKE 1 TABLET BY MOUTH 2 TIMES DAILY WITH A MEAL.   MULTIPLE VITAMINS-MINERALS (MULTIVITAMIN WITH MINERALS) TABLET    Take 1 tablet by mouth daily.   TIRZEPATIDE (MOUNJARO) 5 MG/0.5ML PEN    Inject 5 mg into the skin once a week.   ZINC 15 MG CAPS    daily.  Modified Medications   No medications on file  Discontinued Medications   No medications on file    Allergies Allergies  Allergen Reactions   Adhesive [Tape] Other (See Comments)    Blisters and rash at site   Other     Past Medical History Past Medical History:  Diagnosis Date   Anemia    Arthritis    BMI 50.0-59.9, adult (HCC)    Cellulitis 11/2015   trunk   cll 11/2014   CLL - 11/30/2014- Dr. Riki [facility] [place], [place]   DVT (deep venous thrombosis) (HCC) 6/16   Rt calf  took xarelto for 6 months   Family history of anesthesia complication    " father having delirium after anesthesia"   Heart murmur    Hx; of as a child   History of kidney stones    x2   Joint pain    Hx: of periodically   Numbness and tingling    Hx: of in right arm   Pneumonia    Sleep apnea    Cpap use- settings 10     Past Surgical History Past Surgical History:  Procedure Laterality Date   BACK SURGERY     COLONOSCOPY     Hx: of   COLONOSCOPY N/A 11/23/2015   Procedure: COLONOSCOPY;  Surgeon: Hilarie Fredrickson, MD;  Location: [facility];  Service: Endoscopy;  Laterality: N/A;   HERNIA REPAIR N/A    [employer] 03/19/2020   INGUINAL HERNIA REPAIR Left 06/01/2015   Procedure: LEFT INGUINAL HERNIA REPAIR WITH MESH;  Surgeon: Chevis Pretty III, MD;  Location: [facility];  Service: General;  Laterality: Left;   INGUINAL HERNIA REPAIR     INSERTION OF MESH Left 06/01/2015   Procedure: INSERTION OF MESH;  Surgeon: Chevis Pretty III, MD;  Location: [facility];  Service: General;  Laterality: Left;   JOINT REPLACEMENT Bilateral    BTHA   KNEE ARTHROSCOPY Left 07/21/2014   Dr. Thurston Hole   LUMBAR LAMINECTOMY  2005   POSTERIOR CERVICAL LAMINECTOMY WITH MET- RX Right 01/21/2013   Procedure: Right Sitting C6-7 Microdiskectomy with Met-rex;  Surgeon: Barnett Abu, MD;  Location: [facility];  Service: Neurosurgery;  Laterality: Right;  Right Sitting C6-7 Microdiskectomy with Met-rex   SPINE SURGERY N/A    [employer]reesia 03/19/2020   STENT PLACEMENT RT URETER ([facility] HX)  10/23/2014   tenosynovitis  2000   Hx: of thumb   TONSILLECTOMY     TOTAL HIP ARTHROPLASTY Bilateral    WISDOM TOOTH EXTRACTION  1974   all 4    Family History family history includes COPD in his maternal grandmother; Diabetes in his father; Heart disease in his maternal grandfather; Lung cancer in his paternal grandfather; Prostate cancer in his father.  Social History Social History   Socioeconomic History   Marital status: Divorced    Spouse name: Not on file   Number of children: 0   Years of education: Not on file   Highest education level: Master's degree (e.g., MA, MS, MEng, MEd,  MSW, MBA)  Occupational History   Occupation: Psychologist, counselling: [employer]  Tobacco Use   Smoking status: Never   Smokeless tobacco: Former     Types: Chew    Quit date: 05/01/1993  Vaping Use   Vaping status: Never Used  Substance and Sexual Activity   Alcohol use: Yes    Alcohol/week: 8.0 standard drinks of alcohol    Types: 4 Cans of beer, 4 Shots of liquor per week    Comment: BEER AND LIQUOR   Drug use: No   Sexual activity: Not Currently  Other Topics Concern   Not on file  Social History Narrative   Exercise swimming 3-5 times/week for 45 minutes   Social Determinants of Health   Financial Resource Strain: Low Risk  (12/16/2022)   Overall Financial Resource Strain (CARDIA)    Difficulty of Paying Living Expenses: Not hard at all  Food Insecurity: No Food Insecurity (12/16/2022)   Hunger Vital Sign    Worried About Running Out of Food in the Last Year: Never true    Ran Out of Food in the Last Year: Never true  Transportation Needs: No Transportation Needs (12/16/2022)   PRAPARE - Administrator, Civil Service (Medical): No    Lack of Transportation (Non-Medical): No  Physical Activity: Sufficiently Active (12/16/2022)   Exercise Vital Sign    Days of Exercise per Week: 4 days    Minutes of Exercise per Session: 70 min  Stress: No Stress Concern Present (12/16/2022)   [employer] of Occupational Health - Occupational Stress Questionnaire    Feeling of Stress : Not at all  Social Connections: Moderately Integrated (12/16/2022)   Social Connection and Isolation Panel [NHANES]    Frequency of Communication with Friends and Family: More than three times a week    Frequency of Social Gatherings with Friends and Family: More than three times a week    Attends Religious Services: More than 4 times per year    Active Member of Clubs or Organizations: Yes    Attends Banker Meetings: More than 4 times per year    Marital Status: Divorced  Intimate Partner Violence: Not on file    Lab Results  Component Value Date   HGBA1C 9.5 (H) 02/01/2023   HGBA1C 9.9 (H) 12/20/2022   HGBA1C 9.1 (H)  07/19/2022   Lab Results  Component Value Date   CHOL 176 02/01/2023   Lab Results  Component Value Date   HDL 45.20 02/01/2023   Lab Results  Component Value Date   LDLCALC 93 02/01/2023   Lab Results  Component Value Date   TRIG 187.0 (H) 02/01/2023   Lab Results  Component Value Date   CHOLHDL 4 02/01/2023   Lab Results  Component Value Date   CREATININE 0.92 02/01/2023   Lab Results  Component Value Date   GFR 89.10 02/01/2023   Lab Results  Component Value Date   MICROALBUR <0.7 02/01/2023      Component Value Date/Time   NA 139 02/01/2023 0905   NA 138 03/18/2020 0809   K 4.6 02/01/2023 0905   CL 104 02/01/2023 0905   CO2 28 02/01/2023 0905   GLUCOSE 247 (H) 02/01/2023 0905   BUN 20 02/01/2023 0905   BUN 13 03/18/2020 0809   CREATININE 0.92 02/01/2023 0905   CREATININE 0.71 02/24/2016 1603   CALCIUM 8.3 (L) 02/01/2023 0905   PROT 6.1 02/01/2023 0905   PROT 6.4  03/18/2020 0809   ALBUMIN 3.9 02/01/2023 0905   ALBUMIN 4.1 03/18/2020 0809   AST 23 02/01/2023 0905   ALT 34 02/01/2023 0905   ALKPHOS 105 02/01/2023 0905   BILITOT 0.5 02/01/2023 0905   BILITOT 0.4 03/18/2020 0809   GFRNONAA 100 03/18/2020 0809   GFRNONAA >89 02/24/2016 1603   GFRAA 116 03/18/2020 0809   GFRAA >89 02/24/2016 1603      Latest Ref Rng & Units 02/01/2023    9:05 AM 12/20/2022    9:41 AM 07/19/2022    9:17 AM  BMP  Glucose 70 - 99 mg/dL 960  454  098   BUN 6 - 23 mg/dL 20  17  17    Creatinine 0.40 - 1.50 mg/dL 1.19  1.47  8.29   Sodium 135 - 145 mEq/L 139  138  137   Potassium 3.5 - 5.1 mEq/L 4.6  4.2  4.2   Chloride 96 - 112 mEq/L 104  99  100   CO2 19 - 32 mEq/L 28  28  29    Calcium 8.4 - 10.5 mg/dL 8.3  8.7  9.4        Component Value Date/Time   WBC 75.0 (HH) 05/21/2019 0640   RBC 4.24 05/21/2019 0640   HGB 11.3 (L) 05/21/2019 0640   HCT 37.8 (L) 05/21/2019 0640   PLT 207 05/21/2019 0640   MCV 89.2 05/21/2019 0640   MCV 83.7 12/10/2017 1608   MCH 26.7  05/21/2019 0640   MCHC 29.9 (L) 05/21/2019 0640   RDW 15.1 05/21/2019 0640   LYMPHSABS 68.2 (H) 05/21/2019 0640   MONOABS 0.8 05/21/2019 0640   EOSABS 0.0 05/21/2019 0640   BASOSABS 0.1 05/21/2019 0640     Parts of this note may have been dictated using voice recognition software. There may be variances in spelling and vocabulary which are unintentional. Not all errors are proofread. Please notify the Thereasa Parkin if any discrepancies are noted or if the meaning of any statement is not clear.

## 2023-02-07 NOTE — Patient Instructions (Addendum)
Will recommend the following: Jardiance 10 mg qd Metformin 1000mg  bid Trulicity 4.5 mg every day, now changing on mounjaro 5 mg/week Start Lantus 10 units once every morning. Increase by 1 unit every day until fasting blood sugar is less than 120. Stay on that dose.    ____________   Goals of DM therapy:  Morning Fasting blood sugar: 80-140  Blood sugar before meals: 80-140 Bed time blood sugar: 100-150  A1C <7%, limited only by hypoglycemia  1.Diabetes medications and their side effects discussed, including hypoglycemia    2. Check blood glucose:  a) Always check blood sugars before driving. Please see below (under hypoglycemia) on how to manage b) Check a minimum of 3 times/day or more as needed when having symptoms of hypoglycemia.   c) Try to check blood glucose before sleeping/in the middle of the night to ensure that it is remaining stable and not dropping less than 100 d) Check blood glucose more often if sick  3. Diet: a) 3 meals per day schedule b: Restrict carbs to 60-70 grams (4 servings) per meal c) Colorful vegetables - 3 servings a day, and low sugar fruit 2 servings/day Plate control method: 1/4 plate protein, 1/4 starch, 1/2 green, yellow, or red vegetables d) Avoid carbohydrate snacks unless hypoglycemic episode, or increased physical activity  4. Regular exercise as tolerated, preferably 3 or more hours a week  5. Hypoglycemia: a)  Do not drive or operate machinery without first testing blood glucose to assure it is over 90 mg%, or if dizzy, lightheaded, not feeling normal, etc, or  if foot or leg is numb or weak. b)  If blood glucose less than 70, take four 5gm Glucose tabs or 15-30 gm Glucose gel.  Repeat every 15 min as needed until blood sugar is >100 mg/dl. If hypoglycemia persists then call 911.   6. Sick day management: a) Check blood glucose more often b) Continue usual therapy if blood sugars are elevated.   7. Contact the doctor immediately if  blood glucose is frequently <60 mg/dl, or an episode of severe hypoglycemia occurs (where someone had to give you glucose/  glucagon or if you passed out from a low blood glucose), or if blood glucose is persistently >350 mg/dl, for further management  8. A change in level of physical activity or exercise and a change in diet may also affect your blood sugar. Check blood sugars more often and call if needed.  Instructions: 1. Bring glucose meter, blood glucose records on every visit for review 2. Continue to follow up with primary care physician and other providers for medical care 3. Yearly eye  and foot exam 4. Please get blood work done prior to the next appointment

## 2023-02-07 NOTE — Progress Notes (Signed)
Patient was trained on the use of the Lantus insulin pen.  We discussed how to use the pen: do an air shot, attach the needle, where and when to inject, need to rotate injection sites, how to inject, and how to remove the needle.  We also discussed how to adjust the insulin dose per Dr. Bronwen Betters orders, and he reported good understanding of all of the above, We also discussed low blood sugars-symptoms, and treatment.  He was reminded to call the office when this occurs to discuss need to reduce dose.   He had no final questions. Brochure on starting insulin given to him with phone number to call if questions. Patient was also trained on the used of the Dexcom G7 sensor.  He  was given a sample: lot #   Exp. 5/25.  This was placed

## 2023-02-07 NOTE — Patient Instructions (Addendum)
Take 10 units of insulin once a day as directed Increase dose of Lantus by 1u every day until morning blood sugar is below 120.   Call office if blood sugars drop below 70.

## 2023-02-08 ENCOUNTER — Telehealth: Payer: Self-pay | Admitting: Dietician

## 2023-02-08 ENCOUNTER — Other Ambulatory Visit (HOSPITAL_BASED_OUTPATIENT_CLINIC_OR_DEPARTMENT_OTHER): Payer: Self-pay

## 2023-02-08 ENCOUNTER — Other Ambulatory Visit: Payer: Self-pay

## 2023-02-08 ENCOUNTER — Encounter: Payer: Self-pay | Admitting: "Endocrinology

## 2023-02-08 DIAGNOSIS — E1165 Type 2 diabetes mellitus with hyperglycemia: Secondary | ICD-10-CM

## 2023-02-08 MED ORDER — PEN NEEDLES 31G X 6 MM MISC
1.0000 | Freq: Every day | 0 refills | Status: AC
Start: 2023-02-08 — End: ?
  Filled 2023-02-08: qty 100, 100d supply, fill #0
  Filled 2023-03-04: qty 100, 30d supply, fill #0

## 2023-02-08 MED ORDER — LANTUS SOLOSTAR 100 UNIT/ML ~~LOC~~ SOPN: 10.0000 [IU] | PEN_INJECTOR | Freq: Every day | SUBCUTANEOUS | 3 refills | Status: DC

## 2023-02-08 MED ORDER — PEN NEEDLES 31G X 6 MM MISC: 1.0000 | Freq: Every day | 0 refills | Status: DC

## 2023-02-08 NOTE — Telephone Encounter (Signed)
Pen Needles have been sent to the pharmacy

## 2023-02-08 NOTE — Telephone Encounter (Signed)
Returned patient call. He has an insulin pen but needs a script for needles.  Will send a request to clinical team.  Oran Rein, RD, LDN, CDCES

## 2023-02-12 ENCOUNTER — Encounter: Payer: Self-pay | Admitting: "Endocrinology

## 2023-02-12 ENCOUNTER — Other Ambulatory Visit (HOSPITAL_BASED_OUTPATIENT_CLINIC_OR_DEPARTMENT_OTHER): Payer: Self-pay

## 2023-02-13 ENCOUNTER — Other Ambulatory Visit: Payer: Self-pay

## 2023-02-13 ENCOUNTER — Other Ambulatory Visit: Payer: Self-pay | Admitting: "Endocrinology

## 2023-02-13 DIAGNOSIS — E1165 Type 2 diabetes mellitus with hyperglycemia: Secondary | ICD-10-CM

## 2023-02-13 MED ORDER — DEXCOM G7 RECEIVER DEVI
1.0000 | 0 refills | Status: DC
Start: 2023-02-13 — End: 2023-02-15

## 2023-02-15 ENCOUNTER — Other Ambulatory Visit: Payer: Self-pay | Admitting: "Endocrinology

## 2023-02-15 ENCOUNTER — Telehealth: Payer: Self-pay

## 2023-02-15 DIAGNOSIS — Z794 Long term (current) use of insulin: Secondary | ICD-10-CM

## 2023-02-15 MED ORDER — DEXCOM G7 RECEIVER DEVI: 0 refills | Status: DC

## 2023-02-15 NOTE — Telephone Encounter (Signed)
Per Henry Hinton his insurance requires prior auth on his Dexcom G7 sensors

## 2023-02-16 ENCOUNTER — Other Ambulatory Visit (HOSPITAL_BASED_OUTPATIENT_CLINIC_OR_DEPARTMENT_OTHER): Payer: Self-pay

## 2023-02-19 ENCOUNTER — Other Ambulatory Visit (HOSPITAL_COMMUNITY): Payer: Self-pay

## 2023-02-19 ENCOUNTER — Telehealth: Payer: Self-pay

## 2023-02-19 NOTE — Telephone Encounter (Signed)
Pharmacy Patient Advocate Encounter   Received notification from Pt Calls Messages that prior authorization for Dexcom G7 sensor is required/requested.  Per test claim: PA required; PA submitted to [facility] via CoverMyMeds Key/confirmation #/EOC [id number] Status is pending

## 2023-02-20 ENCOUNTER — Other Ambulatory Visit (HOSPITAL_BASED_OUTPATIENT_CLINIC_OR_DEPARTMENT_OTHER): Payer: Self-pay

## 2023-02-23 NOTE — Telephone Encounter (Signed)
Pharmacy Patient Advocate Encounter  Received notification from [facility] that Prior Authorization for Dexcom G7 sensor has been APPROVED through 02/18/2024   PA #/Case ID/Reference #: [id number]

## 2023-02-23 NOTE — Telephone Encounter (Signed)
Henry Hinton is aware

## 2023-03-04 ENCOUNTER — Other Ambulatory Visit: Payer: Self-pay | Admitting: "Endocrinology

## 2023-03-04 DIAGNOSIS — E1165 Type 2 diabetes mellitus with hyperglycemia: Secondary | ICD-10-CM

## 2023-03-05 ENCOUNTER — Other Ambulatory Visit: Payer: Self-pay

## 2023-03-05 ENCOUNTER — Other Ambulatory Visit (HOSPITAL_BASED_OUTPATIENT_CLINIC_OR_DEPARTMENT_OTHER): Payer: Self-pay

## 2023-03-05 MED ORDER — DEXCOM G7 RECEIVER DEVI
0 refills | Status: AC
  Filled 2023-03-05: qty 1, 30d supply, fill #0
  Filled 2023-03-22: qty 1, 90d supply, fill #0
  Filled 2023-05-07 – 2023-05-28 (×2): qty 1, 30d supply, fill #0
  Filled 2023-07-31: qty 1, 90d supply, fill #0
  Filled 2023-08-14: qty 1, 30d supply, fill #0

## 2023-03-05 NOTE — Telephone Encounter (Signed)
Dexcom G7 Receiver refill request complete

## 2023-03-08 ENCOUNTER — Other Ambulatory Visit (HOSPITAL_BASED_OUTPATIENT_CLINIC_OR_DEPARTMENT_OTHER): Payer: Self-pay

## 2023-03-13 ENCOUNTER — Encounter: Payer: Self-pay | Admitting: "Endocrinology

## 2023-03-13 ENCOUNTER — Other Ambulatory Visit: Payer: Self-pay

## 2023-03-13 ENCOUNTER — Other Ambulatory Visit (HOSPITAL_BASED_OUTPATIENT_CLINIC_OR_DEPARTMENT_OTHER): Payer: Self-pay

## 2023-03-13 ENCOUNTER — Ambulatory Visit: Payer: BC Managed Care – PPO | Admitting: "Endocrinology

## 2023-03-13 VITALS — BP 134/80 | HR 79 | Ht 69.0 in | Wt 339.6 lb

## 2023-03-13 DIAGNOSIS — E1165 Type 2 diabetes mellitus with hyperglycemia: Secondary | ICD-10-CM | POA: Diagnosis not present

## 2023-03-13 DIAGNOSIS — Z7985 Long-term (current) use of injectable non-insulin antidiabetic drugs: Secondary | ICD-10-CM

## 2023-03-13 DIAGNOSIS — E782 Mixed hyperlipidemia: Secondary | ICD-10-CM | POA: Diagnosis not present

## 2023-03-13 DIAGNOSIS — Z7984 Long term (current) use of oral hypoglycemic drugs: Secondary | ICD-10-CM | POA: Diagnosis not present

## 2023-03-13 MED ORDER — TIRZEPATIDE 7.5 MG/0.5ML ~~LOC~~ SOAJ
7.5000 mg | SUBCUTANEOUS | 0 refills | Status: DC
  Filled 2023-03-13 – 2023-03-30 (×2): qty 2, 28d supply, fill #0

## 2023-03-13 MED ORDER — SYNJARDY 12.5-1000 MG PO TABS
1.0000 | ORAL_TABLET | Freq: Two times a day (BID) | ORAL | 3 refills | Status: DC
  Filled 2023-03-13 – 2023-05-07 (×2): qty 60, 30d supply, fill #0
  Filled 2023-06-18: qty 60, 30d supply, fill #1
  Filled 2023-07-17: qty 60, 30d supply, fill #2
  Filled 2023-08-28: qty 60, 30d supply, fill #3

## 2023-03-13 NOTE — Patient Instructions (Signed)

## 2023-03-13 NOTE — Progress Notes (Signed)
Outpatient Endocrinology Note Henry Wilmar, MD  03/13/23   Henry Hinton [name]. 14-Oct-1960 [id number]  Referring Provider: Shade Flood, MD Primary Care Provider: Shade Flood, MD Reason for consultation: Subjective   Assessment & Plan  Diagnoses and all orders for this visit:  Uncontrolled type 2 diabetes mellitus with hyperglycemia (HCC)  Long term (current) use of oral hypoglycemic drugs  Long-term (current) use of injectable non-insulin antidiabetic drugs  Mixed hypercholesterolemia and hypertriglyceridemia  Other orders -     tirzepatide (MOUNJARO) 7.5 MG/0.5ML Pen; Inject 7.5 mg into the skin once a week. -     Empagliflozin-metFORMIN HCl (SYNJARDY) 12.08-998 MG TABS; Take 1 tablet by mouth 2 (two) times daily.    Diabetes Type II complicated by hyperglycemia,  Lab Results  Component Value Date   GFR 89.10 02/01/2023   Hba1c goal less than 7, current Hba1c is  Lab Results  Component Value Date   HGBA1C 9.5 (H) 02/01/2023   Will recommend the following:  Mounjaro 5 mg/week Lantus 28 units once every morning Jardiance 10 mg qd Metformin 1000mg  bid Stop both of the above if synjardy 12.08/998 mg bid is covered   No known contraindications/side effects to any of above medications  -Last LD and Tg are as follows: Lab Results  Component Value Date   LDLCALC 93 02/01/2023    Lab Results  Component Value Date   TRIG 187.0 (H) 02/01/2023   -On atorvastatin 10 mg every day (doesn't take M/W/F due to cramps) -pt will attempt taking it everyday again if tolerated  -Follow low fat diet and exercise   -Blood pressure goal <140/90 - Microalbumin/creatinine goal is < 30 -Last MA/Cr is as follows: Lab Results  Component Value Date   MICROALBUR <0.7 02/01/2023   -not on ACE/ARB  -diet changes including salt restriction -limit eating outside -counseled BP targets per standards of diabetes care -uncontrolled blood pressure can lead to  retinopathy, nephropathy and cardiovascular and atherosclerotic heart disease  Reviewed and counseled on: -A1C target -Blood sugar targets -Complications of uncontrolled diabetes  -Checking blood sugar before meals and bedtime and bring log next visit -All medications with mechanism of action and side effects -Hypoglycemia management: rule of 15's, Glucagon Emergency Kit and medical alert ID -low-carb low-fat plate-method diet -At least 20 minutes of physical activity per day -Annual dilated retinal eye exam and foot exam -compliance and follow up needs -follow up as scheduled or earlier if problem gets worse  Call if blood sugar is less than 70 or consistently above 250    Take a 15 gm snack of carbohydrate at bedtime before you go to sleep if your blood sugar is less than 100.    If you are going to fast after midnight for a test or procedure, ask your physician for instructions on how to reduce/decrease your insulin dose.    Call if blood sugar is less than 70 or consistently above 250  -Treating a low sugar by rule of 15  (15 gms of sugar every 15 min until sugar is more than 70) If you feel your sugar is low, test your sugar to be sure If your sugar is low (less than 70), then take 15 grams of a fast acting Carbohydrate (3-4 glucose tablets or glucose gel or 4 ounces of juice or regular soda) Recheck your sugar 15 min after treating low to make sure it is more than 70 If sugar is still less than 70, treat again with  15 grams of carbohydrate          Don't drive the hour of hypoglycemia  If unconscious/unable to eat or drink by mouth, use glucagon injection or nasal spray baqsimi and call 911. Can repeat again in 15 min if still unconscious.  Return in about 8 weeks (around 05/08/2023).   I have reviewed current medications, nurse's notes, allergies, vital signs, past medical and surgical history, family medical history, and social history for this encounter. Counseled patient on  symptoms, examination findings, lab findings, imaging results, treatment decisions and monitoring and prognosis. The patient understood the recommendations and agrees with the treatment plan. All questions regarding treatment plan were fully answered.  Henry Boqueron, MD  03/13/23    History of Present Illness Henry Hinton. is a 62 y.o. year old male who presents for follow up for Type II diabetes mellitus.  [place] [name]. was first diagnosed in 2018.   Diabetes education -  Home diabetes regimen: Jardiance 10 mg every day Metformin 1000mg  bid Mounjaro 5 mg/week (better weight loss) Lantus 28 units every day   Previously Trulicity 4.5 mg weekly   COMPLICATIONS -  MI/Stroke -  retinopathy -  neuropathy -  nephropathy  BLOOD SUGAR DATA  CGM interpretation: At today's visit, we reviewed her CGM downloads. The full report is scanned in the media. Reviewing the CGM trends, BG are mild elevated across the day.   Physical Exam  BP 134/80   Pulse 79   Ht 5\' 9"  (1.753 m)   Wt (!) 339 lb 9.6 oz (154 kg)   SpO2 95%   BMI 50.15 kg/m    Constitutional: well developed, well nourished Head: normocephalic, atraumatic Eyes: sclera anicteric, no redness Neck: supple Lungs: normal respiratory effort Neurology: alert and oriented Skin: dry, no appreciable rashes Musculoskeletal: no appreciable defects Psychiatric: normal mood and affect Diabetic Foot Exam - Simple   No data filed      Current Medications Patient's Medications  New Prescriptions   EMPAGLIFLOZIN-METFORMIN HCL (SYNJARDY) 12.08-998 MG TABS    Take 1 tablet by mouth 2 (two) times daily.   TIRZEPATIDE (MOUNJARO) 7.5 MG/0.5ML PEN    Inject 7.5 mg into the skin once a week.  Previous Medications   ASCORBIC ACID (VITAMIN C) 1000 MG TABLET    Take 1,000 mg by mouth daily.   ASPIRIN EC 81 MG TABLET    Take 81 mg by mouth daily.   ATORVASTATIN (LIPITOR) 10 MG TABLET    Take 1 tablet (10 mg total) by mouth  daily.   CONTINUOUS GLUCOSE RECEIVER (DEXCOM G7 RECEIVER) DEVI    Use to monitor glucose   CONTINUOUS GLUCOSE SENSOR (DEXCOM G7 SENSOR) MISC    1 Device by Does not apply route continuous.   FERROUS SULFATE 325 (65 FE) MG TABLET    Take 325 mg by mouth daily with breakfast.   FLUTICASONE (FLONASE) 50 MCG/ACT NASAL SPRAY    Place 2 sprays into both nostrils daily.   FUROSEMIDE (LASIX) 80 MG TABLET    TAKE 1 TABLET BY MOUTH 2 TIMES DAILY.   IBUPROFEN (ADVIL,MOTRIN) 200 MG TABLET    Take 600 mg by mouth every 6 (six) hours as needed for mild pain.   INSULIN GLARGINE (LANTUS SOLOSTAR) 100 UNIT/ML SOLOSTAR PEN    Inject 10 Units into the skin daily. + sliding scale, max dose 40 units a day   INSULIN PEN NEEDLE (PEN NEEDLES) 31G X 6 MM MISC  1 Needle by Does not apply route daily.   IPRATROPIUM (ATROVENT) 0.06 % NASAL SPRAY    Place 1-2 sprays into both nostrils 4 (four) times daily. As needed for nasal congestion   KLOR-CON M20 20 MEQ TABLET    TAKE 1 TABLET BY MOUTH TWICE A DAY   MELOXICAM (MOBIC) 15 MG TABLET    Take 15 mg by mouth daily.   METFORMIN (GLUCOPHAGE) 1000 MG TABLET    TAKE 1 TABLET BY MOUTH 2 TIMES DAILY WITH A MEAL.   MULTIPLE VITAMINS-MINERALS (MULTIVITAMIN WITH MINERALS) TABLET    Take 1 tablet by mouth daily.   ZINC 15 MG CAPS    daily.  Modified Medications   No medications on file  Discontinued Medications   IPRATROPIUM (ATROVENT) 0.06 % NASAL SPRAY    Place into the nose.   TIRZEPATIDE (MOUNJARO) 5 MG/0.5ML PEN    Inject 5 mg into the skin once a week.    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 R[facility] [place] [place]   DVT (deep venous thrombosis) (HCC) 6/16   Rt calf  took xarelto for 6  months   Family history of anesthesia complication    " father having delirium after anesthesia"   Heart murmur    Hx; of as a child   History of kidney stones    x2   Joint pain    Hx: of periodically   Numbness and tingling    Hx: of in right arm   Pneumonia    Sleep apnea    Cpap use- settings 10    Past Surgical History Past Surgical History:  Procedure Laterality Date   BACK SURGERY     COLONOSCOPY     Hx: of   COLONOSCOPY N/A 11/23/2015   Procedure: COLONOSCOPY;  Surgeon: Hilarie Fredrickson, MD;  Location: [facility];  Service: Endoscopy;  Laterality: N/A;   HERNIA REPAIR N/A    Phreesia 03/19/2020   INGUINAL HERNIA REPAIR Left 06/01/2015   Procedure: LEFT INGUINAL HERNIA REPAIR WITH MESH;  Surgeon: Chevis Pretty III, MD;  Location: [facility];  Service: General;  Laterality: Left;   INGUINAL HERNIA REPAIR     INSERTION OF MESH Left 06/01/2015   Procedure: INSERTION OF MESH;  Surgeon: Chevis Pretty III, MD;  Location: [facility];  Service: General;  Laterality: Left;   JOINT REPLACEMENT Bilateral    BTHA   KNEE ARTHROSCOPY Left 07/21/2014   Dr. Thurston Hole   LUMBAR LAMINECTOMY  2005   POSTERIOR CERVICAL LAMINECTOMY WITH MET- RX Right 01/21/2013   Procedure: Right Sitting C6-7 Microdiskectomy with Met-rex;  Surgeon: Barnett Abu, MD;  Location: [facility];  Service: Neurosurgery;  Laterality: Right;  Right Sitting C6-7 Microdiskectomy with Met-rex   SPINE SURGERY N/A    Phreesia 03/19/2020   STENT PLACEMENT RT URETER ([facility] HX)  10/23/2014   tenosynovitis  2000   Hx: of thumb   TONSILLECTOMY     TOTAL HIP ARTHROPLASTY Bilateral    WISDOM TOOTH EXTRACTION  1974   all 4    Family History family history includes COPD in his maternal grandmother; Diabetes in his  father; Heart disease in his maternal grandfather; Lung cancer in his paternal grandfather; Prostate cancer in his father.  Social History Social History   Socioeconomic History   Marital status: Divorced    Spouse name: Not on  file   Number of children: 0   Years of education: Not on file   Highest education level: Master's degree (e.g., MA, MS, MEng, MEd, MSW, MBA)  Occupational History   Occupation: Psychologist, counselling: [employer]  Tobacco Use   Smoking status: Never   Smokeless tobacco: Former    Types: Chew    Quit date: 05/01/1993  Vaping Use   Vaping status: Never Used  Substance and Sexual Activity   Alcohol use: Yes    Alcohol/week: 8.0 standard drinks of alcohol    Types: 4 Cans of beer, 4 Shots of liquor per week    Comment: BEER AND LIQUOR   Drug use: No   Sexual activity: Not Currently  Other Topics Concern   Not on file  Social History Narrative   Exercise swimming 3-5 times/week for 45 minutes   Social Determinants of Health   Financial Resource Strain: Low Risk  (12/16/2022)   Overall Financial Resource Strain (CARDIA)    Difficulty of Paying Living Expenses: Not hard at all  Food Insecurity: No Food Insecurity (12/16/2022)   Hunger Vital Sign    Worried About Running Out of Food in the Last Year: Never true    Ran Out of Food in the Last Year: Never true  Transportation Needs: No Transportation Needs (12/16/2022)   PRAPARE - Administrator, Civil Service (Medical): No    Lack of Transportation (Non-Medical): No  Physical Activity: Sufficiently Active (12/16/2022)   Exercise Vital Sign    Days of Exercise per Week: 4 days    Minutes of Exercise per Session: 70 min  Stress: No Stress Concern Present (12/16/2022)   [employer] of Occupational Health - Occupational Stress Questionnaire    Feeling of Stress : Not at all  Social Connections: Moderately Integrated (12/16/2022)   Social Connection and Isolation Panel [NHANES]    Frequency of Communication with Friends and Family: More than three times a week    Frequency of Social Gatherings with Friends and Family: More than three times a week    Attends Religious Services: More than 4 times  per year    Active Member of Clubs or Organizations: Yes    Attends Engineer, structural: More than 4 times per year    Marital Status: Divorced  Intimate Partner Violence: Not on file    Lab Results  Component Value Date   HGBA1C 9.5 (H) 02/01/2023   HGBA1C 9.9 (H) 12/20/2022   HGBA1C 9.1 (H) 07/19/2022   Lab Results  Component Value Date   CHOL 176 02/01/2023   Lab Results  Component Value Date   HDL 45.20 02/01/2023   Lab Results  Component Value Date   LDLCALC 93 02/01/2023   Lab Results  Component Value Date   TRIG 187.0 (H) 02/01/2023   Lab Results  Component Value Date   CHOLHDL 4 02/01/2023   Lab Results  Component Value Date   CREATININE 0.92 02/01/2023   Lab Results  Component Value Date   GFR 89.10 02/01/2023   Lab Results  Component Value Date   MICROALBUR <0.7 02/01/2023      Component Value Date/Time   NA 139 02/01/2023 0905   NA 138 03/18/2020 0809  K 4.6 02/01/2023 0905   CL 104 02/01/2023 0905   CO2 28 02/01/2023 0905   GLUCOSE 247 (H) 02/01/2023 0905   BUN 20 02/01/2023 0905   BUN 13 03/18/2020 0809   CREATININE 0.92 02/01/2023 0905   CREATININE 0.71 02/24/2016 1603   CALCIUM 8.3 (L) 02/01/2023 0905   PROT 6.1 02/01/2023 0905   PROT 6.4 03/18/2020 0809   ALBUMIN 3.9 02/01/2023 0905   ALBUMIN 4.1 03/18/2020 0809   AST 23 02/01/2023 0905   ALT 34 02/01/2023 0905   ALKPHOS 105 02/01/2023 0905   BILITOT 0.5 02/01/2023 0905   BILITOT 0.4 03/18/2020 0809   GFRNONAA 100 03/18/2020 0809   GFRNONAA >89 02/24/2016 1603   GFRAA 116 03/18/2020 0809   GFRAA >89 02/24/2016 1603      Latest Ref Rng & Units 02/01/2023    9:05 AM 12/20/2022    9:41 AM 07/19/2022    9:17 AM  BMP  Glucose 70 - 99 mg/dL 161  096  045   BUN 6 - 23 mg/dL 20  17  17    Creatinine 0.40 - 1.50 mg/dL 4.09  8.11  9.14   Sodium 135 - 145 mEq/L 139  138  137   Potassium 3.5 - 5.1 mEq/L 4.6  4.2  4.2   Chloride 96 - 112 mEq/L 104  99  100   CO2 19 - 32  mEq/L 28  28  29    Calcium 8.4 - 10.5 mg/dL 8.3  8.7  9.4        Component Value Date/Time   WBC 75.0 (HH) 05/21/2019 0640   RBC 4.24 05/21/2019 0640   HGB 11.3 (L) 05/21/2019 0640   HCT 37.8 (L) 05/21/2019 0640   PLT 207 05/21/2019 0640   MCV 89.2 05/21/2019 0640   MCV 83.7 12/10/2017 1608   MCH 26.7 05/21/2019 0640   MCHC 29.9 (L) 05/21/2019 0640   RDW 15.1 05/21/2019 0640   LYMPHSABS 68.2 (H) 05/21/2019 0640   MONOABS 0.8 05/21/2019 0640   EOSABS 0.0 05/21/2019 0640   BASOSABS 0.1 05/21/2019 0640     Parts of this note may have been dictated using voice recognition software. There may be variances in spelling and vocabulary which are unintentional. Not all errors are proofread. Please notify the Thereasa Parkin if any discrepancies are noted or if the meaning of any statement is not clear.

## 2023-03-16 ENCOUNTER — Other Ambulatory Visit (HOSPITAL_BASED_OUTPATIENT_CLINIC_OR_DEPARTMENT_OTHER): Payer: Self-pay

## 2023-03-22 ENCOUNTER — Other Ambulatory Visit: Payer: Self-pay

## 2023-03-22 ENCOUNTER — Other Ambulatory Visit (HOSPITAL_BASED_OUTPATIENT_CLINIC_OR_DEPARTMENT_OTHER): Payer: Self-pay

## 2023-03-22 ENCOUNTER — Other Ambulatory Visit (HOSPITAL_COMMUNITY): Payer: Self-pay

## 2023-03-22 ENCOUNTER — Telehealth: Payer: Self-pay

## 2023-03-22 NOTE — Telephone Encounter (Signed)
Pharmacy Patient Advocate Encounter   Received notification from CoverMyMeds that prior authorization for [facility] is required/requested.   Insurance verification completed.   The patient is insured through [facility] .   Per test claim: PA required; PA submitted to above mentioned insurance via CoverMyMeds Key/confirmation #/EOC [id number] Status is pending

## 2023-03-23 ENCOUNTER — Other Ambulatory Visit (HOSPITAL_BASED_OUTPATIENT_CLINIC_OR_DEPARTMENT_OTHER): Payer: Self-pay

## 2023-03-24 ENCOUNTER — Other Ambulatory Visit (HOSPITAL_BASED_OUTPATIENT_CLINIC_OR_DEPARTMENT_OTHER): Payer: Self-pay

## 2023-03-26 ENCOUNTER — Other Ambulatory Visit (HOSPITAL_BASED_OUTPATIENT_CLINIC_OR_DEPARTMENT_OTHER): Payer: Self-pay

## 2023-03-26 NOTE — Telephone Encounter (Signed)
Pharmacy Patient Advocate Encounter  Received notification from [employer] [facility] that Prior Authorization for Henry Hinton has been APPROVED from 03/21/23 to 03/20/2026   PA #/Case ID/Reference #: [id number]

## 2023-03-30 ENCOUNTER — Other Ambulatory Visit (HOSPITAL_BASED_OUTPATIENT_CLINIC_OR_DEPARTMENT_OTHER): Payer: Self-pay

## 2023-05-03 ENCOUNTER — Other Ambulatory Visit: Payer: Self-pay | Admitting: "Endocrinology

## 2023-05-03 ENCOUNTER — Other Ambulatory Visit (HOSPITAL_BASED_OUTPATIENT_CLINIC_OR_DEPARTMENT_OTHER): Payer: Self-pay

## 2023-05-03 MED ORDER — MOUNJARO 7.5 MG/0.5ML ~~LOC~~ SOAJ
7.5000 mg | SUBCUTANEOUS | 0 refills | Status: DC
  Filled 2023-05-03: qty 2, 28d supply, fill #0

## 2023-05-07 ENCOUNTER — Encounter: Payer: Self-pay | Admitting: "Endocrinology

## 2023-05-07 ENCOUNTER — Other Ambulatory Visit (HOSPITAL_BASED_OUTPATIENT_CLINIC_OR_DEPARTMENT_OTHER): Payer: Self-pay

## 2023-05-07 ENCOUNTER — Other Ambulatory Visit: Payer: Self-pay

## 2023-05-07 ENCOUNTER — Telehealth (INDEPENDENT_AMBULATORY_CARE_PROVIDER_SITE_OTHER): Payer: 59 | Admitting: "Endocrinology

## 2023-05-07 VITALS — Ht 69.0 in

## 2023-05-07 DIAGNOSIS — Z7984 Long term (current) use of oral hypoglycemic drugs: Secondary | ICD-10-CM | POA: Diagnosis not present

## 2023-05-07 DIAGNOSIS — Z7985 Long-term (current) use of injectable non-insulin antidiabetic drugs: Secondary | ICD-10-CM | POA: Diagnosis not present

## 2023-05-07 DIAGNOSIS — E782 Mixed hyperlipidemia: Secondary | ICD-10-CM

## 2023-05-07 DIAGNOSIS — Z794 Long term (current) use of insulin: Secondary | ICD-10-CM | POA: Diagnosis not present

## 2023-05-07 DIAGNOSIS — E1165 Type 2 diabetes mellitus with hyperglycemia: Secondary | ICD-10-CM

## 2023-05-07 MED ORDER — TIRZEPATIDE 10 MG/0.5ML ~~LOC~~ SOAJ
10.0000 mg | SUBCUTANEOUS | 0 refills | Status: DC
  Filled 2023-05-07 – 2023-05-18 (×3): qty 2, 28d supply, fill #0

## 2023-05-07 NOTE — Patient Instructions (Signed)

## 2023-05-07 NOTE — Progress Notes (Signed)
 The patient reports they are currently: [place]. I spent 7 minutes on the video with the patient on the date of service. I spent an additional 5-10 minutes on pre- and post-visit activities on the date of service.   The patient was physically located in [place]  or a state in which I am permitted to provide care. The patient and/or parent/guardian understood that s/he may incur co-pays and cost sharing, and agreed to the telemedicine visit. The visit was reasonable and appropriate under the circumstances given the patient's presentation at the time.  The patient and/or parent/guardian has been advised of the potential risks and limitations of this mode of treatment (including, but not limited to, the absence of in-person examination) and has agreed to be treated using telemedicine. The patient's/patient's family's questions regarding telemedicine have been answered.   The patient and/or parent/guardian has also been advised to contact their provider's office for worsening conditions, and seek emergency medical treatment and/or call 911 if the patient deems either necessary.    Outpatient Endocrinology Note Henry Birmingham, MD  05/07/23   Henry Hinton 1960/10/29 [id number]  Referring Provider: Levora Reyes SAUNDERS, MD Primary Care Provider: Levora Reyes SAUNDERS, MD Reason for consultation: Subjective   Assessment & Plan  Henry Hinton was seen today for follow-up.  Diagnoses and all orders for this visit:  Uncontrolled type 2 diabetes mellitus with hyperglycemia (HCC)  Long term (current) use of oral hypoglycemic drugs  Long-term (current) use of injectable non-insulin  antidiabetic drugs  Long-term insulin  use (HCC)  Mixed hypercholesterolemia and hypertriglyceridemia  Other orders -     tirzepatide  (MOUNJARO ) 10 MG/0.5ML Pen; Inject 10 mg into the skin once a week.     Diabetes Type II complicated by hyperglycemia,  Lab Results  Component Value Date   GFR 89.10  02/01/2023   Hba1c goal less than 7, current Hba1c is  Lab Results  Component Value Date   HGBA1C 9.5 (H) 02/01/2023   Will recommend the following:  Mounjaro  10 mg/week Lantus  28 units once every morning Jardiance  10 mg qd Metformin  1000mg  bid Stop both of the above (metformin  and jardiance ) if synjardy  12.08/998 mg bid is covered  No known contraindications/side effects to any of above medications  -Last LD and Tg are as follows: Lab Results  Component Value Date   LDLCALC 93 02/01/2023    Lab Results  Component Value Date   TRIG 187.0 (H) 02/01/2023   -On atorvastatin  10 mg (doesn't take M/W/F due to cramps) -pt will attempt taking it everyday again if tolerated  -Follow low fat diet and exercise   -Blood pressure goal <140/90 - Microalbumin/creatinine goal is < 30 -Last MA/Cr is as follows: Lab Results  Component Value Date   MICROALBUR <0.7 02/01/2023   -not on ACE/ARB  -diet changes including salt restriction -limit eating outside -counseled BP targets per standards of diabetes care -uncontrolled blood pressure can lead to retinopathy, nephropathy and cardiovascular and atherosclerotic heart disease  Reviewed and counseled on: -A1C target -Blood sugar targets -Complications of uncontrolled diabetes  -Checking blood sugar before meals and bedtime and bring log next visit -All medications with mechanism of action and side effects -Hypoglycemia management: rule of 15's, Glucagon Emergency Kit and medical alert ID -low-carb low-fat plate-method diet -At least 20 minutes of physical activity per day -Annual dilated retinal eye exam and foot exam -compliance and follow up needs -follow up as scheduled or earlier if problem gets worse  Call if blood sugar is less  than 70 or consistently above 250    Take a 15 gm snack of carbohydrate at bedtime before you go to sleep if your blood sugar is less than 100.    If you are going to fast after midnight for a test  or procedure, ask your physician for instructions on how to reduce/decrease your insulin  dose.    Call if blood sugar is less than 70 or consistently above 250  -Treating a low sugar by rule of 15  (15 gms of sugar every 15 min until sugar is more than 70) If you feel your sugar is low, test your sugar to be sure If your sugar is low (less than 70), then take 15 grams of a fast acting Carbohydrate (3-4 glucose tablets or glucose gel or 4 ounces of juice or regular soda) Recheck your sugar 15 min after treating low to make sure it is more than 70 If sugar is still less than 70, treat again with 15 grams of carbohydrate          Don't drive the hour of hypoglycemia  If unconscious/unable to eat or drink by mouth, use glucagon injection or nasal spray baqsimi and call 911. Can repeat again in 15 min if still unconscious.  Return in about 4 weeks (around 06/04/2023).   I have reviewed current medications, nurse's notes, allergies, vital signs, past medical and surgical history, family medical history, and social history for this encounter. Counseled patient on symptoms, examination findings, lab findings, imaging results, treatment decisions and monitoring and prognosis. The patient understood the recommendations and agrees with the treatment plan. All questions regarding treatment plan were fully answered.  Henry Birmingham, MD  05/07/23    History of Present Illness Henry Hinton. is a 63 y.o. year old male who presents for follow up for Type II diabetes mellitus.  Henry Hinton. was first diagnosed in 2018.   Diabetes education -  Home diabetes regimen: Jardiance  10 mg every day Metformin  1000mg  bid Mounjaro  7.5 mg/week (better weight loss) Lantus  28 units every day  Previously Trulicity  4.5 mg weekly   COMPLICATIONS -  MI/Stroke -  retinopathy -  neuropathy -  nephropathy  BLOOD SUGAR DATA No recent data on dexcom to download   Physical Exam  Ht 5' 9 (1.753 m)    BMI 50.15 kg/m    Constitutional: well developed, well nourished Head: normocephalic, atraumatic Eyes: sclera anicteric, no redness Neck: supple Lungs: normal respiratory effort Neurology: alert and oriented Skin: dry, no appreciable rashes Musculoskeletal: no appreciable defects Psychiatric: normal mood and affect Diabetic Foot Exam - Simple   No data filed      Current Medications Patient's Medications  New Prescriptions   TIRZEPATIDE  (MOUNJARO ) 10 MG/0.5ML PEN    Inject 10 mg into the skin once a week.  Previous Medications   ASCORBIC ACID  (VITAMIN C) 1000 MG TABLET    Take 1,000 mg by mouth daily.   ASPIRIN  EC 81 MG TABLET    Take 81 mg by mouth daily.   ATORVASTATIN  (LIPITOR) 10 MG TABLET    Take 1 tablet (10 mg total) by mouth daily.   CONTINUOUS GLUCOSE RECEIVER (DEXCOM G7 RECEIVER) DEVI    Use to monitor glucose   CONTINUOUS GLUCOSE SENSOR (DEXCOM G7 SENSOR) MISC    1 Device by Does not apply route continuous.   EMPAGLIFLOZIN -METFORMIN  HCL (SYNJARDY ) 12.08-998 MG TABS    Take 1 tablet by mouth 2 (two) times daily.   FERROUS  SULFATE 325 (65 FE) MG TABLET    Take 325 mg by mouth daily with breakfast.   FLUTICASONE  (FLONASE ) 50 MCG/ACT NASAL SPRAY    Place 2 sprays into both nostrils daily.   FUROSEMIDE  (LASIX ) 80 MG TABLET    TAKE 1 TABLET BY MOUTH 2 TIMES DAILY.   IBUPROFEN  (ADVIL ,MOTRIN ) 200 MG TABLET    Take 600 mg by mouth every 6 (six) hours as needed for mild pain.   INSULIN  GLARGINE (LANTUS  SOLOSTAR) 100 UNIT/ML SOLOSTAR PEN    Inject 10 Units into the skin daily. + sliding scale, max dose 40 units a day   INSULIN  PEN NEEDLE (PEN NEEDLES) 31G X 6 MM MISC    1 Needle by Does not apply route daily.   IPRATROPIUM (ATROVENT ) 0.06 % NASAL SPRAY    Place 1-2 sprays into both nostrils 4 (four) times daily. As needed for nasal congestion   KLOR-CON  M20 20 MEQ TABLET    TAKE 1 TABLET BY MOUTH TWICE A DAY   MELOXICAM  (MOBIC ) 15 MG TABLET    Take 15 mg by mouth daily.    METFORMIN  (GLUCOPHAGE ) 1000 MG TABLET    TAKE 1 TABLET BY MOUTH 2 TIMES DAILY WITH A MEAL.   MISC NATURAL PRODUCTS (TRANSFERON) CAPS       MULTIPLE VITAMINS-MINERALS (MULTIVITAMIN WITH MINERALS) TABLET    Take 1 tablet by mouth daily.   ZINC 15 MG CAPS    daily.  Modified Medications   No medications on file  Discontinued Medications   TIRZEPATIDE  (MOUNJARO ) 7.5 MG/0.5ML PEN    Inject 7.5 mg into the skin once a week.    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 [facility] [place] [place]   DVT (deep venous thrombosis) (HCC) 6/16   Rt calf  took xarelto  for 6 months   Family history of anesthesia complication     father having delirium after anesthesia   Heart murmur    Hx; of as a child   History of kidney stones    x2   Joint pain    Hx: of periodically   Numbness and tingling    Hx: of in right arm   Pneumonia    Sleep apnea    Cpap use- settings 10    Past Surgical History Past Surgical History:  Procedure Laterality Date   BACK SURGERY     COLONOSCOPY     Hx: of   COLONOSCOPY N/A 11/23/2015   Procedure: COLONOSCOPY;  Surgeon: Norleen LOISE Kiang, MD;  Location: [facility];  Service: Endoscopy;  Laterality: N/A;   HERNIA REPAIR N/A    Phreesia 03/19/2020   INGUINAL HERNIA REPAIR Left 06/01/2015   Procedure: LEFT INGUINAL HERNIA REPAIR WITH MESH;  Surgeon: Deward Null III, MD;  Location: [facility];  Service: General;  Laterality: Left;   INGUINAL HERNIA REPAIR     INSERTION OF MESH Left 06/01/2015   Procedure: INSERTION OF MESH;  Surgeon: Deward Null III, MD;  Location: [facility];  Service: General;  Laterality: Left;   JOINT REPLACEMENT Bilateral    BTHA   KNEE ARTHROSCOPY Left 07/21/2014   Dr. Jane  LUMBAR LAMINECTOMY  2005   POSTERIOR CERVICAL LAMINECTOMY WITH MET- RX Right 01/21/2013   Procedure: Right Sitting C6-7 Microdiskectomy with Met-rex;  Surgeon: Victory Gens, MD;  Location: [facility];  Service: Neurosurgery;  Laterality: Right;  Right Sitting C6-7 Microdiskectomy with Met-rex   SPINE SURGERY N/A    Phreesia 03/19/2020   STENT PLACEMENT RT URETER ([facility] HX)  10/23/2014   tenosynovitis  2000   Hx: of thumb   TONSILLECTOMY     TOTAL HIP ARTHROPLASTY Bilateral    WISDOM TOOTH EXTRACTION  1974   all 4    Family History family history includes COPD in his maternal grandmother; Diabetes in his father; Heart disease in his maternal grandfather; Lung cancer in his paternal grandfather; Prostate cancer in his father.  Social History Social History   Socioeconomic History   Marital status: Divorced    Spouse name: Not on file   Number of children: 0   Years of education: Not on file   Highest education level: Master's degree (e.g., MA, MS, MEng, MEd, MSW, MBA)  Occupational History   Occupation: Psychologist, Counselling: [employer]  Tobacco Use   Smoking status: Never   Smokeless tobacco: Former    Types: Chew    Quit date: 05/01/1993  Vaping Use   Vaping status: Never Used  Substance and Sexual Activity   Alcohol use: Yes    Alcohol/week: 8.0 standard drinks of alcohol    Types: 4 Cans of beer, 4 Shots of liquor per week    Comment: BEER AND LIQUOR   Drug use: No   Sexual activity: Not Currently  Other Topics Concern   Not on file  Social History Narrative   Exercise swimming 3-5 times/week for 45 minutes   Social Drivers of Corporate Investment Banker Strain: Low Risk  (12/16/2022)   Overall Financial Resource Strain (CARDIA)    Difficulty of Paying Living Expenses: Not hard at all  Food Insecurity: No Food Insecurity (12/16/2022)   Hunger Vital Sign    Worried About Running Out of Food in the Last Year: Never true    Ran Out of Food  in the Last Year: Never true  Transportation Needs: No Transportation Needs (12/16/2022)   PRAPARE - Administrator, Civil Service (Medical): No    Lack of Transportation (Non-Medical): No  Physical Activity: Sufficiently Active (12/16/2022)   Exercise Vital Sign    Days of Exercise per Week: 4 days    Minutes of Exercise per Session: 70 min  Stress: No Stress Concern Present (12/16/2022)   [employer] of Occupational Health - Occupational Stress Questionnaire    Feeling of Stress : Not at all  Social Connections: Moderately Integrated (12/16/2022)   Social Connection and Isolation Panel [NHANES]    Frequency of Communication with Friends and Family: More than three times a week    Frequency of Social Gatherings with Friends and Family: More than three times a week    Attends Religious Services: More than 4 times per year    Active Member of Clubs or Organizations: Yes    Attends Banker Meetings: More than 4 times per year    Marital Status: Divorced  Intimate Partner Violence: Not on file    Lab Results  Component Value Date   HGBA1C 9.5 (H) 02/01/2023   HGBA1C 9.9 (H) 12/20/2022   HGBA1C 9.1 (H) 07/19/2022   Lab Results  Component Value Date   CHOL  176 02/01/2023   Lab Results  Component Value Date   HDL 45.20 02/01/2023   Lab Results  Component Value Date   LDLCALC 93 02/01/2023   Lab Results  Component Value Date   TRIG 187.0 (H) 02/01/2023   Lab Results  Component Value Date   CHOLHDL 4 02/01/2023   Lab Results  Component Value Date   CREATININE 0.92 02/01/2023   Lab Results  Component Value Date   GFR 89.10 02/01/2023   Lab Results  Component Value Date   MICROALBUR <0.7 02/01/2023      Component Value Date/Time   NA 139 02/01/2023 0905   NA 138 03/18/2020 0809   K 4.6 02/01/2023 0905   CL 104 02/01/2023 0905   CO2 28 02/01/2023 0905   GLUCOSE 247 (H) 02/01/2023 0905   BUN 20 02/01/2023 0905   BUN 13 03/18/2020  0809   CREATININE 0.92 02/01/2023 0905   CREATININE 0.71 02/24/2016 1603   CALCIUM  8.3 (L) 02/01/2023 0905   PROT 6.1 02/01/2023 0905   PROT 6.4 03/18/2020 0809   ALBUMIN 3.9 02/01/2023 0905   ALBUMIN 4.1 03/18/2020 0809   AST 23 02/01/2023 0905   ALT 34 02/01/2023 0905   ALKPHOS 105 02/01/2023 0905   BILITOT 0.5 02/01/2023 0905   BILITOT 0.4 03/18/2020 0809   GFRNONAA 100 03/18/2020 0809   GFRNONAA >89 02/24/2016 1603   GFRAA 116 03/18/2020 0809   GFRAA >89 02/24/2016 1603      Latest Ref Rng & Units 02/01/2023    9:05 AM 12/20/2022    9:41 AM 07/19/2022    9:17 AM  BMP  Glucose 70 - 99 mg/dL 752  768  811   BUN 6 - 23 mg/dL 20  17  17    Creatinine 0.40 - 1.50 mg/dL 9.07  9.27  9.21   Sodium 135 - 145 mEq/L 139  138  137   Potassium 3.5 - 5.1 mEq/L 4.6  4.2  4.2   Chloride 96 - 112 mEq/L 104  99  100   CO2 19 - 32 mEq/L 28  28  29    Calcium  8.4 - 10.5 mg/dL 8.3  8.7  9.4        Component Value Date/Time   WBC 75.0 (HH) 05/21/2019 0640   RBC 4.24 05/21/2019 0640   HGB 11.3 (L) 05/21/2019 0640   HCT 37.8 (L) 05/21/2019 0640   PLT 207 05/21/2019 0640   MCV 89.2 05/21/2019 0640   MCV 83.7 12/10/2017 1608   MCH 26.7 05/21/2019 0640   MCHC 29.9 (L) 05/21/2019 0640   RDW 15.1 05/21/2019 0640   LYMPHSABS 68.2 (H) 05/21/2019 0640   MONOABS 0.8 05/21/2019 0640   EOSABS 0.0 05/21/2019 0640   BASOSABS 0.1 05/21/2019 0640     Parts of this note may have been dictated using voice recognition software. There may be variances in spelling and vocabulary which are unintentional. Not all errors are proofread. Please notify the dino if any discrepancies are noted or if the meaning of any statement is not clear.

## 2023-05-13 ENCOUNTER — Other Ambulatory Visit: Payer: Self-pay | Admitting: Family Medicine

## 2023-05-13 DIAGNOSIS — R600 Localized edema: Secondary | ICD-10-CM

## 2023-05-18 ENCOUNTER — Other Ambulatory Visit: Payer: Self-pay | Admitting: "Endocrinology

## 2023-05-18 ENCOUNTER — Encounter: Payer: Self-pay | Admitting: Family Medicine

## 2023-05-18 ENCOUNTER — Other Ambulatory Visit: Payer: Self-pay

## 2023-05-18 ENCOUNTER — Other Ambulatory Visit (HOSPITAL_BASED_OUTPATIENT_CLINIC_OR_DEPARTMENT_OTHER): Payer: Self-pay

## 2023-05-18 MED ORDER — DEXCOM G7 SENSOR MISC
1.0000 | 0 refills | Status: DC
  Filled 2023-05-18: qty 9, 90d supply, fill #0

## 2023-05-18 NOTE — Telephone Encounter (Signed)
Pt sent an update about medications

## 2023-05-19 ENCOUNTER — Other Ambulatory Visit: Payer: Self-pay | Admitting: Family Medicine

## 2023-05-19 DIAGNOSIS — R600 Localized edema: Secondary | ICD-10-CM

## 2023-05-21 NOTE — Telephone Encounter (Signed)
Medication: Furosemide 80 mg  Directions: Take 1 tablet by mouth 2 times a day Last given: 12/20/22 Number refills: 1 Last o/v: 12/20/22 Follow up: 6 months (06/25/23) Labs: 02/01/23

## 2023-05-28 ENCOUNTER — Other Ambulatory Visit (HOSPITAL_BASED_OUTPATIENT_CLINIC_OR_DEPARTMENT_OTHER): Payer: Self-pay

## 2023-06-18 ENCOUNTER — Other Ambulatory Visit: Payer: Self-pay

## 2023-06-18 ENCOUNTER — Other Ambulatory Visit: Payer: Self-pay | Admitting: "Endocrinology

## 2023-06-19 ENCOUNTER — Other Ambulatory Visit (HOSPITAL_BASED_OUTPATIENT_CLINIC_OR_DEPARTMENT_OTHER): Payer: Self-pay

## 2023-06-19 MED ORDER — MOUNJARO 10 MG/0.5ML ~~LOC~~ SOAJ
10.0000 mg | SUBCUTANEOUS | 3 refills | Status: DC
  Filled 2023-06-19: qty 2, 28d supply, fill #0
  Filled 2023-07-17: qty 2, 28d supply, fill #1
  Filled 2023-08-14: qty 2, 28d supply, fill #2
  Filled 2023-09-10: qty 2, 28d supply, fill #3

## 2023-06-20 LAB — HM DIABETES EYE EXAM

## 2023-06-21 ENCOUNTER — Encounter: Payer: Self-pay | Admitting: Family Medicine

## 2023-06-21 ENCOUNTER — Other Ambulatory Visit (HOSPITAL_BASED_OUTPATIENT_CLINIC_OR_DEPARTMENT_OTHER): Payer: Self-pay

## 2023-06-21 DIAGNOSIS — E785 Hyperlipidemia, unspecified: Secondary | ICD-10-CM

## 2023-06-21 MED ORDER — ATORVASTATIN CALCIUM 10 MG PO TABS
10.0000 mg | ORAL_TABLET | Freq: Every day | ORAL | 3 refills | Status: DC
  Filled 2023-06-21: qty 90, 90d supply, fill #0

## 2023-06-22 ENCOUNTER — Encounter: Payer: Self-pay | Admitting: Family Medicine

## 2023-06-25 ENCOUNTER — Encounter: Payer: Self-pay | Admitting: Family Medicine

## 2023-06-25 ENCOUNTER — Ambulatory Visit: Payer: 59 | Admitting: Family Medicine

## 2023-06-25 VITALS — BP 112/68 | HR 85 | Temp 98.0°F | Ht 69.0 in | Wt 331.2 lb

## 2023-06-25 DIAGNOSIS — C9110 Chronic lymphocytic leukemia of B-cell type not having achieved remission: Secondary | ICD-10-CM | POA: Diagnosis not present

## 2023-06-25 DIAGNOSIS — E785 Hyperlipidemia, unspecified: Secondary | ICD-10-CM

## 2023-06-25 DIAGNOSIS — E1165 Type 2 diabetes mellitus with hyperglycemia: Secondary | ICD-10-CM

## 2023-06-25 DIAGNOSIS — K4090 Unilateral inguinal hernia, without obstruction or gangrene, not specified as recurrent: Secondary | ICD-10-CM

## 2023-06-25 DIAGNOSIS — R600 Localized edema: Secondary | ICD-10-CM

## 2023-06-25 LAB — COMPREHENSIVE METABOLIC PANEL
ALT: 23 U/L (ref 0–53)
AST: 19 U/L (ref 0–37)
Albumin: 4.6 g/dL (ref 3.5–5.2)
Alkaline Phosphatase: 103 U/L (ref 39–117)
BUN: 17 mg/dL (ref 6–23)
CO2: 28 meq/L (ref 19–32)
Calcium: 8.8 mg/dL (ref 8.4–10.5)
Chloride: 102 meq/L (ref 96–112)
Creatinine, Ser: 0.82 mg/dL (ref 0.40–1.50)
GFR: 93.83 mL/min (ref >60.00)
Glucose, Bld: 139 mg/dL — ABNORMAL HIGH (ref 70–99)
Potassium: 4.2 meq/L (ref 3.5–5.1)
Sodium: 142 meq/L (ref 135–145)
Total Bilirubin: 0.6 mg/dL (ref 0.2–1.2)
Total Protein: 7.5 g/dL (ref 6.0–8.3)

## 2023-06-25 LAB — LIPID PANEL
Cholesterol: 174 mg/dL (ref 0–200)
HDL: 55.3 mg/dL (ref >39.00)
LDL Cholesterol: 95 mg/dL (ref 0–99)
NonHDL: 118.98
Total CHOL/HDL Ratio: 3
Triglycerides: 120 mg/dL (ref 0.0–149.0)
VLDL: 24 mg/dL (ref 0.0–40.0)

## 2023-06-25 NOTE — Patient Instructions (Addendum)
 Great work on the [employer] loss!  Keep follow up with endocrine as planned.  Swelling looks good - you can try 1/2 dose of lasix at night, full dose in am for now. Go back to full dose if swelling worsens.  If that dose works, let me know and I can change the prescription.  If any concerns on labs I will let you know.  See information below on hernia.  I do not feel any concerns on exam today but if you do continue to have discomfort in the area would recommend meeting with surgeon.  Thanks for coming in today and take care!  Hernia, Adult     A hernia happens when an organ or tissue inside your body pushes out through a weak spot in the muscles of your belly. This makes a bulge. The bulge may be: In a scar from surgery. This type of bulge is called an incisional hernia. Near your belly button. This type is called an umbilical hernia. In your groin, which is the area where your leg meets your lower belly. This type is called an inguinal hernia. If you're male, this type could also be in your scrotum. In your upper thigh. This type is called a femoral hernia. Inside your belly. This type is called a hiatal hernia. It happens when your stomach slides above your diaphragm, which is the muscle between your belly and your chest. What are the causes? You may get a hernia if: You lift heavy things. You cough over a long period of time. You have trouble pooping, also called constipation. Trouble pooping can lead to straining. There's a cut from surgery in your belly. You have a problem that's present at birth. There's fluid around your belly. You're male and have a testicle that hasn't moved down into your scrotum. You may be at greater risk for hernia if: You smoke. You're very overweight. What are the signs or symptoms? The main symptom is a bulge, but the bulge may not always be seen. It may grow bigger or be easier to see when you cough or strain, such as when you lift something heavy. If  you can push the bulge back into your belly, it may not cause pain. If you can't push it back into your belly, it may lose its blood supply. This can cause: Pain. Fever. Nausea and vomiting. Swelling. Trouble pooping. How is this diagnosed? A hernia may be diagnosed based on your symptoms, medical history, and an exam. Your health care provider may ask you to cough or move in a way that makes the bulge easier to see. You may also have tests done. These may include: X-rays. Ultrasound. CT scan. How is this treated? A hernia that's small and painless may not need to be treated. A hernia that's large or painful may be treated with surgery. Surgery involves pushing the bulge back into place and fixing the weak area of the muscle or belly. Follow these instructions at home: Activity Try not to strain. You may have to avoid lifting. Ask how much weight you can safely lift. If you lift something heavy, use your leg muscles. Do not use your back muscles to lift. Prevent trouble pooping You may need to take these actions to prevent or treat trouble pooping: Drink enough fluid to keep your pee (urine) pale yellow. Take medicines to help you poop. Eat foods high in fiber. These include beans, whole grains, and fresh fruits and vegetables. General instructions When you cough, try  to cough gently. Try to push the bulge back in by very gently pressing on it when you're lying down. Do not try to force it back in if it won't push in easily. If you're overweight, work with your provider to lose weight safely. Do not smoke, vape, or use products with nicotine or tobacco in them. If you need help quitting, talk with your provider. If you're going to have surgery, watch your hernia for changes in shape, size, or color. Tell your provider about any changes. Contact a health care provider if: You get new pain, swelling, or redness near your hernia. You have trouble pooping. Your poop is harder or larger  than normal. You have a fever or chills. You have nausea or vomiting. Your hernia can't be pushed in. Get help right away if: You have belly pain that gets worse. Your hernia: Changes in shape or size. Changes color. Feels hard or hurts when you touch it. These symptoms may be an emergency. Call 911 right away. Do not wait to see if the symptoms will go away. Do not drive yourself to the hospital. This information is not intended to replace advice given to you by your health care provider. Make sure you discuss any questions you have with your health care provider. Document Revised: 01/18/2023 Document Reviewed: 07/11/2022 Elsevier Patient Education  2024 [employer].

## 2023-06-25 NOTE — Progress Notes (Signed)
 Subjective:  Patient ID: Henry Quam., male    DOB: [birth year]  Age: 64 y.o. MRN: [id number]  CC:  Chief Complaint  Patient presents with   Medical Management of Chronic Issues    Pt is doing well no questions from him     HPI [facility] Alexus Michael. presents for   Diabetes: History of uncontrolled diabetes, associated with obesity, followed by endocrinology now with appointment with Dr. Roosevelt Locks January 6.Mounjaro  10mg  of his January update, with Lantus decreased from 28 to 26 units, and started on Synjardy in place of metformin.  A1c had decreased to the seven range.  Weight 327-334 based on MyChart message January 17.  Improved appetite suppression. He is on statin with Lipitor 10 mg every other day. 4 per week. No side effects or myalgias with this regimen.  Weight is improving - down to 323 last week on home scale.  Bone on bone changes in R knee - Dr. Everardo Pacific at [facility] - he is considering options for replacements. Had injections.  Microalbumin: normal ratio 02/01/23.  Optho, foot exam, pneumovax:  Pneumonia vaccine declined.  Declines, flu, covid vaccines.  Recent optho visit - looked good.  No symptomatic lows.  Usual range 120-140, rare 200 with certain foods.   Lab Results  Component Value Date   HGBA1C 9.5 (H) 02/01/2023   HGBA1C 9.9 (H) 12/20/2022   HGBA1C 9.1 (H) 07/19/2022   Lab Results  Component Value Date   MICROALBUR <0.7 02/01/2023   LDLCALC 93 02/01/2023   CREATININE 0.92 02/01/2023   Wt Readings from Last 3 Encounters:  06/25/23 (!) 331 lb 3.2 oz (150.2 kg)  03/13/23 (!) 339 lb 9.6 oz (154 kg)  02/07/23 (!) 341 lb 12.8 oz (155 kg)   Chronic lymphocytic leukemia Followed by Dr. Gypsy Lore.  Office visit February 18. No changes.   Chronic pedal edema: Lasix 80 mg BID, potassium BID.  No compression stockings. Swelling down. No recent swelling. Doing well. R inguinal hernia Chronic. Noted prior. Sore at times at night, no bowel changes, no  bulge. Does not want to see specialist at this time. Improves in few mins.  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   History of DVT (deep vein thrombosis) 08/19/2019   Venous stasis 08/19/2019   Essential hypertension 08/19/2019   Acute hypoxemic respiratory failure due to COVID-19 [facility]) 05/18/2019   BPH without obstruction/lower urinary tract symptoms 03/14/2018   Family history of prostate cancer 03/14/2018   History of nephrolithiasis 03/14/2018   Microscopic hematuria 03/14/2018   Diabetes mellitus without complication (HCC) 11/19/2017   Iron deficiency anemia 11/19/2017   Influenza, pneumonia 09/22/2017   Abdominal wall cellulitis 01/22/2017   Insect bite 01/22/2017   Hyperkalemia 01/22/2017   OSA (obstructive sleep apnea) 01/22/2017   Vocal cord paralysis 01/31/2016   Hypercalciuria 01/10/2016   Cellulitis 11/28/2015   Cellulitis of trunk 11/27/2015   Nonspecific abnormal finding in stool contents    Colon cancer screening    Sepsis (HCC) 10/09/2015   Panniculitis 10/09/2015   CLL (chronic lymphocytic leukemia) (HCC) 05/27/2015   Calculi, ureter 10/27/2014   Calculus of kidney 10/27/2014   Leukocytosis (leucocytosis) 08/29/2014   HNP (herniated nucleus pulposus), cervical 01/21/2013   bilat hip replacement 04/08/2012   Morbid obesity (HCC) 04/08/2012   Edema 04/08/2012  Past Medical History:  Diagnosis Date   Anemia    Arthritis    BMI 50.0-59.9, adult (HCC)    Cellulitis 11/2015   trunk   cll 11/2014   CLL - 11/30/2014- Dr. Riki [facility] [place] [place]   DVT (deep venous thrombosis) (HCC) 6/16   Rt calf  took xarelto for 6 months   Family history of anesthesia complication    " father having delirium after anesthesia"   Heart murmur    Hx; of as a child   History of  kidney stones    x2   Joint pain    Hx: of periodically   Numbness and tingling    Hx: of in right arm   Pneumonia    Sleep apnea    Cpap use- settings 10   Past Surgical History:  Procedure Laterality Date   BACK SURGERY     COLONOSCOPY     Hx: of   COLONOSCOPY N/A 11/23/2015   Procedure: COLONOSCOPY;  Surgeon: Hilarie Fredrickson, MD;  Location: [facility];  Service: Endoscopy;  Laterality: N/A;   HERNIA REPAIR N/A    Phreesia 03/19/2020   INGUINAL HERNIA REPAIR Left 06/01/2015   Procedure: LEFT INGUINAL HERNIA REPAIR WITH MESH;  Surgeon: Chevis Pretty III, MD;  Location: [facility];  Service: General;  Laterality: Left;   INGUINAL HERNIA REPAIR     INSERTION OF MESH Left 06/01/2015   Procedure: INSERTION OF MESH;  Surgeon: Chevis Pretty III, MD;  Location: [facility];  Service: General;  Laterality: Left;   JOINT REPLACEMENT Bilateral    BTHA   KNEE ARTHROSCOPY Left 07/21/2014   Dr. Thurston Hole   LUMBAR LAMINECTOMY  2005   POSTERIOR CERVICAL LAMINECTOMY WITH MET- RX Right 01/21/2013   Procedure: Right Sitting C6-7 Microdiskectomy with Met-rex;  Surgeon: Barnett Abu, MD;  Location: [facility];  Service: Neurosurgery;  Laterality: Right;  Right Sitting C6-7 Microdiskectomy with Met-rex   SPINE SURGERY N/A    Phreesia 03/19/2020   STENT PLACEMENT RT URETER ([facility] HX)  10/23/2014   tenosynovitis  2000   Hx: of thumb   TONSILLECTOMY     TOTAL HIP ARTHROPLASTY Bilateral    WISDOM TOOTH EXTRACTION  1974   all 4   Allergies  Allergen Reactions   Adhesive [Tape] Other (See Comments)    Blisters and rash at site   Other    Prior to Admission medications   Medication Sig Start Date End Date Taking? Authorizing Provider  Ascorbic Acid (VITAMIN C) 1000 MG tablet Take 1,000 mg by mouth daily.   Yes [provider]  aspirin EC 81 MG tablet Take 81 mg by mouth daily.   Yes [provider]  atorvastatin (LIPITOR) 10 MG tablet Take 1 tablet (10 mg total) by mouth daily. 06/21/23  Yes Shade Flood, MD  Continuous Glucose Receiver (DEXCOM G7 RECEIVER) DEVI Use to monitor glucose 03/05/23  Yes Motwani, Komal, MD  Continuous Glucose Sensor (DEXCOM G7 SENSOR) MISC Apply to skin as directed every 10 days. 05/18/23  Yes Motwani, Komal, MD  Empagliflozin-metFORMIN HCl (SYNJARDY) 12.08-998 MG TABS Take 1 tablet by mouth 2 (two) times daily. 03/13/23  Yes Motwani, Komal, MD  ferrous sulfate 325 (65 FE) MG tablet Take 325 mg by mouth daily with breakfast.   Yes [provider]  fluticasone (FLONASE) 50 MCG/ACT nasal spray Place 2 sprays into both nostrils daily. 05/19/21  Yes Freddy Finner, NP  furosemide (LASIX) 80 MG tablet TAKE  1 TABLET BY MOUTH TWICE A DAY 05/21/23  Yes Shade Flood, MD  ibuprofen (ADVIL,MOTRIN) 200 MG tablet Take 600 mg by mouth every 6 (six) hours as needed for mild pain.   Yes [provider]  insulin glargine (LANTUS SOLOSTAR) 100 UNIT/ML Solostar Pen Inject 10 Units into the skin daily. + sliding scale, max dose 40 units a day Patient taking differently: Inject 26 Units into the skin daily. + sliding scale, max dose 40 units a day 02/08/23  Yes Motwani, Komal, MD  Insulin Pen Needle (PEN NEEDLES) 31G X 6 MM MISC 1 Needle by Does not apply route daily. 02/08/23  Yes Motwani, Komal, MD  ipratropium (ATROVENT) 0.06 % nasal spray Place 1-2 sprays into both nostrils 4 (four) times daily. As needed for nasal congestion 09/13/21  Yes Shade Flood, MD  KLOR-CON M20 20 MEQ tablet TAKE 1 TABLET BY MOUTH TWICE A DAY 05/14/23  Yes Shade Flood, MD  meloxicam (MOBIC) 15 MG tablet Take 15 mg by mouth daily. 07/20/21  Yes [provider]  Misc Natural Products (TRANSFERON) CAPS  05/22/19  Yes [provider]  Multiple Vitamins-Minerals (MULTIVITAMIN WITH MINERALS) tablet Take 1 tablet by mouth daily.   Yes [provider]  tirzepatide Greggory Keen) 10 MG/0.5ML Pen Inject 10 mg into the skin once a week. 06/19/23  Yes Motwani, Carin Hock,  MD  Zinc 15 MG CAPS daily. 05/22/19  Yes [provider]  metFORMIN (GLUCOPHAGE) 1000 MG tablet TAKE 1 TABLET BY MOUTH 2 TIMES DAILY WITH A MEAL. Patient not taking: Reported on 06/25/2023 07/19/22   Shade Flood, MD   Social History   Socioeconomic History   Marital status: Divorced    Spouse name: Not on file   Number of children: 0   Years of education: Not on file   Highest education level: Master's degree (e.g., MA, MS, MEng, MEd, MSW, MBA)  Occupational History   Occupation: Psychologist, counselling: [employer]  Tobacco Use   Smoking status: Never   Smokeless tobacco: Former    Types: Chew    Quit date: 05/01/1993  Vaping Use   Vaping status: Never Used  Substance and Sexual Activity   Alcohol use: Yes    Alcohol/week: 8.0 standard drinks of alcohol    Types: 4 Cans of beer, 4 Shots of liquor per week    Comment: BEER AND LIQUOR   Drug use: No   Sexual activity: Not Currently  Other Topics Concern   Not on file  Social History Narrative   Exercise swimming 3-5 times/week for 45 minutes   Social Drivers of Corporate investment banker Strain: Low Risk  (06/18/2023)   Overall Financial Resource Strain (CARDIA)    Difficulty of Paying Living Expenses: Not very hard  Food Insecurity: No Food Insecurity (06/18/2023)   Hunger Vital Sign    Worried About Running Out of Food in the Last Year: Never true    Ran Out of Food in the Last Year: Never true  Transportation Needs: No Transportation Needs (06/18/2023)   PRAPARE - Administrator, Civil Service (Medical): No    Lack of Transportation (Non-Medical): No  Physical Activity: Sufficiently Active (06/18/2023)   Exercise Vital Sign    Days of Exercise per Week: 4 days    Minutes of Exercise per Session: 80 min  Stress: No Stress Concern Present (06/18/2023)   [employer] of Occupational Health - Occupational Stress Questionnaire  Feeling of Stress : Not at all  Social  Connections: Moderately Integrated (06/18/2023)   Social Connection and Isolation Panel [NHANES]    Frequency of Communication with Friends and Family: More than three times a week    Frequency of Social Gatherings with Friends and Family: More than three times a week    Attends Religious Services: More than 4 times per year    Active Member of [employer] or Organizations: Yes    Attends Engineer, structural: More than 4 times per year    Marital Status: Divorced  Catering manager Violence: Not on file    Review of Systems  Constitutional:  Negative for fatigue and unexpected weight change.  Eyes:  Negative for visual disturbance.  Respiratory:  Negative for cough, chest tightness and shortness of breath.   Cardiovascular:  Negative for chest pain, palpitations and leg swelling.  Gastrointestinal:  Negative for abdominal pain and blood in stool.  Neurological:  Negative for dizziness, light-headedness and headaches.     Objective:   Vitals:   06/25/23 0834  BP: 112/68  Pulse: 85  Temp: 98 F (36.7 C)  TempSrc: Temporal  SpO2: 96%  Weight: (!) 331 lb 3.2 oz (150.2 kg)  Height: 5\' 9"  (1.753 m)     Physical Exam Vitals reviewed.  Constitutional:      Appearance: He is well-developed.  HENT:     Head: Normocephalic and atraumatic.  Neck:     Vascular: No carotid bruit or JVD.  Cardiovascular:     Rate and Rhythm: Normal rate and regular rhythm.     Heart sounds: Normal heart sounds. No murmur heard. Pulmonary:     Effort: Pulmonary effort is normal.     Breath sounds: Normal breath sounds. No rales.  Abdominal:     Comments: Describes area discomfort after right inguinal fold but no hernia palpated.  No bulge, no skin changes.  Musculoskeletal:     Right lower leg: No edema.     Left lower leg: No edema.     Comments: Stasis changes at the lower legs bilaterally but no pedal edema appreciated.  Skin:    General: Skin is warm and dry.  Neurological:     Mental  Status: He is alert and oriented to person, place, and time.  Psychiatric:        Mood and Affect: Mood normal.        Assessment & Plan:  Zaydrian Batta. is a 63 y.o. male . Type 2 diabetes mellitus with hyperglycemia, without long-term current use of insulin (HCC) - Plan: Comprehensive metabolic panel  -Doing well, improving A1c by report, weight has improved.  Commended on his efforts.  Continue follow-up with endocrinology as planned.  Hyperlipidemia, unspecified hyperlipidemia type - Plan: Lipid panel, Comprehensive metabolic panel  -Tolerating every other day statin, continue same for now with labs as above.  Fasting today.  Adjustment of plan accordingly  CLL (chronic lymphocytic leukemia) (HCC)  -Reports stable counts, continue follow-up with hematology  Peripheral edema - Plan: Comprehensive metabolic panel  -Improved, I do not appreciate any edema on exam today.  Will try to back off the furosemide to half pill at night, continue full pill in the morning.  Can continue to taper down dose if that is stable.  Return to full dose if edema returns.  21-month follow-up.  Right inguinal hernia History of hernia, right inguinal, not palpated on exam.  Intermittent discomfort but declines meeting with surgeon at  this time.  ER precautions given, hernia precautions and handout given.  No orders of the defined types were placed in this encounter.  Patient Instructions  Randie Heinz work on the weight loss!  Keep follow up with endocrine as planned.  Swelling looks good - you can try 1/2 dose of lasix at night, full dose in am for now. Go back to full dose if swelling worsens.  If that dose works, let me know and I can change the prescription.  If any concerns on labs I will let you know.  See information below on hernia.  I do not feel any concerns on exam today but if you do continue to have discomfort in the area would recommend meeting with surgeon.  Thanks for coming in today and  take care!  Hernia, Adult     A hernia happens when an organ or tissue inside your body pushes out through a weak spot in the muscles of your belly. This makes a bulge. The bulge may be: In a scar from surgery. This type of bulge is called an incisional hernia. Near your belly button. This type is called an umbilical hernia. In your groin, which is the area where your leg meets your lower belly. This type is called an inguinal hernia. If you're male, this type could also be in your scrotum. In your upper thigh. This type is called a femoral hernia. Inside your belly. This type is called a hiatal hernia. It happens when your stomach slides above your diaphragm, which is the muscle between your belly and your chest. What are the causes? You may get a hernia if: You lift heavy things. You cough over a long period of time. You have trouble pooping, also called constipation. Trouble pooping can lead to straining. There's a cut from surgery in your belly. You have a problem that's present at birth. There's fluid around your belly. You're male and have a testicle that hasn't moved down into your scrotum. You may be at greater risk for hernia if: You smoke. You're very overweight. What are the signs or symptoms? The main symptom is a bulge, but the bulge may not always be seen. It may grow bigger or be easier to see when you cough or strain, such as when you lift something heavy. If you can push the bulge back into your belly, it may not cause pain. If you can't push it back into your belly, it may lose its blood supply. This can cause: Pain. Fever. Nausea and vomiting. Swelling. Trouble pooping. How is this diagnosed? A hernia may be diagnosed based on your symptoms, medical history, and an exam. Your health care provider may ask you to cough or move in a way that makes the bulge easier to see. You may also have tests done. These may include: X-rays. Ultrasound. CT scan. How is this  treated? A hernia that's small and painless may not need to be treated. A hernia that's large or painful may be treated with surgery. Surgery involves pushing the bulge back into place and fixing the weak area of the muscle or belly. Follow these instructions at home: Activity Try not to strain. You may have to avoid lifting. Ask how much weight you can safely lift. If you lift something heavy, use your leg muscles. Do not use your back muscles to lift. Prevent trouble pooping You may need to take these actions to prevent or treat trouble pooping: Drink enough fluid to keep your pee (urine) pale  yellow. Take medicines to help you poop. Eat foods high in fiber. These include beans, whole grains, and fresh fruits and vegetables. General instructions When you cough, try to cough gently. Try to push the bulge back in by very gently pressing on it when you're lying down. Do not try to force it back in if it won't push in easily. If you're overweight, work with your provider to lose weight safely. Do not smoke, vape, or use products with nicotine or tobacco in them. If you need help quitting, talk with your provider. If you're going to have surgery, watch your hernia for changes in shape, size, or color. Tell your provider about any changes. Contact a health care provider if: You get new pain, swelling, or redness near your hernia. You have trouble pooping. Your poop is harder or larger than normal. You have a fever or chills. You have nausea or vomiting. Your hernia can't be pushed in. Get help right away if: You have belly pain that gets worse. Your hernia: Changes in shape or size. Changes color. Feels hard or hurts when you touch it. These symptoms may be an emergency. Call 911 right away. Do not wait to see if the symptoms will go away. Do not drive yourself to the hospital. This information is not intended to replace advice given to you by your health care provider. Make sure you  discuss any questions you have with your health care provider. Document Revised: 01/18/2023 Document Reviewed: 07/11/2022 Elsevier Patient Education  2024 Elsevier Inc.      Signed,   Meredith Staggers, MD [facility], [facility] 06/25/23 9:04 AM

## 2023-06-26 ENCOUNTER — Encounter: Payer: Self-pay | Admitting: Family Medicine

## 2023-07-09 ENCOUNTER — Encounter: Payer: Self-pay | Admitting: Family Medicine

## 2023-07-10 ENCOUNTER — Other Ambulatory Visit: Payer: Self-pay | Admitting: "Endocrinology

## 2023-07-10 DIAGNOSIS — E1165 Type 2 diabetes mellitus with hyperglycemia: Secondary | ICD-10-CM

## 2023-07-17 ENCOUNTER — Other Ambulatory Visit (HOSPITAL_BASED_OUTPATIENT_CLINIC_OR_DEPARTMENT_OTHER): Payer: Self-pay

## 2023-07-31 ENCOUNTER — Other Ambulatory Visit (HOSPITAL_BASED_OUTPATIENT_CLINIC_OR_DEPARTMENT_OTHER): Payer: Self-pay

## 2023-08-03 ENCOUNTER — Other Ambulatory Visit: Payer: Self-pay | Admitting: Family Medicine

## 2023-08-08 ENCOUNTER — Other Ambulatory Visit: Payer: Self-pay | Admitting: "Endocrinology

## 2023-08-14 ENCOUNTER — Other Ambulatory Visit (HOSPITAL_BASED_OUTPATIENT_CLINIC_OR_DEPARTMENT_OTHER): Payer: Self-pay

## 2023-08-15 ENCOUNTER — Other Ambulatory Visit (HOSPITAL_BASED_OUTPATIENT_CLINIC_OR_DEPARTMENT_OTHER): Payer: Self-pay

## 2023-08-15 ENCOUNTER — Other Ambulatory Visit: Payer: Self-pay | Admitting: Family Medicine

## 2023-08-16 ENCOUNTER — Other Ambulatory Visit (HOSPITAL_BASED_OUTPATIENT_CLINIC_OR_DEPARTMENT_OTHER): Payer: Self-pay

## 2023-08-16 MED ORDER — DEXCOM G7 SENSOR MISC
1.0000 | 0 refills | Status: DC
  Filled 2023-08-16: qty 9, 90d supply, fill #0

## 2023-08-17 ENCOUNTER — Other Ambulatory Visit: Payer: Self-pay | Admitting: Family Medicine

## 2023-08-17 DIAGNOSIS — E1165 Type 2 diabetes mellitus with hyperglycemia: Secondary | ICD-10-CM

## 2023-09-05 ENCOUNTER — Encounter: Payer: Self-pay | Admitting: "Endocrinology

## 2023-09-13 ENCOUNTER — Ambulatory Visit: Admitting: "Endocrinology

## 2023-09-13 ENCOUNTER — Encounter: Payer: Self-pay | Admitting: "Endocrinology

## 2023-09-13 ENCOUNTER — Other Ambulatory Visit: Payer: Self-pay | Admitting: "Endocrinology

## 2023-09-13 VITALS — BP 124/80 | HR 99 | Ht 69.0 in | Wt 326.0 lb

## 2023-09-13 DIAGNOSIS — Z7985 Long-term (current) use of injectable non-insulin antidiabetic drugs: Secondary | ICD-10-CM

## 2023-09-13 DIAGNOSIS — Z7984 Long term (current) use of oral hypoglycemic drugs: Secondary | ICD-10-CM

## 2023-09-13 DIAGNOSIS — E7800 Pure hypercholesterolemia, unspecified: Secondary | ICD-10-CM

## 2023-09-13 DIAGNOSIS — Z794 Long term (current) use of insulin: Secondary | ICD-10-CM | POA: Diagnosis not present

## 2023-09-13 DIAGNOSIS — E1165 Type 2 diabetes mellitus with hyperglycemia: Secondary | ICD-10-CM

## 2023-09-13 LAB — POCT GLYCOSYLATED HEMOGLOBIN (HGB A1C): Hemoglobin A1C: 6.6 % — AB (ref 4.0–5.6)

## 2023-09-13 NOTE — Patient Instructions (Signed)

## 2023-09-13 NOTE — Progress Notes (Signed)
 Outpatient Endocrinology Note Henry Newcomer, MD  09/13/23   Valora Gear Haskell Markuson 1960/05/29 [id number]  Referring Provider: Benjiman Bras, MD Primary Care Provider: Benjiman Bras, MD Reason for consultation: Subjective   Assessment & Plan  Diagnoses and all orders for this visit:  Uncontrolled type 2 diabetes mellitus with hyperglycemia (HCC) -     POCT glycosylated hemoglobin (Hb A1C)  Long term (current) use of oral hypoglycemic drugs  Long-term (current) use of injectable non-insulin  antidiabetic drugs  Long-term insulin  use (HCC)  Pure hypercholesterolemia   Diabetes Type II complicated by hyperglycemia,  Lab Results  Component Value Date   GFR 93.83 06/25/2023   Hba1c goal less than 7, current Hba1c is  Lab Results  Component Value Date   HGBA1C 6.6 (A) 09/13/2023   Will recommend the following: Mounjaro  10 mg/week-max tolerated  Lantus  22 units once every morning - due to reported lows in 70s Synjardy  12.08/998 mg bid   No known contraindications/side effects to any of above medications  -Last LD and Tg are as follows: Lab Results  Component Value Date   LDLCALC 95 06/25/2023    Lab Results  Component Value Date   TRIG 120.0 06/25/2023   -On atorvastatin  10 mg every day vs M/W/F due to cramps -Follow low fat diet and exercise   -Blood pressure goal <140/90 - Microalbumin/creatinine goal is < 30 -Last MA/Cr is as follows: Lab Results  Component Value Date   MICROALBUR <0.7 02/01/2023   -not on ACE/ARB  -diet changes including salt restriction -limit eating outside -counseled BP targets per standards of diabetes care -uncontrolled blood pressure can lead to retinopathy, nephropathy and cardiovascular and atherosclerotic heart disease  Reviewed and counseled on: -A1C target -Blood sugar targets -Complications of uncontrolled diabetes  -Checking blood sugar before meals and bedtime and bring log next visit -All medications  with mechanism of action and side effects -Hypoglycemia management: rule of 15's, Glucagon Emergency Kit and medical alert ID -low-carb low-fat plate-method diet -At least 20 minutes of physical activity per day -Annual dilated retinal eye exam and foot exam -compliance and follow up needs -follow up as scheduled or earlier if problem gets worse  Call if blood sugar is less than 70 or consistently above 250    Take a 15 gm snack of carbohydrate at bedtime before you go to sleep if your blood sugar is less than 100.    If you are going to fast after midnight for a test or procedure, ask your physician for instructions on how to reduce/decrease your insulin  dose.    Call if blood sugar is less than 70 or consistently above 250  -Treating a low sugar by rule of 15  (15 gms of sugar every 15 min until sugar is more than 70) If you feel your sugar is low, test your sugar to be sure If your sugar is low (less than 70), then take 15 grams of a fast acting Carbohydrate (3-4 glucose tablets or glucose gel or 4 ounces of juice or regular soda) Recheck your sugar 15 min after treating low to make sure it is more than 70 If sugar is still less than 70, treat again with 15 grams of carbohydrate          Don't drive the hour of hypoglycemia  If unconscious/unable to eat or drink by mouth, use glucagon injection or nasal spray baqsimi and call 911. Can repeat again in 15 min if still unconscious.  Return  in about 3 months (around 12/14/2023).   I have reviewed current medications, nurse's notes, allergies, vital signs, past medical and surgical history, family medical history, and social history for this encounter. Counseled patient on symptoms, examination findings, lab findings, imaging results, treatment decisions and monitoring and prognosis. The patient understood the recommendations and agrees with the treatment plan. All questions regarding treatment plan were fully answered.  Henry Newcomer, MD   09/13/23   History of Present Illness Henry Hinton. is a 63 y.o. year old male who presents for follow up for Type II diabetes mellitus.  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   Diabetes education -  Home diabetes regimen: Mounjaro  10 mg/week Lantus  26 units once every morning Synjardy  12.08/998 mg bid   Previously Trulicity  4.5 mg weekly   COMPLICATIONS -  MI/Stroke -  retinopathy -  neuropathy -  nephropathy  BLOOD SUGAR DATA  CGM interpretation: At today's visit, we reviewed her CGM downloads. The full report is scanned in the media. Reviewing the CGM trends, BG are well controlled across the day.   Physical Exam  BP 124/80   Pulse 99   Ht 5\' 9"  (1.753 m)   Wt (!) 326 lb (147.9 kg)   SpO2 98%   BMI 48.14 kg/m    Constitutional: well developed, well nourished Head: normocephalic, atraumatic Eyes: sclera anicteric, no redness Neck: supple Lungs: normal respiratory effort Neurology: alert and oriented Skin: dry, no appreciable rashes Musculoskeletal: no appreciable defects Psychiatric: normal mood and affect Diabetic Foot Exam - Simple   No data filed      Current Medications Patient's Medications  New Prescriptions   No medications on file  Previous Medications   ASCORBIC ACID  (VITAMIN C) 1000 MG TABLET    Take 1,000 mg by mouth daily.   ASPIRIN  EC 81 MG TABLET    Take 81 mg by mouth daily.   ATORVASTATIN  (LIPITOR) 10 MG TABLET    Take 1 tablet (10 mg total) by mouth daily.   CONTINUOUS GLUCOSE RECEIVER (DEXCOM G7 RECEIVER) DEVI    Use to monitor glucose   CONTINUOUS GLUCOSE SENSOR (DEXCOM G7 SENSOR) MISC    Apply to skin as directed every 10 days.   EMPAGLIFLOZIN -METFORMIN  HCL (SYNJARDY ) 12.08-998 MG TABS    Take 1 tablet by mouth 2 (two) times daily.   FERROUS SULFATE  325 (65 FE) MG TABLET    Take 325 mg by mouth daily with breakfast.   FLUTICASONE  (FLONASE ) 50 MCG/ACT NASAL SPRAY    Place 2 sprays into both nostrils daily.    FUROSEMIDE  (LASIX ) 80 MG TABLET    TAKE 1 TABLET BY MOUTH TWICE A DAY   IBUPROFEN  (ADVIL ,MOTRIN ) 200 MG TABLET    Take 600 mg by mouth every 6 (six) hours as needed for mild pain.   INSULIN  GLARGINE (LANTUS  SOLOSTAR) 100 UNIT/ML SOLOSTAR PEN    Inject 28 Units into the skin daily. + sliding scale, max dose 40 units a day   INSULIN  PEN NEEDLE (PEN NEEDLES) 31G X 6 MM MISC    1 Needle by Does not apply route daily.   IPRATROPIUM (ATROVENT ) 0.06 % NASAL SPRAY    Place 1-2 sprays into both nostrils 4 (four) times daily. As needed for nasal congestion   KLOR-CON  M20 20 MEQ TABLET    TAKE 1 TABLET BY MOUTH TWICE A DAY   MELOXICAM (MOBIC) 15 MG TABLET    Take 15 mg by mouth daily.   METFORMIN  (  GLUCOPHAGE ) 1000 MG TABLET    TAKE 1 TABLET BY MOUTH TWICE A DAY WITH FOOD   MISC NATURAL PRODUCTS (TRANSFERON) CAPS       MULTIPLE VITAMINS-MINERALS (MULTIVITAMIN WITH MINERALS) TABLET    Take 1 tablet by mouth daily.   TIRZEPATIDE  (MOUNJARO ) 10 MG/0.5ML PEN    Inject 10 mg into the skin once a week.   ZINC 15 MG CAPS    daily.  Modified Medications   No medications on file  Discontinued Medications   No medications on file    Allergies Allergies  Allergen Reactions   Adhesive [Tape] Other (See Comments)    Blisters and rash at site   Other     Past Medical History Past Medical History:  Diagnosis Date   Anemia    Arthritis    BMI 50.0-59.9, adult (HCC)    Cellulitis 11/2015   trunk   cll 11/2014   CLL - 11/30/2014- Dr. Terrill [facility] [place],    DVT (deep venous thrombosis) (HCC) 6/16   Rt calf  took xarelto  for 6 months   Family history of anesthesia complication    " father having delirium after anesthesia"   Heart murmur    Hx; of as a child   History of kidney stones    x2   Joint pain    Hx: of periodically   Numbness and tingling    Hx: of in right arm   Pneumonia    Sleep apnea    Cpap use- settings 10    Past Surgical History Past Surgical History:   Procedure Laterality Date   BACK SURGERY     COLONOSCOPY     Hx: of   COLONOSCOPY N/A 11/23/2015   Procedure: COLONOSCOPY;  Surgeon: Tobin Forts, MD;  Location: [facility];  Service: Endoscopy;  Laterality: N/A;   HERNIA REPAIR N/A    [employer]reesia 03/19/2020   INGUINAL HERNIA REPAIR Left 06/01/2015   Procedure: LEFT INGUINAL HERNIA REPAIR WITH MESH;  Surgeon: Lillette Reid III, MD;  Location: [facility];  Service: General;  Laterality: Left;   INGUINAL HERNIA REPAIR     INSERTION OF MESH Left 06/01/2015   Procedure: INSERTION OF MESH;  Surgeon: Lillette Reid III, MD;  Location: [facility];  Service: General;  Laterality: Left;   JOINT REPLACEMENT Bilateral    BTHA   KNEE ARTHROSCOPY Left 07/21/2014   Dr. Jinger Mount   LUMBAR LAMINECTOMY  2005   POSTERIOR CERVICAL LAMINECTOMY WITH MET- RX Right 01/21/2013   Procedure: Right Sitting C6-7 Microdiskectomy with Met-rex;  Surgeon: Elna Haggis, MD;  Location: [facility];  Service: Neurosurgery;  Laterality: Right;  Right Sitting C6-7 Microdiskectomy with Met-rex   SPINE SURGERY N/A    [employer]reesia 03/19/2020   STENT PLACEMENT RT URETER ([facility] HX)  10/23/2014   tenosynovitis  2000   Hx: of thumb   TONSILLECTOMY     TOTAL HIP ARTHROPLASTY Bilateral    WISDOM TOOTH EXTRACTION  1974   all 4    Family History family history includes COPD in his maternal grandmother; Diabetes in his father; Heart disease in his maternal grandfather; Lung cancer in his paternal grandfather; Prostate cancer in his father.  Social History Social History   Socioeconomic History   Marital status: Divorced    Spouse name: Not on file   Number of children: 0   Years of education: Not on file   Highest education level: Master's degree (e.g., MA, MS, MEng, MEd, MSW, MBA)  Occupational  History   Occupation: Psychologist, counselling: [employer]  Tobacco Use   Smoking status: Never   Smokeless tobacco: Former    Types: Chew    Quit date: 05/01/1993  Vaping Use    Vaping status: Never Used  Substance and Sexual Activity   Alcohol use: Yes    Alcohol/week: 8.0 standard drinks of alcohol    Types: 4 Cans of beer, 4 Shots of liquor per week    Comment: BEER AND LIQUOR   Drug use: No   Sexual activity: Not Currently  Other Topics Concern   Not on file  Social History Narrative   Exercise swimming 3-5 times/week for 45 minutes   Social Drivers of Corporate investment banker Strain: Low Risk  (06/18/2023)   Overall Financial Resource Strain (CARDIA)    Difficulty of Paying Living Expenses: Not very hard  Food Insecurity: No Food Insecurity (06/18/2023)   Hunger Vital Sign    Worried About Running Out of Food in the Last Year: Never true    Ran Out of Food in the Last Year: Never true  Transportation Needs: No Transportation Needs (06/18/2023)   PRAPARE - Administrator, Civil Service (Medical): No    Lack of Transportation (Non-Medical): No  Physical Activity: Sufficiently Active (06/18/2023)   Exercise Vital Sign    Days of Exercise per Week: 4 days    Minutes of Exercise per Session: 80 min  Stress: No Stress Concern Present (06/18/2023)   [employer] of Occupational Health - Occupational Stress Questionnaire    Feeling of Stress : Not at all  Social Connections: Moderately Integrated (06/18/2023)   Social Connection and Isolation Panel [NHANES]    Frequency of Communication with Friends and Family: More than three times a week    Frequency of Social Gatherings with Friends and Family: More than three times a week    Attends Religious Services: More than 4 times per year    Active Member of Clubs or Organizations: Yes    Attends Banker Meetings: More than 4 times per year    Marital Status: Divorced  Intimate Partner Violence: Not on file    Lab Results  Component Value Date   HGBA1C 6.6 (A) 09/13/2023   HGBA1C 9.5 (H) 02/01/2023   HGBA1C 9.9 (H) 12/20/2022   Lab Results  Component Value Date   CHOL  174 06/25/2023   Lab Results  Component Value Date   HDL 55.30 06/25/2023   Lab Results  Component Value Date   LDLCALC 95 06/25/2023   Lab Results  Component Value Date   TRIG 120.0 06/25/2023   Lab Results  Component Value Date   CHOLHDL 3 06/25/2023   Lab Results  Component Value Date   CREATININE 0.82 06/25/2023   Lab Results  Component Value Date   GFR 93.83 06/25/2023   Lab Results  Component Value Date   MICROALBUR <0.7 02/01/2023      Component Value Date/Time   NA 142 06/25/2023 0904   NA 138 03/18/2020 0809   K 4.2 06/25/2023 0904   CL 102 06/25/2023 0904   CO2 28 06/25/2023 0904   GLUCOSE 139 (H) 06/25/2023 0904   BUN 17 06/25/2023 0904   BUN 13 03/18/2020 0809   CREATININE 0.82 06/25/2023 0904   CREATININE 0.71 02/24/2016 1603   CALCIUM  8.8 06/25/2023 0904   PROT 7.5 06/25/2023 0904   PROT 6.4 03/18/2020 0809   ALBUMIN 4.6 06/25/2023  0904   ALBUMIN 4.1 03/18/2020 0809   AST 19 06/25/2023 0904   ALT 23 06/25/2023 0904   ALKPHOS 103 06/25/2023 0904   BILITOT 0.6 06/25/2023 0904   BILITOT 0.4 03/18/2020 0809   GFRNONAA 100 03/18/2020 0809   GFRNONAA >89 02/24/2016 1603   GFRAA 116 03/18/2020 0809   GFRAA >89 02/24/2016 1603      Latest Ref Rng & Units 06/25/2023    9:04 AM 02/01/2023    9:05 AM 12/20/2022    9:41 AM  BMP  Glucose 70 - 99 mg/dL 161  096  045   BUN 6 - 23 mg/dL 17  20  17    Creatinine 0.40 - 1.50 mg/dL 4.09  8.11  9.14   Sodium 135 - 145 mEq/L 142  139  138   Potassium 3.5 - 5.1 mEq/L 4.2  4.6  4.2   Chloride 96 - 112 mEq/L 102  104  99   CO2 19 - 32 mEq/L 28  28  28    Calcium  8.4 - 10.5 mg/dL 8.8  8.3  8.7        Component Value Date/Time   WBC 75.0 (HH) 05/21/2019 0640   RBC 4.24 05/21/2019 0640   HGB 11.3 (L) 05/21/2019 0640   HCT 37.8 (L) 05/21/2019 0640   PLT 207 05/21/2019 0640   MCV 89.2 05/21/2019 0640   MCV 83.7 12/10/2017 1608   MCH 26.7 05/21/2019 0640   MCHC 29.9 (L) 05/21/2019 0640   RDW 15.1  05/21/2019 0640   LYMPHSABS 68.2 (H) 05/21/2019 0640   MONOABS 0.8 05/21/2019 0640   EOSABS 0.0 05/21/2019 0640   BASOSABS 0.1 05/21/2019 0640     Parts of this note may have been dictated using voice recognition software. There may be variances in spelling and vocabulary which are unintentional. Not all errors are proofread. Please notify the [facility] if any discrepancies are noted or if the meaning of any statement is not clear.

## 2023-09-14 ENCOUNTER — Encounter: Payer: Self-pay | Admitting: "Endocrinology

## 2023-09-27 ENCOUNTER — Other Ambulatory Visit: Payer: Self-pay | Admitting: "Endocrinology

## 2023-09-28 ENCOUNTER — Other Ambulatory Visit (HOSPITAL_BASED_OUTPATIENT_CLINIC_OR_DEPARTMENT_OTHER): Payer: Self-pay

## 2023-09-28 MED ORDER — SYNJARDY 12.5-1000 MG PO TABS
1.0000 | ORAL_TABLET | Freq: Two times a day (BID) | ORAL | 3 refills | Status: DC
Start: 2023-09-28 — End: 2024-01-27
  Filled 2023-09-28: qty 60, 30d supply, fill #0
  Filled 2023-10-30: qty 60, 30d supply, fill #1
  Filled 2023-11-26: qty 60, 30d supply, fill #2
  Filled 2023-12-26: qty 60, 30d supply, fill #3

## 2023-09-28 NOTE — Telephone Encounter (Signed)
 Requested Prescriptions   Pending Prescriptions Disp Refills   Empagliflozin -metFORMIN  HCl (SYNJARDY ) 12.08-998 MG TABS 60 tablet 3    Sig: Take 1 tablet by mouth 2 (two) times daily.

## 2023-10-08 ENCOUNTER — Other Ambulatory Visit: Payer: Self-pay | Admitting: "Endocrinology

## 2023-10-09 ENCOUNTER — Other Ambulatory Visit (HOSPITAL_BASED_OUTPATIENT_CLINIC_OR_DEPARTMENT_OTHER): Payer: Self-pay

## 2023-10-09 MED ORDER — MOUNJARO 10 MG/0.5ML ~~LOC~~ SOAJ
10.0000 mg | SUBCUTANEOUS | 3 refills | Status: DC
  Filled 2023-10-09: qty 2, 28d supply, fill #0
  Filled 2023-11-03: qty 2, 28d supply, fill #1
  Filled 2023-12-02: qty 2, 28d supply, fill #2
  Filled 2024-01-01: qty 2, 28d supply, fill #3

## 2023-10-09 NOTE — Telephone Encounter (Signed)
 Requested Prescriptions   Pending Prescriptions Disp Refills   tirzepatide  (MOUNJARO ) 10 MG/0.5ML Pen 2 mL 3    Sig: Inject 10 mg into the skin once a week.

## 2023-10-30 ENCOUNTER — Encounter (HOSPITAL_BASED_OUTPATIENT_CLINIC_OR_DEPARTMENT_OTHER): Payer: Self-pay

## 2023-10-30 ENCOUNTER — Other Ambulatory Visit: Payer: Self-pay | Admitting: Family Medicine

## 2023-10-31 ENCOUNTER — Other Ambulatory Visit (HOSPITAL_BASED_OUTPATIENT_CLINIC_OR_DEPARTMENT_OTHER): Payer: Self-pay

## 2023-10-31 ENCOUNTER — Other Ambulatory Visit: Payer: Self-pay

## 2023-10-31 MED ORDER — DEXCOM G7 SENSOR MISC
1.0000 | 0 refills | Status: DC
Start: 2023-10-31 — End: 2024-01-27
  Filled 2023-10-31: qty 9, 90d supply, fill #0

## 2023-11-05 ENCOUNTER — Other Ambulatory Visit (HOSPITAL_BASED_OUTPATIENT_CLINIC_OR_DEPARTMENT_OTHER): Payer: Self-pay

## 2023-11-06 ENCOUNTER — Other Ambulatory Visit (HOSPITAL_BASED_OUTPATIENT_CLINIC_OR_DEPARTMENT_OTHER): Payer: Self-pay

## 2023-11-06 ENCOUNTER — Encounter: Payer: Self-pay | Admitting: Family Medicine

## 2023-11-12 ENCOUNTER — Other Ambulatory Visit: Payer: Self-pay | Admitting: Family Medicine

## 2023-11-12 DIAGNOSIS — R600 Localized edema: Secondary | ICD-10-CM

## 2023-12-06 ENCOUNTER — Other Ambulatory Visit (HOSPITAL_BASED_OUTPATIENT_CLINIC_OR_DEPARTMENT_OTHER): Payer: Self-pay

## 2023-12-11 ENCOUNTER — Other Ambulatory Visit (HOSPITAL_BASED_OUTPATIENT_CLINIC_OR_DEPARTMENT_OTHER): Payer: Self-pay

## 2023-12-11 MED ORDER — MELOXICAM 15 MG PO TABS
15.0000 mg | ORAL_TABLET | Freq: Every day | ORAL | 0 refills | Status: DC | PRN
  Filled 2023-12-11: qty 30, 30d supply, fill #0

## 2023-12-17 ENCOUNTER — Encounter: Payer: Self-pay | Admitting: "Endocrinology

## 2023-12-17 ENCOUNTER — Other Ambulatory Visit (HOSPITAL_BASED_OUTPATIENT_CLINIC_OR_DEPARTMENT_OTHER): Payer: Self-pay

## 2023-12-17 ENCOUNTER — Telehealth (INDEPENDENT_AMBULATORY_CARE_PROVIDER_SITE_OTHER): Admitting: "Endocrinology

## 2023-12-17 ENCOUNTER — Encounter: Payer: Self-pay | Admitting: *Deleted

## 2023-12-17 DIAGNOSIS — Z794 Long term (current) use of insulin: Secondary | ICD-10-CM | POA: Diagnosis not present

## 2023-12-17 DIAGNOSIS — Z7985 Long-term (current) use of injectable non-insulin antidiabetic drugs: Secondary | ICD-10-CM | POA: Diagnosis not present

## 2023-12-17 DIAGNOSIS — Z7984 Long term (current) use of oral hypoglycemic drugs: Secondary | ICD-10-CM

## 2023-12-17 DIAGNOSIS — E1165 Type 2 diabetes mellitus with hyperglycemia: Secondary | ICD-10-CM

## 2023-12-17 DIAGNOSIS — E7800 Pure hypercholesterolemia, unspecified: Secondary | ICD-10-CM

## 2023-12-17 MED ORDER — PRAVASTATIN SODIUM 10 MG PO TABS
10.0000 mg | ORAL_TABLET | Freq: Every day | ORAL | 1 refills | Status: DC
  Filled 2023-12-17: qty 90, 90d supply, fill #0
  Filled 2024-03-13: qty 90, 90d supply, fill #1

## 2023-12-17 NOTE — Progress Notes (Signed)
 The patient reports they are currently: Henry Hinton. I spent 8-9 minutes on the video with the patient on the date of service. I spent an additional 10 minutes on pre- and post-visit activities on the date of service.   The patient was physically located in [place]  or a state in which I am permitted to provide care. The patient and/or parent/guardian understood that s/he may incur co-pays and cost sharing, and agreed to the telemedicine visit. The visit was reasonable and appropriate under the circumstances given the patient's presentation at the time.  The patient and/or parent/guardian has been advised of the potential risks and limitations of this mode of treatment (including, but not limited to, the absence of in-person examination) and has agreed to be treated using telemedicine. The patient's/patient's family's questions regarding telemedicine have been answered.   The patient and/or parent/guardian has also been advised to contact their provider's office for worsening conditions, and seek emergency medical treatment and/or call 911 if the patient deems either necessary.    Outpatient Endocrinology Note [name], MD  12/17/23   Henry Hinton 27-May-1960 [id number]  Referring Provider: Levora Reyes SAUNDERS, MD Primary Care Provider: Levora Reyes SAUNDERS, MD Reason for consultation: Subjective   Assessment & Plan  Diagnoses and all orders for this visit:  Type 2 diabetes mellitus with hyperglycemia, with long-term current use of insulin  (HCC) -     Hemoglobin A1c -     Comprehensive metabolic panel with GFR -     Lipid panel -     Microalbumin / creatinine urine ratio  Long term (current) use of oral hypoglycemic drugs  Long-term (current) use of injectable non-insulin  antidiabetic drugs  Long-term insulin  use (HCC)  Pure hypercholesterolemia  Other orders -     pravastatin  (PRAVACHOL ) 10 MG tablet; Take 1 tablet (10 mg total) by mouth daily.   Diabetes Type II  complicated by hyperglycemia,  Lab Results  Component Value Date   GFR 93.83 06/25/2023   Hba1c goal less than 7, current Hba1c is  Lab Results  Component Value Date   HGBA1C 6.6 (A) 09/13/2023   Will recommend the following: Mounjaro  10 mg/week-max tolerated  Lantus  22 units once every morning Synjardy  12.08/998 mg bid   No known contraindications/side effects to any of above medications  -Last LD and Tg are as follows: Lab Results  Component Value Date   LDLCALC 95 06/25/2023    Lab Results  Component Value Date   TRIG 120.0 06/25/2023   - Stopped atorvastatin  10 mg M/W/F due to cramps -12/17/2023: Start pravastatin  10 mg every other day, if well-tolerated make it every day -Follow low fat diet and exercise   -Blood pressure goal <140/90 - Microalbumin/creatinine goal is < 30 -Last MA/Cr is as follows: Lab Results  Component Value Date   MICROALBUR 0.7 02/24/2016   -not on ACE/ARB  -diet changes including salt restriction -limit eating outside -counseled BP targets per standards of diabetes care -uncontrolled blood pressure can lead to retinopathy, nephropathy and cardiovascular and atherosclerotic heart disease  Reviewed and counseled on: -A1C target -Blood sugar targets -Complications of uncontrolled diabetes  -Checking blood sugar before meals and bedtime and bring log next visit -All medications with mechanism of action and side effects -Hypoglycemia management: rule of 15's, Glucagon Emergency Kit and medical alert ID -low-carb low-fat plate-method diet -At least 20 minutes of physical activity per day -Annual dilated retinal eye exam and foot exam -compliance and follow up needs -follow up as scheduled  or earlier if problem gets worse  Call if blood sugar is less than 70 or consistently above 250    Take a 15 gm snack of carbohydrate at bedtime before you go to sleep if your blood sugar is less than 100.    If you are going to fast after midnight for  a test or procedure, ask your physician for instructions on how to reduce/decrease your insulin  dose.    Call if blood sugar is less than 70 or consistently above 250  -Treating a low sugar by rule of 15  (15 gms of sugar every 15 min until sugar is more than 70) If you feel your sugar is low, test your sugar to be sure If your sugar is low (less than 70), then take 15 grams of a fast acting Carbohydrate (3-4 glucose tablets or glucose gel or 4 ounces of juice or regular soda) Recheck your sugar 15 min after treating low to make sure it is more than 70 If sugar is still less than 70, treat again with 15 grams of carbohydrate          Don't drive the hour of hypoglycemia  If unconscious/unable to eat or drink by mouth, use glucagon injection or nasal spray baqsimi and call 911. Can repeat again in 15 min if still unconscious.  Return in about 4 months (around 04/17/2024) for tele-visit: 3:20 pm visit and 8 am labs before next visit.   I have reviewed current medications, nurse's notes, allergies, vital signs, past medical and surgical history, family medical history, and social history for this encounter. Counseled patient on symptoms, examination findings, lab findings, imaging results, treatment decisions and monitoring and prognosis. The patient understood the recommendations and agrees with the treatment plan. All questions regarding treatment plan were fully answered.  [name], MD  12/17/23   History of Present Illness Henry Ray Sixto Bowdish. is a 63 y.o. year old male who presents for follow up for Type II diabetes mellitus.  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   Diabetes education -  Home diabetes regimen: Mounjaro  10 mg/week Lantus  26 units once every morning Synjardy  12.08/998 mg bid   Previously Trulicity  4.5 mg weekly   COMPLICATIONS -  MI/Stroke -  retinopathy -  neuropathy -  nephropathy  BLOOD SUGAR DATA CGM interpretation: At today's visit, we  reviewed her CGM downloads. The full report is scanned in the media. Reviewing the CGM trends, BG are well controlled across the day with some high in the day.  Physical Exam  There were no vitals taken for this visit.   Constitutional: well developed, well nourished Head: normocephalic, atraumatic Eyes: sclera anicteric, no redness Neck: supple Lungs: normal respiratory effort Neurology: alert and oriented Skin: dry, no appreciable rashes Musculoskeletal: no appreciable defects Psychiatric: normal mood and affect Diabetic Foot Exam - Simple   No data filed      Current Medications Patient's Medications  New Prescriptions   PRAVASTATIN  (PRAVACHOL ) 10 MG TABLET    Take 1 tablet (10 mg total) by mouth daily.  Previous Medications   ASCORBIC ACID  (VITAMIN C) 1000 MG TABLET    Take 1,000 mg by mouth daily.   ASPIRIN  EC 81 MG TABLET    Take 81 mg by mouth daily.   CONTINUOUS GLUCOSE RECEIVER (DEXCOM G7 RECEIVER) DEVI    Use to monitor glucose   CONTINUOUS GLUCOSE SENSOR (DEXCOM G7 SENSOR) MISC    Apply to skin as directed every 10  days.   EMPAGLIFLOZIN -METFORMIN  HCL (SYNJARDY ) 12.08-998 MG TABS    Take 1 tablet by mouth 2 (two) times daily.   FERROUS SULFATE  325 (65 FE) MG TABLET    Take 325 mg by mouth daily with breakfast.   FLUTICASONE  (FLONASE ) 50 MCG/ACT NASAL SPRAY    Place 2 sprays into both nostrils daily.   FUROSEMIDE  (LASIX ) 80 MG TABLET    TAKE 1 TABLET BY MOUTH TWICE A DAY   IBUPROFEN  (ADVIL ,MOTRIN ) 200 MG TABLET    Take 600 mg by mouth every 6 (six) hours as needed for mild pain.   INSULIN  GLARGINE (LANTUS  SOLOSTAR) 100 UNIT/ML SOLOSTAR PEN    INJECT 28 UNITS INTO THE SKIN DAILY. + SLIDING SCALE, MAX DOSE 40 UNITS A DAY   INSULIN  PEN NEEDLE (PEN NEEDLES) 31G X 6 MM MISC    1 Needle by Does not apply route daily.   IPRATROPIUM (ATROVENT ) 0.06 % NASAL SPRAY    Place 1-2 sprays into both nostrils 4 (four) times daily. As needed for nasal congestion   MELOXICAM  (MOBIC ) 15 MG  TABLET    Take 15 mg by mouth daily.   MELOXICAM  (MOBIC ) 15 MG TABLET    Take 1 tablet by mouth once a day as needed   METFORMIN  (GLUCOPHAGE ) 1000 MG TABLET    TAKE 1 TABLET BY MOUTH TWICE A DAY WITH FOOD   MISC NATURAL PRODUCTS (TRANSFERON) CAPS       MULTIPLE VITAMINS-MINERALS (MULTIVITAMIN WITH MINERALS) TABLET    Take 1 tablet by mouth daily.   POTASSIUM CHLORIDE  SA (KLOR-CON  M) 20 MEQ TABLET    TAKE 1 TABLET BY MOUTH TWICE A DAY   TIRZEPATIDE  (MOUNJARO ) 10 MG/0.5ML PEN    Inject 10 mg into the skin once a week.   ZINC 15 MG CAPS    daily.  Modified Medications   No medications on file  Discontinued Medications   ATORVASTATIN  (LIPITOR) 10 MG TABLET    Take 1 tablet (10 mg total) by mouth daily.    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 [facility] [place] [place]   DVT (deep venous thrombosis) (HCC) 6/16   Rt calf  took xarelto  for 6 months   Family history of anesthesia complication     father having delirium after anesthesia   Heart murmur    Hx; of as a child   History of kidney stones    x2   Joint pain    Hx: of periodically   Numbness and tingling    Hx: of in right arm   Pneumonia    Sleep apnea    Cpap use- settings 10    Past Surgical History Past Surgical History:  Procedure Laterality Date   BACK SURGERY     COLONOSCOPY     Hx: of   COLONOSCOPY N/A 11/23/2015   Procedure: COLONOSCOPY;  Surgeon: Norleen LOISE Kiang, MD;  Location: [facility];  Service: Endoscopy;  Laterality: N/A;   HERNIA REPAIR N/A    [employer] 03/19/2020   INGUINAL HERNIA REPAIR Left 06/01/2015   Procedure: LEFT INGUINAL HERNIA REPAIR WITH MESH;  Surgeon: Deward Null III, MD;  Location: [facility];  Service: General;   Laterality: Left;   INGUINAL HERNIA REPAIR     INSERTION OF MESH Left 06/01/2015   Procedure: INSERTION OF MESH;  Surgeon: Deward Null III, MD;  Location: [facility];  Service: General;  Laterality: Left;   JOINT REPLACEMENT Bilateral    BTHA   KNEE ARTHROSCOPY Left 07/21/2014   Dr. Jane   LUMBAR LAMINECTOMY  2005   POSTERIOR CERVICAL LAMINECTOMY WITH MET- RX Right 01/21/2013   Procedure: Right Sitting C6-7 Microdiskectomy with Met-rex;  Surgeon: Victory Gens, MD;  Location: [facility];  Service: Neurosurgery;  Laterality: Right;  Right Sitting C6-7 Microdiskectomy with Met-rex   SPINE SURGERY N/A    [facility]reesia 03/19/2020   STENT PLACEMENT RT URETER ([facility] HX)  10/23/2014   tenosynovitis  2000   Hx: of thumb   TONSILLECTOMY     TOTAL HIP ARTHROPLASTY Bilateral    WISDOM TOOTH EXTRACTION  1974   all 4    Family History family history includes COPD in his maternal grandmother; Diabetes in his father; Heart disease in his maternal grandfather; Lung cancer in his paternal grandfather; Prostate cancer in his father.  Social History Social History   Socioeconomic History   Marital status: Divorced    Spouse name: Not on file   Number of children: 0   Years of education: Not on file   Highest education level: Master's degree (e.g., MA, MS, MEng, MEd, MSW, MBA)  Occupational History   Occupation: Psychologist, counselling: [employer]  Tobacco Use   Smoking status: Never   Smokeless tobacco: Former    Types: Chew    Quit date: 05/01/1993  Vaping Use   Vaping status: Never Used  Substance and Sexual Activity   Alcohol use: Yes    Alcohol/week: 8.0 standard drinks of alcohol    Types: 4 Cans of beer, 4 Shots of liquor per week    Comment: BEER AND LIQUOR   Drug use: No   Sexual activity: Not Currently  Other Topics Concern   Not on file  Social History Narrative   Exercise swimming 3-5 times/week for 45 minutes   Social Drivers of Research scientist (physical sciences) Strain: Low Risk  (06/18/2023)   Overall Financial Resource Strain (CARDIA)    Difficulty of Paying Living Expenses: Not very hard  Food Insecurity: No Food Insecurity (06/18/2023)   Hunger Vital Sign    Worried About Running Out of Food in the Last Year: Never true    Ran Out of Food in the Last Year: Never true  Transportation Needs: No Transportation Needs (06/18/2023)   PRAPARE - Administrator, Civil Service (Medical): No    Lack of Transportation (Non-Medical): No  Physical Activity: Sufficiently Active (06/18/2023)   Exercise Vital Sign    Days of Exercise per Week: 4 days    Minutes of Exercise per Session: 80 min  Stress: No Stress Concern Present (06/18/2023)   [employer] of Occupational Health - Occupational Stress Questionnaire    Feeling of Stress : Not at all  Social Connections: Moderately Integrated (06/18/2023)   Social Connection and Isolation Panel    Frequency of Communication with Friends and Family: More than three times a week    Frequency of Social Gatherings with Friends and Family: More than three times a week    Attends Religious Services: More than 4 times per year    Active Member of [employer] or Organizations: Yes  Attends Club or Organization Meetings: More than 4 times per year    Marital Status: Divorced  Intimate Partner Violence: Not on file    Lab Results  Component Value Date   HGBA1C 6.6 (A) 09/13/2023   HGBA1C 9.5 (H) 02/01/2023   HGBA1C 9.9 (H) 12/20/2022   Lab Results  Component Value Date   CHOL 174 06/25/2023   Lab Results  Component Value Date   HDL 55.30 06/25/2023   Lab Results  Component Value Date   LDLCALC 95 06/25/2023   Lab Results  Component Value Date   TRIG 120.0 06/25/2023   Lab Results  Component Value Date   CHOLHDL 3 06/25/2023   Lab Results  Component Value Date   CREATININE 0.82 06/25/2023   Lab Results  Component Value Date   GFR 93.83 06/25/2023   Lab Results  Component  Value Date   MICROALBUR 0.7 02/24/2016      Component Value Date/Time   NA 142 06/25/2023 0904   NA 138 03/18/2020 0809   K 4.2 06/25/2023 0904   CL 102 06/25/2023 0904   CO2 28 06/25/2023 0904   GLUCOSE 139 (H) 06/25/2023 0904   BUN 17 06/25/2023 0904   BUN 13 03/18/2020 0809   CREATININE 0.82 06/25/2023 0904   CREATININE 0.71 02/24/2016 1603   CALCIUM  8.8 06/25/2023 0904   PROT 7.5 06/25/2023 0904   PROT 6.4 03/18/2020 0809   ALBUMIN 4.6 06/25/2023 0904   ALBUMIN 4.1 03/18/2020 0809   AST 19 06/25/2023 0904   ALT 23 06/25/2023 0904   ALKPHOS 103 06/25/2023 0904   BILITOT 0.6 06/25/2023 0904   BILITOT 0.4 03/18/2020 0809   GFRNONAA 100 03/18/2020 0809   GFRNONAA >89 02/24/2016 1603   GFRAA 116 03/18/2020 0809   GFRAA >89 02/24/2016 1603      Latest Ref Rng & Units 06/25/2023    9:04 AM 02/01/2023    9:05 AM 12/20/2022    9:41 AM  BMP  Glucose 70 - 99 mg/dL 860  752  768   BUN 6 - 23 mg/dL 17  20  17    Creatinine 0.40 - 1.50 mg/dL 9.17  9.07  9.27   Sodium 135 - 145 mEq/L 142  139  138   Potassium 3.5 - 5.1 mEq/L 4.2  4.6  4.2   Chloride 96 - 112 mEq/L 102  104  99   CO2 19 - 32 mEq/L 28  28  28    Calcium  8.4 - 10.5 mg/dL 8.8  8.3  8.7        Component Value Date/Time   WBC 75.0 (HH) 05/21/2019 0640   RBC 4.24 05/21/2019 0640   HGB 11.3 (L) 05/21/2019 0640   HCT 37.8 (L) 05/21/2019 0640   PLT 207 05/21/2019 0640   MCV 89.2 05/21/2019 0640   MCV 83.7 12/10/2017 1608   MCH 26.7 05/21/2019 0640   MCHC 29.9 (L) 05/21/2019 0640   RDW 15.1 05/21/2019 0640   LYMPHSABS 68.2 (H) 05/21/2019 0640   MONOABS 0.8 05/21/2019 0640   EOSABS 0.0 05/21/2019 0640   BASOSABS 0.1 05/21/2019 0640     Parts of this note may have been dictated using voice recognition software. There may be variances in spelling and vocabulary which are unintentional. Not all errors are proofread. Please notify the dino if any discrepancies are noted or if the meaning of any statement is not  clear.

## 2023-12-18 ENCOUNTER — Telehealth: Admitting: Adult Health

## 2023-12-18 ENCOUNTER — Encounter: Payer: Self-pay | Admitting: "Endocrinology

## 2023-12-18 ENCOUNTER — Encounter: Payer: Self-pay | Admitting: Family Medicine

## 2023-12-18 ENCOUNTER — Telehealth: Payer: BC Managed Care – PPO | Admitting: Adult Health

## 2023-12-18 DIAGNOSIS — G4733 Obstructive sleep apnea (adult) (pediatric): Secondary | ICD-10-CM | POA: Diagnosis not present

## 2023-12-18 NOTE — Progress Notes (Unsigned)
 PATIENT: Henry Hinton. DOB: 02/28/1961  REASON FOR VISIT: follow up HISTORY FROM: patient PRIMARY NEUROLOGIST: Dr. Buck  Virtual Visit via Video Note  I connected with Henry Hinton. on 12/18/23 at  3:30 PM EDT by a video enabled telemedicine application located remotely at [facility] and verified that I am speaking with the correct person using two identifiers who was located at their own home in [place].   I discussed the limitations of evaluation and management by telemedicine and the availability of in person appointments. The patient expressed understanding and agreed to proceed.   PATIENT: Henry Hinton. DOB: [birth year]  REASON FOR VISIT: follow up HISTORY FROM: patient  HISTORY OF PRESENT ILLNESS: Today 12/18/23:  Henry Hinton. is a 63 y.o. male with a history of obstructive sleep apnea on CPAP. Returns today for follow-up.  He reports that the CPAP is working well for him.  We discussed the potential mask refitting but he deferred for now.  He currently has the nasal pillows. download is below     12/12/22: Henry Hinton. is a 63 y.o. male with a history of OSA on CPAP. Returns today for follow-up.  Overall the CPAP is working well.  Occasionally he will have soreness around nose.  Currently wearing the nasal pillows.  He states that he will try different size.  If this does not work we can consider a DreamWear mask.  His download is below        REVIEW OF SYSTEMS: Out of a complete 14 system review of symptoms, the patient complains only of the following symptoms, and all other reviewed systems are negative.  ALLERGIES: Allergies  Allergen Reactions   Adhesive [Tape] Other (See Comments)    Blisters and rash at site   Other     HOME MEDICATIONS: Outpatient Medications Prior to Visit  Medication Sig Dispense Refill   Ascorbic Acid  (VITAMIN C) 1000 MG tablet Take 1,000 mg by mouth daily.     aspirin  EC 81 MG  tablet Take 81 mg by mouth daily.     Continuous Glucose Receiver (DEXCOM G7 RECEIVER) DEVI Use to monitor glucose 1 each 0   Continuous Glucose Sensor (DEXCOM G7 SENSOR) MISC Apply to skin as directed every 10 days. 9 each 0   Empagliflozin -metFORMIN  HCl (SYNJARDY ) 12.08-998 MG TABS Take 1 tablet by mouth 2 (two) times daily. 60 tablet 3   ferrous sulfate  325 (65 FE) MG tablet Take 325 mg by mouth daily with breakfast.     fluticasone  (FLONASE ) 50 MCG/ACT nasal spray Place 2 sprays into both nostrils daily. 16 g 0   furosemide  (LASIX ) 80 MG tablet TAKE 1 TABLET BY MOUTH TWICE A DAY 180 tablet 1   ibuprofen  (ADVIL ,MOTRIN ) 200 MG tablet Take 600 mg by mouth every 6 (six) hours as needed for mild pain.     insulin  glargine (LANTUS  SOLOSTAR) 100 UNIT/ML Solostar Pen INJECT 28 UNITS INTO THE SKIN DAILY. + SLIDING SCALE, MAX DOSE 40 UNITS A DAY 30 mL 0   Insulin  Pen Needle (PEN NEEDLES) 31G X 6 MM MISC 1 Needle by Does not apply route daily. 100 each 0   ipratropium (ATROVENT ) 0.06 % nasal spray Place 1-2 sprays into both nostrils 4 (four) times daily. As needed for nasal congestion 15 mL 5   meloxicam  (MOBIC ) 15 MG tablet Take 15 mg by mouth daily.     meloxicam  (MOBIC ) 15 MG tablet Take  1 tablet by mouth once a day as needed 30 tablet 0   metFORMIN  (GLUCOPHAGE ) 1000 MG tablet TAKE 1 TABLET BY MOUTH TWICE A DAY WITH FOOD (Patient not taking: Reported on 09/13/2023) 180 tablet 1   Misc Natural Products (TRANSFERON) CAPS      Multiple Vitamins-Minerals (MULTIVITAMIN WITH MINERALS) tablet Take 1 tablet by mouth daily.     potassium chloride  SA (KLOR-CON  M) 20 MEQ tablet TAKE 1 TABLET BY MOUTH TWICE A DAY 180 tablet 1   pravastatin  (PRAVACHOL ) 10 MG tablet Take 1 tablet (10 mg total) by mouth daily. 90 tablet 1   tirzepatide  (MOUNJARO ) 10 MG/0.5ML Pen Inject 10 mg into the skin once a week. 2 mL 3   Zinc 15 MG CAPS daily.     No facility-administered medications prior to visit.    PAST MEDICAL  HISTORY: Past Medical History:  Diagnosis Date   Anemia    Arthritis    BMI 50.0-59.9, adult (HCC)    Cellulitis 11/2015   trunk   cll 11/2014   CLL - 11/30/2014- Dr. [facility] [place], [place]   DVT (deep venous thrombosis) (HCC) 6/16   Rt calf  took xarelto  for 6 months   Family history of anesthesia complication     father having delirium after anesthesia   Heart murmur    Hx; of as a child   History of kidney stones    x2   Joint pain    Hx: of periodically   Numbness and tingling    Hx: of in right arm   Pneumonia    Sleep apnea    Cpap use- settings 10    PAST SURGICAL HISTORY: Past Surgical History:  Procedure Laterality Date   BACK SURGERY     COLONOSCOPY     Hx: of   COLONOSCOPY N/A 11/23/2015   Procedure: COLONOSCOPY;  Surgeon: Norleen LOISE Kiang, MD;  Location: [facility];  Service: Endoscopy;  Laterality: N/A;   HERNIA REPAIR N/A    Phreesia 03/19/2020   INGUINAL HERNIA REPAIR Left 06/01/2015   Procedure: LEFT INGUINAL HERNIA REPAIR WITH MESH;  Surgeon: Deward Null III, MD;  Location: [facility];  Service: General;  Laterality: Left;   INGUINAL HERNIA REPAIR     INSERTION OF MESH Left 06/01/2015   Procedure: INSERTION OF MESH;  Surgeon: Deward Null III, MD;  Location: [facility];  Service: General;  Laterality: Left;   JOINT REPLACEMENT Bilateral    BTHA   KNEE ARTHROSCOPY Left 07/21/2014   Dr. Jane   LUMBAR LAMINECTOMY  2005   POSTERIOR CERVICAL LAMINECTOMY WITH MET- RX Right 01/21/2013   Procedure: Right Sitting C6-7 Microdiskectomy with Met-rex;  Surgeon: Victory Gens, MD;  Location: [facility];  Service: Neurosurgery;  Laterality: Right;  Right Sitting C6-7 Microdiskectomy with Met-rex   SPINE SURGERY N/A    Phreesia 03/19/2020   STENT PLACEMENT RT URETER ([facility] HX)  10/23/2014   tenosynovitis  2000   Hx: of thumb   TONSILLECTOMY     TOTAL HIP ARTHROPLASTY Bilateral    WISDOM TOOTH EXTRACTION  1974   all 4    FAMILY HISTORY: Family History   Problem Relation Age of Onset   Diabetes Father    Prostate cancer Father        PROSTATE   COPD Maternal Grandmother    Heart disease Maternal Grandfather    Lung cancer Paternal Grandfather        LUNG   Sleep apnea Neg Hx  SOCIAL HISTORY: Social History   Socioeconomic History   Marital status: Divorced    Spouse name: Not on file   Number of children: 0   Years of education: Not on file   Highest education level: Master's degree (e.g., MA, MS, MEng, MEd, MSW, MBA)  Occupational History   Occupation: Psychologist, counselling: [employer]  Tobacco Use   Smoking status: Never   Smokeless tobacco: Former    Types: Chew    Quit date: 05/01/1993  Vaping Use   Vaping status: Never Used  Substance and Sexual Activity   Alcohol use: Yes    Alcohol/week: 8.0 standard drinks of alcohol    Types: 4 Cans of beer, 4 Shots of liquor per week    Comment: BEER AND LIQUOR   Drug use: No   Sexual activity: Not Currently  Other Topics Concern   Not on file  Social History Narrative   Exercise swimming 3-5 times/week for 45 minutes   Social Drivers of Corporate investment banker Strain: Low Risk  (06/18/2023)   Overall Financial Resource Strain (CARDIA)    Difficulty of Paying Living Expenses: Not very hard  Food Insecurity: No Food Insecurity (06/18/2023)   Hunger Vital Sign    Worried About Running Out of Food in the Last Year: Never true    Ran Out of Food in the Last Year: Never true  Transportation Needs: No Transportation Needs (06/18/2023)   PRAPARE - Administrator, Civil Service (Medical): No    Lack of Transportation (Non-Medical): No  Physical Activity: Sufficiently Active (06/18/2023)   Exercise Vital Sign    Days of Exercise per Week: 4 days    Minutes of Exercise per Session: 80 min  Stress: No Stress Concern Present (06/18/2023)   [employer] of Occupational Health - Occupational Stress Questionnaire    Feeling of  Stress : Not at all  Social Connections: Moderately Integrated (06/18/2023)   Social Connection and Isolation Panel    Frequency of Communication with Friends and Family: More than three times a week    Frequency of Social Gatherings with Friends and Family: More than three times a week    Attends Religious Services: More than 4 times per year    Active Member of [employer] or Organizations: Yes    Attends Engineer, structural: More than 4 times per year    Marital Status: Divorced  Catering manager Violence: Not on file      PHYSICAL EXAM Generalized: Well developed, in no acute distress   Neurological examination  Mentation: Alert oriented to time, place, history taking. Follows all commands speech and language fluent Cranial nerve II-XII: Facial symmetry noted  DIAGNOSTIC DATA (LABS, IMAGING, TESTING) - I reviewed patient records, labs, notes, testing and imaging myself where available.  Lab Results  Component Value Date   WBC 75.0 (HH) 05/21/2019   HGB 11.3 (L) 05/21/2019   HCT 37.8 (L) 05/21/2019   MCV 89.2 05/21/2019   PLT 207 05/21/2019      Component Value Date/Time   NA 142 06/25/2023 0904   NA 138 03/18/2020 0809   K 4.2 06/25/2023 0904   CL 102 06/25/2023 0904   CO2 28 06/25/2023 0904   GLUCOSE 139 (H) 06/25/2023 0904   BUN 17 06/25/2023 0904   BUN 13 03/18/2020 0809   CREATININE 0.82 06/25/2023 0904   CREATININE 0.71 02/24/2016 1603   CALCIUM  8.8 06/25/2023 0904   PROT 7.5 06/25/2023  0904   PROT 6.4 03/18/2020 0809   ALBUMIN 4.6 06/25/2023 0904   ALBUMIN 4.1 03/18/2020 0809   AST 19 06/25/2023 0904   ALT 23 06/25/2023 0904   ALKPHOS 103 06/25/2023 0904   BILITOT 0.6 06/25/2023 0904   BILITOT 0.4 03/18/2020 0809   GFRNONAA 100 03/18/2020 0809   GFRNONAA >89 02/24/2016 1603   GFRAA 116 03/18/2020 0809   GFRAA >89 02/24/2016 1603   Lab Results  Component Value Date   CHOL 174 06/25/2023   HDL 55.30 06/25/2023   LDLCALC 95 06/25/2023   TRIG  120.0 06/25/2023   CHOLHDL 3 06/25/2023   Lab Results  Component Value Date   HGBA1C 6.6 (A) 09/13/2023   No results found for: VITAMINB12 Lab Results  Component Value Date   TSH 3.270 03/18/2020      ASSESSMENT AND PLAN 63 y.o. year old male  has a past medical history of Anemia, Arthritis, BMI 50.0-59.9, adult (HCC), Cellulitis (11/2015), cll (11/2014), DVT (deep venous thrombosis) (HCC) (6/16), Family history of anesthesia complication, Heart murmur, History of kidney stones, Joint pain, Numbness and tingling, Pneumonia, and Sleep apnea. here with:  OSA on CPAP  CPAP compliance excellent Residual AHI is good Encouraged patient to continue using CPAP nightly and > 4 hours each night Deferred on mask refitting F/U in 1 year or sooner if needed    [name], MSN, NP-C 12/18/2023, 3:26 PM [facility] [address] [place], [place] [address] [phone]  The patient's condition requires frequent monitoring and adjustments in the treatment plan, reflecting the ongoing complexity of care.  This provider is the continuing focal point for all needed services for this condition.

## 2023-12-18 NOTE — Telephone Encounter (Signed)
 Patient is questioning of a urine is needed to be dropped off and if so if he can come in to complete this Thursday?

## 2023-12-18 NOTE — Telephone Encounter (Signed)
 Please advise, thank you.

## 2023-12-18 NOTE — Telephone Encounter (Signed)
 Please schedule visit today or tomorrow if possible for eval and testing. Thanks.

## 2023-12-19 ENCOUNTER — Ambulatory Visit: Admitting: Family Medicine

## 2023-12-19 ENCOUNTER — Encounter: Payer: Self-pay | Admitting: Family Medicine

## 2023-12-19 ENCOUNTER — Other Ambulatory Visit (HOSPITAL_BASED_OUTPATIENT_CLINIC_OR_DEPARTMENT_OTHER): Payer: Self-pay

## 2023-12-19 VITALS — BP 130/72 | HR 69 | Temp 97.7°F | Resp 16 | Ht 69.0 in | Wt 322.2 lb

## 2023-12-19 DIAGNOSIS — R300 Dysuria: Secondary | ICD-10-CM | POA: Diagnosis not present

## 2023-12-19 LAB — POCT URINALYSIS DIPSTICK
Bilirubin, UA: NEGATIVE
Blood, UA: NEGATIVE
Glucose, UA: POSITIVE — AB
Ketones, UA: NEGATIVE
Leukocytes, UA: NEGATIVE
Nitrite, UA: NEGATIVE
Protein, UA: NEGATIVE
Spec Grav, UA: 1.025 (ref 1.010–1.025)
Urobilinogen, UA: 0.2 U/dL
pH, UA: 6 (ref 5.0–8.0)

## 2023-12-19 MED ORDER — CEPHALEXIN 500 MG PO CAPS
500.0000 mg | ORAL_CAPSULE | Freq: Two times a day (BID) | ORAL | 0 refills | Status: DC
  Filled 2023-12-19: qty 10, 5d supply, fill #0

## 2023-12-19 NOTE — Telephone Encounter (Signed)
 Patient was seen for office visit this morning by Dr. Mahlon.

## 2023-12-19 NOTE — Patient Instructions (Signed)
 Continue using CPAP nightly and greater than 4 hours each night Can consider a different mask in the future  If your symptoms worsen or you develop new symptoms please let us  know.

## 2023-12-19 NOTE — Progress Notes (Signed)
   Subjective:    Patient ID: Henry Levander Nicholaus Mickey., male    DOB: November 11, 1960, 63 y.o.   MRN: [id number]  HPI Dysuria- pt reports he injured his back swimming and was given Prednisone .  4 days into Prednisone  developed pain w/ urination.  Pain described as burning.  Urine is dark despite drinking a lot of water.  Has had some increased frequency.  Pt has sensation of bladder fullness.  Urinating feels like 'a relief'.  Pt reports this feels similar to prior UTI.     Review of Systems For ROS see HPI     Objective:   Physical Exam Vitals reviewed.  Constitutional:      Appearance: Normal appearance. He is obese. He is not ill-appearing.  HENT:     Head: Normocephalic and atraumatic.  Eyes:     Extraocular Movements: Extraocular movements intact.     Conjunctiva/sclera: Conjunctivae normal.  Abdominal:     General: There is no distension.     Palpations: Abdomen is soft.     Tenderness: There is no abdominal tenderness. There is no right CVA tenderness, left CVA tenderness, guarding or rebound.  Skin:    General: Skin is warm and dry.  Neurological:     General: No focal deficit present.     Mental Status: He is alert and oriented to person, place, and time.  Psychiatric:        Mood and Affect: Mood normal.        Behavior: Behavior normal.        Thought Content: Thought content normal.           Assessment & Plan:  Dysuria- new.  Pt reports sxs started while on Prednisone .  Tried to determine if burning was internal (infxn) or external (possibly yeast) and pt reports sxs are more internal.  Feels similar to previous UTI.  Will start Keflex  and send urine for cx.  Pt expressed understanding and is in agreement w/ plan.

## 2023-12-19 NOTE — Patient Instructions (Signed)
 Follow up as needed or as scheduled START the Cephalexin  twice daily Continue to drink LOTS of water We'll notify you of your culture results and make any changes if needed Call with any questions or concerns Hang in there!!!

## 2023-12-20 LAB — URINE CULTURE
MICRO NUMBER:: 16858841
Result:: NO GROWTH
SPECIMEN QUALITY:: ADEQUATE

## 2023-12-21 ENCOUNTER — Ambulatory Visit: Payer: Self-pay | Admitting: Family Medicine

## 2023-12-21 DIAGNOSIS — E1165 Type 2 diabetes mellitus with hyperglycemia: Secondary | ICD-10-CM

## 2023-12-21 NOTE — Telephone Encounter (Signed)
 Patient replied and is looking for advice.  I have never had a positive glucose result in my urine. Im very concerned about that result. Still have some burning at times but no increased frequency or urgency. I guess I'll drink more water

## 2023-12-27 NOTE — Telephone Encounter (Signed)
 Copied from CRM [id number]. Topic: Clinical - Request for Lab/Test Order >> Dec 26, 2023  4:56 PM Charolett L wrote: Reason for CRM: patient would like a lab order request put in so that he can do his labs prior to the appt 10/09

## 2023-12-28 ENCOUNTER — Encounter: Payer: 59 | Admitting: Family Medicine

## 2023-12-28 NOTE — Telephone Encounter (Signed)
 Not sure when he is planning on having the labs ordered by Dr. [name] but these would be typically the labs I check.  If he has those done prior to October visit, that is fine, or we may be able to defer labs at his October visit depending on timing of those labs.

## 2024-01-01 NOTE — Telephone Encounter (Signed)
 I am fine with the same labs that were pended by Dr. [name].  We can reorder those under my name for diabetes that way I will receive results, or can leave them as is, and Dr. Motwani will receive results.  Either way is fine with me.

## 2024-01-02 NOTE — Telephone Encounter (Signed)
 I have reordered labs from Dr. [name] and scheduled patient for a lab visit on 9/16 at 8:15.

## 2024-01-02 NOTE — Addendum Note (Signed)
 Addended by: RANDINE BERYL SAUNDERS on: 01/02/2024 08:44 AM   Modules accepted: Orders

## 2024-01-04 ENCOUNTER — Other Ambulatory Visit (HOSPITAL_BASED_OUTPATIENT_CLINIC_OR_DEPARTMENT_OTHER): Payer: Self-pay

## 2024-01-15 ENCOUNTER — Other Ambulatory Visit (INDEPENDENT_AMBULATORY_CARE_PROVIDER_SITE_OTHER)

## 2024-01-15 DIAGNOSIS — E1165 Type 2 diabetes mellitus with hyperglycemia: Secondary | ICD-10-CM

## 2024-01-15 LAB — LIPID PANEL
Cholesterol: 189 mg/dL (ref 0–200)
HDL: 51.3 mg/dL (ref >39.00)
LDL Cholesterol: 114 mg/dL — ABNORMAL HIGH (ref 0–99)
NonHDL: 137.99
Total CHOL/HDL Ratio: 4
Triglycerides: 120 mg/dL (ref 0.0–149.0)
VLDL: 24 mg/dL (ref 0.0–40.0)

## 2024-01-15 LAB — MICROALBUMIN / CREATININE URINE RATIO
Creatinine,U: 59.4 mg/dL
Microalb Creat Ratio: UNDETERMINED mg/g (ref 0.0–30.0)
Microalb, Ur: <0.7 mg/dL

## 2024-01-15 LAB — HEMOGLOBIN A1C: Hgb A1c MFr Bld: 7.1 % — ABNORMAL HIGH (ref 4.6–6.5)

## 2024-01-16 LAB — COMPLETE METABOLIC PANEL WITHOUT GFR
AG Ratio: 2.2 (calc) (ref 1.0–2.5)
ALT: 19 U/L (ref 9–46)
AST: 16 U/L (ref 10–35)
Albumin: 4.3 g/dL (ref 3.6–5.1)
Alkaline phosphatase (APISO): 92 U/L (ref 35–144)
BUN: 19 mg/dL (ref 7–25)
CO2: 30 mmol/L (ref 20–32)
Calcium: 8.9 mg/dL (ref 8.6–10.3)
Chloride: 104 mmol/L (ref 98–110)
Creat: 0.87 mg/dL (ref 0.70–1.35)
Globulin: 2 g/dL (ref 1.9–3.7)
Glucose, Bld: 114 mg/dL — ABNORMAL HIGH (ref 65–99)
Potassium: 4.8 mmol/L (ref 3.5–5.3)
Sodium: 141 mmol/L (ref 135–146)
Total Bilirubin: 0.5 mg/dL (ref 0.2–1.2)
Total Protein: 6.3 g/dL (ref 6.1–8.1)

## 2024-01-18 ENCOUNTER — Ambulatory Visit: Payer: Self-pay | Admitting: Family Medicine

## 2024-01-24 ENCOUNTER — Encounter: Admitting: Family Medicine

## 2024-01-27 ENCOUNTER — Other Ambulatory Visit: Payer: Self-pay | Admitting: "Endocrinology

## 2024-01-27 ENCOUNTER — Other Ambulatory Visit: Payer: Self-pay | Admitting: Family Medicine

## 2024-01-28 ENCOUNTER — Other Ambulatory Visit (HOSPITAL_BASED_OUTPATIENT_CLINIC_OR_DEPARTMENT_OTHER): Payer: Self-pay

## 2024-01-28 ENCOUNTER — Other Ambulatory Visit: Payer: Self-pay

## 2024-01-28 MED ORDER — SYNJARDY 12.5-1000 MG PO TABS
1.0000 | ORAL_TABLET | Freq: Two times a day (BID) | ORAL | 3 refills | Status: DC
  Filled 2024-01-28: qty 60, 30d supply, fill #0
  Filled 2024-02-24: qty 60, 30d supply, fill #1
  Filled 2024-03-24: qty 60, 30d supply, fill #2
  Filled 2024-04-23: qty 60, 30d supply, fill #3

## 2024-01-28 MED ORDER — DEXCOM G7 SENSOR MISC
1.0000 | 0 refills | Status: DC
  Filled 2024-01-28 – 2024-01-29 (×2): qty 9, 90d supply, fill #0

## 2024-01-28 NOTE — Telephone Encounter (Signed)
 Requested Prescriptions   Pending Prescriptions Disp Refills   Empagliflozin -metFORMIN  HCl (SYNJARDY ) 12.08-998 MG TABS 60 tablet 3    Sig: Take 1 tablet by mouth 2 (two) times daily.

## 2024-01-29 ENCOUNTER — Other Ambulatory Visit (HOSPITAL_BASED_OUTPATIENT_CLINIC_OR_DEPARTMENT_OTHER): Payer: Self-pay

## 2024-02-07 ENCOUNTER — Encounter: Payer: Self-pay | Admitting: Family Medicine

## 2024-02-07 ENCOUNTER — Other Ambulatory Visit (HOSPITAL_BASED_OUTPATIENT_CLINIC_OR_DEPARTMENT_OTHER): Payer: Self-pay

## 2024-02-07 ENCOUNTER — Ambulatory Visit (INDEPENDENT_AMBULATORY_CARE_PROVIDER_SITE_OTHER): Admitting: Family Medicine

## 2024-02-07 VITALS — BP 126/72 | HR 78 | Temp 97.9°F | Resp 22 | Ht 69.0 in | Wt 327.6 lb

## 2024-02-07 DIAGNOSIS — R600 Localized edema: Secondary | ICD-10-CM

## 2024-02-07 DIAGNOSIS — R0981 Nasal congestion: Secondary | ICD-10-CM

## 2024-02-07 DIAGNOSIS — Z0000 Encounter for general adult medical examination without abnormal findings: Secondary | ICD-10-CM

## 2024-02-07 MED ORDER — POTASSIUM CHLORIDE CRYS ER 20 MEQ PO TBCR
20.0000 meq | EXTENDED_RELEASE_TABLET | Freq: Two times a day (BID) | ORAL | 1 refills | Status: DC
  Filled 2024-02-07: qty 180, 90d supply, fill #0
  Filled 2024-05-04 – 2024-05-15 (×3): qty 180, 90d supply, fill #1

## 2024-02-07 MED ORDER — IPRATROPIUM BROMIDE 0.06 % NA SOLN
1.0000 | Freq: Four times a day (QID) | NASAL | 5 refills | Status: AC
  Filled 2024-02-07: qty 15, 10d supply, fill #0
  Filled 2024-03-13: qty 15, 10d supply, fill #1
  Filled 2024-05-13: qty 15, 10d supply, fill #2

## 2024-02-07 NOTE — Progress Notes (Signed)
 Subjective:  Patient ID: Henry Hinton., male    DOB: 1961/04/06  Age: 63 y.o. MRN: [id number]  CC:  Chief Complaint  Patient presents with   Annual Exam     No questions or concerns.     HPI Henry Hinton. presents for Annual Exam  No health changes.  Back injury this summer - followed by Dr. [name] at [facility]. Treated with HEP, prednisone , Mobic , had MRI. Doing better.   PCP, new Hematology, Dr. [name], CLL, office visit September 17.  WBC 123.  Hemoglobin and platelets are stable, no B symptoms or adenopathy.  Advised to monitor for B like symptoms.  Consideration of treatment if further elevation of WBC, 9-month follow-up.  6-week follow-up labs. Sleep specialist, [facility], visit with [name] August 19.  OSA on CPAP.  Excellent compliance, residual AHI was good.  1 year follow-up. Endocrinology, Dr. [name] type 2 diabetes - last visit on 8/18.  Lab Results  Component Value Date   HGBA1C 7.1 (H) 01/15/2024  Mounjaro  10mg  weekly, lantus  22u in the morning, synjardy  12.5/1000mg  BID.  Pravastatin  tolerated better than lipitor -taking 4 days per week.    Chronic pedal edema, Lasix  80 mg twice daily, potassium twice daily. Saw nephrology in June.  Stable with 80mg  in am, 40mg  in evening. Swelling managed.  Plans on compression stockings - plans to have measured at location in [place].  Lab Results  Component Value Date   NA 141 01/15/2024   K 4.8 01/15/2024   CL 104 01/15/2024   CO2 30 01/15/2024   History of right inguinal hernia, chronic, option to be with specialist in the past deferred.no recent symptoms.   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      02/07/2024    2:04 PM 06/25/2023    8:34 AM 12/20/2022    8:55 AM 07/19/2022    8:26 AM 06/19/2022    9:20 AM  Depression screen PHQ 2/9  Decreased Interest 0 0 0 0 0  Down, Depressed, Hopeless 0 0 0 0 0  PHQ - 2 Score 0 0 0 0 0   Altered sleeping 0 0 0 0   Tired, decreased energy 0 0 0 0   Change in appetite 0 0 0 0   Feeling bad or failure about yourself  0 0 0 0   Trouble concentrating 0 0 0 0   Moving slowly or fidgety/restless 0 0 0 0   Suicidal thoughts 0 0 0 0   PHQ-9 Score 0 0 0 0   Difficult doing work/chores Not difficult at all        Health Maintenance  Topic Date Due   Zoster Vaccines- Shingrix (1 of 2) 05/09/2024 (Originally 06/28/1979)   Influenza Vaccine  07/29/2024 (Originally 11/30/2023)   Pneumococcal Vaccine: 50+ Years (2 of 2 - PCV) 02/06/2025 (Originally 03/05/2019)   OPHTHALMOLOGY EXAM  06/19/2024   HEMOGLOBIN A1C  07/14/2024   Diabetic kidney evaluation - eGFR measurement  01/14/2025   Diabetic kidney evaluation - Urine ACR  01/14/2025   FOOT EXAM  02/06/2025   Colonoscopy  11/22/2025   DTaP/Tdap/Td (3 - Td or Tdap) 02/28/2027   Hepatitis C Screening  Completed   HIV Screening  Completed   Hepatitis B Vaccines 19-59 Average Risk  Aged Out   HPV VACCINES  Aged Out   Meningococcal B Vaccine  Aged Out   COVID-19 Vaccine  Discontinued  Colonoscopy:  2017 - Dr. [name]. Repeat 10 years.  Prostate: followed by Urology.  Dr. [name], 06/27/23. PSA screening at their office with hx of nephrolithiasis. PSA 0.40 on 06/27/23.  Lab Results  Component Value Date   PSA1 0.4 03/18/2020   PSA1 0.3 03/20/2019   PSA1 0.4 03/04/2018   PSA 0.3 02/24/2016   PSA 0.38 02/15/2015   PSA 0.39 11/25/2013      Immunization History  Administered Date(s) Administered   Influenza,inj,Quad PF,6+ Mos 02/27/2017, 03/04/2018, 03/20/2019, 03/14/2021, 04/05/2022   Influenza-Unspecified 02/27/2017, 03/04/2018, 03/20/2019   Janssen (J&J) SARS-COV-2 Vaccination 12/06/2019   PPD Test 01/02/2023   Pneumococcal Polysaccharide-23 03/04/2018   Tdap 07/31/2006, 02/27/2017  Flu vaccine: declines Prevnar recommended - declines.  Covid booster - declines  No results found. Optho  - visit in August. No changes -  stable.   Dental: every 6 months, appt in November.   Alcohol: once per week. Less than 1 per day.   Tobacco: none  Exercise: swimming 3 days or more per week. 1.5 miles. 20 miles in September, deep water running, walking.  Same relationship for over 2 years. STI screening declined.   Atrovent  NS for allergies/rhinitis/congestion. Working well.   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   History of DVT (deep vein thrombosis) 08/19/2019   Venous stasis 08/19/2019   Essential hypertension 08/19/2019   Acute hypoxemic respiratory failure due to COVID-19 [facility]) 05/18/2019   BPH without obstruction/lower urinary tract symptoms 03/14/2018   Family history of prostate cancer 03/14/2018   History of nephrolithiasis 03/14/2018   Microscopic hematuria 03/14/2018   Diabetes mellitus without complication (HCC) 11/19/2017   Iron deficiency anemia 11/19/2017   Influenza, pneumonia 09/22/2017   Abdominal wall cellulitis 01/22/2017   Insect bite 01/22/2017   Hyperkalemia 01/22/2017   OSA (obstructive sleep apnea) 01/22/2017   Vocal cord paralysis 01/31/2016   Hypercalciuria 01/10/2016   Cellulitis 11/28/2015   Cellulitis of trunk 11/27/2015   Nonspecific abnormal finding in stool contents    Colon cancer screening    Sepsis (HCC) 10/09/2015   Panniculitis 10/09/2015   CLL (chronic lymphocytic leukemia) (HCC) 05/27/2015   Calculi, ureter 10/27/2014   Calculus of kidney 10/27/2014   Leukocytosis (leucocytosis) 08/29/2014   HNP (herniated nucleus pulposus), cervical 01/21/2013   bilat hip replacement 04/08/2012   Morbid obesity (HCC) 04/08/2012   Edema 04/08/2012   Past Medical History:  Diagnosis Date   Anemia    Arthritis    BMI 50.0-59.9, adult (HCC)    Cellulitis 11/2015   trunk   cll 11/2014    CLL - 11/30/2014- Dr. [facility] [place], [place]   Diabetes mellitus without complication (HCC) 09/2017   DVT (deep venous thrombosis) (HCC) 09/2014   Rt calf  took xarelto  for 6 months   Family history of anesthesia complication     father having delirium after anesthesia   Heart murmur    Hx; of as a child   History of kidney stones    x2   Joint pain    Hx: of periodically   Numbness and tingling    Hx: of in right arm   Pneumonia    Sleep apnea    Cpap use- settings 10   Past Surgical History:  Procedure Laterality Date   BACK SURGERY  COLONOSCOPY     Hx: of   COLONOSCOPY N/A 11/23/2015   Procedure: COLONOSCOPY;  Surgeon: Norleen LOISE Kiang, MD;  Location: [facility];  Service: Endoscopy;  Laterality: N/A;   HERNIA REPAIR N/A    Phreesia 03/19/2020   INGUINAL HERNIA REPAIR Left 06/01/2015   Procedure: LEFT INGUINAL HERNIA REPAIR WITH MESH;  Surgeon: Deward Null III, MD;  Location: [facility];  Service: General;  Laterality: Left;   INGUINAL HERNIA REPAIR     INSERTION OF MESH Left 06/01/2015   Procedure: INSERTION OF MESH;  Surgeon: Deward Null III, MD;  Location: [facility];  Service: General;  Laterality: Left;   JOINT REPLACEMENT Bilateral    BTHA   KNEE ARTHROSCOPY Left 07/21/2014   Dr. Jane   LUMBAR LAMINECTOMY  2005   POSTERIOR CERVICAL LAMINECTOMY WITH MET- RX Right 01/21/2013   Procedure: Right Sitting C6-7 Microdiskectomy with Met-rex;  Surgeon: Victory Gens, MD;  Location: [facility];  Service: Neurosurgery;  Laterality: Right;  Right Sitting C6-7 Microdiskectomy with Met-rex   SPINE SURGERY N/A    Phreesia 03/19/2020   STENT PLACEMENT RT URETER ([facility] HX)  10/23/2014   tenosynovitis  2000   Hx: of thumb   TONSILLECTOMY     TOTAL HIP ARTHROPLASTY Bilateral    WISDOM TOOTH EXTRACTION  1974   all 4   Allergies  Allergen Reactions   Adhesive [Tape] Other (See Comments)    Blisters and rash at site   Other    Prior to Admission medications   Medication  Sig Start Date End Date Taking? Authorizing Provider  Ascorbic Acid  (VITAMIN C) 1000 MG tablet Take 1,000 mg by mouth daily.   Yes [provider]  aspirin  EC 81 MG tablet Take 81 mg by mouth daily.   Yes [provider]  Continuous Glucose Receiver (DEXCOM G7 RECEIVER) DEVI Use to monitor glucose 03/05/23  Yes Motwani, Komal, MD  Continuous Glucose Sensor (DEXCOM G7 SENSOR) MISC Apply to skin as directed every 10 days. 01/28/24  Yes Levora Reyes SAUNDERS, MD  Empagliflozin -metFORMIN  HCl (SYNJARDY ) 12.08-998 MG TABS Take 1 tablet by mouth 2 (two) times daily. 01/28/24  Yes Motwani, Komal, MD  ferrous sulfate  325 (65 FE) MG tablet Take 325 mg by mouth daily with breakfast.   Yes [provider]  fluticasone  (FLONASE ) 50 MCG/ACT nasal spray Place 2 sprays into both nostrils daily. 05/19/21  Yes Moishe Chiquita HERO, NP  furosemide  (LASIX ) 80 MG tablet TAKE 1 TABLET BY MOUTH TWICE A DAY 11/12/23  Yes Levora Reyes SAUNDERS, MD  ibuprofen  (ADVIL ,MOTRIN ) 200 MG tablet Take 600 mg by mouth every 6 (six) hours as needed for mild pain.   Yes [provider]  insulin  glargine (LANTUS  SOLOSTAR) 100 UNIT/ML Solostar Pen INJECT 28 UNITS INTO THE SKIN DAILY. + SLIDING SCALE, MAX DOSE 40 UNITS A DAY 09/14/23  Yes Motwani, Komal, MD  Insulin  Pen Needle (PEN NEEDLES) 31G X 6 MM MISC 1 Needle by Does not apply route daily. 02/08/23  Yes Motwani, Komal, MD  ipratropium (ATROVENT ) 0.06 % nasal spray Place 1-2 sprays into both nostrils 4 (four) times daily. As needed for nasal congestion 09/13/21  Yes Levora Reyes SAUNDERS, MD  meloxicam  (MOBIC ) 15 MG tablet Take 1 tablet by mouth once a day as needed 12/11/23  Yes   Misc Natural Products (TRANSFERON) CAPS  05/22/19  Yes [provider]  Multiple Vitamins-Minerals (MULTIVITAMIN WITH MINERALS) tablet Take 1 tablet by mouth daily.   Yes [provider]  potassium chloride  SA (KLOR-CON  M) 20 MEQ tablet TAKE 1 TABLET BY MOUTH TWICE A DAY 11/12/23   Yes Levora Reyes SAUNDERS, MD  pravastatin  (PRAVACHOL ) 10 MG tablet Take 1 tablet (10 mg total) by mouth daily. 12/17/23  Yes Motwani, Komal, MD  tirzepatide  (MOUNJARO ) 10 MG/0.5ML Pen Inject 10 mg into the skin once a week. 10/09/23  Yes Motwani, Komal, MD  Zinc 15 MG CAPS daily. 05/22/19  Yes [provider]  cephALEXin  (KEFLEX ) 500 MG capsule Take 1 capsule (500 mg total) by mouth 2 (two) times daily. Patient not taking: Reported on 02/07/2024 12/19/23   Mahlon Comer BRAVO, MD  meloxicam  (MOBIC ) 15 MG tablet Take 15 mg by mouth daily. Patient not taking: Reported on 02/07/2024 07/20/21   [provider]  metFORMIN  (GLUCOPHAGE ) 1000 MG tablet TAKE 1 TABLET BY MOUTH TWICE A DAY WITH FOOD Patient not taking: Reported on 02/07/2024 08/20/23   Levora Reyes SAUNDERS, MD   Social History   Socioeconomic History   Marital status: Divorced    Spouse name: Not on file   Number of children: 0   Years of education: Not on file   Highest education level: Master's degree (e.g., MA, MS, MEng, MEd, MSW, MBA)  Occupational History   Occupation: Psychologist, counselling: [employer]  Tobacco Use   Smoking status: Never   Smokeless tobacco: Former    Types: Chew    Quit date: 05/01/1993  Vaping Use   Vaping status: Never Used  Substance and Sexual Activity   Alcohol use: Yes    Alcohol/week: 2.0 standard drinks of alcohol    Types: 1 Cans of beer, 1 Shots of liquor per week    Comment: BEER AND LIQUOR   Drug use: No   Sexual activity: Yes    Birth control/protection: None  Other Topics Concern   Not on file  Social History Narrative   Exercise swimming 3-5 times/week for 45 minutes   Social Drivers of Corporate investment banker Strain: Low Risk  (12/18/2023)   Overall Financial Resource Strain (CARDIA)    Difficulty of Paying Living Expenses: Not hard at all  Food Insecurity: No Food Insecurity (12/18/2023)   Hunger Vital Sign    Worried About Running Out of  Food in the Last Year: Never true    Ran Out of Food in the Last Year: Never true  Transportation Needs: No Transportation Needs (12/18/2023)   PRAPARE - Administrator, Civil Service (Medical): No    Lack of Transportation (Non-Medical): No  Physical Activity: Sufficiently Active (12/18/2023)   Exercise Vital Sign    Days of Exercise per Week: 5 days    Minutes of Exercise per Session: 60 min  Stress: No Stress Concern Present (12/18/2023)   [employer] of Occupational Health - Occupational Stress Questionnaire    Feeling of Stress: Not at all  Social Connections: Moderately Integrated (12/18/2023)   Social Connection and Isolation Panel    Frequency of Communication with Friends and Family: More than three times a week    Frequency of Social Gatherings with Friends and Family: More than three times a week    Attends Religious Services: 1 to 4 times per year    Active Member of [employer] or Organizations: Yes    Attends Engineer, structural: More than 4 times per year    Marital Status: Divorced  Catering manager Violence: Not on file    Review of  Systems 13 point review of systems per patient health survey noted.  Negative other than as indicated above or in HPI.    Objective:   Vitals:   02/07/24 1357  BP: 126/72  Pulse: 78  Resp: (!) 22  Temp: 97.9 F (36.6 C)  TempSrc: Temporal  SpO2: 96%  Weight: (!) 327 lb 9.6 oz (148.6 kg)  Height: 5' 9 (1.753 m)     Physical Exam Vitals reviewed.  Constitutional:      Appearance: He is well-developed. He is obese. He is not ill-appearing or diaphoretic.  HENT:     Head: Normocephalic and atraumatic.     Right Ear: External ear normal.     Left Ear: External ear normal.  Eyes:     Conjunctiva/sclera: Conjunctivae normal.     Pupils: Pupils are equal, round, and reactive to light.  Neck:     Thyroid: No thyromegaly.  Cardiovascular:     Rate and Rhythm: Normal rate and regular rhythm.     Heart  sounds: Normal heart sounds.  Pulmonary:     Effort: Pulmonary effort is normal. No respiratory distress.     Breath sounds: Normal breath sounds. No wheezing.  Abdominal:     General: There is no distension.     Palpations: Abdomen is soft.     Tenderness: There is no abdominal tenderness.  Musculoskeletal:        General: No tenderness. Normal range of motion.     Cervical back: Normal range of motion and neck supple.  Lymphadenopathy:     Cervical: No cervical adenopathy.  Skin:    General: Skin is warm and dry.  Neurological:     Mental Status: He is alert and oriented to person, place, and time.     Deep Tendon Reflexes: Reflexes are normal and symmetric.  Psychiatric:        Behavior: Behavior normal.        Assessment & Plan:  Mathan Darroch. is a 63 y.o. male . Annual physical exam  - -anticipatory guidance as below in AVS, screening labs above. Health maintenance items as above in HPI discussed/recommended as applicable.   - Continue follow-up with specialists as above diabetes, CLL.  Nasal congestion - Plan: ipratropium (ATROVENT ) 0.06 % nasal spray  - Stable with use of Atrovent , continue same.  Peripheral edema - Plan: potassium chloride  SA (KLOR-CON  M) 20 MEQ tablet  - Well-managed with current dose of furosemide  and potassium.  Continue same.  Option of prescription for compression stockings if needed.  Labs deferred as managed by other specialist as above.  Meds ordered this encounter  Medications   ipratropium (ATROVENT ) 0.06 % nasal spray    Sig: Place 1-2 sprays into both nostrils 4 (four) times daily as needed for nasal congestion.    Dispense:  15 mL    Refill:  5   potassium chloride  SA (KLOR-CON  M) 20 MEQ tablet    Sig: Take 1 tablet (20 mEq total) by mouth 2 (two) times daily.    Dispense:  180 tablet    Refill:  1   Patient Instructions  Thank you for coming in today. No change in medications at this time.  I did not check any labs  today as you have had those performed by other specialist recently.  Keep follow-up with specialist as planned.  Let me know if there are any questions.  If there is a prescription needed for the compression stockings, have them send  a request for what information they need from me.  Good luck with the upcoming travels. Take care!  Preventive Care 25-33 Years Old, Male Preventive care refers to lifestyle choices and visits with your health care provider that can promote health and wellness. Preventive care visits are also called wellness exams. What can I expect for my preventive care visit? Counseling During your preventive care visit, your health care provider may ask about your: Medical history, including: Past medical problems. Family medical history. Current health, including: Emotional well-being. Home life and relationship well-being. Sexual activity. Lifestyle, including: Alcohol, nicotine or tobacco, and drug use. Access to firearms. Diet, exercise, and sleep habits. Safety issues such as seatbelt and bike helmet use. Sunscreen use. Work and work Astronomer. Physical exam Your health care provider will check your: Height and weight. These may be used to calculate your BMI (body mass index). BMI is a measurement that tells if you are at a healthy weight. Waist circumference. This measures the distance around your waistline. This measurement also tells if you are at a healthy weight and may help predict your risk of certain diseases, such as type 2 diabetes and high blood pressure. Heart rate and blood pressure. Body temperature. Skin for abnormal spots. What immunizations do I need?  Vaccines are usually given at various ages, according to a schedule. Your health care provider will recommend vaccines for you based on your age, medical history, and lifestyle or other factors, such as travel or where you work. What tests do I need? Screening Your health care provider may  recommend screening tests for certain conditions. This may include: Lipid and cholesterol levels. Diabetes screening. This is done by checking your blood sugar (glucose) after you have not eaten for a while (fasting). Hepatitis B test. Hepatitis C test. HIV (human immunodeficiency virus) test. STI (sexually transmitted infection) testing, if you are at risk. Lung cancer screening. Prostate cancer screening. Colorectal cancer screening. Talk with your health care provider about your test results, treatment options, and if necessary, the need for more tests. Follow these instructions at home: Eating and drinking  Eat a diet that includes fresh fruits and vegetables, whole grains, lean protein, and low-fat dairy products. Take vitamin and mineral supplements as recommended by your health care provider. Do not drink alcohol if your health care provider tells you not to drink. If you drink alcohol: Limit how much you have to 0-2 drinks a day. Know how much alcohol is in your drink. In the [place] one drink equals one 12 oz bottle of beer (355 mL), one 5 oz glass of wine (148 mL), or one 1 oz glass of hard liquor (44 mL). Lifestyle Brush your teeth every morning and night with fluoride toothpaste. Floss one time each day. Exercise for at least 30 minutes 5 or more days each week. Do not use any products that contain nicotine or tobacco. These products include cigarettes, chewing tobacco, and vaping devices, such as e-cigarettes. If you need help quitting, ask your health care provider. Do not use drugs. If you are sexually active, practice safe sex. Use a condom or other form of protection to prevent STIs. Take aspirin  only as told by your health care provider. Make sure that you understand how much to take and what form to take. Work with your health care provider to find out whether it is safe and beneficial for you to take aspirin  daily. Find healthy ways to manage stress, such  as: Meditation, yoga, or listening to  music. Journaling. Talking to a trusted person. Spending time with friends and family. Minimize exposure to UV radiation to reduce your risk of skin cancer. Safety Always wear your seat belt while driving or riding in a vehicle. Do not drive: If you have been drinking alcohol. Do not ride with someone who has been drinking. When you are tired or distracted. While texting. If you have been using any mind-altering substances or drugs. Wear a helmet and other protective equipment during sports activities. If you have firearms in your house, make sure you follow all gun safety procedures. What's next? Go to your health care provider once a year for an annual wellness visit. Ask your health care provider how often you should have your eyes and teeth checked. Stay up to date on all vaccines. This information is not intended to replace advice given to you by your health care provider. Make sure you discuss any questions you have with your health care provider. Document Revised: 10/13/2020 Document Reviewed: 10/13/2020 Elsevier Patient Education  2024 Elsevier Inc.    Signed,   Reyes Pines, MD [facility], [facility] 02/07/24 3:02 PM

## 2024-02-07 NOTE — Patient Instructions (Signed)
 Thank you for coming in today. No change in medications at this time.  I did not check any labs today as you have had those performed by other specialist recently.  Keep follow-up with specialist as planned.  Let me know if there are any questions.  If there is a prescription needed for the compression stockings, have them send a request for what information they need from me.  Good luck with the upcoming travels. Take care!  Preventive Care 63-46 Years Old, Male Preventive care refers to lifestyle choices and visits with your health care provider that can promote health and wellness. Preventive care visits are also called wellness exams. What can I expect for my preventive care visit? Counseling During your preventive care visit, your health care provider may ask about your: Medical history, including: Past medical problems. Family medical history. Current health, including: Emotional well-being. Home life and relationship well-being. Sexual activity. Lifestyle, including: Alcohol, nicotine or tobacco, and drug use. Access to firearms. Diet, exercise, and sleep habits. Safety issues such as seatbelt and bike helmet use. Sunscreen use. Work and work Astronomer. Physical exam Your health care provider will check your: Height and weight. These may be used to calculate your BMI (body mass index). BMI is a measurement that tells if you are at a healthy weight. Waist circumference. This measures the distance around your waistline. This measurement also tells if you are at a healthy weight and may help predict your risk of certain diseases, such as type 2 diabetes and high blood pressure. Heart rate and blood pressure. Body temperature. Skin for abnormal spots. What immunizations do I need?  Vaccines are usually given at various ages, according to a schedule. Your health care provider will recommend vaccines for you based on your age, medical history, and lifestyle or other factors, such as  travel or where you work. What tests do I need? Screening Your health care provider may recommend screening tests for certain conditions. This may include: Lipid and cholesterol levels. Diabetes screening. This is done by checking your blood sugar (glucose) after you have not eaten for a while (fasting). Hepatitis B test. Hepatitis C test. HIV (human immunodeficiency virus) test. STI (sexually transmitted infection) testing, if you are at risk. Lung cancer screening. Prostate cancer screening. Colorectal cancer screening. Talk with your health care provider about your test results, treatment options, and if necessary, the need for more tests. Follow these instructions at home: Eating and drinking  Eat a diet that includes fresh fruits and vegetables, whole grains, lean protein, and low-fat dairy products. Take vitamin and mineral supplements as recommended by your health care provider. Do not drink alcohol if your health care provider tells you not to drink. If you drink alcohol: Limit how much you have to 0-2 drinks a day. Know how much alcohol is in your drink. In the [place] one drink equals one 12 oz bottle of beer (355 mL), one 5 oz glass of wine (148 mL), or one 1 oz glass of hard liquor (44 mL). Lifestyle Brush your teeth every morning and night with fluoride toothpaste. Floss one time each day. Exercise for at least 30 minutes 5 or more days each week. Do not use any products that contain nicotine or tobacco. These products include cigarettes, chewing tobacco, and vaping devices, such as e-cigarettes. If you need help quitting, ask your health care provider. Do not use drugs. If you are sexually active, practice safe sex. Use a condom or other form of protection to  prevent STIs. Take aspirin  only as told by your health care provider. Make sure that you understand how much to take and what form to take. Work with your health care provider to find out whether it is safe and  beneficial for you to take aspirin  daily. Find healthy ways to manage stress, such as: Meditation, yoga, or listening to music. Journaling. Talking to a trusted person. Spending time with friends and family. Minimize exposure to UV radiation to reduce your risk of skin cancer. Safety Always wear your seat belt while driving or riding in a vehicle. Do not drive: If you have been drinking alcohol. Do not ride with someone who has been drinking. When you are tired or distracted. While texting. If you have been using any mind-altering substances or drugs. Wear a helmet and other protective equipment during sports activities. If you have firearms in your house, make sure you follow all gun safety procedures. What's next? Go to your health care provider once a year for an annual wellness visit. Ask your health care provider how often you should have your eyes and teeth checked. Stay up to date on all vaccines. This information is not intended to replace advice given to you by your health care provider. Make sure you discuss any questions you have with your health care provider. Document Revised: 10/13/2020 Document Reviewed: 10/13/2020 Elsevier Patient Education  2024 [employer].

## 2024-02-10 ENCOUNTER — Encounter: Payer: Self-pay | Admitting: Family Medicine

## 2024-02-11 ENCOUNTER — Other Ambulatory Visit (HOSPITAL_BASED_OUTPATIENT_CLINIC_OR_DEPARTMENT_OTHER): Payer: Self-pay

## 2024-02-11 MED ORDER — DOXYCYCLINE HYCLATE 100 MG PO TABS
100.0000 mg | ORAL_TABLET | Freq: Two times a day (BID) | ORAL | 0 refills | Status: AC
  Filled 2024-02-11: qty 14, 7d supply, fill #0

## 2024-02-11 NOTE — Telephone Encounter (Signed)
 Pt had a 02/07/2024 visit and notes a spot on his skin, requesting treatment recommendations, will you advise given recent visit or should patient have a new visit?

## 2024-02-12 ENCOUNTER — Other Ambulatory Visit (HOSPITAL_BASED_OUTPATIENT_CLINIC_OR_DEPARTMENT_OTHER): Payer: Self-pay

## 2024-02-13 ENCOUNTER — Other Ambulatory Visit: Payer: Self-pay | Admitting: "Endocrinology

## 2024-02-14 ENCOUNTER — Other Ambulatory Visit (HOSPITAL_BASED_OUTPATIENT_CLINIC_OR_DEPARTMENT_OTHER): Payer: Self-pay

## 2024-02-14 MED ORDER — MOUNJARO 10 MG/0.5ML ~~LOC~~ SOAJ
10.0000 mg | SUBCUTANEOUS | 3 refills | Status: AC
  Filled 2024-02-14: qty 2, 28d supply, fill #0
  Filled 2024-02-24 – 2024-03-09 (×4): qty 2, 28d supply, fill #1
  Filled 2024-04-06: qty 2, 28d supply, fill #2
  Filled 2024-05-04: qty 2, 28d supply, fill #3

## 2024-02-15 ENCOUNTER — Other Ambulatory Visit (HOSPITAL_BASED_OUTPATIENT_CLINIC_OR_DEPARTMENT_OTHER): Payer: Self-pay

## 2024-02-25 ENCOUNTER — Other Ambulatory Visit: Payer: Self-pay

## 2024-02-25 ENCOUNTER — Other Ambulatory Visit (HOSPITAL_BASED_OUTPATIENT_CLINIC_OR_DEPARTMENT_OTHER): Payer: Self-pay

## 2024-03-06 ENCOUNTER — Other Ambulatory Visit (HOSPITAL_BASED_OUTPATIENT_CLINIC_OR_DEPARTMENT_OTHER): Payer: Self-pay

## 2024-03-07 ENCOUNTER — Other Ambulatory Visit (HOSPITAL_BASED_OUTPATIENT_CLINIC_OR_DEPARTMENT_OTHER): Payer: Self-pay

## 2024-03-10 ENCOUNTER — Other Ambulatory Visit (HOSPITAL_BASED_OUTPATIENT_CLINIC_OR_DEPARTMENT_OTHER): Payer: Self-pay

## 2024-03-28 ENCOUNTER — Other Ambulatory Visit (HOSPITAL_BASED_OUTPATIENT_CLINIC_OR_DEPARTMENT_OTHER): Payer: Self-pay

## 2024-04-01 ENCOUNTER — Other Ambulatory Visit (HOSPITAL_BASED_OUTPATIENT_CLINIC_OR_DEPARTMENT_OTHER): Payer: Self-pay

## 2024-04-01 MED ORDER — MELOXICAM 15 MG PO TABS
15.0000 mg | ORAL_TABLET | Freq: Every day | ORAL | 0 refills | Status: DC | PRN
  Filled 2024-04-01: qty 30, 30d supply, fill #0

## 2024-04-28 ENCOUNTER — Other Ambulatory Visit (HOSPITAL_BASED_OUTPATIENT_CLINIC_OR_DEPARTMENT_OTHER): Payer: Self-pay

## 2024-04-28 ENCOUNTER — Other Ambulatory Visit: Payer: Self-pay | Admitting: Family Medicine

## 2024-04-28 MED ORDER — DEXCOM G7 SENSOR MISC
1.0000 | 0 refills | Status: AC
  Filled 2024-04-28: qty 9, 90d supply, fill #0

## 2024-04-29 ENCOUNTER — Telehealth: Payer: Self-pay

## 2024-04-29 ENCOUNTER — Other Ambulatory Visit: Payer: Self-pay

## 2024-04-29 ENCOUNTER — Other Ambulatory Visit (HOSPITAL_BASED_OUTPATIENT_CLINIC_OR_DEPARTMENT_OTHER): Payer: Self-pay

## 2024-04-29 ENCOUNTER — Other Ambulatory Visit (HOSPITAL_COMMUNITY): Payer: Self-pay

## 2024-04-29 NOTE — Telephone Encounter (Signed)
 Pharmacy Patient Advocate Encounter   Received notification from [employer] that prior authorization for Dexcom G7 Sensor is required/requested.   Insurance verification completed.   The patient is insured through [facility].   Per test claim: PA required; PA submitted to above mentioned insurance via Latent Key/confirmation #/EOC [id number] Status is pending

## 2024-04-30 ENCOUNTER — Other Ambulatory Visit (HOSPITAL_COMMUNITY): Payer: Self-pay

## 2024-04-30 NOTE — Telephone Encounter (Signed)
 Pharmacy Patient Advocate Encounter  Received notification from [facility] that Prior Authorization for  Dexcom G7 Sensor  has been APPROVED from 04/29/24 to 04/29/25. Unable to obtain price due to refill too soon rejection, last fill date 04/29/24 next available fill date 07/06/24   PA #/Case ID/Reference #: [id number]

## 2024-05-06 ENCOUNTER — Other Ambulatory Visit: Payer: Self-pay

## 2024-05-06 ENCOUNTER — Other Ambulatory Visit (HOSPITAL_BASED_OUTPATIENT_CLINIC_OR_DEPARTMENT_OTHER): Payer: Self-pay

## 2024-05-13 ENCOUNTER — Other Ambulatory Visit: Payer: Self-pay | Admitting: "Endocrinology

## 2024-05-13 ENCOUNTER — Other Ambulatory Visit (HOSPITAL_BASED_OUTPATIENT_CLINIC_OR_DEPARTMENT_OTHER): Payer: Self-pay

## 2024-05-13 ENCOUNTER — Other Ambulatory Visit: Payer: Self-pay

## 2024-05-13 MED ORDER — PRAVASTATIN SODIUM 10 MG PO TABS
10.0000 mg | ORAL_TABLET | Freq: Every day | ORAL | 1 refills | Status: AC
  Filled 2024-05-13 – 2024-05-21 (×3): qty 90, 90d supply, fill #0

## 2024-05-13 NOTE — Telephone Encounter (Signed)
 Requested Prescriptions   Pending Prescriptions Disp Refills   pravastatin  (PRAVACHOL ) 10 MG tablet 90 tablet 1    Sig: Take 1 tablet (10 mg total) by mouth daily.

## 2024-05-15 ENCOUNTER — Other Ambulatory Visit (HOSPITAL_BASED_OUTPATIENT_CLINIC_OR_DEPARTMENT_OTHER): Payer: Self-pay

## 2024-05-15 MED ORDER — MELOXICAM 15 MG PO TABS
15.0000 mg | ORAL_TABLET | Freq: Every day | ORAL | 0 refills | Status: AC | PRN
  Filled 2024-05-15: qty 30, 30d supply, fill #0

## 2024-05-16 ENCOUNTER — Other Ambulatory Visit (HOSPITAL_BASED_OUTPATIENT_CLINIC_OR_DEPARTMENT_OTHER): Payer: Self-pay

## 2024-05-16 ENCOUNTER — Other Ambulatory Visit: Payer: Self-pay

## 2024-05-17 ENCOUNTER — Encounter: Payer: Self-pay | Admitting: Family Medicine

## 2024-05-17 DIAGNOSIS — R600 Localized edema: Secondary | ICD-10-CM

## 2024-05-19 ENCOUNTER — Other Ambulatory Visit (HOSPITAL_BASED_OUTPATIENT_CLINIC_OR_DEPARTMENT_OTHER): Payer: Self-pay

## 2024-05-19 MED ORDER — POTASSIUM CHLORIDE CRYS ER 20 MEQ PO TBCR
20.0000 meq | EXTENDED_RELEASE_TABLET | Freq: Two times a day (BID) | ORAL | 1 refills | Status: AC
  Filled 2024-05-19: qty 180, 90d supply, fill #0

## 2024-05-21 ENCOUNTER — Other Ambulatory Visit: Payer: Self-pay | Admitting: "Endocrinology

## 2024-05-21 ENCOUNTER — Other Ambulatory Visit (HOSPITAL_BASED_OUTPATIENT_CLINIC_OR_DEPARTMENT_OTHER): Payer: Self-pay

## 2024-05-22 ENCOUNTER — Other Ambulatory Visit (HOSPITAL_BASED_OUTPATIENT_CLINIC_OR_DEPARTMENT_OTHER): Payer: Self-pay

## 2024-05-22 MED ORDER — SYNJARDY 12.5-1000 MG PO TABS
1.0000 | ORAL_TABLET | Freq: Two times a day (BID) | ORAL | 0 refills | Status: AC
  Filled 2024-05-22: qty 60, 30d supply, fill #0

## 2024-08-15 ENCOUNTER — Ambulatory Visit: Admitting: Family Medicine

## 2024-12-16 ENCOUNTER — Telehealth: Admitting: Adult Health
